# Patient Record
Sex: Male | Born: 1945 | Race: White | Hispanic: No | Smoking: Former smoker
Health system: Southern US, Community
[De-identification: ages and names within clinical notes are randomized; demographics above are authoritative.]

## PROBLEM LIST (undated history)

## (undated) DIAGNOSIS — M109 Gout, unspecified: Secondary | ICD-10-CM

## (undated) DIAGNOSIS — R0600 Dyspnea, unspecified: Secondary | ICD-10-CM

## (undated) DIAGNOSIS — N400 Enlarged prostate without lower urinary tract symptoms: Secondary | ICD-10-CM

## (undated) DIAGNOSIS — I2510 Atherosclerotic heart disease of native coronary artery without angina pectoris: Secondary | ICD-10-CM

## (undated) DIAGNOSIS — I499 Cardiac arrhythmia, unspecified: Secondary | ICD-10-CM

## (undated) DIAGNOSIS — S7290XA Unspecified fracture of unspecified femur, initial encounter for closed fracture: Secondary | ICD-10-CM

## (undated) DIAGNOSIS — Z87442 Personal history of urinary calculi: Secondary | ICD-10-CM

## (undated) DIAGNOSIS — K219 Gastro-esophageal reflux disease without esophagitis: Secondary | ICD-10-CM

## (undated) DIAGNOSIS — M1990 Unspecified osteoarthritis, unspecified site: Secondary | ICD-10-CM

## (undated) DIAGNOSIS — C801 Malignant (primary) neoplasm, unspecified: Secondary | ICD-10-CM

## (undated) DIAGNOSIS — I4891 Unspecified atrial fibrillation: Secondary | ICD-10-CM

## (undated) DIAGNOSIS — L989 Disorder of the skin and subcutaneous tissue, unspecified: Secondary | ICD-10-CM

## (undated) DIAGNOSIS — Z8739 Personal history of other diseases of the musculoskeletal system and connective tissue: Secondary | ICD-10-CM

## (undated) DIAGNOSIS — I4819 Other persistent atrial fibrillation: Secondary | ICD-10-CM

## (undated) DIAGNOSIS — N289 Disorder of kidney and ureter, unspecified: Secondary | ICD-10-CM

## (undated) DIAGNOSIS — I10 Essential (primary) hypertension: Secondary | ICD-10-CM

## (undated) DIAGNOSIS — E039 Hypothyroidism, unspecified: Secondary | ICD-10-CM

## (undated) DIAGNOSIS — T4145XA Adverse effect of unspecified anesthetic, initial encounter: Secondary | ICD-10-CM

## (undated) DIAGNOSIS — T8859XA Other complications of anesthesia, initial encounter: Secondary | ICD-10-CM

## (undated) HISTORY — PX: CERVICAL SPINE SURGERY: SHX589

## (undated) HISTORY — PX: OTHER SURGICAL HISTORY: SHX169

## (undated) HISTORY — PX: FEMUR FRACTURE SURGERY: SHX633

---

## 2006-02-20 ENCOUNTER — Inpatient Hospital Stay (HOSPITAL_COMMUNITY): Admission: AD | Admit: 2006-02-20 | Discharge: 2006-02-21 | Payer: Self-pay | Admitting: Neurosurgery

## 2007-10-25 ENCOUNTER — Ambulatory Visit (HOSPITAL_COMMUNITY): Admission: RE | Admit: 2007-10-25 | Discharge: 2007-10-25 | Payer: Self-pay | Admitting: Neurosurgery

## 2009-12-19 ENCOUNTER — Inpatient Hospital Stay (HOSPITAL_COMMUNITY): Admission: EM | Admit: 2009-12-19 | Discharge: 2009-12-21 | Payer: Self-pay | Admitting: Emergency Medicine

## 2010-07-12 LAB — BASIC METABOLIC PANEL
BUN: 22 mg/dL (ref 6–23)
Calcium: 8.3 mg/dL — ABNORMAL LOW (ref 8.4–10.5)
Calcium: 9.1 mg/dL (ref 8.4–10.5)
Chloride: 106 mEq/L (ref 96–112)
Creatinine, Ser: 0.93 mg/dL (ref 0.4–1.5)
Creatinine, Ser: 0.99 mg/dL (ref 0.4–1.5)
GFR calc Af Amer: >60 mL/min (ref >60)
GFR calc Af Amer: >60 mL/min (ref >60)
GFR calc Af Amer: >60 mL/min (ref >60)
GFR calc non Af Amer: >60 mL/min (ref >60)
GFR calc non Af Amer: >60 mL/min (ref >60)
GFR calc non Af Amer: >60 mL/min (ref >60)
Potassium: 4.4 mEq/L (ref 3.5–5.1)
Potassium: 4.9 mEq/L (ref 3.5–5.1)
Sodium: 137 mEq/L (ref 135–145)
Sodium: 139 mEq/L (ref 135–145)

## 2010-07-12 LAB — PROTIME-INR
INR: 1 (ref 0.00–1.49)
INR: 1.01 (ref 0.00–1.49)
Prothrombin Time: 13.5 seconds (ref 11.6–15.2)

## 2010-07-12 LAB — CBC
HCT: 46.7 % (ref 39.0–52.0)
Hemoglobin: 11.4 g/dL — ABNORMAL LOW (ref 13.0–17.0)
Hemoglobin: 15.9 g/dL (ref 13.0–17.0)
MCV: 102.5 fL — ABNORMAL HIGH (ref 78.0–100.0)
Platelets: 105 10*3/uL — ABNORMAL LOW (ref 150–400)
Platelets: 131 10*3/uL — ABNORMAL LOW (ref 150–400)
RBC: 3.36 MIL/uL — ABNORMAL LOW (ref 4.22–5.81)
RBC: 3.97 MIL/uL — ABNORMAL LOW (ref 4.22–5.81)
RBC: 4.64 MIL/uL (ref 4.22–5.81)
RDW: 14.2 % (ref 11.5–15.5)
WBC: 5.9 10*3/uL (ref 4.0–10.5)
WBC: 7.7 10*3/uL (ref 4.0–10.5)
WBC: 8.2 10*3/uL (ref 4.0–10.5)

## 2010-07-12 LAB — TYPE AND SCREEN: Antibody Screen: NEGATIVE

## 2010-07-12 LAB — DIFFERENTIAL
Basophils Relative: 0 % (ref 0–1)
Lymphocytes Relative: 9 % — ABNORMAL LOW (ref 12–46)
Monocytes Absolute: 0.6 10*3/uL (ref 0.1–1.0)
Monocytes Relative: 7 % (ref 3–12)
Neutro Abs: 6.8 10*3/uL (ref 1.7–7.7)
Neutrophils Relative %: 83 % — ABNORMAL HIGH (ref 43–77)

## 2010-09-13 NOTE — Op Note (Signed)
Wayne Ortiz, Wayne Ortiz                 ACCOUNT NO.:  [id number]   MEDICAL RECORD NO.:  [id number]          PATIENT TYPE:  INP   LOCATION:  3172                         FACILITY:  [facility]   PHYSICIAN:  Coletta Memos, M.D.     DATE OF BIRTH:  [birth year]   DATE OF PROCEDURE:  02/20/2006  DATE OF DISCHARGE:                                 OPERATIVE REPORT   PREOPERATIVE DIAGNOSES:  1. Cervical spondylosis C5-6, C6-7.  2. Cervical subluxation, C7-T1.  3. Foraminal stenosis.   POSTOPERATIVE DIAGNOSES:  1. Cervical spondylosis C5-6, C6-7.  2. Cervical subluxation, C7-T1.  3. Foraminal stenosis.   PROCEDURE:  1. Anterior cervical decompression C5-6, C6-7; anterior cervical      diskectomy, C7-T1.  2. Arthrodesis C5-T1 with three 7-mm PEEK interbody grafts; anterior      instrumentation, C5-T1 using a fracture plate and 14 mm screws, two in      C5, two in C6, two in C7, two in T1.   COMPLICATIONS:  None.   SURGEON:  Coletta Memos, M.D.   ASSISTANT:  Payton Doughty, M.D.   INDICATIONS:  Wayne Ortiz is a gentleman who has severe pain in his back  and in between his shoulder blades and pain in his neck.  He has severe  spondylitic change present at C5-6, C6-7, and C7-T1.  He also has a  subluxation at C3-4.  I recommended and he agreed to undergo operative  decompression and arthrodesis from C5 to T1 where I thought his problem  actually was.  He was also stenotic at the C5-6 and C6-7 levels.  He was not  stenotic at C7-T1.   OPERATIVE NOTE:  Wayne Ortiz was brought to the operating room, intubated, and  placed under general anesthetic without difficulty.  He was positioned  supine on the head rest with his head in slight extension.  His neck was  prepped and he was draped in a sterile fashion.  I infiltrated the skin with  3 cubic centimeters 0.5% lidocaine with 1:200,000 strength epinephrine.  His  neck was prepped and he was draped in a sterile fashion.  I opened the skin  with a #10  blade and took this down to the platysma.  I dissected in a plane  above the platysma.  I opened the platysma horizontally.  I then dissected  in a plane inferior to the platysma rostrally and caudally.  I created an  avascular corridor to the cervical spine and exposed T1, C7, C6, and C5.  I  took an x-ray to show my initial position and I was at C5-6.  I then  reflected the longus colli muscles.  I then placed a self-retaining  retractor and two distraction pins, one at C5, the other at C6.  I then  opened the disk space, distracted it, and, using pituitary rongeurs,  curettes, and a high-speed drill, performed a diskectomy at C5-6.  I did a  great deal of drilling at this level both to enlarge the disk space and to  remove osteophytes posteriorly and in both neuroforamen.  I  did aggressive  decompressions of the C6 nerve roots at this level.  I decompressed the  spinal canal well.   I then prepared the disk space for arthrodesis and drilled the endplates and  even the sides so that they were parallel.  I then placed a 7 mm graft  filled with morselized autograft, PEEK interbody filled with morselized  autograft.  I then turned my attention to C7-T1.  With Dr. Temple Pacini assistance,  we removed the disk.  I did not decompress the spinal canal as I did not  have good visualization of the canal itself secondary to the angle and the  patient's body habitus.  But I was able to remove the disk and prepare the  endplates for arthrodesis, which I did.  I then placed a 7 mm graft into the  disk space.  The biggest problem at C7-T1 was a subluxation.  Frankly, there  was no cord compression at that level.  I then turned my attention to C6-7  and again placed distraction pins, one at C6, one at C7.  I performed a  diskectomy and, just as at C5-6, there was a great deal of arthritic change  present at this level.  I did a good deal of drilling to remove osteophytes  and to enlarge the disk space.  I  removed the disk to decompress the spinal  canal.  I did an aggressive decompression of both C7 nerve roots.  When this  was done, I placed another 7-mm graft filled with morselized autograft.  I  then placed an anterior plate after removing the distraction pins.   Screws were placed at C5, C6, C7, and T1, self- __________  screws at T1,  self-tapping at the other levels.  Each hole for the self-tapping screws was  first drilled then used screws.  As I was unable to see the entire construct  on x-ray, I did not take another one.  I then closed the wound in a layered  fashion after placing a JP drain.  Vicryl sutures were used to reapproximate  the platysma, subcutaneous layer, and subcuticular layers.  Dermabond used  for a sterile dressing.  The JP was secured to the skin with a single tie.           ______________________________  Coletta Memos, M.D.     KC/MEDQ  D:  02/20/2006  T:  02/22/2006  Job:  [id number]

## 2012-06-22 ENCOUNTER — Other Ambulatory Visit (HOSPITAL_COMMUNITY): Payer: Self-pay | Admitting: Family Medicine

## 2012-06-22 DIAGNOSIS — R109 Unspecified abdominal pain: Secondary | ICD-10-CM

## 2012-06-23 ENCOUNTER — Ambulatory Visit (HOSPITAL_COMMUNITY)
Admission: RE | Admit: 2012-06-23 | Discharge: 2012-06-23 | Disposition: A | Discharge status: Home / Self Care | Payer: Medicare Other | Source: Ambulatory Visit | Attending: Family Medicine | Admitting: Family Medicine

## 2012-06-23 DIAGNOSIS — R109 Unspecified abdominal pain: Secondary | ICD-10-CM

## 2012-06-23 DIAGNOSIS — R1011 Right upper quadrant pain: Secondary | ICD-10-CM | POA: Insufficient documentation

## 2012-12-30 ENCOUNTER — Encounter (HOSPITAL_COMMUNITY): Payer: Self-pay | Admitting: *Deleted

## 2012-12-30 ENCOUNTER — Emergency Department (HOSPITAL_COMMUNITY)
Admission: EM | Admit: 2012-12-30 | Discharge: 2012-12-30 | Disposition: A | Discharge status: Home / Self Care | Payer: Medicare Other | Attending: Emergency Medicine | Admitting: Emergency Medicine

## 2012-12-30 ENCOUNTER — Emergency Department (HOSPITAL_COMMUNITY): Payer: Medicare Other

## 2012-12-30 DIAGNOSIS — Z862 Personal history of diseases of the blood and blood-forming organs and certain disorders involving the immune mechanism: Secondary | ICD-10-CM | POA: Insufficient documentation

## 2012-12-30 DIAGNOSIS — R112 Nausea with vomiting, unspecified: Secondary | ICD-10-CM | POA: Insufficient documentation

## 2012-12-30 DIAGNOSIS — I2510 Atherosclerotic heart disease of native coronary artery without angina pectoris: Secondary | ICD-10-CM | POA: Insufficient documentation

## 2012-12-30 DIAGNOSIS — R197 Diarrhea, unspecified: Secondary | ICD-10-CM | POA: Insufficient documentation

## 2012-12-30 DIAGNOSIS — I10 Essential (primary) hypertension: Secondary | ICD-10-CM | POA: Insufficient documentation

## 2012-12-30 DIAGNOSIS — M549 Dorsalgia, unspecified: Secondary | ICD-10-CM | POA: Insufficient documentation

## 2012-12-30 DIAGNOSIS — Z8639 Personal history of other endocrine, nutritional and metabolic disease: Secondary | ICD-10-CM | POA: Insufficient documentation

## 2012-12-30 DIAGNOSIS — R1013 Epigastric pain: Secondary | ICD-10-CM | POA: Insufficient documentation

## 2012-12-30 HISTORY — DX: Essential (primary) hypertension: I10

## 2012-12-30 HISTORY — DX: Gout, unspecified: M10.9

## 2012-12-30 HISTORY — DX: Unspecified fracture of unspecified femur, initial encounter for closed fracture: S72.90XA

## 2012-12-30 LAB — LIPASE, BLOOD: Lipase: 29 U/L (ref 11–59)

## 2012-12-30 LAB — COMPREHENSIVE METABOLIC PANEL
ALT: 31 U/L (ref 0–53)
Alkaline Phosphatase: 73 U/L (ref 39–117)
BUN: 32 mg/dL — ABNORMAL HIGH (ref 6–23)
CO2: 26 mEq/L (ref 19–32)
Calcium: 10 mg/dL (ref 8.4–10.5)
GFR calc Af Amer: 43 mL/min — ABNORMAL LOW (ref >90)
GFR calc non Af Amer: 37 mL/min — ABNORMAL LOW (ref >90)
Glucose, Bld: 124 mg/dL — ABNORMAL HIGH (ref 70–99)
Sodium: 140 mEq/L (ref 135–145)
Total Protein: 7.8 g/dL (ref 6.0–8.3)

## 2012-12-30 LAB — CBC WITH DIFFERENTIAL/PLATELET
Eosinophils Absolute: 0.1 10*3/uL (ref 0.0–0.7)
Eosinophils Relative: 1 % (ref 0–5)
HCT: 48.3 % (ref 39.0–52.0)
Hemoglobin: 16.2 g/dL (ref 13.0–17.0)
Lymphocytes Relative: 22 % (ref 12–46)
Lymphs Abs: 1.4 10*3/uL (ref 0.7–4.0)
MCH: 33.5 pg (ref 26.0–34.0)
MCV: 100 fL (ref 78.0–100.0)
Monocytes Relative: 10 % (ref 3–12)
Platelets: 147 10*3/uL — ABNORMAL LOW (ref 150–400)
RBC: 4.83 MIL/uL (ref 4.22–5.81)
WBC: 6.5 10*3/uL (ref 4.0–10.5)

## 2012-12-30 MED ORDER — ONDANSETRON HCL 4 MG/2ML IJ SOLN
4.0000 mg | Freq: Once | INTRAMUSCULAR | Status: AC
  Administered 2012-12-30: 4 mg via INTRAVENOUS
  Filled 2012-12-30: qty 2

## 2012-12-30 MED ORDER — SODIUM CHLORIDE 0.9 % IV BOLUS (SEPSIS)
1000.0000 mL | Freq: Once | INTRAVENOUS | Status: AC
  Administered 2012-12-30: 1000 mL via INTRAVENOUS

## 2012-12-30 MED ORDER — IOHEXOL 300 MG/ML  SOLN: 50.0000 mL | Freq: Once | INTRAMUSCULAR | Status: DC | PRN

## 2012-12-30 MED ORDER — IOHEXOL 300 MG/ML  SOLN
80.0000 mL | Freq: Once | INTRAMUSCULAR | Status: AC | PRN
  Administered 2012-12-30: 80 mL via INTRAVENOUS

## 2012-12-30 MED ORDER — MORPHINE SULFATE 4 MG/ML IJ SOLN
4.0000 mg | Freq: Once | INTRAMUSCULAR | Status: AC
  Administered 2012-12-30: 4 mg via INTRAVENOUS
  Filled 2012-12-30: qty 1

## 2012-12-30 MED ORDER — IOHEXOL 300 MG/ML  SOLN
50.0000 mL | Freq: Once | INTRAMUSCULAR | Status: AC | PRN
  Administered 2012-12-30: 50 mL via ORAL

## 2012-12-30 MED ORDER — OXYCODONE-ACETAMINOPHEN 5-325 MG PO TABS: 2.0000 | ORAL_TABLET | ORAL | Status: DC | PRN

## 2012-12-30 NOTE — ED Notes (Signed)
Pt with sudden upper abd pain since 1700 with N/V

## 2012-12-30 NOTE — ED Provider Notes (Signed)
CSN: [id number]     Arrival date & time 12/30/12  1946 History  This chart was scribed for Geoffery Lyons, MD by Clydene Laming, ED Scribe and Bennett Scrape, ED Scribe. This patient was seen in room APA18/APA18 and the patient's care was started at 8:10 PM.    Chief Complaint  Patient presents with  . Abdominal Pain    The history is provided by the patient. No language interpreter was used.    HPI Comments: Wayne Ortiz is a 67 y.o. male who presents to the Emergency Department complaining of severe, upper abdominal pain that began at 5:00 PM, radiating to the back causing episodes of associated emesis, nausea, and diarrhea. Pt reports prior episodes of the same, last occuring 2 days ago. He states that they usually resolve with chewing gum; however, this episode did not improve after chewing gum. He reports being evaluated for the same with a negative [place] and blood work up 6 months ago with no diagnosis. He denies urinary symptoms and hematemesis as associated symptoms. He states that he was recently taken off of xarelto 3 weeks ago and currently takes one baby ASA. Pt denies any previous abdominal surgeries. Pt states he had a colonoscopy less than a year ago that was normal. Pt also takes medication for HTN and thyroid disease.   Past Medical History  Diagnosis Date  . Thyroid disease   . Hypertension   . Gout   . Coronary artery disease    Past Surgical History  Procedure Laterality Date  . Fracture surgery    . Cervical spine surgery     History reviewed. No pertinent family history. History  Substance Use Topics  . Smoking status: Never Smoker   . Smokeless tobacco: Not on file  . Alcohol Use: Yes    Review of Systems  Constitutional: Negative for fever.  Gastrointestinal: Positive for nausea, vomiting, abdominal pain and diarrhea. Negative for blood in stool.  Genitourinary: Negative for dysuria, hematuria and difficulty urinating.  Musculoskeletal: Positive for back pain.   All other systems reviewed and are negative.    Allergies  Review of patient's allergies indicates no known allergies.  Home Medications  No current outpatient prescriptions on file.  Triage Vitals: BP 180/90  Pulse 61  Temp(Src) 97.5 F (36.4 C) (Oral)  Resp 22  Ht 5\' 11"  (1.803 m)  Wt 260 lb (117.935 kg)  BMI 36.28 kg/m2  SpO2 100%  Physical Exam  Nursing note and vitals reviewed. Constitutional: He is oriented to person, place, and time. He appears well-developed and well-nourished. No distress.  HENT:  Head: Normocephalic and atraumatic.  Eyes: Conjunctivae and EOM are normal.  Neck: Normal range of motion. Neck supple. No tracheal deviation present.  Cardiovascular: Normal rate, regular rhythm and normal heart sounds.   No murmur heard. Pulmonary/Chest: Effort normal and breath sounds normal. No respiratory distress. He has no wheezes. He has no rales.  Abdominal: Soft. There is tenderness. There is no rebound and no guarding.  Tenderness to palpation in the epigastric region. Bowel sounds present. No rebound or guarding.  Musculoskeletal: Normal range of motion. He exhibits no edema.  Neurological: He is alert and oriented to person, place, and time. No cranial nerve deficit.  Skin: Skin is warm and dry.  Psychiatric: He has a normal mood and affect. His behavior is normal.    ED Course  Procedures (including critical care time)  DIAGNOSTIC STUDIES: Oxygen Saturation is 100% on RA, normal by my interpretation.  COORDINATION OF CARE: 8:10PM- Discussed treatment plan with pt at bedside. Pt verbalized understanding and agreement with plan.   Labs Review Labs Reviewed - No data to display Imaging Review No results found.   Date: 12/30/2012  Rate: 56  Rhythm: sinus bradycardia with pvcs  QRS Axis: normal  Intervals: normal  ST/T Wave abnormalities: normal  Conduction Disutrbances:none  Narrative Interpretation:   Old EKG Reviewed: none  available    MDM  No diagnosis found. Patient presents here with complaints of epigastric discomfort that started at about 5:00 this evening. He has had some nausea and vomiting associated with it. His exam reveals only mild tenderness to palpation in the epigastrium is a otherwise benign. Workup in the emergency department consists of EKG and troponin, both were not suggestive of a cardiac etiology. Laboratory studies revealed no elevation of white count and electrolyte panel which was essentially unremarkable. CT scan of the abdomen and pelvis revealed no acute intra-abdominal pathology. He is feeling better with medications in the ER. He will be discharged to home with pain medication and when necessary followup.  I personally performed the services described in this documentation, which was scribed in my presence. The recorded information has been reviewed and is accurate.     Geoffery Lyons, MD 12/30/12 [phone]

## 2012-12-30 NOTE — ED Notes (Signed)
CT called and aware of pt's oral contrast has been finished

## 2012-12-30 NOTE — ED Notes (Signed)
NVD since 5 pm.  With abd pain, similar episode pm Tuesday. Pain in back also.

## 2012-12-31 ENCOUNTER — Emergency Department (HOSPITAL_COMMUNITY): Payer: Medicare Other

## 2012-12-31 ENCOUNTER — Observation Stay (HOSPITAL_COMMUNITY)
Admission: EM | Admit: 2012-12-31 | Discharge: 2013-01-03 | Disposition: A | Discharge status: Home / Self Care | Payer: Medicare Other | Attending: Family Medicine | Admitting: Family Medicine

## 2012-12-31 ENCOUNTER — Encounter (HOSPITAL_COMMUNITY): Payer: Self-pay | Admitting: Emergency Medicine

## 2012-12-31 DIAGNOSIS — Z7982 Long term (current) use of aspirin: Secondary | ICD-10-CM | POA: Insufficient documentation

## 2012-12-31 DIAGNOSIS — Z79899 Other long term (current) drug therapy: Secondary | ICD-10-CM | POA: Insufficient documentation

## 2012-12-31 DIAGNOSIS — R748 Abnormal levels of other serum enzymes: Secondary | ICD-10-CM

## 2012-12-31 DIAGNOSIS — K7689 Other specified diseases of liver: Secondary | ICD-10-CM | POA: Insufficient documentation

## 2012-12-31 DIAGNOSIS — E039 Hypothyroidism, unspecified: Secondary | ICD-10-CM | POA: Insufficient documentation

## 2012-12-31 DIAGNOSIS — K8066 Calculus of gallbladder and bile duct with acute and chronic cholecystitis without obstruction: Principal | ICD-10-CM | POA: Insufficient documentation

## 2012-12-31 DIAGNOSIS — R7401 Elevation of levels of liver transaminase levels: Secondary | ICD-10-CM

## 2012-12-31 DIAGNOSIS — R109 Unspecified abdominal pain: Secondary | ICD-10-CM

## 2012-12-31 DIAGNOSIS — I4891 Unspecified atrial fibrillation: Secondary | ICD-10-CM | POA: Insufficient documentation

## 2012-12-31 DIAGNOSIS — R17 Unspecified jaundice: Secondary | ICD-10-CM | POA: Insufficient documentation

## 2012-12-31 DIAGNOSIS — I10 Essential (primary) hypertension: Secondary | ICD-10-CM | POA: Insufficient documentation

## 2012-12-31 DIAGNOSIS — K821 Hydrops of gallbladder: Secondary | ICD-10-CM | POA: Insufficient documentation

## 2012-12-31 DIAGNOSIS — M109 Gout, unspecified: Secondary | ICD-10-CM | POA: Insufficient documentation

## 2012-12-31 DIAGNOSIS — K8309 Other cholangitis: Secondary | ICD-10-CM | POA: Insufficient documentation

## 2012-12-31 DIAGNOSIS — D696 Thrombocytopenia, unspecified: Secondary | ICD-10-CM | POA: Insufficient documentation

## 2012-12-31 DIAGNOSIS — R7402 Elevation of levels of lactic acid dehydrogenase (LDH): Secondary | ICD-10-CM | POA: Insufficient documentation

## 2012-12-31 DIAGNOSIS — K2960 Other gastritis without bleeding: Secondary | ICD-10-CM | POA: Insufficient documentation

## 2012-12-31 DIAGNOSIS — R1011 Right upper quadrant pain: Secondary | ICD-10-CM

## 2012-12-31 DIAGNOSIS — Z87442 Personal history of urinary calculi: Secondary | ICD-10-CM | POA: Insufficient documentation

## 2012-12-31 HISTORY — DX: Unspecified atrial fibrillation: I48.91

## 2012-12-31 LAB — URINALYSIS, ROUTINE W REFLEX MICROSCOPIC
Ketones, ur: NEGATIVE mg/dL
Leukocytes, UA: NEGATIVE
Nitrite: NEGATIVE
Specific Gravity, Urine: 1.02 (ref 1.005–1.030)
pH: 6 (ref 5.0–8.0)

## 2012-12-31 LAB — COMPREHENSIVE METABOLIC PANEL
Albumin: 4.4 g/dL (ref 3.5–5.2)
BUN: 29 mg/dL — ABNORMAL HIGH (ref 6–23)
Calcium: 9.4 mg/dL (ref 8.4–10.5)
GFR calc Af Amer: 72 mL/min — ABNORMAL LOW (ref >90)
Glucose, Bld: 163 mg/dL — ABNORMAL HIGH (ref 70–99)
Total Protein: 7.5 g/dL (ref 6.0–8.3)

## 2012-12-31 LAB — TROPONIN I: Troponin I: <0.3 ng/mL (ref <0.30)

## 2012-12-31 LAB — CBC WITH DIFFERENTIAL/PLATELET
Basophils Absolute: 0 10*3/uL (ref 0.0–0.1)
HCT: 45.9 % (ref 39.0–52.0)
Hemoglobin: 15.9 g/dL (ref 13.0–17.0)
Lymphocytes Relative: 9 % — ABNORMAL LOW (ref 12–46)
Lymphs Abs: 0.7 10*3/uL (ref 0.7–4.0)
MCV: 98.7 fL (ref 78.0–100.0)
Monocytes Absolute: 0.6 10*3/uL (ref 0.1–1.0)
Monocytes Relative: 8 % (ref 3–12)
Neutro Abs: 6.5 10*3/uL (ref 1.7–7.7)
RBC: 4.65 MIL/uL (ref 4.22–5.81)
WBC: 7.8 10*3/uL (ref 4.0–10.5)

## 2012-12-31 LAB — URINE MICROSCOPIC-ADD ON

## 2012-12-31 MED ORDER — PANTOPRAZOLE SODIUM 40 MG IV SOLR
40.0000 mg | Freq: Once | INTRAVENOUS | Status: AC
  Administered 2012-12-31: 40 mg via INTRAVENOUS
  Filled 2012-12-31: qty 40

## 2012-12-31 MED ORDER — ONDANSETRON HCL 4 MG PO TABS: 4.0000 mg | ORAL_TABLET | Freq: Four times a day (QID) | ORAL | Status: DC | PRN

## 2012-12-31 MED ORDER — ALLOPURINOL 300 MG PO TABS
300.0000 mg | ORAL_TABLET | Freq: Every day | ORAL | Status: DC
  Administered 2012-12-31 – 2013-01-03 (×4): 300 mg via ORAL
  Filled 2012-12-31 (×4): qty 1

## 2012-12-31 MED ORDER — ACETAMINOPHEN 325 MG PO TABS
650.0000 mg | ORAL_TABLET | Freq: Four times a day (QID) | ORAL | Status: DC | PRN
  Administered 2013-01-01 – 2013-01-02 (×2): 650 mg via ORAL
  Filled 2012-12-31 (×2): qty 2

## 2012-12-31 MED ORDER — HYDROMORPHONE HCL PF 1 MG/ML IJ SOLN
1.0000 mg | Freq: Once | INTRAMUSCULAR | Status: AC
  Administered 2012-12-31: 1 mg via INTRAVENOUS
  Filled 2012-12-31: qty 1

## 2012-12-31 MED ORDER — ONDANSETRON HCL 4 MG/2ML IJ SOLN: 4.0000 mg | Freq: Four times a day (QID) | INTRAMUSCULAR | Status: DC | PRN

## 2012-12-31 MED ORDER — ONDANSETRON HCL 4 MG/2ML IJ SOLN: 4.0000 mg | Freq: Three times a day (TID) | INTRAMUSCULAR | Status: DC | PRN

## 2012-12-31 MED ORDER — ASPIRIN EC 81 MG PO TBEC
81.0000 mg | DELAYED_RELEASE_TABLET | Freq: Every morning | ORAL | Status: DC
  Administered 2012-12-31 – 2013-01-01 (×2): 81 mg via ORAL
  Filled 2012-12-31 (×2): qty 1

## 2012-12-31 MED ORDER — LEVOTHYROXINE SODIUM 137 MCG PO TABS
137.0000 ug | ORAL_TABLET | Freq: Every day | ORAL | Status: DC
  Administered 2013-01-01 – 2013-01-03 (×3): 137 ug via ORAL
  Filled 2012-12-31 (×5): qty 1

## 2012-12-31 MED ORDER — HYDROMORPHONE HCL PF 1 MG/ML IJ SOLN
0.5000 mg | INTRAMUSCULAR | Status: DC | PRN
  Administered 2012-12-31: 0.5 mg via INTRAVENOUS
  Filled 2012-12-31: qty 1

## 2012-12-31 MED ORDER — ONDANSETRON HCL 4 MG/2ML IJ SOLN
4.0000 mg | Freq: Once | INTRAMUSCULAR | Status: AC
  Administered 2012-12-31: 4 mg via INTRAVENOUS
  Filled 2012-12-31: qty 2

## 2012-12-31 MED ORDER — HYDROCODONE-ACETAMINOPHEN 5-325 MG PO TABS
1.0000 | ORAL_TABLET | ORAL | Status: DC | PRN
  Administered 2012-12-31: 2 via ORAL
  Filled 2012-12-31 (×2): qty 2

## 2012-12-31 MED ORDER — SODIUM CHLORIDE 0.9 % IV SOLN
Freq: Once | INTRAVENOUS | Status: AC
  Administered 2012-12-31: 20 mL/h via INTRAVENOUS

## 2012-12-31 MED ORDER — AMIODARONE HCL 200 MG PO TABS
100.0000 mg | ORAL_TABLET | Freq: Every day | ORAL | Status: DC
  Administered 2012-12-31 – 2013-01-03 (×4): 100 mg via ORAL
  Filled 2012-12-31: qty 2
  Filled 2012-12-31 (×3): qty 1

## 2012-12-31 MED ORDER — ENOXAPARIN SODIUM 40 MG/0.4ML ~~LOC~~ SOLN: 40.0000 mg | SUBCUTANEOUS | Status: DC

## 2012-12-31 MED ORDER — HYDROMORPHONE HCL PF 1 MG/ML IJ SOLN: 1.0000 mg | INTRAMUSCULAR | Status: DC | PRN

## 2012-12-31 MED ORDER — LISINOPRIL 10 MG PO TABS
20.0000 mg | ORAL_TABLET | Freq: Every day | ORAL | Status: DC
  Administered 2012-12-31 – 2013-01-03 (×4): 20 mg via ORAL
  Filled 2012-12-31 (×4): qty 2

## 2012-12-31 MED ORDER — ACETAMINOPHEN 650 MG RE SUPP: 650.0000 mg | Freq: Four times a day (QID) | RECTAL | Status: DC | PRN

## 2012-12-31 NOTE — ED Provider Notes (Signed)
0815 AM. Pt reexamined - no pain on my exam, LFT's elevated, GB wall mildly thickened.  D/w Dr. Lovell Sheehan who will come to see pt.    Vida Roller, MD 12/31/12 [phone]

## 2012-12-31 NOTE — ED Notes (Signed)
Intermittant pain in back and radiating in abdomen.  Return visit - here on 12/30/12 pm

## 2012-12-31 NOTE — H&P (Signed)
History and Physical  Wayne Ortiz WUJ:[id number] DOB: 1946/03/16 DOA: 12/31/2012  Referring physician: Eber Hong, MD PCP: Alice Reichert, MD   Chief Complaint: Abdominal pain  HPI:  67 year old man presented to the emergency department with abdominal pain, initial evaluation revealed elevated transaminases and history suggested gallbladder pathology but imaging was inconclusive. Patient was admitted for observation given recurrent pain in surgical consultation.  History obtained from patient and wife at bedside. In the past patient had one "gallbladder attack" which was manifested by pain in his mid back and right upper quadrant. This was a while ago. He was seen in the emergency Department 9/4 and again 9/5 for right upper quadrant pain. This pain began suddenly and was most severe pain he has ever experienced, worse than his femur fracture. No specific aggravating or alleviating factors were described. Pain is located in his right upper quadrant and mid back. No CVA tenderness. No suprapubic pain. He has had kidney stones in the past but these are quite different for him. He did have episode of vomiting at home. Because of severe pain he came back to the emergency department.  In the emergency department and noted to be afebrile with stable vital signs. Chemistry  panel unremarkable except for elevated transaminases. CBC notable for chronic thrombocytopenia. No leukocytosis. right upper quadrant ultrasound was inconclusive, mildly thickened gallbladder wall. No gallstones. Recent CT of the abdomen and pelvis was unremarkable.  Review of Systems:  Negative for fever, visual changes, sore throat, rash, new muscle aches, chest pain, SOB, dysuria, bleeding.  Past Medical History  Diagnosis Date  . Thyroid disease   . Hypertension   . Gout   . Femur fracture   . Atrial fibrillation     Past Surgical History  Procedure Laterality Date  . Cervical spine surgery    . Fracture surgery     left arm  . Femur fracture surgery      Social History:  reports that he has never smoked. He does not have any smokeless tobacco history on file. He reports that  drinks alcohol. He reports that he does not use illicit drugs.  No Known Allergies  Family History  Problem Relation Age of Onset  . Kidney Stones Mother      Prior to Admission medications   Medication Sig Start Date End Date Taking? Authorizing Provider  allopurinol (ZYLOPRIM) 300 MG tablet Take 300 mg by mouth daily.   Yes Historical Provider, MD  alum & mag hydroxide-simeth (MYLANTA) 200-200-20 MG/5ML suspension Take 15 mLs by mouth once as needed for indigestion.   Yes Historical Provider, MD  amiodarone (PACERONE) 100 MG tablet Take 100 mg by mouth daily.   Yes Historical Provider, MD  aspirin EC 81 MG tablet Take 81 mg by mouth every morning.   Yes Historical Provider, MD  diclofenac (VOLTAREN) 75 MG EC tablet Take 75 mg by mouth daily.    Yes Historical Provider, MD  levothyroxine (SYNTHROID, LEVOTHROID) 137 MCG tablet Take 137 mcg by mouth daily before breakfast.   Yes Historical Provider, MD  lisinopril (PRINIVIL,ZESTRIL) 20 MG tablet Take 20 mg by mouth daily.   Yes Historical Provider, MD  naproxen sodium (ALEVE) 220 MG tablet Take 220 mg by mouth 2 (two) times daily.   Yes Historical Provider, MD  oxyCODONE-acetaminophen (PERCOCET) 5-325 MG per tablet Take 2 tablets by mouth every 4 (four) hours as needed for pain. 12/30/12   Geoffery Lyons, MD   Physical Exam: Filed Vitals:   12/31/12  0510 12/31/12 0530 12/31/12 0606 12/31/12 1101  BP: 141/92 114/88 127/78 101/61  Pulse: 66 85  88  Temp:    97.5 F (36.4 C)  TempSrc:    Oral  Resp:    19  Height:    5\' 11"  (1.803 m)  Weight:    119.3 kg (263 lb 0.1 oz)  SpO2: 94% 93% 93% 98%   General:  Appears calm and comfortable Eyes: PERRL, normal lids, irises  ENT: grossly normal hearing, lips & tongue Neck: no LAD, masses or thyromegaly Cardiovascular: RRR, no  m/r/g. No LE edema. Respiratory: CTA bilaterally, no w/r/r. Normal respiratory effort. Abdomen: soft, ntnd. No right upper quadrant pain. No epigastric pain. Skin: no rash or induration seen  Musculoskeletal: grossly normal tone BUE/BLE Psychiatric: grossly normal mood and affect, speech fluent and appropriate Neurologic: grossly non-focal.  Wt Readings from Last 3 Encounters:  12/31/12 119.3 kg (263 lb 0.1 oz)  12/30/12 117.935 kg (260 lb)    Labs on Admission:  Basic Metabolic Panel:  Recent Labs Lab 12/30/12 2026 12/31/12 0438  NA 140 136  K 3.9 4.3  CL 102 101  CO2 26 24  GLUCOSE 124* 163*  BUN 32* 29*  CREATININE 1.81* 1.17  CALCIUM 10.0 9.4    Liver Function Tests:  Recent Labs Lab 12/30/12 2026 12/31/12 0438  AST 25 136*  ALT 31 77*  ALKPHOS 73 84  BILITOT 0.4 1.1  PROT 7.8 7.5  ALBUMIN 4.8 4.4    Recent Labs Lab 12/30/12 2026 12/31/12 0438  LIPASE 29 16   CBC:  Recent Labs Lab 12/30/12 2026 12/31/12 0438  WBC 6.5 7.8  NEUTROABS 4.4 6.5  HGB 16.2 15.9  HCT 48.3 45.9  MCV 100.0 98.7  PLT 147* 129*    Cardiac Enzymes:  Recent Labs Lab 12/30/12 2026 12/31/12 0438  TROPONINI <0.30 <0.30   Radiological Exams on Admission: Ct Abdomen Pelvis W Contrast  12/30/2012   *RADIOLOGY REPORT*  Clinical Data: Severe epigastric pain  CT ABDOMEN AND PELVIS WITH CONTRAST  Technique:  Multidetector CT imaging of the abdomen and pelvis was performed following the standard protocol during bolus administration of intravenous contrast.  Contrast: 80mL OMNIPAQUE IOHEXOL 300 MG/ML  SOLN  Comparison: Abdominal ultrasound 06/23/2012  Findings: Lung bases:  There is streaky atelectasis in the right lower lobe and lingula.  Abdomen/pelvis:  3.5 cm simple cyst right lobe of the liver.  7 mm circumscribed low density lesion in the gallbladder fossa that is too small to characterize but likely a cyst.  8 mm low density lesion in the left hepatic lobe is too small to  characterize but likely a cyst.  The gallbladder, spleen, common bile duct, adrenal glands, and pancreas are within normal limits.  10 mm cyst upper pole left kidney.  Right kidney normal.  Negative for hydronephrosis bilaterally.  The ureters are normal in caliber bilaterally.  Stomach well distended with oral contrast and normal.  Small bowel loops are normal in caliber and wall thickness, with the distal small bowel not opacified with contrast at the time imaging. Terminal ileum unremarkable.  Normal appendix seen on coronal image number 64. Normal caliber colon.  Mild sigmoid colon diverticulosis, uncomplicated. No mesenteric inflammatory changes.  Normal urinary bladder.  Mild prostamegaly.  Negative for lymphadenopathy or ascites.  Atherosclerotic calcification and peripheral mural plaque involving the normal caliber abdominal aorta.  Atherosclerotic calcification involves the common and internal iliac arteries bilaterally. Inguinal regions normal.  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  Intramedullary rod and proximal screw noted in the visible imaged portion of the proximal left femur.  No acute fracture.  IMPRESSION:  1.  No explanation for patient's pain is identified. 2.  3.5 cm right hepatic lobe cyst with several scattered small low- density lesions in the liver (too small to characterize but likely cysts). 3.  Prostamegaly.  Consider correlation with PSA values. 4.  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  disease of the lumbar spine.   Original Report Authenticated By: Britta Mccreedy, M.D.   Dg Chest Portable 1 View  12/31/2012   *RADIOLOGY REPORT*  Clinical Data: Back pain.  PORTABLE CHEST - 1 VIEW  Comparison: 12/19/2009.  Findings: Chronic cardiopericardial enlargement and widening of the upper mediastinum.  Markings are prominent at the right base, atelectasis by  preceding CT.  No edema, focal infiltrate, effusion, pneumothorax.  Lower cervical and upper thoracic ACDF with ventral plate screw fixation.  IMPRESSION: 1. No evidence of acute cardiopulmonary disease.  2. Mild atelectasis at the right base.   Original Report Authenticated By: Tiburcio Pea   US Abdomen Limited Ruq  12/31/2012   *RADIOLOGY REPORT*  Clinical Data:  Right upper quadrant pain  LIMITED ABDOMINAL ULTRASOUND - RIGHT UPPER QUADRANT  Comparison:  None.  Findings:  Gallbladder:  Negative for gallstones.  Gallbladder wall is mildly thickened at 6.1 mm.  Negative sonographic Murphy's sign.  Common bile duct:  4 mm  Liver:  Echogenic liver compatible fatty infiltration.  Hypoechoic ill-defined area left lobe compatible with  fatty sparing.  4 cm cyst in the right lobe of the liver.  IMPRESSION: Negative for gallstones.  Gallbladder wall appears mildly thickened however the patient is not tender over the gallbladder.  Fatty infiltration of the liver.                    Original Report Authenticated By: Janeece Riggers, M.D.      Principal Problem:   Abdominal pain, right upper quadrant Active Problems:   Abnormal transaminases   Thrombocytopenia, unspecified   Assessment/Plan 1. Acute right upper quadrant abdominal pain: With radiation to the back. Mild elevation of transaminases, symptomology suggests gallbladder pathology but imaging inconclusive. Unfortunately he has already eaten at lunch and therefore cannot obtain HIDA today. Currently he is pain free. Will admit for pain control, laboratory monitoring. Gen. surgical consultation appreciated. Patient has a history of kidney stones and this feels quite different to him and his symptomology does not suggest kidney stone. Recent CT did not demonstrate any acute abnormalities. 2. Elevated transaminases: Plan as above. 3. Mild thrombocytopenia: This dates back to at least 11/2009. Followup as an outpatient.  4. Prostamegaly: Consider outpatient  Evaluation. 5. Hypothyroidism 6. Hypertension 7. Gout 8. History of atrial fibrillation: Continue Pacerone.   Code Status: Full code  DVT prophylaxis:Lovenox  Family Communication: Discuss with his wife at bedside  Disposition Plan/Anticipated LOS: Observation, one to 2 days.  Time spent: 50  minutes  Brendia Sacks, MD  [facility] Pager [phone] 12/31/2012, 2:06 PM

## 2012-12-31 NOTE — ED Provider Notes (Signed)
CSN: [id number]     Arrival date & time 12/31/12  0415 History   First MD Initiated Contact with Patient 12/31/12 0421     Chief Complaint  Patient presents with  . Back Pain    Patient is a 67 y.o. male presenting with abdominal pain. The history is provided by the patient and a relative.  Abdominal Pain Pain location:  Epigastric and RUQ Pain quality: burning   Pain severity:  Severe Onset quality:  Gradual Duration:  12 hours Timing:  Intermittent Progression:  Worsening Chronicity:  New Relieved by:  Nothing Worsened by:  Nothing tried Associated symptoms: nausea, shortness of breath and vomiting   Associated symptoms: no chest pain, no dysuria, no fever, no hematemesis and no hematochezia   PT reports that he had onset of abdominal pain and back pain over 12 hrs ago.  He was seen in the ED and had some improvement but now it has returned He reports he had similar episode two days ago that resolved He reports he did have vomiting/diarrhea, but now he reports he feels constipated No CP reported but reports SOB due to pain in his abdomen He reports the pain in his abdomen seems to radiate into his back.   Past Medical History  Diagnosis Date  . Thyroid disease   . Hypertension   . Gout   . Coronary artery disease   . Femur fracture    Past Surgical History  Procedure Laterality Date  . Cervical spine surgery    . Fracture surgery      left arm   No family history on file. History  Substance Use Topics  . Smoking status: Never Smoker   . Smokeless tobacco: Not on file  . Alcohol Use: Yes    Review of Systems  Constitutional: Negative for fever.  Respiratory: Positive for shortness of breath.   Cardiovascular: Negative for chest pain.  Gastrointestinal: Positive for nausea, vomiting and abdominal pain. Negative for blood in stool, hematochezia and hematemesis.  Genitourinary: Negative for dysuria.  Musculoskeletal: Positive for back pain.  Neurological:  Negative for weakness.  All other systems reviewed and are negative.    Allergies  Review of patient's allergies indicates no known allergies.  Home Medications   Current Outpatient Rx  Name  Route  Sig  Dispense  Refill  . allopurinol (ZYLOPRIM) 300 MG tablet   Oral   Take 300 mg by mouth daily.         [name] alum & mag hydroxide-simeth (MYLANTA) 200-200-20 MG/5ML suspension   Oral   Take 15 mLs by mouth once as needed for indigestion.         [name] amiodarone (PACERONE) 100 MG tablet   Oral   Take 100 mg by mouth daily.         [name] aspirin EC 81 MG tablet   Oral   Take 81 mg by mouth every morning.         . diclofenac (VOLTAREN) 75 MG EC tablet   Oral   Take 75 mg by mouth daily.          [name] levothyroxine (SYNTHROID, LEVOTHROID) 137 MCG tablet   Oral   Take 137 mcg by mouth daily before breakfast.         . lisinopril (PRINIVIL,ZESTRIL) 20 MG tablet   Oral   Take 20 mg by mouth daily.         . naproxen sodium (ALEVE) 220 MG tablet   Oral  Take 220 mg by mouth 2 (two) times daily.         [name] oxyCODONE-acetaminophen (PERCOCET) 5-325 MG per tablet   Oral   Take 2 tablets by mouth every 4 (four) hours as needed for pain.   12 tablet   0    BP 165/90  Pulse 64  Temp(Src) 98.2 F (36.8 C) (Oral)  Resp 18  Ht 5\' 11"  (1.803 m)  Wt 260 lb (117.935 kg)  BMI 36.28 kg/m2  SpO2 96% Physical Exam CONSTITUTIONAL: Well developed/well nourished, appears uncomfortable HEAD: Normocephalic/atraumatic EYES: EOMI/PERRL ENMT: Mucous membranes moist NECK: supple no meningeal signs SPINE:entire spine nontender CV: S1/S2 noted, no murmurs/rubs/gallops noted Chest - he is tender to palpation in right posterior ribs, no bruising or crepitance noted LUNGS: Lungs are clear to auscultation bilaterally, no apparent distress ABDOMEN: soft, mild RUQ tenderness, no rebound or guarding GU:no cva tenderness NEURO: Pt is awake/alert, moves all extremitiesx4 EXTREMITIES:  pulses normal, full ROM SKIN: warm, color normal PSYCH: mildly anxious  ED Course  Procedures  Labs Review Labs Reviewed  CBC WITH DIFFERENTIAL - Abnormal; Notable for the following:    MCH 34.2 (*)    Platelets 129 (*)    Neutrophils Relative % 83 (*)    Lymphocytes Relative 9 (*)    All other components within normal limits  COMPREHENSIVE METABOLIC PANEL  LIPASE, BLOOD  LACTIC ACID, PLASMA  TROPONIN I  URINALYSIS, ROUTINE W REFLEX MICROSCOPIC   Imaging Review 4:57 AM Pt presents after being seen in the ED on 9/4.  He reports his pain is worsening.  He is uncomfortable appearing.  He has focal tenderness to RUQ.  He already had CT imaging that was negative.  Given worsening pain, will repeat labs as well as obtain CXR due to his shortness of breath.  Will treat pain.  Will follow closely 5:39 AM Pt reports his pain has improved He appears comfortable However, his LFTs are worsening, and his pain was localized over RUQ Will obtain US imaging of his gallbladder.  After discussion with radiology, cholelithiasis can be hidden on CT imaging, and his presentation could represent cholelithiasis 6:48 AM Plan is to f/u on ultrasound imaging.  If negative but still with RUQ pain, may need further imaging/admission MDM  No diagnosis found. Nursing notes including past medical history and social history reviewed and considered in documentation xrays reviewed and considered Labs/vital reviewed and considered Previous records reviewed and considered - recent ED visit reviewed    Date: 12/31/2012 0445am  Rate: 70  Rhythm: normal sinus rhythm  QRS Axis: normal  Intervals: normal  ST/T Wave abnormalities: nonspecific ST changes  Conduction Disutrbances:none  Narrative Interpretation:   Old EKG Reviewed: unchanged from 12/30/12        Joya Gaskins, MD 12/31/12 [phone]

## 2012-12-31 NOTE — Consult Note (Signed)
Reason for Consult: Abdominal pain Referring Physician: Emergency room  Wayne Ortiz is an 67 y.o. male.  HPI: Patient is a 67 year old white male who has presented emergency room twice over the last 24 hours with upper abdominal pain and nausea. He states that the pain usually starts in his back and comes forward to the abdomen. This has been occurring in the past, usually relieved with Motrin he denies any fever, chills, or jaundice. His CT scan the abdomen last night was for the most part unremarkable. Liver enzyme tests and white blood cell count within normal limits. Today, his creatinine has bumped up and he has abnormal LFTs. His total bilirubin has also gone from 0.4-1.1. He currently has no abdominal pain. Ultrasound the gallbladder was negative for stones, with an upper limit of normal gallbladder wall thickness. Common bile duct is within normal limits.  Past Medical History  Diagnosis Date  . Thyroid disease   . Hypertension   . Gout   . Coronary artery disease   . Femur fracture     Past Surgical History  Procedure Laterality Date  . Cervical spine surgery    . Fracture surgery      left arm    No family history on file.  Social History:  reports that he has never smoked. He does not have any smokeless tobacco history on file. He reports that  drinks alcohol. He reports that he does not use illicit drugs.  Allergies: No Known Allergies  Medications: I have reviewed the patient's current medications.  Results for orders placed during the hospital encounter of 12/31/12 (from the past 48 hour(s))  COMPREHENSIVE METABOLIC PANEL     Status: Abnormal   Collection Time    12/31/12  4:38 AM      Result Value Range   Sodium 136  135 - 145 mEq/L   Potassium 4.3  3.5 - 5.1 mEq/L   Chloride 101  96 - 112 mEq/L   CO2 24  19 - 32 mEq/L   Glucose, Bld 163 (*) 70 - 99 mg/dL   BUN 29 (*) 6 - 23 mg/dL   Creatinine, Ser 9.60  0.50 - 1.35 mg/dL   Comment: DELTA CHECK NOTED    Calcium 9.4  8.4 - 10.5 mg/dL   Total Protein 7.5  6.0 - 8.3 g/dL   Albumin 4.4  3.5 - 5.2 g/dL   AST 454 (*) 0 - 37 U/L   ALT 77 (*) 0 - 53 U/L   Alkaline Phosphatase 84  39 - 117 U/L   Total Bilirubin 1.1  0.3 - 1.2 mg/dL   GFR calc non Af Amer 62 (*) >90 mL/min   GFR calc Af Amer 72 (*) >90 mL/min   Comment: (NOTE)     The eGFR has been calculated using the CKD EPI equation.     This calculation has not been validated in all clinical situations.     eGFR's persistently <90 mL/min signify possible Chronic Kidney     Disease.  CBC WITH DIFFERENTIAL     Status: Abnormal   Collection Time    12/31/12  4:38 AM      Result Value Range   WBC 7.8  4.0 - 10.5 K/uL   RBC 4.65  4.22 - 5.81 MIL/uL   Hemoglobin 15.9  13.0 - 17.0 g/dL   HCT 09.8  11.9 - 14.7 %   MCV 98.7  78.0 - 100.0 fL   MCH 34.2 (*) 26.0 - 34.0  pg   MCHC 34.6  30.0 - 36.0 g/dL   RDW 16.1  09.6 - 04.5 %   Platelets 129 (*) 150 - 400 K/uL   Neutrophils Relative % 83 (*) 43 - 77 %   Neutro Abs 6.5  1.7 - 7.7 K/uL   Lymphocytes Relative 9 (*) 12 - 46 %   Lymphs Abs 0.7  0.7 - 4.0 K/uL   Monocytes Relative 8  3 - 12 %   Monocytes Absolute 0.6  0.1 - 1.0 K/uL   Eosinophils Relative 0  0 - 5 %   Eosinophils Absolute 0.0  0.0 - 0.7 K/uL   Basophils Relative 0  0 - 1 %   Basophils Absolute 0.0  0.0 - 0.1 K/uL  LIPASE, BLOOD     Status: None   Collection Time    12/31/12  4:38 AM      Result Value Range   Lipase 16  11 - 59 U/L  LACTIC ACID, PLASMA     Status: None   Collection Time    12/31/12  4:38 AM      Result Value Range   Lactic Acid, Venous 1.5  0.5 - 2.2 mmol/L  TROPONIN I     Status: None   Collection Time    12/31/12  4:38 AM      Result Value Range   Troponin I <0.30  <0.30 ng/mL   Comment:            Due to the release kinetics of cTnI,     a negative result within the first hours     of the onset of symptoms does not rule out     myocardial infarction with certainty.     If myocardial infarction  is still suspected,     repeat the test at appropriate intervals.  URINALYSIS, ROUTINE W REFLEX MICROSCOPIC     Status: Abnormal   Collection Time    12/31/12  5:32 AM      Result Value Range   Color, Urine YELLOW  YELLOW   APPearance CLEAR  CLEAR   Specific Gravity, Urine 1.020  1.005 - 1.030   pH 6.0  5.0 - 8.0   Glucose, UA NEGATIVE  NEGATIVE mg/dL   Hgb urine dipstick TRACE (*) NEGATIVE   Bilirubin Urine NEGATIVE  NEGATIVE   Ketones, ur NEGATIVE  NEGATIVE mg/dL   Protein, ur TRACE (*) NEGATIVE mg/dL   Urobilinogen, UA 0.2  0.0 - 1.0 mg/dL   Nitrite NEGATIVE  NEGATIVE   Leukocytes, UA NEGATIVE  NEGATIVE  URINE MICROSCOPIC-ADD ON     Status: None   Collection Time    12/31/12  5:32 AM      Result Value Range   Squamous Epithelial / LPF RARE  RARE   RBC / HPF 0-2  <3 RBC/hpf    Ct Abdomen Pelvis W Contrast  12/30/2012   *RADIOLOGY REPORT*  Clinical Data: Severe epigastric pain  CT ABDOMEN AND PELVIS WITH CONTRAST  Technique:  Multidetector CT imaging of the abdomen and pelvis was performed following the standard protocol during bolus administration of intravenous contrast.  Contrast: 80mL OMNIPAQUE IOHEXOL 300 MG/ML  SOLN  Comparison: Abdominal ultrasound 06/23/2012  Findings: Lung bases:  There is streaky atelectasis in the right lower lobe and lingula.  Abdomen/pelvis:  3.5 cm simple cyst right lobe of the liver.  7 mm circumscribed low density lesion in the gallbladder fossa that is too small to characterize but likely a cyst.  8 mm low density lesion in the left hepatic lobe is too small to characterize but likely a cyst.  The gallbladder, spleen, common bile duct, adrenal glands, and pancreas are within normal limits.  10 mm cyst upper pole left kidney.  Right kidney normal.  Negative for hydronephrosis bilaterally.  The ureters are normal in caliber bilaterally.  Stomach well distended with oral contrast and normal.  Small bowel loops are normal in caliber and wall thickness, with  the distal small bowel not opacified with contrast at the time imaging. Terminal ileum unremarkable.  Normal appendix seen on coronal image number 64. Normal caliber colon.  Mild sigmoid colon diverticulosis, uncomplicated. No mesenteric inflammatory changes.  Normal urinary bladder.  Mild prostamegaly.  Negative for lymphadenopathy or ascites.  Atherosclerotic calcification and peripheral mural plaque involving the normal caliber abdominal aorta.  Atherosclerotic calcification involves the common and internal iliac arteries bilaterally. Inguinal regions normal.  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  Intramedullary rod and proximal screw noted in the visible imaged portion of the proximal left femur.  No acute fracture.  IMPRESSION:  1.  No explanation for patient's pain is identified. 2.  3.5 cm right hepatic lobe cyst with several scattered small low- density lesions in the liver (too small to characterize but likely cysts). 3.  Prostamegaly.  Consider correlation with PSA values. 4.  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  disease of the lumbar spine.   Original Report Authenticated By: Britta Mccreedy, M.D.   Dg Chest Portable 1 View  12/31/2012   *RADIOLOGY REPORT*  Clinical Data: Back pain.  PORTABLE CHEST - 1 VIEW  Comparison: 12/19/2009.  Findings: Chronic cardiopericardial enlargement and widening of the upper mediastinum.  Markings are prominent at the right base, atelectasis by preceding CT.  No edema, focal infiltrate, effusion, pneumothorax.  Lower cervical and upper thoracic ACDF with ventral plate screw fixation.  IMPRESSION: 1. No evidence of acute cardiopulmonary disease.  2. Mild atelectasis at the right base.   Original Report Authenticated By: Tiburcio Pea   US Abdomen Limited Ruq  12/31/2012   *RADIOLOGY REPORT*  Clinical Data:  Right upper quadrant pain   LIMITED ABDOMINAL ULTRASOUND - RIGHT UPPER QUADRANT  Comparison:  None.  Findings:  Gallbladder:  Negative for gallstones.  Gallbladder wall is mildly thickened at 6.1 mm.  Negative sonographic Murphy's sign.  Common bile duct:  4 mm  Liver:  Echogenic liver compatible fatty infiltration.  Hypoechoic ill-defined area left lobe compatible with  fatty sparing.  4 cm cyst in the right lobe of the liver.  IMPRESSION: Negative for gallstones.  Gallbladder wall appears mildly thickened however the patient is not tender over the gallbladder.  Fatty infiltration of the liver.                    Original Report Authenticated By: Janeece Riggers, M.D.    ROS: See chart Blood pressure 127/78, pulse 85, temperature 98.2 F (36.8 C), temperature source Oral, resp. rate 18, height 5\' 11"  (1.803 m), weight 117.935 kg (260 lb), SpO2 93.00%. Physical Exam: Well-developed well-nourished white male in no acute distress. Abdomen: Soft, nontender, nondistended. No hepatosplenomegaly, masses, or hernias identified.  Assessment/Plan: Impression: Abdominal pain of unknown etiology, question peptic ulcer disease versus acalculous cholecystitis or recent passage of a gallstone. He does not have pancreatitis. He also has mild renal insufficiency.  No need for acute surgical intervention at this time. Plan: Would admit patient to the hospital for observation and IV hydration. He does not need antibiotics at this time. Would start proton next. He may have a regular diet. Will check liver enzyme tests in a.m.  Hawa Henly A 12/31/2012, 9:58 AM

## 2013-01-01 DIAGNOSIS — R1013 Epigastric pain: Secondary | ICD-10-CM

## 2013-01-01 DIAGNOSIS — R1011 Right upper quadrant pain: Secondary | ICD-10-CM

## 2013-01-01 LAB — HEPATITIS B SURFACE ANTIGEN: Hepatitis B Surface Ag: NEGATIVE

## 2013-01-01 LAB — BASIC METABOLIC PANEL
CO2: 27 mEq/L (ref 19–32)
Calcium: 8.6 mg/dL (ref 8.4–10.5)
Glucose, Bld: 110 mg/dL — ABNORMAL HIGH (ref 70–99)
Potassium: 4.1 mEq/L (ref 3.5–5.1)
Sodium: 138 mEq/L (ref 135–145)

## 2013-01-01 LAB — CBC
Hemoglobin: 14.5 g/dL (ref 13.0–17.0)
MCH: 34 pg (ref 26.0–34.0)
Platelets: 104 10*3/uL — ABNORMAL LOW (ref 150–400)
RBC: 4.27 MIL/uL (ref 4.22–5.81)
WBC: 6 10*3/uL (ref 4.0–10.5)

## 2013-01-01 LAB — HEPATIC FUNCTION PANEL
Bilirubin, Direct: 3.9 mg/dL — ABNORMAL HIGH (ref 0.0–0.3)
Indirect Bilirubin: 1 mg/dL — ABNORMAL HIGH (ref 0.3–0.9)
Total Bilirubin: 4.9 mg/dL — ABNORMAL HIGH (ref 0.3–1.2)

## 2013-01-01 LAB — HEPATITIS C ANTIBODY: HCV Ab: NEGATIVE

## 2013-01-01 LAB — LIPASE, BLOOD: Lipase: 48 U/L (ref 11–59)

## 2013-01-01 MED ORDER — LORATADINE 10 MG PO TABS
10.0000 mg | ORAL_TABLET | Freq: Every day | ORAL | Status: DC
  Administered 2013-01-01 – 2013-01-03 (×3): 10 mg via ORAL
  Filled 2013-01-01 (×3): qty 1

## 2013-01-01 MED ORDER — SALINE SPRAY 0.65 % NA SOLN
NASAL | Status: AC
  Filled 2013-01-01: qty 44

## 2013-01-01 MED ORDER — SALINE SPRAY 0.65 % NA SOLN
1.0000 | NASAL | Status: DC | PRN
  Administered 2013-01-01: 1 via NASAL
  Filled 2013-01-01: qty 44

## 2013-01-01 NOTE — Progress Notes (Signed)
Subjective: No complaints of abdominal pain.  Objective: Vital signs in last 24 hours: Temp:  [97.5 F (36.4 C)-99.7 F (37.6 C)] 98.4 F (36.9 C) (09/06 0634) Pulse Rate:  [71-99] 71 (09/06 0634) Resp:  [19-20] 20 (09/06 0634) BP: (101-153)/(55-89) 138/76 mmHg (09/06 0634) SpO2:  [94 %-98 %] 94 % (09/06 0634) Weight:  [119.3 kg (263 lb 0.1 oz)] 119.3 kg (263 lb 0.1 oz) (09/05 1101) Last BM Date: 12/31/12  Intake/Output from previous day: 09/05 0701 - 09/06 0700 In: 1431 [P.O.:1170; I.V.:261] Out: 9 [Urine:8; Stool:1] Intake/Output this shift: Total I/O In: 360 [P.O.:360] Out: -   General appearance: alert, cooperative and no distress GI: Soft. Minimal tenderness noted right upper quadrant to deep palpation. No rigidity noted.  Lab Results:   Recent Labs  12/31/12 0438 01/01/13 0609  WBC 7.8 6.0  HGB 15.9 14.5  HCT 45.9 43.3  PLT 129* 104*   BMET  Recent Labs  12/31/12 0438 01/01/13 0609  NA 136 138  K 4.3 4.1  CL 101 102  CO2 24 27  GLUCOSE 163* 110*  BUN 29* 19  CREATININE 1.17 1.07  CALCIUM 9.4 8.6   PT/INR No results found for this basename: LABPROT, INR,  in the last 72 hours  Studies/Results: Ct Abdomen Pelvis W Contrast  12/30/2012   *RADIOLOGY REPORT*  Clinical Data: Severe epigastric pain  CT ABDOMEN AND PELVIS WITH CONTRAST  Technique:  Multidetector CT imaging of the abdomen and pelvis was performed following the standard protocol during bolus administration of intravenous contrast.  Contrast: 80mL OMNIPAQUE IOHEXOL 300 MG/ML  SOLN  Comparison: Abdominal ultrasound 06/23/2012  Findings: Lung bases:  There is streaky atelectasis in the right lower lobe and lingula.  Abdomen/pelvis:  3.5 cm simple cyst right lobe of the liver.  7 mm circumscribed low density lesion in the gallbladder fossa that is too small to characterize but likely a cyst.  8 mm low density lesion in the left hepatic lobe is too small to characterize but likely a cyst.  The  gallbladder, spleen, common bile duct, adrenal glands, and pancreas are within normal limits.  10 mm cyst upper pole left kidney.  Right kidney normal.  Negative for hydronephrosis bilaterally.  The ureters are normal in caliber bilaterally.  Stomach well distended with oral contrast and normal.  Small bowel loops are normal in caliber and wall thickness, with the distal small bowel not opacified with contrast at the time imaging. Terminal ileum unremarkable.  Normal appendix seen on coronal image number 64. Normal caliber colon.  Mild sigmoid colon diverticulosis, uncomplicated. No mesenteric inflammatory changes.  Normal urinary bladder.  Mild prostamegaly.  Negative for lymphadenopathy or ascites.  Atherosclerotic calcification and peripheral mural plaque involving the normal caliber abdominal aorta.  Atherosclerotic calcification involves the common and internal iliac arteries bilaterally. Inguinal regions normal.  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  Intramedullary rod and proximal screw noted in the visible imaged portion of the proximal left femur.  No acute fracture.  IMPRESSION:  1.  No explanation for patient's pain is identified. 2.  3.5 cm right hepatic lobe cyst with several scattered small low- density lesions in the liver (too small to characterize but likely cysts). 3.  Prostamegaly.  Consider correlation with PSA values. 4.  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  disease of the lumbar spine.   Original Report  Authenticated By: Britta Mccreedy, M.D.   Dg Chest Portable 1 View  12/31/2012   *RADIOLOGY REPORT*  Clinical Data: Back pain.  PORTABLE CHEST - 1 VIEW  Comparison: 12/19/2009.  Findings: Chronic cardiopericardial enlargement and widening of the upper mediastinum.  Markings are prominent at the right base, atelectasis by preceding CT.  No edema, focal  infiltrate, effusion, pneumothorax.  Lower cervical and upper thoracic ACDF with ventral plate screw fixation.  IMPRESSION: 1. No evidence of acute cardiopulmonary disease.  2. Mild atelectasis at the right base.   Original Report Authenticated By: Tiburcio Pea   US Abdomen Limited Ruq  12/31/2012   *RADIOLOGY REPORT*  Clinical Data:  Right upper quadrant pain  LIMITED ABDOMINAL ULTRASOUND - RIGHT UPPER QUADRANT  Comparison:  None.  Findings:  Gallbladder:  Negative for gallstones.  Gallbladder wall is mildly thickened at 6.1 mm.  Negative sonographic Murphy's sign.  Common bile duct:  4 mm  Liver:  Echogenic liver compatible fatty infiltration.  Hypoechoic ill-defined area left lobe compatible with  fatty sparing.  4 cm cyst in the right lobe of the liver.  IMPRESSION: Negative for gallstones.  Gallbladder wall appears mildly thickened however the patient is not tender over the gallbladder.  Fatty infiltration of the liver.                    Original Report Authenticated By: Janeece Riggers, M.D.    Anti-infectives: Anti-infectives   None      Assessment/Plan: Impression: Hyperbilirubinemia and liver enzyme tests worsening, consistent with possible choledocholithiasis. Lipase has been ordered as an add-on to check for gallstone pancreatitis. I've asked Dr. Karilyn Cota to see the patient as his direct bilirubin is rising. He will probably need his gallbladder out at some point during this admission.  LOS: 1 day    Dunia Pringle A 01/01/2013

## 2013-01-01 NOTE — Consult Note (Signed)
Referring Provider: No ref. provider found Primary Care Physician:  Alice Reichert, MD Primary Gastroenterologist:  Dr. Karilyn Cota  Reason for Consultation:   Biliary colic.  Elevated transaminases and bilirubin.  HPI:  Patient is 67 year old Caucasian male whose present illness began 4 days ago when he experienced postprandial epigastric and right upper quadrant pain which resolved within 3 hours. Pain relapsed 2 days ago following a meal. This time pain was intense and associated with nausea and vomiting. Patient's LFTs were within normal limits. Similarly abdominal pelvic CT with contrast provided no clues as to the source of pain. CBD was of normal caliber. He had at throat sec carotid changes to the abdominal aorta and degenerative disc disease lumbar spine with evidence of prior surgery. There was incidental finding of cyst in the right hepatic lobe.patient was therefore discharged but he returned early yesterday morning with another episode of severe pain associated with nausea and vomiting.this time he was noted to have elevated transaminases.ultrasound of upper abdomen was obtained and he was hospitalized gallbladder wall appeared to be mildly thickened but there was no evidence of choledocholithiasis. Bile duct measured 4 mm.patient was seen in consultation by Dr. Lovell Sheehan. This morning he was noted to have significant bump in his bilirubin from 1.1 yesterday to 4.9 today there was also increase in the degree of AST and ALT elevation. Dr. Lovell Sheehan felt that he needed to have cholecystectomy and but prior to that he needed an ERCP as he is suspected to have choledocholithiasis.  This morning he complains of mild discomfort under right rib cage. Pain was all across his abdomen and back when was intense. He had no difficulty with breakfast. He has had good appetite. He has gained 10 pounds this year. He states he is unable to do much walking or exercise on account of chronic right knee pain. He denies  fever but he did have an episode or two of chills. He denies melena or rectal bleeding. Patient recalls that several years ago he was told by the [employer] that his blood work was abnormal and even for blood urination. He states he had blood test done at a later date but these were inconclusive. There is no history of icteric hepatitis or jaundice.   Past Medical History  Diagnosis Date  . Thyroid disease   . Hypertension   . Gout   . Femur fracture   . Atrial fibrillation     Past Surgical History  Procedure Laterality Date  . Cervical spine surgery    . Fracture surgery      left arm  . Femur fracture surgery      Prior to Admission medications   Medication Sig Start Date End Date Taking? Authorizing Provider  allopurinol (ZYLOPRIM) 300 MG tablet Take 300 mg by mouth daily.   Yes Historical Provider, MD  alum & mag hydroxide-simeth (MYLANTA) 200-200-20 MG/5ML suspension Take 15 mLs by mouth once as needed for indigestion.   Yes Historical Provider, MD  amiodarone (PACERONE) 100 MG tablet Take 100 mg by mouth daily.   Yes Historical Provider, MD  aspirin EC 81 MG tablet Take 81 mg by mouth every morning.   Yes Historical Provider, MD  diclofenac (VOLTAREN) 75 MG EC tablet Take 75 mg by mouth daily.    Yes Historical Provider, MD  levothyroxine (SYNTHROID, LEVOTHROID) 137 MCG tablet Take 137 mcg by mouth daily before breakfast.   Yes Historical Provider, MD  lisinopril (PRINIVIL,ZESTRIL) 20 MG tablet Take 20 mg by mouth  daily.   Yes Historical Provider, MD  naproxen sodium (ALEVE) 220 MG tablet Take 220 mg by mouth 2 (two) times daily.   Yes Historical Provider, MD  oxyCODONE-acetaminophen (PERCOCET) 5-325 MG per tablet Take 2 tablets by mouth every 4 (four) hours as needed for pain. 12/30/12   Geoffery Lyons, MD    Current Facility-Administered Medications  Medication Dose Route Frequency Provider Last Rate Last Dose  . acetaminophen (TYLENOL) tablet 650 mg  650 mg Oral Q6H PRN Standley Brooking, MD       Or  . acetaminophen (TYLENOL) suppository 650 mg  650 mg Rectal Q6H PRN Standley Brooking, MD      . allopurinol (ZYLOPRIM) tablet 300 mg  300 mg Oral Daily Standley Brooking, MD   300 mg at 01/01/13 1006  . amiodarone (PACERONE) tablet 100 mg  100 mg Oral Daily Standley Brooking, MD   100 mg at 01/01/13 1005  . HYDROcodone-acetaminophen (NORCO/VICODIN) 5-325 MG per tablet 1-2 tablet  1-2 tablet Oral Q4H PRN Standley Brooking, MD   2 tablet at 12/31/12 1943  . HYDROmorphone (DILAUDID) injection 1 mg  1 mg Intravenous Q4H PRN Standley Brooking, MD      . levothyroxine (SYNTHROID, LEVOTHROID) tablet 137 mcg  137 mcg Oral QAC breakfast Standley Brooking, MD   137 mcg at 01/01/13 0700  . lisinopril (PRINIVIL,ZESTRIL) tablet 20 mg  20 mg Oral Daily Standley Brooking, MD   20 mg at 01/01/13 1004  . ondansetron (ZOFRAN) tablet 4 mg  4 mg Oral Q6H PRN Standley Brooking, MD       Or  . ondansetron [facility]) injection 4 mg  4 mg Intravenous Q6H PRN Standley Brooking, MD        Allergies as of 12/31/2012  . (No Known Allergies)    Family History  Problem Relation Age of Onset  . Kidney Stones Mother     History   Social History  . Marital Status: Married    Spouse Name: [name]    Number of Children: [name]  . Years of Education: [name]   Occupational History  . Not on file.   Social History Main Topics  . Smoking status: Never Smoker   . Smokeless tobacco: Not on file  . Alcohol Use: Yes  . Drug Use: No  . Sexual Activity: Not on file   Other Topics Concern  . Not on file   Social History Narrative  . No narrative on file    Review of Systems: See HPI, otherwise normal ROS  Physical Exam: Temp:  [97.5 F (36.4 C)-99.7 F (37.6 C)] 98.4 F (36.9 C) (09/06 0634) Pulse Rate:  [71-99] 71 (09/06 0634) Resp:  [19-20] 20 (09/06 0634) BP: (101-153)/(55-89) 138/76 mmHg (09/06 0634) SpO2:  [94 %-98 %] 94 % (09/06 0634) Weight:  [263 lb 0.1 oz (119.3 kg)] 263 lb 0.1  oz (119.3 kg) (09/05 1101) Pleasant well-developed obese Caucasian male in NAD. Conjunctivae was pink. Sclera does not appear to be icteric in artificial light. Oropharyngeal mucosa is normal. No neck masses or thyromegaly noted. Cardiac exam with a regular rhythm normal S1 and S2. No murmur or gallop noted. Lungs are clear to auscultation. Abdomen is full. Bowel sounds are normal. Palpation reveals soft abdomen with mild tenderness below the right costal margin. No masses or organomegaly. No peripheral edema or clubbing noted.   Lab Results:  Recent Labs  12/30/12 2026 12/31/12 0438 01/01/13 0981  WBC 6.5 7.8 6.0  HGB 16.2 15.9 14.5  HCT 48.3 45.9 43.3  PLT 147* 129* 104*   BMET  Recent Labs  12/30/12 2026 12/31/12 0438 01/01/13 0609  NA 140 136 138  K 3.9 4.3 4.1  CL 102 101 102  CO2 26 24 27   GLUCOSE 124* 163* 110*  BUN 32* 29* 19  CREATININE 1.81* 1.17 1.07  CALCIUM 10.0 9.4 8.6   LFT  Recent Labs  01/01/13 0609  PROT 6.3  ALBUMIN 3.6  AST 127*  ALT 209*  ALKPHOS 118*  BILITOT 4.9*  BILIDIR 3.9*  IBILI 1.0*   PT/INR No results found for this basename: LABPROT, INR,  in the last 72 hours Hepatitis Panel No results found for this basename: HEPBSAG, HCVAB, HEPAIGM, HEPBIGM,  in the last 72 hours  Studies/Results: Ct Abdomen Pelvis W Contrast  12/30/2012   *RADIOLOGY REPORT*  Clinical Data: Severe epigastric pain  CT ABDOMEN AND PELVIS WITH CONTRAST  Technique:  Multidetector CT imaging of the abdomen and pelvis was performed following the standard protocol during bolus administration of intravenous contrast.  Contrast: 80mL OMNIPAQUE IOHEXOL 300 MG/ML  SOLN  Comparison: Abdominal ultrasound 06/23/2012  Findings: Lung bases:  There is streaky atelectasis in the right lower lobe and lingula.  Abdomen/pelvis:  3.5 cm simple cyst right lobe of the liver.  7 mm circumscribed low density lesion in the gallbladder fossa that is too small to characterize but likely  a cyst.  8 mm low density lesion in the left hepatic lobe is too small to characterize but likely a cyst.  The gallbladder, spleen, common bile duct, adrenal glands, and pancreas are within normal limits.  10 mm cyst upper pole left kidney.  Right kidney normal.  Negative for hydronephrosis bilaterally.  The ureters are normal in caliber bilaterally.  Stomach well distended with oral contrast and normal.  Small bowel loops are normal in caliber and wall thickness, with the distal small bowel not opacified with contrast at the time imaging. Terminal ileum unremarkable.  Normal appendix seen on coronal image number 64. Normal caliber colon.  Mild sigmoid colon diverticulosis, uncomplicated. No mesenteric inflammatory changes.  Normal urinary bladder.  Mild prostamegaly.  Negative for lymphadenopathy or ascites.  Atherosclerotic calcification and peripheral mural plaque involving the normal caliber abdominal aorta.  Atherosclerotic calcification involves the common and internal iliac arteries bilaterally. Inguinal regions normal.  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  Intramedullary rod and proximal screw noted in the visible imaged portion of the proximal left femur.  No acute fracture.  IMPRESSION:  1.  No explanation for patient's pain is identified. 2.  3.5 cm right hepatic lobe cyst with several scattered small low- density lesions in the liver (too small to characterize but likely cysts). 3.  Prostamegaly.  Consider correlation with PSA values. 4.  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  disease of the lumbar spine.   Original Report Authenticated By: Britta Mccreedy, M.D.   Dg Chest Portable 1 View  12/31/2012   *RADIOLOGY REPORT*  Clinical Data: Back pain.  PORTABLE CHEST - 1 VIEW  Comparison: 12/19/2009.  Findings: Chronic cardiopericardial enlargement and widening of the upper  mediastinum.  Markings are prominent at the right base, atelectasis by preceding CT.  No edema, focal infiltrate, effusion, pneumothorax.  Lower cervical and upper thoracic ACDF with ventral plate screw fixation.  IMPRESSION: 1. No evidence of acute cardiopulmonary disease.  2. Mild atelectasis at the right base.   Original Report Authenticated By: Tiburcio Pea   US Abdomen Limited Ruq  12/31/2012   *RADIOLOGY REPORT*  Clinical Data:  Right upper quadrant pain  LIMITED ABDOMINAL ULTRASOUND - RIGHT UPPER QUADRANT  Comparison:  None.  Findings:  Gallbladder:  Negative for gallstones.  Gallbladder wall is mildly thickened at 6.1 mm.  Negative sonographic Murphy's sign.  Common bile duct:  4 mm  Liver:  Echogenic liver compatible fatty infiltration.  Hypoechoic ill-defined area left lobe compatible with  fatty sparing.  4 cm cyst in the right lobe of the liver.  IMPRESSION: Negative for gallstones.  Gallbladder wall appears mildly thickened however the patient is not tender over the gallbladder.  Fatty infiltration of the liver.                    Original Report Authenticated By: Janeece Riggers, M.D.    Assessment; Patient is 67 year old Caucasian male who presents with 3 episodes of upper abdominal pain associated with nausea and vomiting. He has elevated transaminases and significant bump in bilirubin which is predominate directly.he had abdominal CT 2 days ago which was negative for cholelithiasis or dilated bile duct.ultrasound yesterday revealed gallbladder wall thickening but no evidence of cholelithiasis or choledocholithiasis.patient's symptom complex is typical of biliary colic. Suspect he may have small stones in his bile duct as well as gallbladder not picked up on ultrasound. He would therefore need ERCP prior to cholecystectomy. Remote history of elevated transaminases. Will check for hepatitis B and C. Transaminases are normal then he came to the emergency room 2 days ago. Therefore this process  does not appear to be chronic. Current elevation of transaminases secondary to biliary tract disease.  Recommendations; ERCP, possible sphincterotomy and stone extraction  and a.m. Ceftriaxone 1 g IV every 24 hours. Hold aspirin and Lovenox for now. Hepatitis B surface antigen and HCV antibody.   LOS: 1 day   Jaunice Mirza U  01/01/2013, 10:36 AM

## 2013-01-01 NOTE — Progress Notes (Signed)
[id number] 

## 2013-01-01 NOTE — Progress Notes (Signed)
Wayne Ortiz, WILFORD NO.:  [id number]  MEDICAL RECORD NO.:  [id number]  LOCATION:  A309                          FACILITY:  [facility]  PHYSICIAN:  Melvyn Novas, MDDATE OF BIRTH:  Sep 05, 1945  DATE OF PROCEDURE: DATE OF DISCHARGE:                                PROGRESS NOTE   [name] with hyperbilirubinemia brought in to the hospital with bilateral mid back discomfort, no radiation to the legs. He was told years ago on donating blood that he has evidence of liver inflammation at home.  The patient has significant spinal stenosis, chose not to operate with neurosurgeon at present, not certain if some of his pain may be muscular or spinal related.  Workup thus far reveals no evidence of gallstones, mildly thickened gallbladder, and concern about of choledocholithiasis or ductal stricture as a possibility I believe.  ERCP is planned for a.m. with Dr. Karilyn Cota.  Lipase is within normal limits.  No architectural distortions on CAT scan of pancreas, gallbladder, biliary tree thus far.  Significant abnormal labs include AST of 127 and ALT of 209 and direct bilirubin of 3.9, with indirect of 1.  Consideration given to viral hepatitis of chronic nature, we will institute workup for hepatitis B and C if not already done.  PHYSICAL EXAMINATION:  LUNGS:  Clear.  No rales, wheeze, or rhonchi. HEART:  Regular rhythm.  No murmurs, gallops, heaves, thrills, or rubs. ABDOMEN:  No right upper quadrant or left upper quadrant tenderness.  No guarding, rebound, mass, or megaly.  No paraspinal tenderness.  We will get hepatitis B and C serologies, liver function tests for a.m., ERCP as planned, clear liquids, and we will consider spinal visualization, although the patient had cervical fusion many years ago.     Melvyn Novas, MD     RMD/MEDQ  D:  01/01/2013  T:  01/01/2013  Job:  [id number]

## 2013-01-02 ENCOUNTER — Encounter (HOSPITAL_COMMUNITY)
Admission: EM | Disposition: A | Discharge status: Home / Self Care | Payer: Self-pay | Source: Home / Self Care | Attending: Emergency Medicine

## 2013-01-02 ENCOUNTER — Observation Stay (HOSPITAL_COMMUNITY): Payer: Medicare Other

## 2013-01-02 ENCOUNTER — Encounter (HOSPITAL_COMMUNITY): Payer: Self-pay | Admitting: Anesthesiology

## 2013-01-02 ENCOUNTER — Observation Stay (HOSPITAL_COMMUNITY): Payer: Medicare Other | Admitting: Anesthesiology

## 2013-01-02 DIAGNOSIS — K8020 Calculus of gallbladder without cholecystitis without obstruction: Secondary | ICD-10-CM

## 2013-01-02 DIAGNOSIS — K208 Other esophagitis without bleeding: Secondary | ICD-10-CM

## 2013-01-02 DIAGNOSIS — R7989 Other specified abnormal findings of blood chemistry: Secondary | ICD-10-CM

## 2013-01-02 HISTORY — PX: SPHINCTEROTOMY: SHX5544

## 2013-01-02 HISTORY — PX: ERCP: SHX5425

## 2013-01-02 LAB — CBC
MCH: 33.6 pg (ref 26.0–34.0)
MCHC: 33.4 g/dL (ref 30.0–36.0)
Platelets: 96 10*3/uL — ABNORMAL LOW (ref 150–400)
RBC: 4.35 MIL/uL (ref 4.22–5.81)

## 2013-01-02 LAB — BASIC METABOLIC PANEL
CO2: 24 mEq/L (ref 19–32)
Calcium: 8.8 mg/dL (ref 8.4–10.5)
GFR calc non Af Amer: 73 mL/min — ABNORMAL LOW (ref >90)
Potassium: 3.9 mEq/L (ref 3.5–5.1)
Sodium: 136 mEq/L (ref 135–145)

## 2013-01-02 LAB — HEPATIC FUNCTION PANEL
AST: 67 U/L — ABNORMAL HIGH (ref 0–37)
Albumin: 3.4 g/dL — ABNORMAL LOW (ref 3.5–5.2)
Total Protein: 6.5 g/dL (ref 6.0–8.3)

## 2013-01-02 SURGERY — ERCP, WITH INTERVENTION IF INDICATED
Anesthesia: General

## 2013-01-02 MED ORDER — CLINDAMYCIN PHOSPHATE 900 MG/50ML IV SOLN
900.0000 mg | Freq: Once | INTRAVENOUS | Status: AC
  Administered 2013-01-03: 900 mg via INTRAVENOUS

## 2013-01-02 MED ORDER — MIDAZOLAM HCL 2 MG/2ML IJ SOLN
INTRAMUSCULAR | Status: AC
  Filled 2013-01-02: qty 2

## 2013-01-02 MED ORDER — PROPOFOL 10 MG/ML IV EMUL
INTRAVENOUS | Status: AC
  Filled 2013-01-02: qty 20

## 2013-01-02 MED ORDER — GLYCOPYRROLATE 0.2 MG/ML IJ SOLN
INTRAMUSCULAR | Status: DC | PRN
  Administered 2013-01-02: 0.2 mg via INTRAVENOUS
  Administered 2013-01-02: 0.4 mg via INTRAVENOUS

## 2013-01-02 MED ORDER — FENTANYL CITRATE 0.05 MG/ML IJ SOLN
INTRAMUSCULAR | Status: AC
  Filled 2013-01-02: qty 2

## 2013-01-02 MED ORDER — GLYCOPYRROLATE 0.2 MG/ML IJ SOLN
INTRAMUSCULAR | Status: AC
  Filled 2013-01-02: qty 1

## 2013-01-02 MED ORDER — LACTATED RINGERS IV SOLN
INTRAVENOUS | Status: DC | PRN
  Administered 2013-01-02: 10:00:00 via INTRAVENOUS

## 2013-01-02 MED ORDER — STERILE WATER FOR IRRIGATION IR SOLN
Status: DC | PRN
  Administered 2013-01-02: 09:00:00

## 2013-01-02 MED ORDER — GLUCAGON HCL (RDNA) 1 MG IJ SOLR
INTRAMUSCULAR | Status: DC | PRN
  Administered 2013-01-02 (×5): 0.25 mg via INTRAVENOUS

## 2013-01-02 MED ORDER — NEOSTIGMINE METHYLSULFATE 1 MG/ML IJ SOLN
INTRAMUSCULAR | Status: DC | PRN
  Administered 2013-01-02: 2 mg via INTRAVENOUS

## 2013-01-02 MED ORDER — PROPOFOL 10 MG/ML IV BOLUS
INTRAVENOUS | Status: DC | PRN
  Administered 2013-01-02: 20 mg via INTRAVENOUS
  Administered 2013-01-02: 150 mg via INTRAVENOUS

## 2013-01-02 MED ORDER — ROCURONIUM BROMIDE 100 MG/10ML IV SOLN
INTRAVENOUS | Status: DC | PRN
  Administered 2013-01-02: 25 mg via INTRAVENOUS
  Administered 2013-01-02: 5 mg via INTRAVENOUS

## 2013-01-02 MED ORDER — LIDOCAINE HCL (CARDIAC) 10 MG/ML IV SOLN
INTRAVENOUS | Status: DC | PRN
  Administered 2013-01-02: 20 mg via INTRAVENOUS

## 2013-01-02 MED ORDER — CHLORHEXIDINE GLUCONATE 4 % EX LIQD: 1.0000 "application " | Freq: Once | CUTANEOUS | Status: DC

## 2013-01-02 MED ORDER — LIDOCAINE HCL (PF) 1 % IJ SOLN
INTRAMUSCULAR | Status: AC
  Filled 2013-01-02: qty 5

## 2013-01-02 MED ORDER — SUCCINYLCHOLINE CHLORIDE 20 MG/ML IJ SOLN
INTRAMUSCULAR | Status: AC
  Filled 2013-01-02: qty 1

## 2013-01-02 MED ORDER — ONDANSETRON HCL 4 MG/2ML IJ SOLN
INTRAMUSCULAR | Status: DC | PRN
  Administered 2013-01-02: 4 mg via INTRAVENOUS

## 2013-01-02 MED ORDER — SUCCINYLCHOLINE CHLORIDE 20 MG/ML IJ SOLN
INTRAMUSCULAR | Status: DC | PRN
  Administered 2013-01-02: 140 mg via INTRAVENOUS

## 2013-01-02 MED ORDER — GLYCOPYRROLATE 0.2 MG/ML IJ SOLN
INTRAMUSCULAR | Status: AC
  Filled 2013-01-02: qty 2

## 2013-01-02 MED ORDER — ROCURONIUM BROMIDE 50 MG/5ML IV SOLN
INTRAVENOUS | Status: AC
  Filled 2013-01-02: qty 1

## 2013-01-02 MED ORDER — FENTANYL CITRATE 0.05 MG/ML IJ SOLN
INTRAMUSCULAR | Status: DC | PRN
  Administered 2013-01-02: 25 ug via INTRAVENOUS
  Administered 2013-01-02 (×2): 50 ug via INTRAVENOUS

## 2013-01-02 MED ORDER — CLINDAMYCIN PHOSPHATE 900 MG/50ML IV SOLN: 900.0000 mg | Freq: Once | INTRAVENOUS | Status: DC

## 2013-01-02 MED ORDER — SODIUM CHLORIDE 0.9 % IV SOLN: INTRAVENOUS | Status: DC

## 2013-01-02 MED ORDER — SODIUM CHLORIDE 0.9 % IV SOLN
INTRAVENOUS | Status: DC | PRN
  Administered 2013-01-02: 09:00:00

## 2013-01-02 MED ORDER — CHLORHEXIDINE GLUCONATE 4 % EX LIQD
Freq: Once | CUTANEOUS | Status: DC
  Filled 2013-01-02: qty 15

## 2013-01-02 MED ORDER — ONDANSETRON HCL 4 MG/2ML IJ SOLN
INTRAMUSCULAR | Status: AC
  Filled 2013-01-02: qty 2

## 2013-01-02 MED ORDER — MIDAZOLAM HCL 5 MG/5ML IJ SOLN
INTRAMUSCULAR | Status: DC | PRN
  Administered 2013-01-02 (×2): 1 mg via INTRAVENOUS

## 2013-01-02 SURGICAL SUPPLY — 27 items
BAG HAMPER (MISCELLANEOUS) ×2 IMPLANT
BALLN RETRIEVAL 12X15 (BALLOONS) ×1 IMPLANT
BALN RTRVL 200 6-7FR 12-15 (BALLOONS) ×1
BASKET TRAPEZOID 3X6 (MISCELLANEOUS) IMPLANT
BSKT STON RTRVL TRAPEZOID 3X6 (MISCELLANEOUS)
DEVICE INFLATION ENCORE 26 (MISCELLANEOUS) IMPLANT
DEVICE LOCKING W-BIOPSY CAP (MISCELLANEOUS) ×2 IMPLANT
GUIDEWIRE HYDRA JAGWIRE .35 (WIRE) IMPLANT
GUIDEWIRE JAG HINI 025X260CM (WIRE) IMPLANT
KIT ROOM TURNOVER APOR (KITS) ×2 IMPLANT
LUBRICANT JELLY 4.5OZ STERILE (MISCELLANEOUS) ×1 IMPLANT
NDL HYPO 18GX1.5 BLUNT FILL (NEEDLE) IMPLANT
NEEDLE HYPO 18GX1.5 BLUNT FILL (NEEDLE) ×2 IMPLANT
PAD ARMBOARD 7.5X6 YLW CONV (MISCELLANEOUS) ×2 IMPLANT
PATHFINDER 450CM 0.18 (STENTS) IMPLANT
POSITIONER HEAD 8X9X4 ADT (SOFTGOODS) IMPLANT
SNARE ROTATE MED OVAL 20MM (MISCELLANEOUS) IMPLANT
SPHINCTEROTOME AUTOTOME .25 (MISCELLANEOUS) ×3 IMPLANT
SPHINCTEROTOME HYDRATOME 44 (MISCELLANEOUS) ×3 IMPLANT
SPONGE GAUZE 4X4 12PLY (GAUZE/BANDAGES/DRESSINGS) ×2 IMPLANT
SYR 3ML LL SCALE MARK (SYRINGE) IMPLANT
SYR 50ML LL SCALE MARK (SYRINGE) ×2 IMPLANT
SYSTEM CONTINUOUS INJECTION (MISCELLANEOUS) ×2 IMPLANT
TUBING ENDO SMARTCAP PENTAX (MISCELLANEOUS) ×2 IMPLANT
WALLSTENT METAL COVERED 10X60 (STENTS) IMPLANT
WALLSTENT METAL COVERED 10X80 (STENTS) IMPLANT
WATER STERILE IRR 1000ML POUR (IV SOLUTION) ×3 IMPLANT

## 2013-01-02 NOTE — Progress Notes (Signed)
Appreciate Dr. Patty Sermons assistance. Patient did have choledocholithiasis. This was successfully removed. Will plan on laparoscopic cholecystectomy tomorrow. Risks and benefits of the procedure including bleeding, infection, hepatobiliary injury, the possibility of an open procedure were fully explained to the patient, who gave informed consent.

## 2013-01-02 NOTE — Anesthesia Preprocedure Evaluation (Signed)
Anesthesia Evaluation  Patient identified by MRN, date of birth, ID band Patient awake    Reviewed: Allergy & Precautions, H&P , NPO status , Patient's Chart, lab work & pertinent test results, reviewed documented beta blocker date and time   Airway Mallampati: III TM Distance: >3 FB Neck ROM: Full    Dental  (+) Partial Lower, Partial Upper and Dental Advisory Given   Pulmonary  breath sounds clear to auscultation        Cardiovascular hypertension, Pt. on medications + dysrhythmias Rhythm:Regular  History of Afib Cardioverted 2012 On meds  No current problems   Neuro/Psych Status post cervical spine surgery good range of motion    GI/Hepatic   Endo/Other  Hypothyroidism   Renal/GU      Musculoskeletal  (+) Arthritis -, Osteoarthritis,    Abdominal   Peds  Hematology  (+) Blood dyscrasia, , Chronic thrombocytopenia   Anesthesia Other Findings Facial hair  Reproductive/Obstetrics                           Anesthesia Physical Anesthesia Plan  ASA: II and emergent  Anesthesia Plan: General   Post-op Pain Management:    Induction: Intravenous, Rapid sequence and Cricoid pressure planned  Airway Management Planned: Oral ETT  Additional Equipment:   Intra-op Plan:   Post-operative Plan: Extubation in OR  Informed Consent: I have reviewed the patients History and Physical, chart, labs and discussed the procedure including the risks, benefits and alternatives for the proposed anesthesia with the patient or authorized representative who has indicated his/her understanding and acceptance.   Dental advisory given  Plan Discussed with:   Anesthesia Plan Comments:         Anesthesia Quick Evaluation

## 2013-01-02 NOTE — Anesthesia Postprocedure Evaluation (Signed)
  Anesthesia Post-op Note  Patient: Wayne Ortiz  Procedure(s) Performed: Procedure(s) with comments: ENDOSCOPIC RETROGRADE CHOLANGIOPANCREATOGRAPHY (ERCP) ([name]) SPHINCTEROTOMY ([name]) - Stone Extraction  Patient Location: PACU  Anesthesia Type:General  Level of Consciousness: sedated and patient cooperative  Airway and Oxygen Therapy: Patient Spontanous Breathing and Patient connected to nasal cannula oxygen  Post-op Pain: mild  Post-op Assessment: Post-op Vital signs reviewed, Patient's Cardiovascular Status Stable, Respiratory Function Stable, Patent Airway and Pain level controlled  Post-op Vital Signs: Reviewed and stable  Complications: No apparent anesthesia complications

## 2013-01-02 NOTE — Progress Notes (Signed)
[id number] 

## 2013-01-02 NOTE — Addendum Note (Signed)
Addendum created 01/02/13 1136 by Franco Nones, CRNA   Modules edited: Anesthesia Flowsheet

## 2013-01-02 NOTE — Transfer of Care (Signed)
Immediate Anesthesia Transfer of Care Note  Patient: Wayne Ortiz  Procedure(s) Performed: Procedure(s) (LRB): ENDOSCOPIC RETROGRADE CHOLANGIOPANCREATOGRAPHY (ERCP) ([name]) SPHINCTEROTOMY ([name])  Patient Location: PACU  Anesthesia Type: General  Level of Consciousness: awake  Airway & Oxygen Therapy: Patient Spontanous Breathing and non-rebreather face mask  Post-op Assessment: Report given to PACU RN, Post -op Vital signs reviewed and stable and Patient moving all extremities  Post vital signs: Reviewed and stable  Complications: No apparent anesthesia complications

## 2013-01-02 NOTE — Anesthesia Procedure Notes (Signed)
Procedure Name: Intubation Date/Time: 01/02/2013 9:10 AM Performed by: Franco Nones Pre-anesthesia Checklist: Patient identified, Patient being monitored, Timeout performed, Emergency Drugs available and Suction available Patient Re-evaluated:Patient Re-evaluated prior to inductionOxygen Delivery Method: Circle System Utilized Preoxygenation: Pre-oxygenation with 100% oxygen Intubation Type: IV induction, Rapid sequence and Cricoid Pressure applied Laryngoscope Size: Miller and 2 Grade View: Grade I Tube type: Oral Tube size: 7.0 mm Number of attempts: 1 Airway Equipment and Method: stylet Placement Confirmation: ETT inserted through vocal cords under direct vision,  positive ETCO2 and breath sounds checked- equal and bilateral Secured at: 21 cm Tube secured with: Tape Dental Injury: Teeth and Oropharynx as per pre-operative assessment

## 2013-01-02 NOTE — Progress Notes (Signed)
Patient at night with nausea and discomfort across  upper abdomen. Bilirubin is down to 3.2. Transaminases remain elevated. WBCs 3.6 and platelet count 96K. Serum potassium 3.9. Hepatitis B surface antigen is negative. HCV antibody is negative. Given mild leukopenia and thrombocytopenia I wonder if he has underlying cirrhosis as well. INR on 12/21/2012 was 1.01 Acute symptoms appear to be secondary to biliary colic/possible choledocholithiasis. Will proceed with ERCP.

## 2013-01-02 NOTE — Op Note (Signed)
ERCP PROCEDURE REPORT  PATIENT:  Wayne Ortiz  MR#:  [id number] Birthdate:  1945-10-13, 67 y.o., male Endoscopist:  Dr. Malissa Hippo, MD Referred By:  Dr. Dalia Heading, MD Procedure Date: 01/02/2013  Procedure:   ERCP with biliary sphincterotomy and stone extraction.    Indications:  Patient is 67 year old Caucasian male who presented in recall which elevated bilirubin and transaminases. He is suspected to have choledocholithiasis.            Informed Consent:  The risks, benefits, limitations, alternatives, and mponderable have been reviewed with the patient. I specifically discussed a 1 in 10 chance of pancreatitis, reaction to medications, bleeding, perforation and the possibility of a failed ERCP. Potential for sphincterotomy and stent placement also reviewed. Questions have been answered. All parties agreeable.  Please see history & physical in medical record for more information.  Medications:  Endotracheal general anesthesia. Please see anesthesia record for complete details  Description of procedure:  Procedure performed in the OR. The patient was placed under anesthesia, intubated, and turned into semipermanent position. Therapeutic Pentax video duodenoscope passed through the oropharynx without any difficulty into the esophagus, stomach, and across the pylorus and pull, and descending duodenum.    Findings:  There was food debris in the stomach but pyloric channel was patent. Few antral erosions noted. Normal ampulla water with small periampullary diverticulum. Pancreatic duct was cannulated the guidewire but not filled with contrast. CBD and CHD were dilated measuring about 10 mm. Single filling defect noted in the distal CBD consistent with a stone. Biliary sphincterotomy performed and stone removed with stone balloon extractor.  Therapeutic/Diagnostic Maneuvers Performed:  See above  Complications:  None  Impression:  food debris and in the stomach but patent  pylorus. Erosive antral gastritis. Small periampullary diverticulum. Dilated CBD and CHD with single stone removed following biliary sphincterotomy.    Recommendations:  Clear liquids today. H. pylori serology. CBC, comprehensive chemistry panel and serum amylase in a.m. Laparoscopic cholecystectomy per Dr. Lovell Sheehan. Willo ask Dr. Lovell Sheehan did a liver biopsy at the time of surgery.   Xela Oregel U  01/02/2013  10:06 AM  CC: Dr. Alice Reichert, MD & Dr. Bonnetta Barry ref. provider found

## 2013-01-02 NOTE — Progress Notes (Signed)
NAMETORION, HULGAN NO.:  [id number]  MEDICAL RECORD NO.:  [id number]  LOCATION:  A309                          FACILITY:  [facility]  PHYSICIAN:  Melvyn Novas, MDDATE OF BIRTH:  06/12/1945  DATE OF PROCEDURE: DATE OF DISCHARGE:                                PROGRESS NOTE   The patient has just returned from ERCP with extraction of stone from common bile duct due to choledocholithiasis from 1 single stone.  His hepatitis surface antigen is negative.  Hepatitis C antibody is negative.  He has hyperbilirubinemia and thickening of gallbladder wall without stones noted.  He likewise has spinal stenosis and has bilateral back pain, which may or may not be attributable to this condition. Likewise, he has thrombocytopenia and a diagnosis of cirrhosis is entertained.  His lungs are clear.  No rales, wheeze or rhonchi.  Heart regular rhythm.  No murmurs, gallops, or rubs.  Abdomen soft, nontender. Bowel sounds normoactive.  The plan right now is to maintain him on clear liquids.  Obtain liver function tests in a.m. and see if his hyperbilirubinemia and LFTs resolve consideration given to cholecystectomy by Dr. Lovell Sheehan who will be asked to perform liver biopsy at that time.     Melvyn Novas, MD     RMD/MEDQ  D:  01/02/2013  T:  01/02/2013  Job:  [id number]

## 2013-01-03 ENCOUNTER — Encounter (HOSPITAL_COMMUNITY): Payer: Self-pay | Admitting: *Deleted

## 2013-01-03 ENCOUNTER — Encounter (HOSPITAL_COMMUNITY)
Admission: EM | Disposition: A | Discharge status: Home / Self Care | Payer: Self-pay | Source: Home / Self Care | Attending: Emergency Medicine

## 2013-01-03 ENCOUNTER — Observation Stay (HOSPITAL_COMMUNITY): Payer: Medicare Other | Admitting: Anesthesiology

## 2013-01-03 ENCOUNTER — Encounter (HOSPITAL_COMMUNITY): Payer: Self-pay | Admitting: Anesthesiology

## 2013-01-03 HISTORY — PX: LIVER BIOPSY: SHX301

## 2013-01-03 HISTORY — PX: CHOLECYSTECTOMY: SHX55

## 2013-01-03 LAB — SURGICAL PCR SCREEN
MRSA, PCR: NEGATIVE
Staphylococcus aureus: NEGATIVE

## 2013-01-03 LAB — COMPREHENSIVE METABOLIC PANEL
Albumin: 3.1 g/dL — ABNORMAL LOW (ref 3.5–5.2)
BUN: 13 mg/dL (ref 6–23)
Calcium: 8.8 mg/dL (ref 8.4–10.5)
Creatinine, Ser: 1.13 mg/dL (ref 0.50–1.35)
GFR calc Af Amer: 75 mL/min — ABNORMAL LOW (ref >90)
Glucose, Bld: 95 mg/dL (ref 70–99)
Potassium: 4 mEq/L (ref 3.5–5.1)
Total Protein: 6.3 g/dL (ref 6.0–8.3)

## 2013-01-03 LAB — CBC
HCT: 43.1 % (ref 39.0–52.0)
Hemoglobin: 14.2 g/dL (ref 13.0–17.0)
MCV: 101.2 fL — ABNORMAL HIGH (ref 78.0–100.0)
RBC: 4.26 MIL/uL (ref 4.22–5.81)
WBC: 4.1 10*3/uL (ref 4.0–10.5)

## 2013-01-03 SURGERY — LAPAROSCOPIC CHOLECYSTECTOMY
Anesthesia: General | Site: Abdomen | Wound class: Contaminated

## 2013-01-03 MED ORDER — SUCCINYLCHOLINE CHLORIDE 20 MG/ML IJ SOLN
INTRAMUSCULAR | Status: DC | PRN
  Administered 2013-01-03: 170 mg via INTRAVENOUS

## 2013-01-03 MED ORDER — FENTANYL CITRATE 0.05 MG/ML IJ SOLN
INTRAMUSCULAR | Status: AC
  Filled 2013-01-03: qty 2

## 2013-01-03 MED ORDER — BUPIVACAINE HCL (PF) 0.5 % IJ SOLN
INTRAMUSCULAR | Status: AC
  Filled 2013-01-03: qty 30

## 2013-01-03 MED ORDER — GLYCOPYRROLATE 0.2 MG/ML IJ SOLN
INTRAMUSCULAR | Status: AC
  Filled 2013-01-03: qty 2

## 2013-01-03 MED ORDER — ONDANSETRON HCL 4 MG/2ML IJ SOLN
INTRAMUSCULAR | Status: AC
  Filled 2013-01-03: qty 2

## 2013-01-03 MED ORDER — FENTANYL CITRATE 0.05 MG/ML IJ SOLN
25.0000 ug | INTRAMUSCULAR | Status: AC
  Administered 2013-01-03 (×2): 25 ug via INTRAVENOUS

## 2013-01-03 MED ORDER — BUPIVACAINE HCL (PF) 0.5 % IJ SOLN
INTRAMUSCULAR | Status: DC | PRN
  Administered 2013-01-03: 12 mL

## 2013-01-03 MED ORDER — FENTANYL CITRATE 0.05 MG/ML IJ SOLN
25.0000 ug | INTRAMUSCULAR | Status: DC | PRN
  Administered 2013-01-03: 50 ug via INTRAVENOUS

## 2013-01-03 MED ORDER — CLINDAMYCIN PHOSPHATE 900 MG/50ML IV SOLN
INTRAVENOUS | Status: AC
  Filled 2013-01-03: qty 50

## 2013-01-03 MED ORDER — SUCCINYLCHOLINE CHLORIDE 20 MG/ML IJ SOLN
INTRAMUSCULAR | Status: AC
  Filled 2013-01-03: qty 1

## 2013-01-03 MED ORDER — OXYCODONE-ACETAMINOPHEN 7.5-325 MG PO TABS: 1.0000 | ORAL_TABLET | ORAL | Status: DC | PRN

## 2013-01-03 MED ORDER — NEOSTIGMINE METHYLSULFATE 1 MG/ML IJ SOLN
INTRAMUSCULAR | Status: DC | PRN
  Administered 2013-01-03 (×2): 1 mg via INTRAVENOUS

## 2013-01-03 MED ORDER — ONDANSETRON HCL 4 MG/2ML IJ SOLN
4.0000 mg | Freq: Once | INTRAMUSCULAR | Status: AC
  Administered 2013-01-03: 4 mg via INTRAVENOUS

## 2013-01-03 MED ORDER — MIDAZOLAM HCL 2 MG/2ML IJ SOLN
INTRAMUSCULAR | Status: AC
  Filled 2013-01-03: qty 2

## 2013-01-03 MED ORDER — FENTANYL CITRATE 0.05 MG/ML IJ SOLN
INTRAMUSCULAR | Status: AC
  Filled 2013-01-03: qty 5

## 2013-01-03 MED ORDER — ROCURONIUM BROMIDE 50 MG/5ML IV SOLN
INTRAVENOUS | Status: AC
  Filled 2013-01-03: qty 1

## 2013-01-03 MED ORDER — LIDOCAINE HCL 1 % IJ SOLN
INTRAMUSCULAR | Status: DC | PRN
  Administered 2013-01-03: 50 mg via INTRADERMAL

## 2013-01-03 MED ORDER — KETOROLAC TROMETHAMINE 30 MG/ML IJ SOLN
INTRAMUSCULAR | Status: AC
  Filled 2013-01-03: qty 1

## 2013-01-03 MED ORDER — LACTATED RINGERS IV SOLN
INTRAVENOUS | Status: DC
  Administered 2013-01-03 (×2): via INTRAVENOUS

## 2013-01-03 MED ORDER — PROPOFOL 10 MG/ML IV BOLUS
INTRAVENOUS | Status: DC | PRN
  Administered 2013-01-03: 170 mg via INTRAVENOUS

## 2013-01-03 MED ORDER — MIDAZOLAM HCL 2 MG/2ML IJ SOLN
1.0000 mg | INTRAMUSCULAR | Status: DC | PRN
  Administered 2013-01-03: 2 mg via INTRAVENOUS

## 2013-01-03 MED ORDER — KETOROLAC TROMETHAMINE 30 MG/ML IJ SOLN
30.0000 mg | Freq: Once | INTRAMUSCULAR | Status: AC
  Administered 2013-01-03: 30 mg via INTRAVENOUS

## 2013-01-03 MED ORDER — MIDAZOLAM HCL 5 MG/5ML IJ SOLN
INTRAMUSCULAR | Status: DC | PRN
  Administered 2013-01-03: 2 mg via INTRAVENOUS

## 2013-01-03 MED ORDER — DEXAMETHASONE SODIUM PHOSPHATE 4 MG/ML IJ SOLN
INTRAMUSCULAR | Status: AC
  Filled 2013-01-03: qty 1

## 2013-01-03 MED ORDER — PROPOFOL 10 MG/ML IV EMUL
INTRAVENOUS | Status: AC
  Filled 2013-01-03: qty 20

## 2013-01-03 MED ORDER — GLYCOPYRROLATE 0.2 MG/ML IJ SOLN
INTRAMUSCULAR | Status: DC | PRN
  Administered 2013-01-03: 0.4 mg via INTRAVENOUS

## 2013-01-03 MED ORDER — DEXAMETHASONE SODIUM PHOSPHATE 4 MG/ML IJ SOLN
4.0000 mg | Freq: Once | INTRAMUSCULAR | Status: AC
  Administered 2013-01-03: 4 mg via INTRAVENOUS

## 2013-01-03 MED ORDER — ONDANSETRON HCL 4 MG/2ML IJ SOLN: 4.0000 mg | Freq: Once | INTRAMUSCULAR | Status: DC | PRN

## 2013-01-03 MED ORDER — FENTANYL CITRATE 0.05 MG/ML IJ SOLN
INTRAMUSCULAR | Status: DC | PRN
  Administered 2013-01-03: 100 ug via INTRAVENOUS
  Administered 2013-01-03: 50 ug via INTRAVENOUS
  Administered 2013-01-03: 100 ug via INTRAVENOUS

## 2013-01-03 MED ORDER — HEMOSTATIC AGENTS (NO CHARGE) OPTIME
TOPICAL | Status: DC | PRN
  Administered 2013-01-03: 1 via TOPICAL

## 2013-01-03 MED ORDER — ROCURONIUM BROMIDE 100 MG/10ML IV SOLN
INTRAVENOUS | Status: DC | PRN
  Administered 2013-01-03: 25 mg via INTRAVENOUS

## 2013-01-03 MED ORDER — LIDOCAINE HCL (PF) 1 % IJ SOLN
INTRAMUSCULAR | Status: AC
  Filled 2013-01-03: qty 5

## 2013-01-03 MED ORDER — SODIUM CHLORIDE 0.9 % IR SOLN
Status: DC | PRN
  Administered 2013-01-03: 1000 mL

## 2013-01-03 SURGICAL SUPPLY — 46 items
APPLIER CLIP LAPSCP 10X32 DD (CLIP) ×2 IMPLANT
BAG HAMPER (MISCELLANEOUS) ×2 IMPLANT
BAG SPEC RTRVL LRG 6X4 10 (ENDOMECHANICALS) ×1
CLOTH BEACON ORANGE TIMEOUT ST (SAFETY) ×2 IMPLANT
COVER LIGHT HANDLE STERIS (MISCELLANEOUS) ×4 IMPLANT
CUTTER LINEAR ENDO 35 ETS TH (STAPLE) ×1 IMPLANT
DECANTER SPIKE VIAL GLASS SM (MISCELLANEOUS) ×2 IMPLANT
DURAPREP 26ML APPLICATOR (WOUND CARE) ×2 IMPLANT
ELECT REM PT RETURN 9FT ADLT (ELECTROSURGICAL) ×2
ELECTRODE REM PT RTRN 9FT ADLT (ELECTROSURGICAL) ×1 IMPLANT
FILTER SMOKE EVAC LAPAROSHD (FILTER) ×2 IMPLANT
FORMALIN 10 PREFIL 120ML (MISCELLANEOUS) ×2 IMPLANT
GLOVE BIO SURGEON STRL SZ7.5 (GLOVE) ×2 IMPLANT
GLOVE BIOGEL PI IND STRL 7.0 (GLOVE) IMPLANT
GLOVE BIOGEL PI INDICATOR 7.0 (GLOVE) ×2
GLOVE ECLIPSE 6.5 STRL STRAW (GLOVE) ×1 IMPLANT
GLOVE EXAM NITRILE MD LF STRL (GLOVE) ×1 IMPLANT
GLOVE SS BIOGEL STRL SZ 6.5 (GLOVE) IMPLANT
GLOVE SUPERSENSE BIOGEL SZ 6.5 (GLOVE) ×1
GOWN STRL REIN XL XLG (GOWN DISPOSABLE) ×6 IMPLANT
HEMOSTAT SNOW SURGICEL 2X4 (HEMOSTASIS) ×2 IMPLANT
INST SET LAPROSCOPIC AP (KITS) ×2 IMPLANT
IV NS IRRIG 3000ML ARTHROMATIC (IV SOLUTION) IMPLANT
KIT ROOM TURNOVER APOR (KITS) ×2 IMPLANT
MANIFOLD NEPTUNE II (INSTRUMENTS) ×2 IMPLANT
NDL BIOPSY 14X6 SOFT TISS (NEEDLE) IMPLANT
NDL INSUFFLATION 14GA 120MM (NEEDLE) ×1 IMPLANT
NEEDLE BIOPSY 14X6 SOFT TISS (NEEDLE) ×2 IMPLANT
NEEDLE INSUFFLATION 14GA 120MM (NEEDLE) ×2 IMPLANT
NS IRRIG 1000ML POUR BTL (IV SOLUTION) ×2 IMPLANT
PACK LAP CHOLE LZT030E (CUSTOM PROCEDURE TRAY) ×2 IMPLANT
PAD ARMBOARD 7.5X6 YLW CONV (MISCELLANEOUS) ×2 IMPLANT
POUCH SPECIMEN RETRIEVAL 10MM (ENDOMECHANICALS) ×2 IMPLANT
SET BASIN LINEN APH (SET/KITS/TRAYS/PACK) ×2 IMPLANT
SET TUBE IRRIG SUCTION NO TIP (IRRIGATION / IRRIGATOR) IMPLANT
SLEEVE Z-THREAD 5X100MM (TROCAR) ×2 IMPLANT
SPONGE GAUZE 2X2 8PLY STRL LF (GAUZE/BANDAGES/DRESSINGS) ×8 IMPLANT
STAPLER VISISTAT (STAPLE) ×2 IMPLANT
SUT VICRYL 0 UR6 27IN ABS (SUTURE) ×2 IMPLANT
TAPE CLOTH SURG 4X10 WHT LF (GAUZE/BANDAGES/DRESSINGS) ×1 IMPLANT
TROCAR ENDO BLADELESS 11MM (ENDOMECHANICALS) ×2 IMPLANT
TROCAR XCEL UNIV SLVE 11M 100M (ENDOMECHANICALS) ×2 IMPLANT
TROCAR Z-THREAD FIOS 5X100MM (TROCAR) ×2 IMPLANT
TUBING INSUFFLATION (TUBING) ×2 IMPLANT
WARMER LAPAROSCOPE (MISCELLANEOUS) ×2 IMPLANT
YANKAUER SUCT 12FT TUBE ARGYLE (SUCTIONS) ×2 IMPLANT

## 2013-01-03 NOTE — Anesthesia Postprocedure Evaluation (Signed)
  Anesthesia Post-op Note  Patient: Wayne Ortiz  Procedure(s) Performed: Procedure(s): LAPAROSCOPIC CHOLECYSTECTOMY ([name]) LIVER BIOPSY ([name])  Patient Location: PACU  Anesthesia Type:General  Level of Consciousness: awake, alert , oriented and patient cooperative  Airway and Oxygen Therapy: Patient Spontanous Breathing  Post-op Pain: 2 /10, mild  Post-op Assessment: Post-op Vital signs reviewed, Patient's Cardiovascular Status Stable, Respiratory Function Stable, Patent Airway, No signs of Nausea or vomiting and Pain level controlled  Post-op Vital Signs: Reviewed and stable  Complications: No apparent anesthesia complications

## 2013-01-03 NOTE — Accreditation Note (Signed)
Patient states understanding of discharge instructions, prescriptions given. Patient has tolerated food, urinated and ambulated without difficulty.

## 2013-01-03 NOTE — Progress Notes (Signed)
Subjective; Patient feels fine. He denies abdominal pain nausea or vomiting. He is hungry. Objective; BP 138/89  Pulse 62  Temp(Src) 98.3 F (36.8 C) (Oral)  Resp 20  Ht 5\' 11"  (1.803 m)  Wt 263 lb 0.1 oz (119.3 kg)  BMI 36.7 kg/m2  SpO2 95% Abdomen is full but soft and nontender. Lab data; WBC 4.1, H&H 14.2 and 43.1 and platelet count 107K Serum amylase 19 Bilirubin 2.3, AB 114, AST 62, ALT 114 and albumin 3.1. Metabolic 7 is normal and calcium is 8.8 Assessment; Patient is doing well status post ERCP with sphincterotomy and stone extraction yesterday. Bilirubin and transaminases are coming down. Patient for laparoscopic cholecystectomy today. Patient has thrombocytopenia raising possibility of underlying cirrhosis among other things. Recommendations; H. pylori serology. Liver biopsy at the time of cholecystectomy if liver appears to be abnormal.

## 2013-01-03 NOTE — Progress Notes (Signed)
01/03/13 1321  OBSTRUCTIVE SLEEP APNEA  Have you ever been diagnosed with sleep apnea through a sleep study? No  Do you snore loudly (loud enough to be heard through closed doors)?  0  Do you often feel tired, fatigued, or sleepy during the daytime? 0  Has anyone observed you stop breathing during your sleep? 0  Do you have, or are you being treated for high blood pressure? 1  BMI more than 35 kg/m2? 1  Age over 68 years old? 1  Neck circumference greater than 40 cm/18 inches? 0  Gender: 1  Obstructive Sleep Apnea Score 4  Score 4 or greater  Results sent to PCP

## 2013-01-03 NOTE — Anesthesia Preprocedure Evaluation (Signed)
Anesthesia Evaluation  Patient identified by MRN, date of birth, ID band Patient awake    Reviewed: Allergy & Precautions, H&P , NPO status , Patient's Chart, lab work & pertinent test results, reviewed documented beta blocker date and time   History of Anesthesia Complications (+) Emergence Delirium  Airway Mallampati: III TM Distance: >3 FB Neck ROM: Full    Dental  (+) Partial Lower, Partial Upper and Dental Advisory Given   Pulmonary  breath sounds clear to auscultation        Cardiovascular hypertension, Pt. on medications + dysrhythmias Rhythm:Regular  History of Afib Cardioverted 2012 On meds  No current problems   Neuro/Psych Status post cervical spine surgery good range of motion    GI/Hepatic   Endo/Other  Hypothyroidism   Renal/GU      Musculoskeletal  (+) Arthritis -, Osteoarthritis,    Abdominal   Peds  Hematology  (+) Blood dyscrasia, , Chronic thrombocytopenia   Anesthesia Other Findings Facial hair  Reproductive/Obstetrics                           Anesthesia Physical Anesthesia Plan  ASA: III  Anesthesia Plan: General   Post-op Pain Management:    Induction: Intravenous  Airway Management Planned: Oral ETT  Additional Equipment:   Intra-op Plan:   Post-operative Plan: Extubation in OR  Informed Consent: I have reviewed the patients History and Physical, chart, labs and discussed the procedure including the risks, benefits and alternatives for the proposed anesthesia with the patient or authorized representative who has indicated his/her understanding and acceptance.   Dental advisory given  Plan Discussed with:   Anesthesia Plan Comments:         Anesthesia Quick Evaluation

## 2013-01-03 NOTE — Anesthesia Procedure Notes (Signed)
Procedure Name: Intubation Date/Time: 01/03/2013 2:36 PM Performed by: Despina Hidden Pre-anesthesia Checklist: Emergency Drugs available, Suction available, Patient being monitored and Patient identified Patient Re-evaluated:Patient Re-evaluated prior to inductionOxygen Delivery Method: Circle system utilized Preoxygenation: Pre-oxygenation with 100% oxygen Intubation Type: IV induction, Cricoid Pressure applied and Rapid sequence Ventilation: Mask ventilation with difficulty, Two handed mask ventilation required and Oral airway inserted - appropriate to patient size Laryngoscope Size: Mac and 4 Grade View: Grade I Tube type: Oral Tube size: 8.0 mm Number of attempts: 1 Airway Equipment and Method: Stylet Placement Confirmation: ETT inserted through vocal cords under direct vision,  positive ETCO2 and breath sounds checked- equal and bilateral Secured at: 20 cm Tube secured with: Tape Dental Injury: Teeth and Oropharynx as per pre-operative assessment  Difficulty Due To: Difficulty was anticipated Comments: Difficult due to full beard

## 2013-01-03 NOTE — Transfer of Care (Signed)
Immediate Anesthesia Transfer of Care Note  Patient: Wayne Ortiz  Procedure(s) Performed: Procedure(s): LAPAROSCOPIC CHOLECYSTECTOMY ([name]) LIVER BIOPSY ([name])  Patient Location: PACU  Anesthesia Type:General  Level of Consciousness: awake and patient cooperative  Airway & Oxygen Therapy: Patient Spontanous Breathing and Patient connected to face mask oxygen  Post-op Assessment: Report given to PACU RN, Post -op Vital signs reviewed and stable and Patient moving all extremities  Post vital signs: Reviewed and stable  Complications: No apparent anesthesia complications

## 2013-01-03 NOTE — Progress Notes (Signed)
Wayne Ortiz, Wayne Ortiz                 ACCOUNT NO.:  [id number]  MEDICAL RECORD NO.:  [id number]  LOCATION:  A309                          FACILITY:  [facility]  PHYSICIAN:  Rexanne Inocencio G. Renard Matter, MD   DATE OF BIRTH:  [birth year]  DATE OF PROCEDURE: DATE OF DISCHARGE:                                PROGRESS NOTE   SUBJECTIVE:  This patient is fairly comfortable today.  He did have ERCP with extraction of stone from common bile duct.  Hepatitis surface antigen negative.  He still has elevation of liver enzymes.  AST 67, ALT 140, bilirubin 3.2.  OBJECTIVE:  VITAL SIGNS:  Blood pressure 138/89, respirations 20, pulse 62, temperature 98.3. HEENT:  Negative. NECK:  Supple.  No JVD or thyroid abnormalities. HEART:  Regular rhythm.  No murmurs. LUNGS:  Clear to P and A. ABDOMEN:  No palpable organs or masses.  ASSESSMENT:  Right upper quadrant pain secondary to choledocholithiasis, single stone, which were  removed.  Thickening of gallbladder wall. Hyperbilirubinemia, previous diagnosis of cirrhosis.  PLAN:  To proceed with surgery today, cholecystectomy and possible liver biopsy.  The patient's condition stable.     Whitman Meinhardt G. Renard Matter, MD     AGM/MEDQ  D:  01/03/2013  T:  01/03/2013  Job:  [id number]

## 2013-01-03 NOTE — Op Note (Signed)
Patient:  Wayne Ortiz  DOB:  Jan 14, 1946  MRN:  [id number]   Preop Diagnosis:  Cholecystitis, cholelithiasis  Postop Diagnosis:  Same  Procedure:  Laparoscopic cholecystectomy, liver biopsy  Surgeon:  Franky Macho, M.D.  Anes:  General endotracheal  Indications:  Patient is a 67 year old white male who presented to the emergency room with worsening right upper quadrant abdominal pain. He was developing elevated liver enzyme tests, thus he underwent an ERCP with stone extraction by Dr. Karilyn Cota for choledocholithiasis. He now presents for laparoscopic cholecystectomy. Dr. Karilyn Cota has also requested a liver biopsy. The risks and benefits of the procedure including bleeding, infection, hepatobiliary injury, and the possibility of an open procedure were fully explained to the patient, who gave informed consent.  Procedure note:  The patient is placed the supine position. After induction of general endotracheal anesthesia, the abdomen was prepped and draped using usual sterile technique with DuraPrep. Surgical site confirmation was performed.  Supraumbilical incision was made down to the fascia. A Veress needle was introduced into the abdominal cavity and confirmation of placement was done using the saline drop test. The abdomen was then insufflated to 16 mm mercury pressure. An 11 mm trocar was introduced into the abdominal cavity under direct visualization without difficulty. Patient was placed in reverse Trendelenburg position and additional 11 mm trocar was placed the epigastric region. 5 mm trochars were placed the right upper quadrant right flank regions. The was inspected and there was no evidence of macronodular changes. A Tru-Cut needle biopsy was taken of the right lobe of liver and sent to pathology further examination. The biopsy site was cauterized using Bovie electrocautery. The gallbladder was noted to be thickened and distended. Incision was made into the fundus of the gallbladder and  hydrops of the gallbladder was found. The gallbladder was decompressed using suction. The gallbladder was then retracted in a dynamic fashion in order to expose the triangle of Calot. The cystic duct was first identified. Its juncture to the infundibulum was fully identified. A vascular Endo GIA was placed across the cystic duct and fired. The cystic artery was identified, clipped, and ligated. The gallbladder was then freed away from the gallbladder fossa using Bovie electrocautery. The gallbladder was delivered through the epigastric trocar site using an Endo Catch bag. The gallbladder fossa was inspected and no abnormal bleeding or bile leakage was noted. Surgicel is placed the gallbladder fossa. All fluid and air were then evacuated from the abdominal cavity prior to removal of the trochars.  All wounds were irrigated with normal saline. All wounds were injected with 0.5% Sensorcaine. The supraumbilical fascia as well as epigastric fascia were reapproximated using 0 Vicryl interrupted sutures. All skin incisions were closed using staples. Betadine ointment and dry sterile dressings were applied.  All tape and needle counts were correct at the end of the procedure. Patient was extubated in the operating room and transferred to PACU in stable condition.  Complications:  None  EBL:  Minimal  Specimen:  Gallbladder, liver biopsy

## 2013-01-04 ENCOUNTER — Encounter (HOSPITAL_COMMUNITY): Payer: Self-pay | Admitting: Internal Medicine

## 2013-01-04 LAB — H. PYLORI ANTIBODY, IGG: H Pylori IgG: 1.19 {ISR} — ABNORMAL HIGH

## 2013-01-04 NOTE — Discharge Summary (Signed)
Physician Discharge Summary  Patient ID: Wayne Ortiz MRN: [id number] DOB/AGE: [birth year] 67 y.o.  Admit date: 12/31/2012 Discharge date: 01/03/2013  Admission Diagnoses: Abdominal pain  Discharge Diagnoses: Cholecystitis, cholelithiasis, choledocholithiasis Principal Problem:   Abdominal pain, right upper quadrant Active Problems:   Abnormal transaminases   Thrombocytopenia, unspecified   Discharged Condition: good  Hospital Course: Patient is a 67 year old white male who presents to the emergency room on 12/31/2012 with worsening right upper quadrant abdominal pain. Ultrasound of the gallbladder did reveal gallbladder wall thickening. He was also noted to have an increasing total bilirubin level. He was admitted to the hospital for further evaluation treatment. The following day, he was noted to have hyperbilirubinemia. His LFTs and also increased. Dr. Karilyn Cota of GI was consulted and the patient subsequently underwent an ERCP with stone extraction on 01/02/2013. He tolerated procedure well. Subsequent he underwent laparoscopic cholecystectomy on 01/03/2013. Tolerated surgery well. His diet was advanced without difficulty. He was discharged home in good improving condition on 01/03/2013  Consults: GI and general surgery  Treatments: surgery: ERCP with stone extraction on 01/02/2013 Laparoscopic cholecystectomy on 01/03/2013  Discharge Exam: Blood pressure 146/80, pulse 62, temperature 97.7 F (36.5 C), temperature source Oral, resp. rate 18, height 5\' 11"  (1.803 m), weight 119.3 kg (263 lb 0.1 oz), SpO2 93.00%. General appearance: alert, cooperative and no distress Resp: clear to auscultation bilaterally Cardio: regular rate and rhythm, S1, S2 normal, no murmur, click, rub or gallop GI: Soft. Dressings dry and intact.  Disposition: 01-Home or Self Care     Medication List    STOP taking these medications       diclofenac 75 MG EC tablet  Commonly known as:  VOLTAREN       TAKE these medications       ALEVE 220 MG tablet  Generic drug:  naproxen sodium  Take 220 mg by mouth 2 (two) times daily.     allopurinol 300 MG tablet  Commonly known as:  ZYLOPRIM  Take 300 mg by mouth daily.     amiodarone 100 MG tablet  Commonly known as:  PACERONE  Take 100 mg by mouth daily.     aspirin EC 81 MG tablet  Take 81 mg by mouth every morning.     levothyroxine 137 MCG tablet  Commonly known as:  SYNTHROID, LEVOTHROID  Take 137 mcg by mouth daily before breakfast.     lisinopril 20 MG tablet  Commonly known as:  PRINIVIL,ZESTRIL  Take 20 mg by mouth daily.     MYLANTA 200-200-20 MG/5ML suspension  Generic drug:  alum & mag hydroxide-simeth  Take 15 mLs by mouth once as needed for indigestion.     oxyCODONE-acetaminophen 5-325 MG per tablet  Commonly known as:  PERCOCET  Take 2 tablets by mouth every 4 (four) hours as needed for pain.     oxyCODONE-acetaminophen 7.5-325 MG per tablet  Commonly known as:  PERCOCET  Take 1-2 tablets by mouth every 4 (four) hours as needed.           Follow-up Information   Follow up with Dalia Heading, MD. Schedule an appointment as soon as possible for a visit on 01/11/2013.   Specialty:  General Surgery   Contact information:   [address] [place] [address] [phone]       Signed: Franky Macho A 01/04/2013, 5:23 PM

## 2013-01-05 ENCOUNTER — Encounter (HOSPITAL_COMMUNITY): Payer: Self-pay | Admitting: General Surgery

## 2013-01-07 ENCOUNTER — Telehealth: Payer: Self-pay | Admitting: Internal Medicine

## 2013-01-07 ENCOUNTER — Other Ambulatory Visit (INDEPENDENT_AMBULATORY_CARE_PROVIDER_SITE_OTHER): Payer: Self-pay | Admitting: Internal Medicine

## 2013-01-07 MED ORDER — BIS SUBCIT-METRONID-TETRACYC 140-125-125 MG PO CAPS: 3.0000 | ORAL_CAPSULE | Freq: Three times a day (TID) | ORAL | Status: DC

## 2013-01-07 NOTE — Telephone Encounter (Signed)
Pt called to tell Dr. Julien Nordmann too expensive($300 out of pocket).  Needs cheaper meds.  I told him I'd send this communication to Dr. Karilyn Cota and his office should be in contact with him the first of the week with an alternative treatment regimen

## 2013-01-10 NOTE — Telephone Encounter (Signed)
Will use tetracycline 500 mg 3 times a day for 2 weeks, metronidazole 500 mg 3 times a day for 2 weeks and Pepto-Bismol 2 tablets 4 times a day

## 2013-01-12 NOTE — Telephone Encounter (Signed)
On 01/10/13 the prescription as noted below was called per Dr.Rehman. We recd's another call from the pharmacy that the Tetracycline was no longer available. Per Dr.Rehman may call in the following; Amoxiicillin 1 gram po twice a day for 2 weeks, Clarithromycin 500 mg po twice a day for 2 weeks , and Prilosec 20 mg by mouth twice a day .  This was called to the [facility] on 01/11/13 and left on the Pharmacist voicemail.

## 2013-01-17 NOTE — Progress Notes (Signed)
LM to return the call to schedule a f/u apt.  

## 2013-01-18 ENCOUNTER — Telehealth (INDEPENDENT_AMBULATORY_CARE_PROVIDER_SITE_OTHER): Payer: Self-pay | Admitting: *Deleted

## 2013-01-18 NOTE — Progress Notes (Signed)
Apt has been scheduled for 07/04/13 with Dr. Karilyn Cota.

## 2013-01-18 NOTE — Telephone Encounter (Signed)
Wayne Ortiz is having yellow mucus with a bitter taste coming up after he eat and at bed time. His return phone number is [phone].

## 2013-01-19 NOTE — Telephone Encounter (Signed)
Patient can stop antibiotics.

## 2013-01-19 NOTE — Telephone Encounter (Signed)
I have talked with Wayne Ortiz. He says that he feels bad. He feels that this is related to the antibiotics. He has 5 more days of the medication to take be fore the therapy is completed. Patient c/o nausea,burping,gas. Stools were very dark but are now normal. Patient was advised that Dr.Rehman would be made aware and we would call him back with his recommendation.

## 2013-01-19 NOTE — Telephone Encounter (Signed)
Patient called and given Dr.Rehman's recommendation. He was very happy. Patient will call about Friday and let us know how he is doing.

## 2013-01-24 ENCOUNTER — Telehealth (INDEPENDENT_AMBULATORY_CARE_PROVIDER_SITE_OTHER): Payer: Self-pay | Admitting: *Deleted

## 2013-01-24 NOTE — Telephone Encounter (Signed)
Noted and Dr.Rehman will be made aware. 

## 2013-01-24 NOTE — Telephone Encounter (Signed)
FYI = He is doing a lot better after stopping the antibiotics. His return phone number is [phone].

## 2013-05-20 ENCOUNTER — Encounter (HOSPITAL_COMMUNITY): Payer: Self-pay | Admitting: Pharmacy Technician

## 2013-05-22 NOTE — H&P (Signed)
TOTAL KNEE ADMISSION H&P  Patient is being admitted for right total knee arthroplasty.  Subjective:  Chief Complaint:     Right knee OA / pain.  HPI: Wayne Ortiz, 68 y.o. male, has a history of pain and functional disability in the right knee due to arthritis and has failed non-surgical conservative treatments for greater than 12 weeks to includeNSAID's and/or analgesics, corticosteriod injections and activity modification.  Onset of symptoms was gradual, starting 3+ years ago with gradually worsening course since that time. The patient noted prior procedures on the knee to include  menisectomy on the right knee(s).  Patient currently rates pain in the right knee(s) at 7 out of 10 with activity. Patient has worsening of pain with activity and weight bearing, pain that interferes with activities of daily living, pain with passive range of motion, crepitus and joint swelling.  Patient has evidence of periarticular osteophytes and joint space narrowing by imaging studies.  There is no active infection.  Risks, benefits and expectations were discussed with the patient.  Risks including but not limited to the risk of anesthesia, blood clots, nerve damage, blood vessel damage, failure of the prosthesis, infection and up to and including death.  Patient understand the risks, benefits and expectations and wishes to proceed with surgery.   D/C Plans:   Home with HHPT  Post-op Meds:    No Rx given  Tranexamic Acid:   To be given  Decadron:    To be given  FYI:    ASA post-op  Norco post-op   Patient Active Problem List   Diagnosis Date Noted  . Abdominal pain, right upper quadrant 12/31/2012  . Abnormal transaminases 12/31/2012  . Thrombocytopenia, unspecified 12/31/2012   Past Medical History  Diagnosis Date  . Thyroid disease   . Hypertension   . Gout   . Femur fracture   . Atrial fibrillation     Past Surgical History  Procedure Laterality Date  . Cervical spine surgery    . Fracture  surgery      left arm  . Femur fracture surgery    . Ercp [name] 01/02/2013    Procedure: ENDOSCOPIC RETROGRADE CHOLANGIOPANCREATOGRAPHY (ERCP);  Surgeon: [name], MD;  Location: [name];  Service: Endoscopy;  Laterality: [name];  . Sphincterotomy [name] 01/02/2013    Procedure: SPHINCTEROTOMY;  Surgeon: [name], MD;  Location: [name];  Service: Endoscopy;  Laterality: [name];  Stone Extraction  . Cholecystectomy [name] 01/03/2013    Procedure: LAPAROSCOPIC CHOLECYSTECTOMY;  Surgeon: [name], MD;  Location: [name];  Service: General;  Laterality: [name];  . Liver biopsy [name] 01/03/2013    Procedure: LIVER BIOPSY;  Surgeon: [name], MD;  Location: [name];  Service: General;  Laterality: [name];    No prescriptions prior to admission   No Known Allergies   History  Substance Use Topics  . Smoking status: Never Smoker   . Smokeless tobacco: Not on file  . Alcohol Use: Yes    Family History  Problem Relation Age of Onset  . Kidney Stones Mother      Review of Systems  Constitutional: Negative.   HENT: Negative.   Eyes: Negative.   Respiratory: Negative.   Cardiovascular: Negative.   Gastrointestinal: Negative.   Genitourinary: Negative.   Musculoskeletal: Positive for joint pain.  Skin: Negative.   Neurological: Negative.   Endo/Heme/Allergies: Negative.   Psychiatric/Behavioral: Negative.     Objective:  Physical Exam  Constitutional: He is oriented to  person, place, and time. He appears well-developed and well-nourished.  HENT:  Head: Normocephalic and atraumatic.  Mouth/Throat: Oropharynx is clear and moist.  Eyes: Pupils are equal, round, and reactive to light.  Neck: Neck supple. No JVD present. No tracheal deviation present. No thyromegaly present.  Cardiovascular: Normal rate, regular rhythm, normal heart sounds and intact distal pulses.   Respiratory: Effort normal and breath sounds normal. No stridor. No respiratory distress. He has no wheezes.  GI: Soft.  There is no tenderness. There is no guarding.  Musculoskeletal:       Right knee: He exhibits decreased range of motion, swelling and bony tenderness. He exhibits no effusion, no deformity, no laceration and no erythema. Tenderness found.  Lymphadenopathy:    He has no cervical adenopathy.  Neurological: He is alert and oriented to person, place, and time.  Skin: Skin is warm and dry.  Psychiatric: He has a normal mood and affect.     Labs:  Estimated body mass index is 36.70 kg/(m^2) as calculated from the following:   Height as of 12/31/12: 5\' 11"  (1.803 m).   Weight as of 12/31/12: 119.3 kg (263 lb 0.1 oz).   Imaging Review Plain radiographs demonstrate severe degenerative joint disease of the right knee(s). The overall alignment is neutral. The bone quality appears to be good for age and reported activity level.  Assessment/Plan:  End stage arthritis, right knee   The patient history, physical examination, clinical judgment of the provider and imaging studies are consistent with end stage degenerative joint disease of the right knee(s) and total knee arthroplasty is deemed medically necessary. The treatment options including medical management, injection therapy arthroscopy and arthroplasty were discussed at length. The risks and benefits of total knee arthroplasty were presented and reviewed. The risks due to aseptic loosening, infection, stiffness, patella tracking problems, thromboembolic complications and other imponderables were discussed. The patient acknowledged the explanation, agreed to proceed with the plan and consent was signed. Patient is being admitted for inpatient treatment for surgery, pain control, PT, OT, prophylactic antibiotics, VTE prophylaxis, progressive ambulation and ADL's and discharge planning. The patient is planning to be discharged home with home health services.    [place] Bais   PAC  05/22/2013, 9:17 PM

## 2013-05-23 ENCOUNTER — Encounter (INDEPENDENT_AMBULATORY_CARE_PROVIDER_SITE_OTHER): Payer: Self-pay

## 2013-05-23 ENCOUNTER — Encounter (HOSPITAL_COMMUNITY): Payer: Self-pay

## 2013-05-23 ENCOUNTER — Encounter (HOSPITAL_COMMUNITY)
Admission: RE | Admit: 2013-05-23 | Discharge: 2013-05-23 | Disposition: A | Discharge status: Home / Self Care | Payer: Medicare HMO | Source: Ambulatory Visit | Attending: Orthopedic Surgery | Admitting: Orthopedic Surgery

## 2013-05-23 DIAGNOSIS — Z01812 Encounter for preprocedural laboratory examination: Secondary | ICD-10-CM | POA: Insufficient documentation

## 2013-05-23 DIAGNOSIS — Z01818 Encounter for other preprocedural examination: Secondary | ICD-10-CM | POA: Insufficient documentation

## 2013-05-23 HISTORY — DX: Adverse effect of unspecified anesthetic, initial encounter: T41.45XA

## 2013-05-23 HISTORY — DX: Disorder of the skin and subcutaneous tissue, unspecified: L98.9

## 2013-05-23 HISTORY — DX: Unspecified osteoarthritis, unspecified site: M19.90

## 2013-05-23 HISTORY — DX: Personal history of urinary calculi: Z87.442

## 2013-05-23 HISTORY — DX: Other complications of anesthesia, initial encounter: T88.59XA

## 2013-05-23 HISTORY — DX: Gastro-esophageal reflux disease without esophagitis: K21.9

## 2013-05-23 HISTORY — DX: Benign prostatic hyperplasia without lower urinary tract symptoms: N40.0

## 2013-05-23 HISTORY — DX: Personal history of other diseases of the musculoskeletal system and connective tissue: Z87.39

## 2013-05-23 LAB — URINALYSIS, ROUTINE W REFLEX MICROSCOPIC
Bilirubin Urine: NEGATIVE
GLUCOSE, UA: NEGATIVE mg/dL
HGB URINE DIPSTICK: NEGATIVE
Ketones, ur: NEGATIVE mg/dL
LEUKOCYTES UA: NEGATIVE
Nitrite: NEGATIVE
Protein, ur: NEGATIVE mg/dL
SPECIFIC GRAVITY, URINE: 1.013 (ref 1.005–1.030)
UROBILINOGEN UA: 0.2 mg/dL (ref 0.0–1.0)
pH: 6.5 (ref 5.0–8.0)

## 2013-05-23 LAB — PROTIME-INR
INR: 0.93 (ref 0.00–1.49)
Prothrombin Time: 12.3 seconds (ref 11.6–15.2)

## 2013-05-23 LAB — SURGICAL PCR SCREEN
MRSA, PCR: NEGATIVE
STAPHYLOCOCCUS AUREUS: NEGATIVE

## 2013-05-23 LAB — APTT: aPTT: 31 seconds (ref 24–37)

## 2013-05-23 NOTE — Patient Instructions (Addendum)
Wayne Ortiz  05/23/2013                           YOUR PROCEDURE IS SCHEDULED ON: 05/30/13               PLEASE REPORT TO SHORT STAY CENTER AT : 12:00 PM               CALL THIS NUMBER IF ANY PROBLEMS THE DAY OF SURGERY :               [phone]                      REMEMBER:   Do not eat food or drink liquids AFTER MIDNIGHT  May have clear liquids UNTIL 6 HOURS BEFORE SURGERY (9:00 AM)  Clear liquids include soda, tea, black coffee, apple or grape juice, broth.  Take these medicines the morning of surgery with A SIP OF WATER:  ALLOPURINOL / PACERONE / LEVOTHYROXINE / TAMSULOSIN   Do not wear jewelry, make-up   Do not wear lotions, powders, or perfumes.   Do not shave legs or underarms 12 hrs. before surgery (men may shave face)  Do not bring valuables to the hospital.  Contacts, dentures or bridgework may not be worn into surgery.  Leave suitcase in the car. After surgery it may be brought to your room.  For patients admitted to the hospital more than one night, checkout time is 11:00                          The day of discharge.   Patients discharged the day of surgery will not be allowed to drive home                             If going home same day of surgery, must have someone stay with you first                           24 hrs at home and arrange for some one to drive you home from hospital.    Special Instructions:   Please read over the following fact sheets that you were given:               1. MRSA  INFORMATION                      2. [place]               3. [place]                                                X_____________________________________________________________________        Failure to follow these instructions may result in cancellation of your surgery

## 2013-05-23 NOTE — Progress Notes (Signed)
05/23/13 1027  OBSTRUCTIVE SLEEP APNEA  Have you ever been diagnosed with sleep apnea through a sleep study? No  Do you snore loudly (loud enough to be heard through closed doors)?  1  Do you often feel tired, fatigued, or sleepy during the daytime? 1  Has anyone observed you stop breathing during your sleep? 0  Do you have, or are you being treated for high blood pressure? 1  BMI more than 35 kg/m2? 1  Age over 69 years old? 1  Neck circumference greater than 40 cm/18 inches? 1  Gender: 1  Obstructive Sleep Apnea Score 7  Score 4 or greater  Results sent to PCP

## 2013-05-29 MED ORDER — DEXTROSE 5 % IV SOLN
3.0000 g | INTRAVENOUS | Status: AC
  Administered 2013-05-30: 3 g via INTRAVENOUS
  Filled 2013-05-29: qty 3000

## 2013-05-30 ENCOUNTER — Encounter (HOSPITAL_COMMUNITY)
Admission: RE | Disposition: A | Discharge status: Home Health | Payer: Self-pay | Source: Ambulatory Visit | Attending: Orthopedic Surgery

## 2013-05-30 ENCOUNTER — Encounter (HOSPITAL_COMMUNITY): Payer: Self-pay | Admitting: *Deleted

## 2013-05-30 ENCOUNTER — Inpatient Hospital Stay (HOSPITAL_COMMUNITY)
Admission: RE | Admit: 2013-05-30 | Discharge: 2013-05-31 | DRG: 470 | Disposition: A | Discharge status: Home Health | Payer: Medicare HMO | Source: Ambulatory Visit | Attending: Orthopedic Surgery | Admitting: Orthopedic Surgery

## 2013-05-30 ENCOUNTER — Inpatient Hospital Stay (HOSPITAL_COMMUNITY): Payer: Medicare HMO | Admitting: Anesthesiology

## 2013-05-30 ENCOUNTER — Encounter (HOSPITAL_COMMUNITY): Payer: Medicare HMO | Admitting: Anesthesiology

## 2013-05-30 DIAGNOSIS — M1710 Unilateral primary osteoarthritis, unspecified knee: Principal | ICD-10-CM | POA: Diagnosis present

## 2013-05-30 DIAGNOSIS — Z96659 Presence of unspecified artificial knee joint: Secondary | ICD-10-CM

## 2013-05-30 DIAGNOSIS — Z87891 Personal history of nicotine dependence: Secondary | ICD-10-CM

## 2013-05-30 DIAGNOSIS — K219 Gastro-esophageal reflux disease without esophagitis: Secondary | ICD-10-CM | POA: Diagnosis present

## 2013-05-30 DIAGNOSIS — E669 Obesity, unspecified: Secondary | ICD-10-CM | POA: Diagnosis present

## 2013-05-30 DIAGNOSIS — I4891 Unspecified atrial fibrillation: Secondary | ICD-10-CM | POA: Diagnosis present

## 2013-05-30 DIAGNOSIS — I10 Essential (primary) hypertension: Secondary | ICD-10-CM | POA: Diagnosis present

## 2013-05-30 DIAGNOSIS — Z01812 Encounter for preprocedural laboratory examination: Secondary | ICD-10-CM

## 2013-05-30 DIAGNOSIS — M6580 Other synovitis and tenosynovitis, unspecified site: Secondary | ICD-10-CM | POA: Diagnosis present

## 2013-05-30 DIAGNOSIS — Z6836 Body mass index (BMI) 36.0-36.9, adult: Secondary | ICD-10-CM

## 2013-05-30 HISTORY — PX: TOTAL KNEE ARTHROPLASTY: SHX125

## 2013-05-30 LAB — TYPE AND SCREEN
ABO/RH(D): A POS
Antibody Screen: NEGATIVE

## 2013-05-30 LAB — ABO/RH: ABO/RH(D): A POS

## 2013-05-30 SURGERY — ARTHROPLASTY, KNEE, TOTAL
Anesthesia: Spinal | Site: Knee | Laterality: Right

## 2013-05-30 MED ORDER — ALLOPURINOL 300 MG PO TABS
300.0000 mg | ORAL_TABLET | Freq: Every morning | ORAL | Status: DC
Start: 2013-05-31 — End: 2013-05-31
  Administered 2013-05-31: 300 mg via ORAL
  Filled 2013-05-30: qty 1

## 2013-05-30 MED ORDER — ASPIRIN EC 325 MG PO TBEC
325.0000 mg | DELAYED_RELEASE_TABLET | Freq: Two times a day (BID) | ORAL | Status: DC
  Administered 2013-05-31: 325 mg via ORAL
  Filled 2013-05-30 (×3): qty 1

## 2013-05-30 MED ORDER — TRANEXAMIC ACID 100 MG/ML IV SOLN
1000.0000 mg | Freq: Once | INTRAVENOUS | Status: AC
  Administered 2013-05-30: 1000 mg via INTRAVENOUS
  Filled 2013-05-30: qty 10

## 2013-05-30 MED ORDER — METHOCARBAMOL 100 MG/ML IJ SOLN
500.0000 mg | Freq: Four times a day (QID) | INTRAVENOUS | Status: DC | PRN
  Administered 2013-05-30: 500 mg via INTRAVENOUS
  Filled 2013-05-30 (×2): qty 5

## 2013-05-30 MED ORDER — HYDROMORPHONE HCL PF 1 MG/ML IJ SOLN: 0.5000 mg | INTRAMUSCULAR | Status: DC | PRN

## 2013-05-30 MED ORDER — ONDANSETRON HCL 4 MG/2ML IJ SOLN
INTRAMUSCULAR | Status: AC
  Filled 2013-05-30: qty 2

## 2013-05-30 MED ORDER — SODIUM CHLORIDE 0.9 % IJ SOLN
INTRAMUSCULAR | Status: AC
  Filled 2013-05-30: qty 50

## 2013-05-30 MED ORDER — MENTHOL 3 MG MT LOZG: 1.0000 | LOZENGE | OROMUCOSAL | Status: DC | PRN

## 2013-05-30 MED ORDER — EPHEDRINE SULFATE 50 MG/ML IJ SOLN
INTRAMUSCULAR | Status: DC | PRN
  Administered 2013-05-30 (×4): 10 mg via INTRAVENOUS

## 2013-05-30 MED ORDER — PROPOFOL INFUSION 10 MG/ML OPTIME
INTRAVENOUS | Status: DC | PRN
  Administered 2013-05-30: 100 ug/kg/min via INTRAVENOUS

## 2013-05-30 MED ORDER — CEFAZOLIN SODIUM-DEXTROSE 2-3 GM-% IV SOLR
2.0000 g | Freq: Four times a day (QID) | INTRAVENOUS | Status: AC
  Administered 2013-05-30 – 2013-05-31 (×2): 2 g via INTRAVENOUS
  Filled 2013-05-30 (×2): qty 50

## 2013-05-30 MED ORDER — PROPOFOL 10 MG/ML IV BOLUS
INTRAVENOUS | Status: AC
  Filled 2013-05-30: qty 20

## 2013-05-30 MED ORDER — ZOLPIDEM TARTRATE 5 MG PO TABS: 5.0000 mg | ORAL_TABLET | Freq: Every evening | ORAL | Status: DC | PRN

## 2013-05-30 MED ORDER — BUPIVACAINE LIPOSOME 1.3 % IJ SUSP
20.0000 mL | Freq: Once | INTRAMUSCULAR | Status: AC
  Administered 2013-05-30: 20 mL
  Filled 2013-05-30: qty 20

## 2013-05-30 MED ORDER — ONDANSETRON HCL 4 MG/2ML IJ SOLN
INTRAMUSCULAR | Status: DC | PRN
  Administered 2013-05-30: 4 mg via INTRAVENOUS

## 2013-05-30 MED ORDER — DOCUSATE SODIUM 100 MG PO CAPS
100.0000 mg | ORAL_CAPSULE | Freq: Two times a day (BID) | ORAL | Status: DC
  Administered 2013-05-30 – 2013-05-31 (×2): 100 mg via ORAL

## 2013-05-30 MED ORDER — POLYETHYLENE GLYCOL 3350 17 G PO PACK
17.0000 g | PACK | Freq: Two times a day (BID) | ORAL | Status: DC
  Administered 2013-05-30 – 2013-05-31 (×2): 17 g via ORAL

## 2013-05-30 MED ORDER — SODIUM CHLORIDE 0.9 % IR SOLN
Status: DC | PRN
  Administered 2013-05-30: 1000 mL

## 2013-05-30 MED ORDER — BUPIVACAINE IN DEXTROSE 0.75-8.25 % IT SOLN
INTRATHECAL | Status: DC | PRN
  Administered 2013-05-30: 2 mL via INTRATHECAL

## 2013-05-30 MED ORDER — CHLORHEXIDINE GLUCONATE 4 % EX LIQD: 60.0000 mL | Freq: Once | CUTANEOUS | Status: DC

## 2013-05-30 MED ORDER — BUPIVACAINE-EPINEPHRINE PF 0.25-1:200000 % IJ SOLN
INTRAMUSCULAR | Status: AC
  Filled 2013-05-30: qty 30

## 2013-05-30 MED ORDER — LACTATED RINGERS IV SOLN: INTRAVENOUS | Status: DC

## 2013-05-30 MED ORDER — METOCLOPRAMIDE HCL 10 MG PO TABS: 5.0000 mg | ORAL_TABLET | Freq: Three times a day (TID) | ORAL | Status: DC | PRN

## 2013-05-30 MED ORDER — FERROUS SULFATE 325 (65 FE) MG PO TABS
325.0000 mg | ORAL_TABLET | Freq: Three times a day (TID) | ORAL | Status: DC
  Administered 2013-05-31 (×2): 325 mg via ORAL
  Filled 2013-05-30 (×4): qty 1

## 2013-05-30 MED ORDER — METHOCARBAMOL 500 MG PO TABS
500.0000 mg | ORAL_TABLET | Freq: Four times a day (QID) | ORAL | Status: DC | PRN
Start: 2013-05-30 — End: 2013-05-31
  Administered 2013-05-31: 500 mg via ORAL
  Filled 2013-05-30: qty 1

## 2013-05-30 MED ORDER — ONDANSETRON HCL 4 MG/2ML IJ SOLN: 4.0000 mg | Freq: Four times a day (QID) | INTRAMUSCULAR | Status: DC | PRN

## 2013-05-30 MED ORDER — 0.9 % SODIUM CHLORIDE (POUR BTL) OPTIME
TOPICAL | Status: DC | PRN
  Administered 2013-05-30: 1000 mL

## 2013-05-30 MED ORDER — FENTANYL CITRATE 0.05 MG/ML IJ SOLN
INTRAMUSCULAR | Status: DC | PRN
  Administered 2013-05-30 (×2): 50 ug via INTRAVENOUS

## 2013-05-30 MED ORDER — SODIUM CHLORIDE 0.9 % IJ SOLN
INTRAMUSCULAR | Status: DC | PRN
  Administered 2013-05-30: 14 mL via INTRAVENOUS

## 2013-05-30 MED ORDER — PROPOFOL 10 MG/ML IV BOLUS
INTRAVENOUS | Status: DC | PRN
  Administered 2013-05-30: 20 mg via INTRAVENOUS

## 2013-05-30 MED ORDER — CELECOXIB 200 MG PO CAPS
200.0000 mg | ORAL_CAPSULE | Freq: Two times a day (BID) | ORAL | Status: DC
  Administered 2013-05-30 – 2013-05-31 (×2): 200 mg via ORAL
  Filled 2013-05-30 (×3): qty 1

## 2013-05-30 MED ORDER — LIDOCAINE HCL 1 % IJ SOLN
INTRAMUSCULAR | Status: AC
  Filled 2013-05-30: qty 20

## 2013-05-30 MED ORDER — KETOROLAC TROMETHAMINE 30 MG/ML IJ SOLN
INTRAMUSCULAR | Status: AC
  Filled 2013-05-30: qty 1

## 2013-05-30 MED ORDER — DEXAMETHASONE SODIUM PHOSPHATE 10 MG/ML IJ SOLN
10.0000 mg | Freq: Once | INTRAMUSCULAR | Status: DC
Start: 2013-05-31 — End: 2013-05-31
  Filled 2013-05-30: qty 1

## 2013-05-30 MED ORDER — AMIODARONE HCL 100 MG PO TABS
100.0000 mg | ORAL_TABLET | Freq: Every morning | ORAL | Status: DC
  Administered 2013-05-31: 100 mg via ORAL
  Filled 2013-05-30: qty 1

## 2013-05-30 MED ORDER — STERILE WATER FOR IRRIGATION IR SOLN
Status: DC | PRN
  Administered 2013-05-30: 3000 mL

## 2013-05-30 MED ORDER — HYDROMORPHONE HCL PF 1 MG/ML IJ SOLN: 0.2500 mg | INTRAMUSCULAR | Status: DC | PRN

## 2013-05-30 MED ORDER — TAMSULOSIN HCL 0.4 MG PO CAPS
0.4000 mg | ORAL_CAPSULE | Freq: Every morning | ORAL | Status: DC
Start: 2013-05-31 — End: 2013-05-31
  Administered 2013-05-31: 0.4 mg via ORAL
  Filled 2013-05-30: qty 1

## 2013-05-30 MED ORDER — BISACODYL 10 MG RE SUPP: 10.0000 mg | Freq: Every day | RECTAL | Status: DC | PRN

## 2013-05-30 MED ORDER — PHENYLEPHRINE 40 MCG/ML (10ML) SYRINGE FOR IV PUSH (FOR BLOOD PRESSURE SUPPORT)
PREFILLED_SYRINGE | INTRAVENOUS | Status: AC
  Filled 2013-05-30: qty 10

## 2013-05-30 MED ORDER — HYDROCODONE-ACETAMINOPHEN 7.5-325 MG PO TABS
1.0000 | ORAL_TABLET | ORAL | Status: DC
  Administered 2013-05-30 – 2013-05-31 (×5): 2 via ORAL
  Filled 2013-05-30 (×5): qty 2

## 2013-05-30 MED ORDER — MIDAZOLAM HCL 2 MG/2ML IJ SOLN
INTRAMUSCULAR | Status: AC
  Filled 2013-05-30: qty 2

## 2013-05-30 MED ORDER — MIDAZOLAM HCL 5 MG/5ML IJ SOLN
INTRAMUSCULAR | Status: DC | PRN
  Administered 2013-05-30: 2 mg via INTRAVENOUS

## 2013-05-30 MED ORDER — SODIUM CHLORIDE 0.9 % IV SOLN
INTRAVENOUS | Status: DC
  Administered 2013-05-30: 22:00:00 via INTRAVENOUS
  Filled 2013-05-30 (×3): qty 1000

## 2013-05-30 MED ORDER — FLEET ENEMA 7-19 GM/118ML RE ENEM: 1.0000 | ENEMA | Freq: Once | RECTAL | Status: AC | PRN

## 2013-05-30 MED ORDER — PHENYLEPHRINE HCL 10 MG/ML IJ SOLN
INTRAMUSCULAR | Status: DC | PRN
  Administered 2013-05-30 (×2): 80 ug via INTRAVENOUS

## 2013-05-30 MED ORDER — DEXAMETHASONE SODIUM PHOSPHATE 10 MG/ML IJ SOLN
10.0000 mg | Freq: Once | INTRAMUSCULAR | Status: AC
  Administered 2013-05-30: 10 mg via INTRAVENOUS

## 2013-05-30 MED ORDER — DIPHENHYDRAMINE HCL 25 MG PO CAPS: 25.0000 mg | ORAL_CAPSULE | Freq: Four times a day (QID) | ORAL | Status: DC | PRN

## 2013-05-30 MED ORDER — LACTATED RINGERS IV SOLN
INTRAVENOUS | Status: DC
  Administered 2013-05-30: 19:00:00 via INTRAVENOUS
  Administered 2013-05-30: 1000 mL via INTRAVENOUS

## 2013-05-30 MED ORDER — PHENOL 1.4 % MT LIQD: 1.0000 | OROMUCOSAL | Status: DC | PRN

## 2013-05-30 MED ORDER — KETOROLAC TROMETHAMINE 30 MG/ML IJ SOLN
INTRAMUSCULAR | Status: DC | PRN
  Administered 2013-05-30: 30 mg via INTRAVENOUS

## 2013-05-30 MED ORDER — FENTANYL CITRATE 0.05 MG/ML IJ SOLN
INTRAMUSCULAR | Status: AC
  Filled 2013-05-30: qty 2

## 2013-05-30 MED ORDER — ALUM & MAG HYDROXIDE-SIMETH 200-200-20 MG/5ML PO SUSP: 30.0000 mL | ORAL | Status: DC | PRN

## 2013-05-30 MED ORDER — LEVOTHYROXINE SODIUM 150 MCG PO TABS
150.0000 ug | ORAL_TABLET | Freq: Every day | ORAL | Status: DC
  Administered 2013-05-31: 150 ug via ORAL
  Filled 2013-05-30 (×2): qty 1

## 2013-05-30 MED ORDER — METOCLOPRAMIDE HCL 5 MG/ML IJ SOLN: 5.0000 mg | Freq: Three times a day (TID) | INTRAMUSCULAR | Status: DC | PRN

## 2013-05-30 MED ORDER — BUPIVACAINE-EPINEPHRINE (PF) 0.25% -1:200000 IJ SOLN
INTRAMUSCULAR | Status: DC | PRN
  Administered 2013-05-30: 25 mL

## 2013-05-30 MED ORDER — ONDANSETRON HCL 4 MG PO TABS: 4.0000 mg | ORAL_TABLET | Freq: Four times a day (QID) | ORAL | Status: DC | PRN

## 2013-05-30 MED ORDER — DEXAMETHASONE SODIUM PHOSPHATE 10 MG/ML IJ SOLN
INTRAMUSCULAR | Status: AC
  Filled 2013-05-30: qty 1

## 2013-05-30 SURGICAL SUPPLY — 59 items
ADH SKN CLS APL DERMABOND .7 (GAUZE/BANDAGES/DRESSINGS) ×1
BAG SPEC THK2 15X12 ZIP CLS (MISCELLANEOUS) ×1
BAG ZIPLOCK 12X15 (MISCELLANEOUS) ×2 IMPLANT
BANDAGE ELASTIC 6 VELCRO ST LF (GAUZE/BANDAGES/DRESSINGS) ×2 IMPLANT
BANDAGE ESMARK 6X9 LF (GAUZE/BANDAGES/DRESSINGS) ×1 IMPLANT
BLADE SAW SGTL 13.0X1.19X90.0M (BLADE) ×2 IMPLANT
BNDG CMPR 9X6 STRL LF SNTH (GAUZE/BANDAGES/DRESSINGS) ×1
BNDG ESMARK 6X9 LF (GAUZE/BANDAGES/DRESSINGS) ×2
BOWL SMART MIX CTS (DISPOSABLE) ×2 IMPLANT
CAPT RP KNEE ×1 IMPLANT
CEMENT HV SMART SET (Cement) ×2 IMPLANT
CUFF TOURN SGL QUICK 34 (TOURNIQUET CUFF) ×2
CUFF TRNQT CYL 34X4X40X1 (TOURNIQUET CUFF) ×1 IMPLANT
DECANTER SPIKE VIAL GLASS SM (MISCELLANEOUS) ×2 IMPLANT
DERMABOND ADVANCED (GAUZE/BANDAGES/DRESSINGS) ×1
DERMABOND ADVANCED .7 DNX12 (GAUZE/BANDAGES/DRESSINGS) ×1 IMPLANT
DRAPE EXTREMITY T 121X128X90 (DRAPE) ×2 IMPLANT
DRAPE POUCH INSTRU U-SHP 10X18 (DRAPES) ×2 IMPLANT
DRAPE U-SHAPE 47X51 STRL (DRAPES) ×2 IMPLANT
DRSG AQUACEL AG ADV 3.5X 6 (GAUZE/BANDAGES/DRESSINGS) ×1 IMPLANT
DRSG AQUACEL AG ADV 3.5X10 (GAUZE/BANDAGES/DRESSINGS) ×2 IMPLANT
DRSG TEGADERM 4X4.75 (GAUZE/BANDAGES/DRESSINGS) IMPLANT
DURAPREP 26ML APPLICATOR (WOUND CARE) ×4 IMPLANT
ELECT REM PT RETURN 9FT ADLT (ELECTROSURGICAL) ×2
ELECTRODE REM PT RTRN 9FT ADLT (ELECTROSURGICAL) ×1 IMPLANT
EVACUATOR 1/8 PVC DRAIN (DRAIN) IMPLANT
FACESHIELD LNG OPTICON STERILE (SAFETY) ×11 IMPLANT
GAUZE SPONGE 2X2 8PLY STRL LF (GAUZE/BANDAGES/DRESSINGS) IMPLANT
GLOVE BIOGEL PI IND STRL 7.5 (GLOVE) ×1 IMPLANT
GLOVE BIOGEL PI IND STRL 8 (GLOVE) ×1 IMPLANT
GLOVE BIOGEL PI INDICATOR 7.5 (GLOVE) ×1
GLOVE BIOGEL PI INDICATOR 8 (GLOVE) ×1
GLOVE ECLIPSE 8.0 STRL XLNG CF (GLOVE) ×2 IMPLANT
GLOVE ORTHO TXT STRL SZ7.5 (GLOVE) ×4 IMPLANT
GOWN SPEC L3 XXLG W/TWL (GOWN DISPOSABLE) ×2 IMPLANT
GOWN STRL REUS W/TWL LRG LVL3 (GOWN DISPOSABLE) ×2 IMPLANT
HANDPIECE INTERPULSE COAX TIP (DISPOSABLE) ×2
KIT BASIN OR (CUSTOM PROCEDURE TRAY) ×2 IMPLANT
MANIFOLD NEPTUNE II (INSTRUMENTS) ×2 IMPLANT
NDL SAFETY ECLIPSE 18X1.5 (NEEDLE) ×1 IMPLANT
NEEDLE HYPO 18GX1.5 SHARP (NEEDLE) ×2
NS IRRIG 1000ML POUR BTL (IV SOLUTION) ×2 IMPLANT
PACK TOTAL JOINT (CUSTOM PROCEDURE TRAY) ×2 IMPLANT
POSITIONER SURGICAL ARM (MISCELLANEOUS) ×2 IMPLANT
SET HNDPC FAN SPRY TIP SCT (DISPOSABLE) ×1 IMPLANT
SET PAD KNEE POSITIONER (MISCELLANEOUS) ×2 IMPLANT
SPONGE GAUZE 2X2 STER 10/PKG (GAUZE/BANDAGES/DRESSINGS)
SUCTION FRAZIER 12FR DISP (SUCTIONS) ×2 IMPLANT
SUT MNCRL AB 4-0 PS2 18 (SUTURE) ×2 IMPLANT
SUT VIC AB 1 CT1 36 (SUTURE) ×2 IMPLANT
SUT VIC AB 2-0 CT1 27 (SUTURE) ×6
SUT VIC AB 2-0 CT1 TAPERPNT 27 (SUTURE) ×3 IMPLANT
SUT VLOC 180 0 24IN GS25 (SUTURE) ×2 IMPLANT
SYR 50ML LL SCALE MARK (SYRINGE) ×2 IMPLANT
TOWEL OR 17X26 10 PK STRL BLUE (TOWEL DISPOSABLE) ×2 IMPLANT
TOWEL OR NON WOVEN STRL DISP B (DISPOSABLE) IMPLANT
TRAY FOLEY CATH 14FRSI W/METER (CATHETERS) ×2 IMPLANT
WATER STERILE IRR 1500ML POUR (IV SOLUTION) ×2 IMPLANT
WRAP KNEE MAXI GEL POST OP (GAUZE/BANDAGES/DRESSINGS) ×2 IMPLANT

## 2013-05-30 NOTE — Interval H&P Note (Signed)
History and Physical Interval Note:  05/30/2013 4:27 PM  Wayne Ortiz  has presented today for surgery, with the diagnosis of right knee osteoarthritis  The various methods of treatment have been discussed with the patient and family. After consideration of risks, benefits and other options for treatment, the patient has consented to  Procedure(s): RIGHT TOTAL KNEE ARTHROPLASTY (Right) as a surgical intervention .  The patient's history has been reviewed, patient examined, no change in status, stable for surgery.  I have reviewed the patient's chart and labs.  Questions were answered to the patient's satisfaction.     [name]

## 2013-05-30 NOTE — Plan of Care (Signed)
Problem: Consults Goal: Diagnosis- Total Joint Replacement Outcome: Completed/Met Date Met:  05/30/13 Primary Total Knee RIGHT

## 2013-05-30 NOTE — Transfer of Care (Signed)
Immediate Anesthesia Transfer of Care Note  Patient: Wayne Ortiz  Procedure(s) Performed: Procedure(s) (LRB): RIGHT TOTAL KNEE ARTHROPLASTY (Right)  Patient Location: PACU  Anesthesia Type: Spinal  Level of Consciousness: sedated, patient cooperative and responds to stimulation  Airway & Oxygen Therapy: Patient Spontanous Breathing and Patient connected to face mask oxgen  Post-op Assessment: Report given to PACU RN and Post -op Vital signs reviewed and stable  Post vital signs: Reviewed and stable  Complications: No apparent anesthesia complications

## 2013-05-30 NOTE — Preoperative (Signed)
Beta Blockers   Reason not to administer Beta Blockers:Not Applicable 

## 2013-05-30 NOTE — Anesthesia Postprocedure Evaluation (Signed)
  Anesthesia Post-op Note  Patient: Wayne Ortiz  Procedure(s) Performed: Procedure(s) (LRB): RIGHT TOTAL KNEE ARTHROPLASTY (Right)  Patient Location: PACU  Anesthesia Type: Spinal  Level of Consciousness: awake and alert   Airway and Oxygen Therapy: Patient Spontanous Breathing  Post-op Pain: mild  Post-op Assessment: Post-op Vital signs reviewed, Patient's Cardiovascular Status Stable, Respiratory Function Stable, Patent Airway and No signs of Nausea or vomiting  Last Vitals:  Filed Vitals:   05/30/13 2033  BP: 111/72  Pulse: 56  Temp: 36.4 C  Resp: 16    Post-op Vital Signs: stable   Complications: No apparent anesthesia complications

## 2013-05-30 NOTE — Anesthesia Procedure Notes (Signed)
Spinal  Patient location during procedure: OR Start time: 05/30/2013 5:21 PM End time: 05/30/2013 5:27 PM Staffing CRNA/Resident: Lajuana Carry E Performed by: resident/CRNA  Preanesthetic Checklist Completed: patient identified, site marked, surgical consent, pre-op evaluation, timeout performed, IV checked, risks and benefits discussed and monitors and equipment checked Spinal Block Patient position: sitting Prep: Betadine Patient monitoring: heart rate, continuous pulse ox and blood pressure Approach: midline Location: L3-4 Injection technique: single-shot Needle Needle type: Spinocan  Needle gauge: 22 G Needle length: 9 cm Assessment Sensory level: T8 Additional Notes Kit expiration 06/2014. Tol well, clear CSF x 2 attempts, neg heme, neg paresthesia. Return to supine with slight rt. Tilt.

## 2013-05-30 NOTE — Anesthesia Preprocedure Evaluation (Addendum)
Anesthesia Evaluation  Patient identified by MRN, date of birth, ID band Patient awake    Reviewed: Allergy & Precautions, H&P , NPO status , Patient's Chart, lab work & pertinent test results  Airway Mallampati: II TM Distance: >3 FB Neck ROM: full    Dental  (+) Edentulous Upper, Edentulous Lower and Dental Advisory Given Just a few teeth left:   Pulmonary neg pulmonary ROS, shortness of breath and with exertion, former smoker,  breath sounds clear to auscultation  Pulmonary exam normal       Cardiovascular hypertension, Pt. on medications + dysrhythmias Atrial Fibrillation Rhythm:regular Rate:Normal  ECG NSR   Neuro/Psych Cervical spine surgery negative neurological ROS  negative psych ROS   GI/Hepatic negative GI ROS, Neg liver ROS, GERD-  Controlled,  Endo/Other  negative endocrine ROS  Renal/GU negative Renal ROS  negative genitourinary   Musculoskeletal   Abdominal (+) + obese,   Peds  Hematology negative hematology ROS (+)   Anesthesia Other Findings   Reproductive/Obstetrics negative OB ROS                          Anesthesia Physical Anesthesia Plan  ASA: III  Anesthesia Plan: Spinal   Post-op Pain Management:    Induction:   Airway Management Planned:   Additional Equipment:   Intra-op Plan:   Post-operative Plan:   Informed Consent: I have reviewed the patients History and Physical, chart, labs and discussed the procedure including the risks, benefits and alternatives for the proposed anesthesia with the patient or authorized representative who has indicated his/her understanding and acceptance.   Dental Advisory Given  Plan Discussed with: CRNA and Surgeon  Anesthesia Plan Comments:         Anesthesia Quick Evaluation

## 2013-05-31 ENCOUNTER — Encounter (HOSPITAL_COMMUNITY): Payer: Self-pay | Admitting: Orthopedic Surgery

## 2013-05-31 LAB — CBC
HCT: 43.3 % (ref 39.0–52.0)
Hemoglobin: 14.6 g/dL (ref 13.0–17.0)
MCH: 32.6 pg (ref 26.0–34.0)
MCHC: 33.7 g/dL (ref 30.0–36.0)
MCV: 96.7 fL (ref 78.0–100.0)
PLATELETS: 131 10*3/uL — AB (ref 150–400)
RBC: 4.48 MIL/uL (ref 4.22–5.81)
RDW: 13.2 % (ref 11.5–15.5)
WBC: 8.6 10*3/uL (ref 4.0–10.5)

## 2013-05-31 LAB — BASIC METABOLIC PANEL
BUN: 19 mg/dL (ref 6–23)
CALCIUM: 8.5 mg/dL (ref 8.4–10.5)
CO2: 23 mEq/L (ref 19–32)
Chloride: 100 mEq/L (ref 96–112)
Creatinine, Ser: 1.23 mg/dL (ref 0.50–1.35)
GFR calc Af Amer: 68 mL/min — ABNORMAL LOW (ref >90)
GFR calc non Af Amer: 59 mL/min — ABNORMAL LOW (ref >90)
Glucose, Bld: 160 mg/dL — ABNORMAL HIGH (ref 70–99)
Potassium: 4.4 mEq/L (ref 3.7–5.3)
Sodium: 138 mEq/L (ref 137–147)

## 2013-05-31 MED ORDER — POLYETHYLENE GLYCOL 3350 17 G PO PACK: 17.0000 g | PACK | Freq: Two times a day (BID) | ORAL | Status: DC

## 2013-05-31 MED ORDER — FERROUS SULFATE 325 (65 FE) MG PO TABS
325.0000 mg | ORAL_TABLET | Freq: Three times a day (TID) | ORAL | Status: DC
Start: 2013-05-31 — End: 2014-08-04

## 2013-05-31 MED ORDER — ASPIRIN 325 MG PO TBEC: 325.0000 mg | DELAYED_RELEASE_TABLET | Freq: Two times a day (BID) | ORAL | Status: AC

## 2013-05-31 MED ORDER — DSS 100 MG PO CAPS: 100.0000 mg | ORAL_CAPSULE | Freq: Two times a day (BID) | ORAL | Status: DC

## 2013-05-31 MED ORDER — TIZANIDINE HCL 4 MG PO TABS: 4.0000 mg | ORAL_TABLET | Freq: Four times a day (QID) | ORAL | Status: DC | PRN

## 2013-05-31 MED ORDER — HYDROCODONE-ACETAMINOPHEN 7.5-325 MG PO TABS: 1.0000 | ORAL_TABLET | ORAL | Status: DC | PRN

## 2013-05-31 NOTE — Progress Notes (Signed)
Physical Therapy Treatment Note   05/31/13 1400  PT Visit Information  Last PT Received On 05/31/13  Assistance Needed +1  History of Present Illness Pt is s/p R TKA  PT Time Calculation  PT Start Time 1400  PT Stop Time 1411  PT Time Calculation (min) 11 min  Subjective Data  Subjective Pt ambulated again in hallway and practiced steps.  Pt also given HEP.  All questions and concerns answered and pt ready to d/c home.  Precautions  Precautions Knee  Restrictions  Other Position/Activity Restrictions WBAT  Cognition  Arousal/Alertness Awake/alert  Behavior During Therapy WFL for tasks assessed/performed  Overall Cognitive Status Within Functional Limits for tasks assessed  Transfers  Overall transfer level Needs assistance  Equipment used Rolling walker (2 wheeled)  Transfers Sit to/from Stand  Sit to Stand Supervision  General transfer comment pt performed safely  Ambulation/Gait  Ambulation/Gait assistance Supervision  Ambulation Distance (Feet) 120 Feet  Assistive device Rolling walker (2 wheeled)  Gait Pattern/deviations Step-through pattern;Antalgic  Stairs Yes  Stairs assistance Supervision  Stair Management Two rails;Forwards;Step to pattern  Number of Stairs 2  General stair comments verbal cues for sequence and safety, pt reports at home he plans to use one rail and crutch so discussed proper technique with crutch, pt reported understanding of technique with crutch  PT - End of Session  Activity Tolerance Patient tolerated treatment well  Patient left in chair;with call bell/phone within reach;with family/visitor present  PT - Assessment/Plan  PT Plan Current plan remains appropriate  PT Frequency 7X/week  Follow Up Recommendations Home health PT  PT equipment None recommended by PT  PT Goal Progression  Progress towards PT goals Progressing toward goals  PT General Charges  $$ ACUTE PT VISIT 1 Procedure  PT Treatments  $Gait Training 8-22 mins   Carmelia Bake, PT, DPT 05/31/2013 Pager: [phone]

## 2013-05-31 NOTE — Progress Notes (Signed)
[facility]  [facility] is providing the following services: Patient declined rw and commode.  Has equipment at home.   If patient discharges after hours, please call [phone].   Linward Headland 05/31/2013, 11:42 AM

## 2013-05-31 NOTE — Op Note (Signed)
NAME:  Wayne Ortiz                      MEDICAL RECORD NO.:  [id number]               JOSSIAH SMOAK    FACILITY:  [facility]      PHYSICIAN:  Madlyn Frankel. Charlann Boxer, M.D.  DATE OF BIRTH:  Apr 13, 1946      DATE OF PROCEDURE:  05/31/2013                                     OPERATIVE REPORT         PREOPERATIVE DIAGNOSIS:  Right knee osteoarthritis.      POSTOPERATIVE DIAGNOSIS:  Right knee osteoarthritis.      FINDINGS:  The patient was noted to have complete loss of cartilage and   bone-on-bone arthritis with associated osteophytes in the medial and patellofemoral compartments of   the knee with a significant synovitis and associated effusion.      PROCEDURE:  Right total knee replacement.      COMPONENTS USED:  DePuy rotating platform posterior stabilized knee   system, a size 5 femur, 5 tibia, 10 mm PS insert, and 41 patellar   button.      SURGEON:  Madlyn Frankel. Charlann Boxer, M.D.      ASSISTANT:  Lanney Gins, PA-C.      ANESTHESIA:  Spinal.      SPECIMENS:  None.      COMPLICATION:  None.      DRAINS:  One Hemovac.  EBL: <100cc      TOURNIQUET TIME:   Total Tourniquet Time Documented: Thigh (Right) - 37 minutes Total: Thigh (Right) - 37 minutes  .      The patient was stable to the recovery room.      INDICATION FOR PROCEDURE:  [name] XU is a 68 y.o. male patient of   mine.  The patient had been seen, evaluated, and treated conservatively in the   office with medication, activity modification, and injections.  The patient had   radiographic changes of bone-on-bone arthritis with endplate sclerosis and osteophytes noted.      The patient failed conservative measures including medication, injections, and activity modification, and at this point was ready for more definitive measures.   Based on the radiographic changes and failed conservative measures, the patient   decided to proceed with total knee replacement.  Risks of infection,   DVT, component failure, need for revision  surgery, postop course, and   expectations were all   discussed and reviewed.  Consent was obtained for benefit of pain   relief.      PROCEDURE IN DETAIL:  The patient was brought to the operative theater.   Once adequate anesthesia, preoperative antibiotics, 3 gm of Ancef administered, the patient was positioned supine with the right thigh tourniquet placed.  The  right lower extremity was prepped and draped in sterile fashion.  A time-   out was performed identifying the patient, planned procedure, and   extremity.      The right lower extremity was placed in the [facility] leg holder.  The leg was   exsanguinated, tourniquet elevated to 250 mmHg.  A midline incision was   made followed by median parapatellar arthrotomy.  Following initial   exposure, attention was  first directed to the patella.  Precut   measurement was noted to be 26 mm.  I resected down to 15 mm and used a   41 patellar button to restore patellar height as well as cover the cut   surface.      The lug holes were drilled and a metal shim was placed to protect the   patella from retractors and saw blades.      At this point, attention was now directed to the femur.  The femoral   canal was opened with a drill, irrigated to try to prevent fat emboli.  An   intramedullary rod was passed at 5 degrees valgus, 10 mm of bone was   resected off the distal femur.  Following this resection, the tibia was   subluxated anteriorly.  Using the extramedullary guide, 10 mm of bone was resected off   the proximal lateral tibia.  We confirmed the gap would be   stable medially and laterally with a 10 mm insert as well as confirmed   the cut was perpendicular in the coronal plane, checking with an alignment rod.      Once this was done, I sized the femur to be a size 5 in the anterior-   posterior dimension, chose a standard component based on medial and   lateral dimension.  The size 5 rotation block was then pinned in   position  anterior referenced using the C-clamp to set rotation.  The   anterior, posterior, and  chamfer cuts were made without difficulty nor   notching making certain that I was along the anterior cortex to help   with flexion gap stability.      The final box cut was made off the lateral aspect of distal femur.      At this point, the tibia was sized to be a size 5, the size 5 tray was   then pinned in position through the medial third of the tubercle,   drilled, and keel punched.  Trial reduction was now carried with a 5 femur,  5 tibia, a 10 mm insert, and the 41 patella botton.  The knee was brought to   extension, full extension with good flexion stability with the patella   tracking through the trochlea without application of pressure.  Given   all these findings, the trial components removed.  Final components were   opened and cement was mixed.  The knee was irrigated with normal saline   solution and pulse lavage.  The synovial lining was   then injected with 20cc of Exparel, 30cc of 0.25% Marcaine with epinephrine and 1 cc of Toradol,   total of 61 cc.      The knee was irrigated.  Final implants were then cemented onto clean and   dried cut surfaces of bone with the knee brought to extension with a 58mm trial insert.      Once the cement had fully cured, the excess cement was removed   throughout the knee.  I confirmed I was satisfied with the range of   motion and stability, and the final 10 mm PS insert was chosen.  It was   placed into the knee.      The tourniquet had been let down at 37 minutes.  No significant   hemostasis required.  The   extensor mechanism was then reapproximated using #1 Vicryl with the knee   in flexion.  The   remaining wound was  closed with 2-0 Vicryl and running 4-0 Monocryl.   The knee was cleaned, dried, dressed sterilely using Dermabond and   Aquacel dressing.  The patient was then   brought to recovery room in stable condition, tolerating the  procedure   well.   Please note that Physician Assistant, Danae Orleans, PA-C, was present for the entirety of the case, and was utilized for pre-operative positioning, peri-operative retractor management, general facilitation of the procedure.  He was also utilized for primary wound closure at the end of the case.              Pietro Cassis Alvan Dame, M.D.    05/31/2013 9:25 AM

## 2013-05-31 NOTE — Care Management Note (Signed)
    Page 1 of 1   05/31/2013     11:44:05 AM   CARE MANAGEMENT NOTE 05/31/2013  Patient:  Wayne Ortiz, Wayne Ortiz   Account Number:  [id number]  Date Initiated:  05/31/2013  Documentation initiated by:  Sherrin Daisy  Subjective/Objective Assessment:   dx rt knee osteoarthritis; total knee replacemnt     Action/Plan:   CM spoke with patient & spouse. Plans are for patient to return to his home in [place] [facility]) where spouse will be caregiver. He alrwady has RW and commode seat. Wants to use [facility] for [facility] services.   Anticipated DC Date:  05/31/2013   Anticipated DC Plan:  [place]  CM consult      [facility] Choice  HOME HEALTH   Choice offered to / List presented to:  C-1 Patient        [place] arranged  [place] PT      [place].   Status of service:  Completed, signed off Medicare Important Message given?  NA - LOS <3 / Initial given by admissions (If response is "NO", the following Medicare IM given date fields will be blank) Date Medicare IM given:   Date Additional Medicare IM given:    Discharge Disposition:    Per UR Regulation:  Reviewed for med. necessity/level of care/duration of stay  If discussed at [place] of Stay Meetings, dates discussed:    Comments:  05/31/2013 Sherrin Daisy BSN RN CCM [phone] CM spoke with [facility] rep who advised that they woud be able to services patient with start of day after discharge. Anticipate discharge today.

## 2013-05-31 NOTE — Evaluation (Signed)
Physical Therapy Evaluation Patient Details Name: Wayne Ortiz MRN: [id number] DOB: 11-13-1945 Today's Date: 05/31/2013 Time: 1308-6578 PT Time Calculation (min): 24 min  PT Assessment / Plan / Recommendation History of Present Illness  Pt is s/p R TKA  Clinical Impression  Pt is s/p R TKA resulting in the deficits listed below (see PT Problem List).  Pt will benefit from skilled PT to increase their independence and safety with mobility to allow discharge to the venue listed below. Pt mobilizing well today and able to tolerate exercises in recliner as well.    PT Assessment  Patient needs continued PT services    Follow Up Recommendations  Home health PT    Does the patient have the potential to tolerate intense rehabilitation      Barriers to Discharge        Equipment Recommendations  None recommended by PT    Recommendations for Other Services     Frequency 7X/week    Precautions / Restrictions Precautions Precautions: Knee Restrictions Weight Bearing Restrictions: No Other Position/Activity Restrictions: WBAT   Pertinent Vitals/Pain Pt c/o anterior knee pain over incision site during ambulation and mobility, premedicated, repositioned      Mobility  Bed Mobility Overal bed mobility: Needs Assistance Bed Mobility: Supine to Sit Supine to sit: Supervision General bed mobility comments: verbal cues for self assist Transfers Overall transfer level: Needs assistance Equipment used: Rolling walker (2 wheeled) Transfers: Sit to/from Stand Sit to Stand: Min guard General transfer comment: verbal cues for safe technique Ambulation/Gait Ambulation/Gait assistance: Min guard Ambulation Distance (Feet): 140 Feet Assistive device: Rolling walker (2 wheeled) Gait Pattern/deviations: Step-to pattern;Antalgic General Gait Details: verbal cues for sequence, technique, step length, RW distance    Exercises Total Joint Exercises Ankle Circles/Pumps: AROM;Both;15  reps Quad Sets: AROM;Both;15 reps Short Arc Quad: Right;15 reps;AROM Heel Slides: AAROM;Right;15 reps;Seated Hip ABduction/ADduction: AROM;Right;15 reps Straight Leg Raises: AROM;Right;10 reps   PT Diagnosis: Difficulty walking;Acute pain  PT Problem List: Decreased strength;Decreased range of motion;Decreased mobility;Pain;Decreased knowledge of use of DME PT Treatment Interventions: Functional mobility training;Gait training;DME instruction;Patient/family education;Stair training;Therapeutic activities;Therapeutic exercise     PT Goals(Current goals can be found in the care plan section) Acute Rehab PT Goals Patient Stated Goal: home PT Goal Formulation: With patient Time For Goal Achievement: 06/07/13 Potential to Achieve Goals: Good  Visit Information  Last PT Received On: 05/31/13 Assistance Needed: +1 History of Present Illness: Pt is s/p R TKA       Prior Functioning  Home Living Family/patient expects to be discharged to:: Private residence Living Arrangements: Spouse/significant other Type of Home: House Home Access: Stairs to enter Technical brewer of Steps: 3-5 Entrance Stairs-Rails: Right Home Layout: One level Home Equipment: Bedside commode;Walker - 2 wheels;Crutches;Cane - single point Additional Comments: pt states he can borrow a tub seat again Prior Function Level of Independence: Independent Communication Communication: No difficulties    Cognition  Cognition Arousal/Alertness: Awake/alert Behavior During Therapy: WFL for tasks assessed/performed Overall Cognitive Status: Within Functional Limits for tasks assessed    Extremity/Trunk Assessment Upper Extremity Assessment Upper Extremity Assessment: Overall WFL for tasks assessed Lower Extremity Assessment Lower Extremity Assessment: RLE deficits/detail RLE Deficits / Details: R knee -5-100* AROM, good quad contraction   Balance    End of Session PT - End of Session Activity Tolerance:  Patient tolerated treatment well Patient left: in chair;with call bell/phone within reach  GP     Wayne Ortiz,KATHrine E 05/31/2013, 1:34 PM Carmelia Bake, PT, DPT 05/31/2013  Pager: [phone]

## 2013-05-31 NOTE — Evaluation (Signed)
Occupational Therapy Evaluation Patient Details Name: Wayne Ortiz MRN: [id number] DOB: [birth year] Today's Date: 05/31/2013 Time: 1020-1049 OT Time Calculation (min): 29 min  OT Assessment / Plan / Recommendation History of present illness Pt is s/p R TKA   Clinical Impression   Pt needs occasional cues for safety as he tends to turn quickly at times. Also cues for hand placement and walker safety. He will benefit from acute OT to improve safety and independence with self care tasks.     OT Assessment  Patient needs continued OT Services    Follow Up Recommendations  No OT follow up;Supervision/Assistance - 24 hour    Barriers to Discharge      Equipment Recommendations  None recommended by OT    Recommendations for Other Services    Frequency  Min 2X/week    Precautions / Restrictions Precautions Precautions: Knee Restrictions Weight Bearing Restrictions: No Other Position/Activity Restrictions: WBAT   Pertinent Vitals/Pain 9/10 briefly with standing 6/10 during most of session No pain once at rest again. Ice applied.    ADL  Eating/Feeding: Simulated;Independent Where Assessed - Eating/Feeding: Chair Grooming: Simulated;Wash/dry hands;Set up Where Assessed - Grooming: Supported sitting Upper Body Bathing: Simulated;Chest;Right arm;Left arm;Abdomen;Set up Where Assessed - Upper Body Bathing: Unsupported sitting Lower Body Bathing: Simulated;Minimal assistance Where Assessed - Lower Body Bathing: Supported sit to stand Upper Body Dressing: Simulated;Set up Where Assessed - Upper Body Dressing: Unsupported sitting Lower Body Dressing: Simulated;Minimal assistance Where Assessed - Lower Body Dressing: Supported sit to stand Toilet Transfer: Performed;Minimal Manufacturing systems engineer: Raised toilet seat with arms (or 3-in-1 over toilet) Toileting - Clothing Manipulation and Hygiene: Simulated;Minimal assistance;Min guard Where Assessed - Toileting  Clothing Manipulation and Hygiene: Sit to stand from 3-in-1 or toilet Tub/Shower Transfer: Simulated;Minimal assistance Equipment Used: Rolling walker ADL Comments: Practiced stepping over trash can as simulated tub and pt needing min assist for safety. Discussed an extra day or two to shower (depending on when allowed to shower by MD) to increase strength and WB tolerance R LE. Also recommended pt obtain tubseat from his friend to sit and shower and also discussed gripper mat for bottom of tub. Pt verbalizes understanding of all. Also recommended pt have assist with shower when he does shower. Wife can assist at home. Needs some reminders for hand placement and moves quickly at times.    OT Diagnosis: Generalized weakness  OT Problem List: Decreased strength;Decreased knowledge of use of DME or AE OT Treatment Interventions: Self-care/ADL training;DME and/or AE instruction;Therapeutic activities;Patient/family education   OT Goals(Current goals can be found in the care plan section) Acute Rehab OT Goals Patient Stated Goal: home OT Goal Formulation: With patient Time For Goal Achievement: 06/07/13 Potential to Achieve Goals: Good  Visit Information  Last OT Received On: 05/31/13 Assistance Needed: +1 History of Present Illness: Pt is s/p R TKA       Prior Functioning     Home Living Family/patient expects to be discharged to:: Private residence Living Arrangements: Spouse/significant other Home Equipment: Bedside commode;Walker - 2 wheels;Crutches;Cane - single point Additional Comments: pt states he can borrow a tub seat again Prior Function Level of Independence: Independent Communication Communication: No difficulties         Vision/Perception     Cognition  Cognition Arousal/Alertness: Awake/alert Behavior During Therapy: WFL for tasks assessed/performed Overall Cognitive Status: Within Functional Limits for tasks assessed    Extremity/Trunk Assessment Upper  Extremity Assessment Upper Extremity Assessment: Overall WFL for tasks assessed  Mobility Transfers Overall transfer level: Needs assistance Equipment used: Rolling walker (2 wheeled) Transfers: Sit to/from Stand Sit to Stand: Min guard General transfer comment: min assist to descend to 3in1 safely and verbal cues hand placement and back up to surface completely.     Exercise     Balance     End of Session OT - End of Session Equipment Utilized During Treatment: Rolling walker Activity Tolerance: Patient tolerated treatment well Patient left: in chair;with call bell/phone within reach  GO     Jules Schick [phone] 05/31/2013, 10:51 AM

## 2013-05-31 NOTE — Progress Notes (Signed)
Pt to d/c home with [facility]. No DME needs. AVS reviewed and "My Chart" discussed with pt. Pt capable of verbalizing medications, dressing changes, signs and symptoms of infection, and follow-up appointments. Remains hemodynamically stable. No signs and symptoms of distress. Educated pt to return to ER in the case of SOB, dizziness, or chest pain.  

## 2013-05-31 NOTE — Progress Notes (Signed)
Patient ID: Wayne Ortiz, male   DOB: 04-15-1946, 67 y.o.   MRN: [id number] Subjective: 1 Day Post-Op Procedure(s) (LRB): RIGHT TOTAL KNEE ARTHROPLASTY (Right)    Patient reports pain as mild.  Relatively comfortable.  Ready for breakfast  Objective:   VITALS:   Filed Vitals:   05/31/13 0630  BP: 130/77  Pulse: 61  Temp: 98.1 F (36.7 C)  Resp: 16    Neurovascular intact Incision: dressing C/D/I  LABS  Recent Labs  05/31/13 0431  HGB 14.6  HCT 43.3  WBC 8.6  PLT 131*     Recent Labs  05/31/13 0431  NA 138  K 4.4  BUN 19  CREATININE 1.23  GLUCOSE 160*    No results found for this basename: LABPT, INR,  in the last 72 hours   Assessment/Plan: 1 Day Post-Op Procedure(s) (LRB): RIGHT TOTAL KNEE ARTHROPLASTY (Right)   Up with therapy Discharge home with home health today after therapy twice today  Reviewed recovery with him, my expectations and hopes for him

## 2013-06-01 NOTE — Discharge Summary (Signed)
Physician Discharge Summary  Patient ID: JANSEL VONSTEIN MRN: [id number] DOB/AGE: [birth year] 68 y.o.  Admit date: 05/30/2013 Discharge date: 05/31/2013   Procedures:  Procedure(s) (LRB): RIGHT TOTAL KNEE ARTHROPLASTY (Right)  Attending Physician:  Dr. Paralee Cancel   Admission Diagnoses:   Right knee OA / pain  Discharge Diagnoses:  Principal Problem:   S/P right TKA  Past Medical History  Diagnosis Date  . Thyroid disease   . Hypertension   . Gout   . Femur fracture   . Atrial fibrillation   . Complication of anesthesia     BECAME COMBATIVE  . Shortness of breath     with exertion  . Arthritis   . History of gout   . Precancerous skin lesion   . GERD (gastroesophageal reflux disease)     occasional TUMS  . History of kidney stones   . BPH (benign prostatic hypertrophy)   . Nocturia     HPI: Dorris Singh, 68 y.o. male, has a history of pain and functional disability in the right knee due to arthritis and has failed non-surgical conservative treatments for greater than 12 weeks to includeNSAID's and/or analgesics, corticosteriod injections and activity modification. Onset of symptoms was gradual, starting 3+ years ago with gradually worsening course since that time. The patient noted prior procedures on the knee to include menisectomy on the right knee(s). Patient currently rates pain in the right knee(s) at 7 out of 10 with activity. Patient has worsening of pain with activity and weight bearing, pain that interferes with activities of daily living, pain with passive range of motion, crepitus and joint swelling. Patient has evidence of periarticular osteophytes and joint space narrowing by imaging studies. There is no active infection. Risks, benefits and expectations were discussed with the patient. Risks including but not limited to the risk of anesthesia, blood clots, nerve damage, blood vessel damage, failure of the prosthesis, infection and up to and including death. Patient  understand the risks, benefits and expectations and wishes to proceed with surgery.   PCP: [name], MD   Discharged Condition: good  Hospital Course:  Patient underwent the above stated procedure on 05/30/2013. Patient tolerated the procedure well and brought to the recovery room in good condition and subsequently to the floor.  POD #1 BP: 130/77 ; Pulse: 61 ; Temp: 98.1 F (36.7 C) ; Resp: 16  Patient reports pain as mild. Relatively comfortable. Ready for breakfast.  Ready to be discharged home.  Neurovascular intact, dorsiflexion/plantar flexion intact, incision: dressing C/D/I, no cellulitis present and compartment soft.   LABS  Basename    HGB  14.6  HCT  43.3    Discharge Exam: General appearance: alert, cooperative and no distress Extremities: Homans sign is negative, no sign of DVT, no edema, redness or tenderness in the calves or thighs and no ulcers, gangrene or trophic changes  Disposition:    Home-Health Care Svc with follow up in 2 weeks   Follow-up Information   Follow up with [name], MD. Schedule an appointment as soon as possible for a visit in 2 weeks.   Specialty:  Orthopedic Surgery   Contact information:   [address] [place] [address] [phone]       Discharge Orders   Future Appointments Provider Department Dept Phone   01/03/2014 10:30 AM [name], MD [place] [phone]   Future Orders Complete By Expires   Call MD / Call 911  As  directed    Comments:     If you experience chest pain or shortness of breath, CALL 911 and be transported to the hospital emergency room.  If you develope a fever above 101 F, pus (white drainage) or increased drainage or redness at the wound, or calf pain, call your surgeon's office.   Change dressing  As directed    Comments:     Maintain surgical dressing for 10-14 days, or until follow up in the clinic.   Constipation Prevention  As directed     Comments:     Drink plenty of fluids.  Prune juice may be helpful.  You may use a stool softener, such as Colace (over the counter) 100 mg twice a day.  Use MiraLax (over the counter) for constipation as needed.   Diet - low sodium heart healthy  As directed    Discharge instructions  As directed    Comments:     Maintain surgical dressing for 10-14 days, or until follow up in the clinic. Follow up in 2 weeks at [facility]. Call with any questions or concerns.   Driving restrictions  As directed    Comments:     No driving for 4 weeks   Increase activity slowly as tolerated  As directed    TED hose  As directed    Comments:     Use stockings (TED hose) for 2 weeks on both leg(s).  You may remove them at night for sleeping.   Weight bearing as tolerated  As directed    Questions:     Laterality:     Extremity:          Medication List    STOP taking these medications       diclofenac 75 MG EC tablet  Commonly known as:  VOLTAREN     traMADol 50 MG tablet  Commonly known as:  ULTRAM      TAKE these medications       allopurinol 300 MG tablet  Commonly known as:  ZYLOPRIM  Take 300 mg by mouth every morning.     amiodarone 100 MG tablet  Commonly known as:  PACERONE  Take 100 mg by mouth every morning.     aspirin 325 MG EC tablet  Take 1 tablet (325 mg total) by mouth 2 (two) times daily.     DSS 100 MG Caps  Take 100 mg by mouth 2 (two) times daily.     ferrous sulfate 325 (65 FE) MG tablet  Take 1 tablet (325 mg total) by mouth 3 (three) times daily after meals.     HYDROcodone-acetaminophen 7.5-325 MG per tablet  Commonly known as:  NORCO  Take 1-2 tablets by mouth every 4 (four) hours as needed for moderate pain.     levothyroxine 137 MCG tablet  Commonly known as:  SYNTHROID, LEVOTHROID  Take 150 mcg by mouth daily before breakfast.     lisinopril 20 MG tablet  Commonly known as:  PRINIVIL,ZESTRIL  Take 20 mg by mouth daily after supper.      polyethylene glycol packet  Commonly known as:  MIRALAX / GLYCOLAX  Take 17 g by mouth 2 (two) times daily.     tamsulosin 0.4 MG Caps capsule  Commonly known as:  FLOMAX  Take 0.4 mg by mouth every morning.     tiZANidine 4 MG tablet  Commonly known as:  ZANAFLEX  Take 1 tablet (4 mg total) by mouth every 6 (six)  hours as needed for muscle spasms.         Signed: [place] Pugh. Argenis Kumari   PAC  06/01/2013, 2:05 PM

## 2013-06-01 NOTE — Progress Notes (Signed)
Discharge summary sent to payer through [facility]  

## 2013-06-21 ENCOUNTER — Ambulatory Visit (HOSPITAL_COMMUNITY)
Admission: RE | Admit: 2013-06-21 | Discharge: 2013-06-21 | Disposition: A | Discharge status: Home / Self Care | Payer: Medicare HMO | Source: Ambulatory Visit | Attending: Orthopedic Surgery | Admitting: Orthopedic Surgery

## 2013-06-21 DIAGNOSIS — M25561 Pain in right knee: Secondary | ICD-10-CM | POA: Insufficient documentation

## 2013-06-21 DIAGNOSIS — M25669 Stiffness of unspecified knee, not elsewhere classified: Secondary | ICD-10-CM | POA: Insufficient documentation

## 2013-06-21 DIAGNOSIS — T8489XA Other specified complication of internal orthopedic prosthetic devices, implants and grafts, initial encounter: Secondary | ICD-10-CM

## 2013-06-21 DIAGNOSIS — R262 Difficulty in walking, not elsewhere classified: Secondary | ICD-10-CM | POA: Insufficient documentation

## 2013-06-21 DIAGNOSIS — Z96659 Presence of unspecified artificial knee joint: Secondary | ICD-10-CM

## 2013-06-21 DIAGNOSIS — IMO0001 Reserved for inherently not codable concepts without codable children: Secondary | ICD-10-CM | POA: Insufficient documentation

## 2013-06-21 DIAGNOSIS — M25569 Pain in unspecified knee: Secondary | ICD-10-CM | POA: Insufficient documentation

## 2013-06-21 NOTE — Evaluation (Signed)
Physical Therapy Evaluation  Patient Details  Name: Wayne Ortiz MRN: [id number] Date of Birth: 04/07/1946  Today's Date: 06/21/2013 Time: 0852-0930 PT Time Calculation (min): 38 min Charge:  Evaluation 855-930             Visit#: 1 of 12  Re-eval: 07/21/13 Assessment Diagnosis: Rt TKR Surgical Date: 05/30/13 Next MD Visit: 07/03/2013 Prior Therapy: HH  Authorization: Bernadene Person    Past Medical History:  Past Medical History  Diagnosis Date  . Thyroid disease   . Hypertension   . Gout   . Femur fracture   . Atrial fibrillation   . Complication of anesthesia     BECAME COMBATIVE  . Shortness of breath     with exertion  . Arthritis   . History of gout   . Precancerous skin lesion   . GERD (gastroesophageal reflux disease)     occasional TUMS  . History of kidney stones   . BPH (benign prostatic hypertrophy)   . Nocturia    Past Surgical History:  Past Surgical History  Procedure Laterality Date  . Cervical spine surgery    . Fracture surgery      left arm  . Femur fracture surgery    . Ercp [name] 01/02/2013    Procedure: ENDOSCOPIC RETROGRADE CHOLANGIOPANCREATOGRAPHY (ERCP);  Surgeon: [name], MD;  Location: [name];  Service: Endoscopy;  Laterality: [name];  . Sphincterotomy [name] 01/02/2013    Procedure: SPHINCTEROTOMY;  Surgeon: [name], MD;  Location: [name];  Service: Endoscopy;  Laterality: [name];  Stone Extraction  . Cholecystectomy [name] 01/03/2013    Procedure: LAPAROSCOPIC CHOLECYSTECTOMY;  Surgeon: [name], MD;  Location: [name];  Service: General;  Laterality: [name];  . Liver biopsy [name] 01/03/2013    Procedure: LIVER BIOPSY;  Surgeon: [name], MD;  Location: [name];  Service: General;  Laterality: [name];  . Cataracts      EXCISION  . Total knee arthroplasty Right 05/30/2013    Procedure: RIGHT TOTAL KNEE ARTHROPLASTY;  Surgeon: [name], MD;  Location: [name];  Service: Orthopedics;  Laterality: Right;     Subjective Symptoms/Limitations Symptoms: Pt states he had a TKR on his Rt knee on 05/30/2013.  Pt had fluid taken off his knee last week.  Pt states that overall he is doing much better. Pertinent History: spinal stenosis; Lt knee OA How long can you sit comfortably?: 10-15 minutes How long can you stand comfortably?: able to stand for 10-15 minutes How long can you walk comfortably?: Pt able to walk with cane for 15 minutes.  Patient Stated Goals: Pt would like to go up and down steps reciprocal; off the cane; able to have a good night sleep; able to walk without pain work in yard. Pain Assessment Currently in Pain?: Yes Pain Score: 4  Pain Location: Knee Pain Orientation: Right Pain Type: Surgical pain    Sensation/Coordination/Flexibility/Functional Tests Functional Tests Functional Tests: FS 68  Assessment RLE AROM (degrees) Right Knee Extension: 23 Right Knee Flexion: 122 RLE Strength Right Hip Flexion: 5/5 Right Hip Extension: 3+/5 Right Hip ABduction: 5/5 Right Hip ADduction: 5/5 Right Knee Flexion: 5/5 Right Knee Extension: 4/5 Right Ankle Dorsiflexion: 5/5 Palpation Palpation: significant edema pre-patella   Exercise/Treatments    Stretches Active Hamstring Stretch: 3 reps;30 seconds Passive hamstring stretch while sitting on edge of table.    Seated Long Arc Quad: 10 reps Supine Quad Sets: 10 reps Other Supine Knee Exercises: ankle on towel  keeping leg straiight x 5' with manual  Manual Therapy Manual Therapy: Other (comment) Other Manual Therapy: decongestive manual techniques used to decrease edema  Physical Therapy Assessment and Plan PT Assessment and Plan Clinical Impression Statement: Mr. Letourneau had a Rt TKR on 05/30/2013 and is currently being referred to OP therapy.  Mr. Cheek main problems at this time is lack of extension as well as edema.  Pt will benefit from skilled PT to address these issues and return Mr. Leabo to his maximal functioning  ability.   Pt will benefit from skilled therapeutic intervention in order to improve on the following deficits: Decreased activity tolerance;Pain;Increased edema;Decreased range of motion;Decreased balance Rehab Potential: Good PT Frequency: Min 3X/week PT Duration: 4 weeks PT Treatment/Interventions: Stair training;Therapeutic activities;Therapeutic exercise;Manual techniques;Modalities PT Plan: Begin PROM, rockerboard, slant board stretch next treatment.      Goals Home Exercise Program Pt/caregiver will Perform Home Exercise Program: For increased ROM;For increased strengthening PT Goal: Perform Home Exercise Program - Progress: Goal set today PT Short Term Goals Time to Complete Short Term Goals: 2 weeks PT Short Term Goal 1: Extension to be 15 to alllow more normalized gt PT Short Term Goal 2: Pt to be able to sit for 30 minutes with comfort to be able to enjoy a meal PT Short Term Goal 3: Pt to be able to stand for 15 minutes to be able to wash dishes without pain PT Short Term Goal 4: Pt to be able to walk without cane for 20 minutes  PT Short Term Goal 5: Pain level to be no greater thana 2/10 80% of the time for better quality of life. PT Long Term Goals Time to Complete Long Term Goals: 4 weeks PT Long Term Goal 1: ROM to be 5 to have normal gait pattern PT Long Term Goal 2: Pt to be able to sit with comfort for an hour for traveling Long Term Goal 3: Pt to be able to stand for 30 minutes without pain to socialize Long Term Goal 4: Pt to be able to walk wihout an assistive device for 45 minutes to be able to complete shopping  PT Long Term Goal 5: Pt to be able to go up and down steps reciprocally  Problem List Patient Active Problem List   Diagnosis Date Noted  . Postoperative stiffness of total knee replacement 06/21/2013  . Pain in right knee 06/21/2013  . Difficulty in walking(719.7) 06/21/2013  . S/P right TKA 05/30/2013  . Abdominal pain, right upper quadrant  12/31/2012  . Abnormal transaminases 12/31/2012  . Thrombocytopenia, unspecified 12/31/2012    PT Plan of Care PT Home Exercise Plan: given  GP Functional Assessment Tool Used: foto Functional Limitation: Mobility: Walking and moving around Mobility: Walking and Moving Around Current Status (Y8657): At least 20 percent but less than 40 percent impaired, limited or restricted Mobility: Walking and Moving Around Goal Status [id number]): At least 1 percent but less than 20 percent impaired, limited or restricted  Everitt Wenner,CINDY 06/21/2013, 2:25 PM  Physician Documentation Your signature is required to indicate approval of the treatment plan as stated above.  Please sign and either send electronically or make a copy of this report for your files and return this physician signed original.   Please mark one 1.__approve of plan  2. ___approve of plan with the following conditions.   ______________________________  _____________________ Physician Signature                                                                                                             Date

## 2013-06-24 ENCOUNTER — Ambulatory Visit (HOSPITAL_COMMUNITY)
Admission: RE | Admit: 2013-06-24 | Discharge: 2013-06-24 | Disposition: A | Discharge status: Home / Self Care | Payer: Medicare HMO | Source: Ambulatory Visit | Attending: Orthopedic Surgery | Admitting: Orthopedic Surgery

## 2013-06-24 DIAGNOSIS — M25561 Pain in right knee: Secondary | ICD-10-CM

## 2013-06-24 DIAGNOSIS — M25669 Stiffness of unspecified knee, not elsewhere classified: Secondary | ICD-10-CM

## 2013-06-24 DIAGNOSIS — T8489XA Other specified complication of internal orthopedic prosthetic devices, implants and grafts, initial encounter: Principal | ICD-10-CM

## 2013-06-24 DIAGNOSIS — Z96659 Presence of unspecified artificial knee joint: Principal | ICD-10-CM

## 2013-06-24 DIAGNOSIS — R262 Difficulty in walking, not elsewhere classified: Secondary | ICD-10-CM

## 2013-06-24 NOTE — Progress Notes (Signed)
Physical Therapy Treatment Patient Details  Name: Wayne Ortiz MRN: [id number] Date of Birth: Aug 29, 1945  Today's Date: 06/24/2013 Time: 7672-0947 PT Time Calculation (min): 43 min Charge: Gait 0962-8366, TE 2947-6546  Visit#: 2 of 12  Re-eval: 07/21/13 Assessment Diagnosis: Rt TKR Surgical Date: 05/30/13 Next MD Visit: Alvan Dame 07/03/2013 Prior Therapy: HH  Authorization: [employer]are  Authorization Time Period:    Authorization Visit#:   of     Subjective: Symptoms/Limitations Symptoms: Pt stated he slipped earlier today, caught himself with Lt elbow.   Pain Assessment Currently in Pain?: Yes Pain Score: 4  Pain Location: Knee Pain Orientation: Right  Objective:   Exercise/Treatments Stretches Active Hamstring Stretch: 3 reps;30 seconds Gastroc Stretch: Limitations;3 reps;30 seconds Gastroc Stretch Limitations: slant board Standing Knee Flexion: Right;15 reps Terminal Knee Extension: Right;10 reps;Theraband Theraband Level (Terminal Knee Extension): Level 4 (Blue) Rocker Board: 2 minutes;Limitations Rocker Board Limitations: R/L intermittent HHA Gait Training: Gait training for heel to toe pattern to improve gait mechanics Seated Long Arc Quad: 10 reps Supine Quad Sets: 10 reps;Limitations Quad Sets Limitations: 10" holds Short Arc [employer] Sets: Right;10 reps;Limitations Technical brewer Limitations: 10" holds Knee Extension: PROM;Right;3 sets Other Supine Knee Exercises: ankle on towel keeping leg straiight x 5' with manual Prone  Prone Knee Hang: 5 minutes;Limitations Prone Knee Hang Limitations: with manual MFR hamstring region      Physical Therapy Assessment and Plan PT Assessment and Plan Clinical Impression Statement: Began session with gait training with cueing for heel to toe pattern, posture and equal stride length with improved gait mechanics noted following cueing.  Session focus on improving quad strengthening, stretches for flexibilty and  manual techniques to improve knee extension. PT Plan: Continue with POC to imporve knee extension and improve flexibility.      Goals Home Exercise Program Pt/caregiver will Perform Home Exercise Program: For increased ROM;For increased strengthening PT Short Term Goals Time to Complete Short Term Goals: 2 weeks PT Short Term Goal 1: Extension to be 15 to alllow more normalized gt PT Short Term Goal 1 - Progress: Progressing toward goal PT Short Term Goal 2: Pt to be able to sit for 30 minutes with comfort to be able to enjoy a meal PT Short Term Goal 2 - Progress: Progressing toward goal PT Short Term Goal 3: Pt to be able to stand for 15 minutes to be able to wash dishes without pain PT Short Term Goal 3 - Progress: Progressing toward goal PT Short Term Goal 4: Pt to be able to walk without cane for 20 minutes  PT Short Term Goal 4 - Progress: Progressing toward goal PT Short Term Goal 5: Pain level to be no greater thana 2/10 80% of the time for better quality of life. PT Short Term Goal 5 - Progress: Progressing toward goal PT Long Term Goals Time to Complete Long Term Goals: 4 weeks PT Long Term Goal 1: ROM to be 5 to have normal gait pattern PT Long Term Goal 2: Pt to be able to sit with comfort for an hour for traveling Long Term Goal 3: Pt to be able to stand for 30 minutes without pain to socialize Long Term Goal 4: Pt to be able to walk wihout an assistive device for 45 minutes to be able to complete shopping  PT Long Term Goal 5: Pt to be able to go up and down steps reciprocally  Problem List Patient Active Problem List   Diagnosis Date Noted  .  Postoperative stiffness of total knee replacement 06/21/2013  . Pain in right knee 06/21/2013  . Difficulty in walking(719.7) 06/21/2013  . S/P right TKA 05/30/2013  . Abdominal pain, right upper quadrant 12/31/2012  . Abnormal transaminases 12/31/2012  . Thrombocytopenia, unspecified 12/31/2012    PT - End of  Session Activity Tolerance: Patient tolerated treatment well General Behavior During Therapy: [facility] for tasks assessed/performed  GP    Aldona Lento 06/24/2013, 6:37 PM

## 2013-06-27 ENCOUNTER — Ambulatory Visit (HOSPITAL_COMMUNITY)
Admission: RE | Admit: 2013-06-27 | Discharge: 2013-06-27 | Disposition: A | Discharge status: Home / Self Care | Payer: Medicare HMO | Source: Ambulatory Visit | Attending: Family Medicine | Admitting: Family Medicine

## 2013-06-27 DIAGNOSIS — M25569 Pain in unspecified knee: Secondary | ICD-10-CM | POA: Insufficient documentation

## 2013-06-27 DIAGNOSIS — IMO0001 Reserved for inherently not codable concepts without codable children: Secondary | ICD-10-CM | POA: Insufficient documentation

## 2013-06-27 DIAGNOSIS — R262 Difficulty in walking, not elsewhere classified: Secondary | ICD-10-CM | POA: Insufficient documentation

## 2013-06-27 DIAGNOSIS — M25669 Stiffness of unspecified knee, not elsewhere classified: Secondary | ICD-10-CM | POA: Insufficient documentation

## 2013-06-27 NOTE — Progress Notes (Signed)
Physical Therapy Treatment Patient Details  Name: Wayne Ortiz MRN: [id number] Date of Birth: May 13, 1945  Today's Date: 06/27/2013 Time: 1015-1100 PT Time Calculation (min): 45 min Visit#: 3 of 12  Re-eval: 07/21/13 Authorization: [employer]are  Charges:  Manual 1020-144 (24'), ice 1045-1100 (15')  Subjective: Symptoms/Limitations Symptoms: Pt states his sciatic nerve has been bothering him more since he fell and he has popping into Rt distal quad with knee flexion.  State it has been swelling more as well.  Currently with 2/10 pain. Pain Assessment Currently in Pain?: Yes Pain Score: 2  Pain Location: Knee Pain Orientation: Right   Objective: Modalities Modalities: Cryotherapy Manual Therapy Manual Therapy: Other (comment) Other Manual Therapy: Retro massage to Rt knee, Myofascial and soft tissue work to perimeter of knee and distal quad to  Cryotherapy Number Minutes Cryotherapy: 15 Minutes Cryotherapy Location: Knee Type of Cryotherapy: Ice pack  Physical Therapy Assessment and Plan PT Assessment:  Encouraged pt to contact MD regarding recent fall and popping into knee with movement; Pt verbalized understanding.   Focused session on manual techniques to decrease poppling and associated pain perimeter of knee.  Instructed to continue gentle ROM and strengthening exercises.  Overall improvement with pain reduction at end of session, however continues to pop with flexion.  Warm, red pocket inferior knee noted but without symptoms of active infection. PT Plan: follow up with manual techniques next visit.  Resume therex depending on symptoms/improvement of Rt knee.      Problem List Patient Active Problem List   Diagnosis Date Noted  . Postoperative stiffness of total knee replacement 06/21/2013  . Pain in right knee 06/21/2013  . Difficulty in walking(719.7) 06/21/2013  . S/P right TKA 05/30/2013  . Abdominal pain, right upper quadrant 12/31/2012  . Abnormal transaminases  12/31/2012  . Thrombocytopenia, unspecified 12/31/2012    PT - End of Session Activity Tolerance: Patient tolerated treatment well General Behavior During Therapy: WFL for tasks assessed/performed   Teena Irani, PTA/CLT 06/27/2013, 12:42 PM

## 2013-06-29 ENCOUNTER — Ambulatory Visit (HOSPITAL_COMMUNITY): Payer: Medicare HMO | Admitting: *Deleted

## 2013-07-01 ENCOUNTER — Ambulatory Visit (HOSPITAL_COMMUNITY): Payer: Medicare HMO | Admitting: Physical Therapy

## 2013-07-04 ENCOUNTER — Ambulatory Visit (INDEPENDENT_AMBULATORY_CARE_PROVIDER_SITE_OTHER): Payer: Medicare Other | Admitting: Internal Medicine

## 2013-07-04 ENCOUNTER — Ambulatory Visit (HOSPITAL_COMMUNITY)
Admission: RE | Admit: 2013-07-04 | Discharge: 2013-07-04 | Disposition: A | Discharge status: Home / Self Care | Payer: Medicare HMO | Source: Ambulatory Visit | Attending: Family Medicine | Admitting: Family Medicine

## 2013-07-04 NOTE — Progress Notes (Signed)
Physical Therapy Treatment Patient Details  Name: Wayne Ortiz MRN: [id number] Date of Birth: 02-04-1946  Today's Date: 07/04/2013 Time: 1104-1200 PT Time Calculation (min): 56 min  Visit#: 3 of 12  Re-eval: 07/21/13 Diagnosis: Rt TKR Surgical Date: 05/30/13 Next MD Visit: Alvan Dame 07/01/2013 Prior Therapy: HH Authorization: [employer]  Charges:  therex 1104-1130 (26'), manual 1132-1146 (14'), icepack 1150-1200  Subjective: Symptoms/Limitations Symptoms: Pt states he went back to MD on Friday and he said everything looked good in the knee.  States his knees feels alot better.  STate she got a shot in his back.  Currentlty without pain in his Rt knee. Pain Assessment Currently in Pain?: No/denies   Exercise/Treatments Stretches Gastroc Stretch: Limitations;3 reps;30 seconds Gastroc Stretch Limitations: slant board AStanding Heel Raises: 10 reps;Limitations Heel Raises Limitations: toeraises 10reps Knee Flexion: Right;15 reps Supine Quad Sets: 15 reps;Limitations Quad Sets Limitations: 10" holds Short Arc [employer]: 15 reps Short Arc Quad Sets Limitations: 10" holds Knee Extension: PROM;Right;3 sets Prone  Prone Knee Hang: 5 minutes;Limitations Prone Knee Hang Limitations: with manual MFR hamstring region   Modalities Modalities: Cryotherapy Manual Therapy Other Manual Therapy: Retro massage to Rt knee in supine, Myofascial release to posterior/lateral knee in prone with hang Cryotherapy Number Minutes Cryotherapy: 15 Minutes Cryotherapy Location: Knee Type of Cryotherapy: Ice pack  Physical Therapy Assessment and Plan PT Assessment and Plan Clinical Impression Statement: Resumed standing activities and focused on Rt knee extension.  Able to achieve 14 degrees AROM (was 22) and 7 degrees PROM in supine.  Pt with moderate adhesions posterior lateral knee.  Good tolerance to PROM and mobiiizations to Rt knee. PT Plan: Continue with POC to improve knee extension and  improve flexibility.       Problem List Patient Active Problem List   Diagnosis Date Noted  . Postoperative stiffness of total knee replacement 06/21/2013  . Pain in right knee 06/21/2013  . Difficulty in walking(719.7) 06/21/2013  . S/P right TKA 05/30/2013  . Abdominal pain, right upper quadrant 12/31/2012  . Abnormal transaminases 12/31/2012  . Thrombocytopenia, unspecified 12/31/2012    PT - End of Session Activity Tolerance: Patient tolerated treatment well General Behavior During Therapy: WFL for tasks assessed/performed   Teena Irani, PTA/CLT 07/04/2013, 12:10 PM

## 2013-07-06 ENCOUNTER — Ambulatory Visit (HOSPITAL_COMMUNITY)
Admission: RE | Admit: 2013-07-06 | Discharge: 2013-07-06 | Disposition: A | Discharge status: Home / Self Care | Payer: Medicare HMO | Source: Ambulatory Visit | Attending: Family Medicine | Admitting: Family Medicine

## 2013-07-06 NOTE — Progress Notes (Signed)
Physical Therapy Treatment Patient Details  Name: Wayne Ortiz MRN: [id number] Date of Birth: Oct 29, 1945  Today's Date: 07/06/2013 Time: 1345-1430 PT Time Calculation (min): 45 min Charges: Therex x 40'(9811-9147) Manual x 82'(9562-1308)  Visit#: 3 of 12  Re-eval: 07/21/13  Authorization: [place] medicare    Subjective: Symptoms/Limitations Symptoms: Pt reports continued HEP compliance. Pain Assessment Currently in Pain?: No/denies  Exercise/Treatments Standing Heel Raises: 10 reps;Limitations Heel Raises Limitations: toeraises 10reps Knee Flexion: Right;15 reps Lateral Step Up: 10 reps;Step Height: 4";Right Forward Step Up: 10 reps;Step Height: 6";Right Rocker Board: 2 minutes;Limitations Rocker Board Limitations: R/L intermittent HHA Supine Short Arc Quad Sets: 15 reps Short Arc Quad Sets Limitations: 10" holds Terminal Knee Extension: 10 reps;Limitations Terminal Knee Extension Limitations: 5" holds Knee Extension: PROM   Manual Therapy Manual Therapy: Myofascial release Myofascial Release: MFR competed to right hamsting to decrease tighness and improve motion with prone knee hange  Physical Therapy Assessment and Plan PT Assessment and Plan Clinical Impression Statement: Pt completes therex well after initial cueing and demo. Pt requires multimodal cueing to avoid dropping left hip when completing lateral step ups. MFR completed to right hamstring to decrease tightness and improve motion. PT Plan: Continue with POC to improve knee extension and improve flexibility.       Problem List Patient Active Problem List   Diagnosis Date Noted  . Postoperative stiffness of total knee replacement 06/21/2013  . Pain in right knee 06/21/2013  . Difficulty in walking(719.7) 06/21/2013  . S/P right TKA 05/30/2013  . Abdominal pain, right upper quadrant 12/31/2012  . Abnormal transaminases 12/31/2012  . Thrombocytopenia, unspecified 12/31/2012    PT - End of  Session Activity Tolerance: Patient tolerated treatment well General Behavior During Therapy: [facility] for tasks assessed/performed  Rachelle Hora, PTA  07/06/2013, 4:50 PM

## 2013-07-07 ENCOUNTER — Ambulatory Visit (HOSPITAL_COMMUNITY): Payer: Medicare HMO | Admitting: *Deleted

## 2013-07-08 ENCOUNTER — Ambulatory Visit (HOSPITAL_COMMUNITY): Payer: Medicare HMO | Admitting: Physical Therapy

## 2013-07-08 ENCOUNTER — Telehealth (HOSPITAL_COMMUNITY): Payer: Self-pay

## 2013-09-21 NOTE — Progress Notes (Signed)
  Patient Details  Name: TESLA BOCHICCHIO MRN: [id number] Date of Birth: February 17, 1946  Today's Date: 09/21/2013  Pt has not returned to treatment since 07/06/2013 will discharge.  Pt seen 3/12 visits. Re-eval: 07/21/13   Leeroy Cha 09/21/2013, 11:29 AM

## 2013-09-21 NOTE — Addendum Note (Signed)
Encounter addended by: Leeroy Cha, PT on: 09/21/2013 11:30 AM<BR>     Documentation filed: Notes Section, Episodes

## 2014-01-03 ENCOUNTER — Ambulatory Visit (INDEPENDENT_AMBULATORY_CARE_PROVIDER_SITE_OTHER): Payer: Medicare Other | Admitting: Internal Medicine

## 2014-06-13 DIAGNOSIS — R9430 Abnormal result of cardiovascular function study, unspecified: Secondary | ICD-10-CM | POA: Insufficient documentation

## 2014-06-16 ENCOUNTER — Other Ambulatory Visit (HOSPITAL_COMMUNITY): Payer: Self-pay | Admitting: Neurosurgery

## 2014-06-16 DIAGNOSIS — M4726 Other spondylosis with radiculopathy, lumbar region: Secondary | ICD-10-CM

## 2014-06-21 ENCOUNTER — Ambulatory Visit (HOSPITAL_COMMUNITY)
Admission: RE | Admit: 2014-06-21 | Discharge: 2014-06-21 | Disposition: A | Discharge status: Home / Self Care | Payer: Medicare HMO | Source: Ambulatory Visit | Attending: Neurosurgery | Admitting: Neurosurgery

## 2014-06-21 DIAGNOSIS — M4806 Spinal stenosis, lumbar region: Secondary | ICD-10-CM | POA: Diagnosis not present

## 2014-06-21 DIAGNOSIS — M2578 Osteophyte, vertebrae: Secondary | ICD-10-CM | POA: Diagnosis not present

## 2014-06-21 DIAGNOSIS — M5126 Other intervertebral disc displacement, lumbar region: Secondary | ICD-10-CM | POA: Insufficient documentation

## 2014-06-21 DIAGNOSIS — M545 Low back pain: Secondary | ICD-10-CM | POA: Diagnosis present

## 2014-06-21 DIAGNOSIS — M4726 Other spondylosis with radiculopathy, lumbar region: Secondary | ICD-10-CM

## 2014-06-22 DIAGNOSIS — Z955 Presence of coronary angioplasty implant and graft: Secondary | ICD-10-CM | POA: Insufficient documentation

## 2014-06-22 DIAGNOSIS — Q245 Malformation of coronary vessels: Secondary | ICD-10-CM | POA: Insufficient documentation

## 2014-08-04 ENCOUNTER — Ambulatory Visit (INDEPENDENT_AMBULATORY_CARE_PROVIDER_SITE_OTHER): Payer: Medicare HMO | Admitting: Cardiology

## 2014-08-04 ENCOUNTER — Encounter: Payer: Self-pay | Admitting: Cardiology

## 2014-08-04 VITALS — BP 130/76 | HR 65 | Ht 71.0 in | Wt 270.0 lb

## 2014-08-04 DIAGNOSIS — I4891 Unspecified atrial fibrillation: Secondary | ICD-10-CM | POA: Diagnosis not present

## 2014-08-04 MED ORDER — LISINOPRIL 10 MG PO TABS: 10.0000 mg | ORAL_TABLET | Freq: Every day | ORAL | Status: DC

## 2014-08-04 NOTE — Patient Instructions (Signed)
Your physician recommends that you schedule a follow-up appointment in: 3 months with Dr.Branch     START Lisinopril 10 mg daily    Please get FASTING lab work CBC,CMET,A1C, TSH, Lipid,Magnesium

## 2014-08-04 NOTE — Progress Notes (Signed)
Clinical Summary [name] is a 69 y.o.male seen today as a new patient for the following medical problems. He was prevoiusly followed by Dr Alroy Dust in [place], [place].   1. CAD - cath 06/22/14 at [facility] after abnormal stress test. Received DES to LCX and DES x2  to RCA - original workup started primarily for SOB. - SOB significnatly improved after stents.  - denies any recent chest pain. Mild DOE that has improved - from notes beta blockers caused significant fatigue and he has not been on. He was on lisionpril 20mg  daily however stopped on his own due to symptomatic low bp's.  .    2. HTN - had been on ACE-I, stpped due to low bp's  3. Hyperlipidemia - intolerant to lipitor,zocor,pravastatin due to muslce aches - no recent panel in our system  4. Afib - reports DCCV x2 in the past, most recently 6 months ago. Has been on amio for a time but stopped as he was maintaining NSR. Intolerant to beta blockers - had been on xarelto but stopped due to hematuria. - seen by urology with workup, records are not available at this time. Still having some intermittent hematuria, reports recent uti and possible kidney stone.    Past Medical History  Diagnosis Date  . Thyroid disease   . Hypertension   . Gout   . Femur fracture   . Atrial fibrillation   . Complication of anesthesia     BECAME COMBATIVE  . Shortness of breath     with exertion  . Arthritis   . History of gout   . Precancerous skin lesion   . GERD (gastroesophageal reflux disease)     occasional TUMS  . History of kidney stones   . BPH (benign prostatic hypertrophy)   . Nocturia      No Known Allergies   Current Outpatient Prescriptions  Medication Sig Dispense Refill  . allopurinol (ZYLOPRIM) 300 MG tablet Take 300 mg by mouth every morning.     [name] amiodarone (PACERONE) 100 MG tablet Take 100 mg by mouth every morning.     . docusate sodium 100 MG CAPS Take 100 mg by mouth 2 (two) times daily. 10 capsule 0  .  ferrous sulfate 325 (65 FE) MG tablet Take 1 tablet (325 mg total) by mouth 3 (three) times daily after meals.  3  . HYDROcodone-acetaminophen (NORCO) 7.5-325 MG per tablet Take 1-2 tablets by mouth every 4 (four) hours as needed for moderate pain. 100 tablet 0  . levothyroxine (SYNTHROID, LEVOTHROID) 137 MCG tablet Take 150 mcg by mouth daily before breakfast.     . lisinopril (PRINIVIL,ZESTRIL) 20 MG tablet Take 20 mg by mouth daily after supper.     . polyethylene glycol (MIRALAX / GLYCOLAX) packet Take 17 g by mouth 2 (two) times daily. 14 each 0  . tamsulosin (FLOMAX) 0.4 MG CAPS capsule Take 0.4 mg by mouth every morning.    [name] tiZANidine (ZANAFLEX) 4 MG tablet Take 1 tablet (4 mg total) by mouth every 6 (six) hours as needed for muscle spasms. 50 tablet 0   No current facility-administered medications for this visit.     Past Surgical History  Procedure Laterality Date  . Cervical spine surgery    . Fracture surgery      left arm  . Femur fracture surgery    . Ercp [name] 01/02/2013    Procedure: ENDOSCOPIC RETROGRADE CHOLANGIOPANCREATOGRAPHY (ERCP);  Surgeon: [name], MD;  Location: [name];  Service: Endoscopy;  Laterality: [name];  . Sphincterotomy [name] 01/02/2013    Procedure: SPHINCTEROTOMY;  Surgeon: [name], MD;  Location: [name];  Service: Endoscopy;  Laterality: [name];  Stone Extraction  . Cholecystectomy [name] 01/03/2013    Procedure: LAPAROSCOPIC CHOLECYSTECTOMY;  Surgeon: [name], MD;  Location: [name];  Service: General;  Laterality: [name];  . Liver biopsy [name] 01/03/2013    Procedure: LIVER BIOPSY;  Surgeon: [name], MD;  Location: [name];  Service: General;  Laterality: [name];  . Cataracts      EXCISION  . Total knee arthroplasty Right 05/30/2013    Procedure: RIGHT TOTAL KNEE ARTHROPLASTY;  Surgeon: [name], MD;  Location: [name];  Service: Orthopedics;  Laterality: Right;     No Known Allergies    Family History  Problem Relation Age of Onset  .  Kidney Stones Mother      Social History Mr. Dakin reports that he quit smoking about 41 years ago. He does not have any smokeless tobacco history on file. Mr. Alderman reports that he drinks alcohol.   Review of Systems CONSTITUTIONAL: No weight loss, fever, chills, weakness or fatigue.  HEENT: Eyes: No visual loss, blurred vision, double vision or yellow sclerae.No hearing loss, sneezing, congestion, runny nose or sore throat.  SKIN: No rash or itching.  CARDIOVASCULAR: per HPI RESPIRATORY: No cough or sputum.  GASTROINTESTINAL: No anorexia, nausea, vomiting or diarrhea. No abdominal pain or blood.  GENITOURINARY: occas hematuria NEUROLOGICAL: No headache, dizziness, syncope, paralysis, ataxia, numbness or tingling in the extremities. No change in bowel or bladder control.  MUSCULOSKELETAL: No muscle, back pain, joint pain or stiffness.  LYMPHATICS: No enlarged nodes. No history of splenectomy.  PSYCHIATRIC: No history of depression or anxiety.  ENDOCRINOLOGIC: No reports of sweating, cold or heat intolerance. No polyuria or polydipsia.  [name]   Physical Examination p 85 bp 130/76 Wt 270 lbs BMI 38 Gen: resting comfortably, no acute distress HEENT: no scleral icterus, pupils equal round and reactive, no palptable cervical adenopathy,  CV: RRR, no m/r/g, no JVD, no carotid bruits Resp: Clear to auscultation bilaterally GI: abdomen is soft, non-tender, non-distended, normal bowel sounds, no hepatosplenomegaly MSK: extremities are warm, no edema.  Skin: warm, no rash Neuro:  no focal deficits Psych: appropriate affect   Diagnostic Studies 06/22/14 Cath [facility] DIAGNOSTIC IMPRESSIONS:  90% lesion in mid RCA  90% in distal RCA   80% mid LCX.  PCI:  Percutaneous coronary intervention performed of the 80% mid Circumflex  Stent - 2.5 mm X 15 mm Xience Alpine DES  Result: 0% residual   Percutaneous coronary intervention performed of the 80% mid RCA  Stent - 3.0 mm X 12 mm  Xience Alpine DES  Result: 0% residual   Percutaneous coronary intervention performed of the 90% distal RCA  Stent - 2.5 mm X 18 mm & 2.5 mm X 12 mm Xience Alpine DES  Balloon - 3.0 mm X 12 mm Trek; 2.0 mm X 12 mm Mini Trek  Result: 0% residual  INTERVENTIONAL IMPRESSIONS and RECOMMENDATIONS:  Successful Percutaneous Coronary Intervention performed of the of mid and distal RCA with Xience Alpine DES X 2, and mid Circumflex also with a Xience Alpine DES, all to normal appearance.  Clopidogrel 75 mg daily for 12 months  ASA 81 mg daily  Aggressive secondary prevention  Follow-up with Dr Renee Ramus in [place], [place].     Assessment and Plan  1.  CAD - no current symptoms. Continue DAPT at least until 05/2015  2. HTN - will try starting lower dose of ACE-I for bp effects and cardiovascular benefits in setting of CAD  3. Hyperlipidemia - intolerant to staitns. Will repeat lipid panel, consider zetia pending results  4. Afib - remains in NSR, DCCV aprox 6 months ago - hematuria on xarelto previously, we will request records from his urology workup. He is on DAPT curently for recent stent    F/u 3 months  Arnoldo Lenis, M.D.

## 2014-08-09 LAB — COMPREHENSIVE METABOLIC PANEL
ALK PHOS: 62 U/L (ref 39–117)
ALT: 30 U/L (ref 0–53)
AST: 23 U/L (ref 0–37)
Albumin: 3.8 g/dL (ref 3.5–5.2)
BILIRUBIN TOTAL: 0.4 mg/dL (ref 0.2–1.2)
BUN: 24 mg/dL — AB (ref 6–23)
CO2: 25 mEq/L (ref 19–32)
Calcium: 8.6 mg/dL (ref 8.4–10.5)
Chloride: 106 mEq/L (ref 96–112)
Creat: 1.02 mg/dL (ref 0.50–1.35)
Glucose, Bld: 86 mg/dL (ref 70–99)
POTASSIUM: 5.1 meq/L (ref 3.5–5.3)
SODIUM: 142 meq/L (ref 135–145)
Total Protein: 7 g/dL (ref 6.0–8.3)

## 2014-08-09 LAB — CBC
HCT: 44.2 % (ref 39.0–52.0)
Hemoglobin: 14.4 g/dL (ref 13.0–17.0)
MCH: 32.4 pg (ref 26.0–34.0)
MCHC: 32.6 g/dL (ref 30.0–36.0)
MCV: 99.3 fL (ref 78.0–100.0)
MPV: 9.3 fL (ref 8.6–12.4)
Platelets: 158 10*3/uL (ref 150–400)
RBC: 4.45 MIL/uL (ref 4.22–5.81)
RDW: 14.9 % (ref 11.5–15.5)
WBC: 3.9 10*3/uL — ABNORMAL LOW (ref 4.0–10.5)

## 2014-08-09 LAB — HEMOGLOBIN A1C
HEMOGLOBIN A1C: 5.8 % — AB (ref <5.7)
Mean Plasma Glucose: 120 mg/dL — ABNORMAL HIGH (ref <117)

## 2014-08-09 LAB — LIPID PANEL
Cholesterol: 164 mg/dL (ref 0–200)
HDL: 41 mg/dL (ref >=40)
LDL Cholesterol: 102 mg/dL — ABNORMAL HIGH (ref 0–99)
Total CHOL/HDL Ratio: 4 Ratio
Triglycerides: 104 mg/dL (ref <150)
VLDL: 21 mg/dL (ref 0–40)

## 2014-08-09 LAB — MAGNESIUM: Magnesium: 1.9 mg/dL (ref 1.5–2.5)

## 2014-08-09 LAB — TSH: TSH: 0.49 u[IU]/mL (ref 0.350–4.500)

## 2014-09-11 ENCOUNTER — Encounter (HOSPITAL_COMMUNITY): Payer: Self-pay | Admitting: Emergency Medicine

## 2014-09-11 ENCOUNTER — Emergency Department (HOSPITAL_COMMUNITY): Payer: Medicare HMO

## 2014-09-11 ENCOUNTER — Emergency Department (HOSPITAL_COMMUNITY)
Admission: EM | Admit: 2014-09-11 | Discharge: 2014-09-11 | Disposition: A | Discharge status: Home / Self Care | Payer: Medicare HMO | Attending: Emergency Medicine | Admitting: Emergency Medicine

## 2014-09-11 DIAGNOSIS — M1990 Unspecified osteoarthritis, unspecified site: Secondary | ICD-10-CM | POA: Diagnosis not present

## 2014-09-11 DIAGNOSIS — Z872 Personal history of diseases of the skin and subcutaneous tissue: Secondary | ICD-10-CM | POA: Diagnosis not present

## 2014-09-11 DIAGNOSIS — Z87448 Personal history of other diseases of urinary system: Secondary | ICD-10-CM | POA: Insufficient documentation

## 2014-09-11 DIAGNOSIS — J159 Unspecified bacterial pneumonia: Secondary | ICD-10-CM | POA: Diagnosis not present

## 2014-09-11 DIAGNOSIS — E079 Disorder of thyroid, unspecified: Secondary | ICD-10-CM | POA: Insufficient documentation

## 2014-09-11 DIAGNOSIS — Z8781 Personal history of (healed) traumatic fracture: Secondary | ICD-10-CM | POA: Diagnosis not present

## 2014-09-11 DIAGNOSIS — Z87891 Personal history of nicotine dependence: Secondary | ICD-10-CM | POA: Insufficient documentation

## 2014-09-11 DIAGNOSIS — I491 Atrial premature depolarization: Secondary | ICD-10-CM

## 2014-09-11 DIAGNOSIS — Z7902 Long term (current) use of antithrombotics/antiplatelets: Secondary | ICD-10-CM | POA: Diagnosis not present

## 2014-09-11 DIAGNOSIS — Z87442 Personal history of urinary calculi: Secondary | ICD-10-CM | POA: Diagnosis not present

## 2014-09-11 DIAGNOSIS — I10 Essential (primary) hypertension: Secondary | ICD-10-CM | POA: Diagnosis not present

## 2014-09-11 DIAGNOSIS — Z7982 Long term (current) use of aspirin: Secondary | ICD-10-CM | POA: Insufficient documentation

## 2014-09-11 DIAGNOSIS — R079 Chest pain, unspecified: Secondary | ICD-10-CM | POA: Diagnosis present

## 2014-09-11 LAB — CBC
HEMATOCRIT: 45.8 % (ref 39.0–52.0)
Hemoglobin: 15.1 g/dL (ref 13.0–17.0)
MCH: 32.8 pg (ref 26.0–34.0)
MCHC: 33 g/dL (ref 30.0–36.0)
MCV: 99.3 fL (ref 78.0–100.0)
Platelets: 136 10*3/uL — ABNORMAL LOW (ref 150–400)
RBC: 4.61 MIL/uL (ref 4.22–5.81)
RDW: 13.3 % (ref 11.5–15.5)
WBC: 4.4 10*3/uL (ref 4.0–10.5)

## 2014-09-11 LAB — BASIC METABOLIC PANEL
Anion gap: 7 (ref 5–15)
BUN: 18 mg/dL (ref 6–20)
CALCIUM: 9.5 mg/dL (ref 8.9–10.3)
CHLORIDE: 108 mmol/L (ref 101–111)
CO2: 26 mmol/L (ref 22–32)
Creatinine, Ser: 0.95 mg/dL (ref 0.61–1.24)
GFR calc Af Amer: >60 mL/min (ref >60)
Glucose, Bld: 93 mg/dL (ref 65–99)
Potassium: 4.1 mmol/L (ref 3.5–5.1)
SODIUM: 141 mmol/L (ref 135–145)

## 2014-09-11 LAB — TROPONIN I: Troponin I: <0.03 ng/mL (ref <0.031)

## 2014-09-11 LAB — BRAIN NATRIURETIC PEPTIDE: B Natriuretic Peptide: 178 pg/mL — ABNORMAL HIGH (ref 0.0–100.0)

## 2014-09-11 MED ORDER — METOPROLOL TARTRATE 25 MG PO TABS: 12.5000 mg | ORAL_TABLET | Freq: Two times a day (BID) | ORAL | Status: DC

## 2014-09-11 NOTE — ED Provider Notes (Signed)
CSN: [id number]     Arrival date & time 09/11/14  1210 History   First MD Initiated Contact with Patient 09/11/14 1549     Chief Complaint  Patient presents with  . Chest Pain      HPI  Expand All Collapse All   Pt states that he walked up a hill this morning and started having chest pain with shortness of breath. States that pain has resolved at this time but still feels as if he needs to take a deep breath.  Feeling some palpitations in his chest.  Denies diaphoresis, nausea, vomiting.          Past Medical History  Diagnosis Date  . Thyroid disease   . Hypertension   . Gout   . Femur fracture   . Atrial fibrillation   . Complication of anesthesia     BECAME COMBATIVE  . Shortness of breath     with exertion  . Arthritis   . History of gout   . Precancerous skin lesion   . GERD (gastroesophageal reflux disease)     occasional TUMS  . History of kidney stones   . BPH (benign prostatic hypertrophy)   . Nocturia    Past Surgical History  Procedure Laterality Date  . Cervical spine surgery    . Fracture surgery      left arm  . Femur fracture surgery    . Ercp [name] 01/02/2013    Procedure: ENDOSCOPIC RETROGRADE CHOLANGIOPANCREATOGRAPHY (ERCP);  Surgeon: [name], MD;  Location: [name];  Service: Endoscopy;  Laterality: [name];  . Sphincterotomy [name] 01/02/2013    Procedure: SPHINCTEROTOMY;  Surgeon: [name], MD;  Location: [name];  Service: Endoscopy;  Laterality: [name];  Stone Extraction  . Cholecystectomy [name] 01/03/2013    Procedure: LAPAROSCOPIC CHOLECYSTECTOMY;  Surgeon: [name], MD;  Location: [name];  Service: General;  Laterality: [name];  . Liver biopsy [name] 01/03/2013    Procedure: LIVER BIOPSY;  Surgeon: [name], MD;  Location: [name];  Service: General;  Laterality: [name];  . Cataracts      EXCISION  . Total knee arthroplasty Right 05/30/2013    Procedure: RIGHT TOTAL KNEE ARTHROPLASTY;  Surgeon: [name], MD;  Location: [name];  Service:  Orthopedics;  Laterality: Right;   Family History  Problem Relation Age of Onset  . Kidney Stones Mother    History  Substance Use Topics  . Smoking status: Former Smoker    Quit date: 05/23/1973  . Smokeless tobacco: Not on file  . Alcohol Use: Yes     Comment: RARE    Review of Systems  All other systems reviewed and are negative.     Allergies  Review of patient's allergies indicates no known allergies.  Home Medications   Prior to Admission medications   Medication Sig Start Date End Date Taking? Authorizing Provider  allopurinol (ZYLOPRIM) 300 MG tablet Take 300 mg by mouth every morning.     Historical Provider, MD  aspirin EC 81 MG tablet Take 81 mg by mouth daily.    Historical Provider, MD  clopidogrel (PLAVIX) 75 MG tablet Take 75 mg by mouth daily.  06/23/14 06/23/15  Historical Provider, MD  diclofenac (VOLTAREN) 75 MG EC tablet Take 75 mg by mouth daily.     Historical Provider, MD  docusate sodium 100 MG CAPS Take 100 mg by mouth 2 (two) times daily. 05/31/13   Danae Orleans, PA-C  HYDROcodone-acetaminophen ([place]) 7.5-325 MG  per tablet Take 1-2 tablets by mouth every 4 (four) hours as needed for moderate pain. 05/31/13   Danae Orleans, PA-C  levothyroxine (SYNTHROID, LEVOTHROID) 137 MCG tablet Take 150 mcg by mouth daily before breakfast.     Historical Provider, MD  lisinopril (PRINIVIL,ZESTRIL) 10 MG tablet Take 1 tablet (10 mg total) by mouth daily. 08/04/14   Arnoldo Lenis, MD  metoprolol (LOPRESSOR) 25 MG tablet Take 0.5 tablets (12.5 mg total) by mouth 2 (two) times daily. 09/11/14   Leonard Schwartz, MD  nitroGLYCERIN (NITROSTAT) 0.4 MG SL tablet Place under the tongue. 06/23/14 06/23/15  Historical Provider, MD  tamsulosin (FLOMAX) 0.4 MG CAPS capsule Take 0.4 mg by mouth every morning.    Historical Provider, MD   BP 117/76 mmHg  Pulse 58  Temp(Src) 98.4 F (36.9 C) (Oral)  Resp 18  Ht 5' 11.5" (1.816 m)  Wt 270 lb (122.471 kg)  BMI 37.14 kg/m2  SpO2  97% Physical Exam  Constitutional: He is oriented to person, place, and time. He appears well-developed and well-nourished. No distress.  HENT:  Head: Normocephalic and atraumatic.  Eyes: Pupils are equal, round, and reactive to light.  Neck: Normal range of motion.  Cardiovascular: Normal rate and intact distal pulses.   Pulmonary/Chest: No respiratory distress. He has no wheezes. He has no rales.    Area indicated has reproducible chest wall pain  Abdominal: Normal appearance. He exhibits no distension.  Musculoskeletal: Normal range of motion.  Neurological: He is alert and oriented to person, place, and time. No cranial nerve deficit.  Skin: Skin is warm and dry. No rash noted.  Psychiatric: He has a normal mood and affect. His behavior is normal.  Nursing note and vitals reviewed.   ED Course  Procedures (including critical care time) Labs Review Labs Reviewed  CBC - Abnormal; Notable for the following:    Platelets 136 (*)    All other components within normal limits  BRAIN NATRIURETIC PEPTIDE - Abnormal; Notable for the following:    B Natriuretic Peptide 178.0 (*)    All other components within normal limits  BASIC METABOLIC PANEL  TROPONIN I    Imaging Review Dg Chest 2 View  09/11/2014   CLINICAL DATA:  Acute chest pain.  EXAM: CHEST  2 VIEW  COMPARISON:  December 31, 2012.  FINDINGS: The heart size and mediastinal contours are within normal limits. Both lungs are clear. No pneumothorax or pleural effusion is noted. Anterior osteophyte formation is noted in lower thoracic spine.  IMPRESSION: No active cardiopulmonary disease.   Electronically Signed   By: Marijo Conception, M.D.   On: 09/11/2014 13:09     Date: 09/11/2014  Rate: 66  Rhythm: normal sinus rhythm with occasional PACs  QRS Axis: normal  Intervals: normal  ST/T Wave abnormalities: normal  Conduction Disutrbances: Incomplete right bundle branch block  Narrative Interpretation:  unremarkable    Discussed with Dr. branch who reviewed the EKG.  Patient was started on 12.5 mg metoprolol twice a day and will follow-up with Dr. branch in the office tomorrow the next day.  Follow-up will be arranged by [name]  MDM   Final diagnoses:  PAC (premature atrial contraction)        Leonard Schwartz, MD 09/11/14 [phone]

## 2014-09-11 NOTE — Discharge Instructions (Signed)
Premature Beats A premature beat is an extra heartbeat that happens earlier than normal. Premature beats are called premature atrial contractions (PACs) or premature ventricular contractions (PVCs) depending on the area of the heart where they start. CAUSES  Premature beats may be brought on by a variety of factors including:  Emotional stress.  Lack of sleep.  Caffeine.  Asthma medicines.  Stimulants.  Herbal teas.  Dietary supplements.  Alcohol. In most cases, premature beats are not dangerous and are not a sign of serious heart disease. Most patients evaluated for premature beats have completely normal heart function. Rarely, premature beats may be a sign of more significant heart problems or medical illness. SYMPTOMS  Premature beats may cause palpitations. This means you feel like your heart is skipping a beat or beating harder than usual. Sometimes, slight chest pain occurs with premature beats, lasting only a few seconds. This pain has been described as a "flopping" feeling inside the chest. In many cases, premature beats do not cause any symptoms and they are only detected when an electrocardiography test (EKG) or heart monitoring is performed. DIAGNOSIS  Your caregiver may run some tests to evaluate your heart such as an EKG or echocardiography. You may need to wear a portable heart monitor for several days to record the electrical activity of your heart. Blood testing may also be performed to check your electrolytes and thyroid function. TREATMENT  Premature beats usually go away with rest. If the problem continues, your caregiver will determine a treatment plan for you.  HOME CARE INSTRUCTIONS  Get plenty of rest over the next few days until your symptoms improve.  Avoid coffee, tea, alcohol, and soda (pop, cola).  Do not smoke. SEEK MEDICAL CARE IF:  Your symptoms continue after 1 to 2 days of rest.  You have new symptoms, such as chest pain or trouble  breathing. SEEK IMMEDIATE MEDICAL CARE IF:  You have severe chest pain or abdominal pain.  You have pain that radiates into the neck, arm, or jaw.  You faint or have extreme weakness.  You have shortness of breath.  Your heartbeat races for more than 5 seconds. MAKE SURE YOU:  Understand these instructions.  Will watch your condition.  Will get help right away if you are not doing well or get worse. Document Released: 05/22/2004 Document Revised: 07/07/2011 Document Reviewed: 12/16/2010 [facility] Patient Information 2015 [place], [place]. This information is not intended to replace advice given to you by your health care provider. Make sure you discuss any questions you have with your health care provider.

## 2014-09-11 NOTE — ED Notes (Signed)
Pt states that he walked up a hill this morning and started having chest pain with shortness of breath.  States that pain has resolved at this time but still feels as if cannot breathe well.

## 2014-09-13 ENCOUNTER — Ambulatory Visit: Payer: Medicare HMO | Admitting: Physician Assistant

## 2014-09-13 ENCOUNTER — Ambulatory Visit (INDEPENDENT_AMBULATORY_CARE_PROVIDER_SITE_OTHER): Payer: Medicare HMO | Admitting: Physician Assistant

## 2014-09-13 ENCOUNTER — Encounter: Payer: Self-pay | Admitting: *Deleted

## 2014-09-13 ENCOUNTER — Encounter: Payer: Self-pay | Admitting: Physician Assistant

## 2014-09-13 VITALS — BP 124/80 | HR 56 | Ht 71.0 in | Wt 271.0 lb

## 2014-09-13 DIAGNOSIS — R079 Chest pain, unspecified: Secondary | ICD-10-CM

## 2014-09-13 DIAGNOSIS — I25118 Atherosclerotic heart disease of native coronary artery with other forms of angina pectoris: Secondary | ICD-10-CM

## 2014-09-13 DIAGNOSIS — I2510 Atherosclerotic heart disease of native coronary artery without angina pectoris: Secondary | ICD-10-CM | POA: Insufficient documentation

## 2014-09-13 DIAGNOSIS — R002 Palpitations: Secondary | ICD-10-CM

## 2014-09-13 DIAGNOSIS — I480 Paroxysmal atrial fibrillation: Secondary | ICD-10-CM | POA: Diagnosis not present

## 2014-09-13 DIAGNOSIS — Z9861 Coronary angioplasty status: Secondary | ICD-10-CM

## 2014-09-13 NOTE — Progress Notes (Signed)
Cardiology Office Note   Date:  09/13/2014   ID:  RUDOLPH DAOUST, DOB 1946-03-15, MRN [id number]  PCP:  [name], MD  Cardiologist: Carlyle Dolly, MD  Chief Complaint: Palpitations    History of Present Illness: Wayne Ortiz is a 69 y.o. male who presents for follow-up after recent emergency room visit. He has a history of CAD status post DES to the circumflex and DES 2 to the RCA in 06/22/14 at [facility]. Shortness of breath significantly improved after stents. He was not on beta blocker because of significant fatigue in the past on them. Lisinopril was stopped because of symptomatic low blood pressures. He does not tolerate statins. He's had atrial fibrillation in the past and underwent DCCV 2 most recently 6 months ago. He was on amiodarone for a time but this was stopped because he was maintaining normal sinus rhythm. He had been on Xarelto but this was stopped due to hematuria.  The patient went to the emergency room 09/11/14 with chest pain and palpitations. EKG was reviewed by Dr. branch that showed normal sinus rhythm with PACs and low-dose metoprolol 12.5 mg twice a day was added. He is here for follow-up.  Patient complains of worsening dyspnea on exertion. He was walking up a hill on Monday and had some chest tightness and shortness of breath that resolved when he sat down in his car. He also feels some fluttering on occasion his chest and was worried he was back in atrial fibrillation. He says he may have had a panic attack and has been under a lot of stress. His wife is currently in the emergency room with heart failure and possible clot. His son got picked up for DUI and concealed weapon charge today.    Past Medical History  Diagnosis Date  . Thyroid disease   . Hypertension   . Gout   . Femur fracture   . Atrial fibrillation   . Complication of anesthesia     BECAME COMBATIVE  . Shortness of breath     with exertion  . Arthritis   . History of gout   . Precancerous  skin lesion   . GERD (gastroesophageal reflux disease)     occasional TUMS  . History of kidney stones   . BPH (benign prostatic hypertrophy)   . Nocturia     Past Surgical History  Procedure Laterality Date  . Cervical spine surgery    . Fracture surgery      left arm  . Femur fracture surgery    . Ercp [name] 01/02/2013    Procedure: ENDOSCOPIC RETROGRADE CHOLANGIOPANCREATOGRAPHY (ERCP);  Surgeon: [name], MD;  Location: [name];  Service: Endoscopy;  Laterality: [name];  . Sphincterotomy [name] 01/02/2013    Procedure: SPHINCTEROTOMY;  Surgeon: [name], MD;  Location: [name];  Service: Endoscopy;  Laterality: [name];  Stone Extraction  . Cholecystectomy [name] 01/03/2013    Procedure: LAPAROSCOPIC CHOLECYSTECTOMY;  Surgeon: [name], MD;  Location: [name];  Service: General;  Laterality: [name];  . Liver biopsy [name] 01/03/2013    Procedure: LIVER BIOPSY;  Surgeon: [name], MD;  Location: [name];  Service: General;  Laterality: [name];  . Cataracts      EXCISION  . Total knee arthroplasty Right 05/30/2013    Procedure: RIGHT TOTAL KNEE ARTHROPLASTY;  Surgeon: [name], MD;  Location: [name];  Service: Orthopedics;  Laterality: Right;     Current Outpatient Prescriptions  Medication Sig Dispense Refill  .  allopurinol (ZYLOPRIM) 300 MG tablet Take 300 mg by mouth every morning.     [name] aspirin EC 81 MG tablet Take 81 mg by mouth daily.    . clopidogrel (PLAVIX) 75 MG tablet Take 75 mg by mouth daily.     . diclofenac (VOLTAREN) 75 MG EC tablet Take 75 mg by mouth daily.     [name] docusate sodium 100 MG CAPS Take 100 mg by mouth 2 (two) times daily. 10 capsule 0  . HYDROcodone-acetaminophen (NORCO) 7.5-325 MG per tablet Take 1-2 tablets by mouth every 4 (four) hours as needed for moderate pain. 100 tablet 0  . levothyroxine (SYNTHROID, LEVOTHROID) 137 MCG tablet Take 175 mcg by mouth daily before breakfast.     . metoprolol (LOPRESSOR) 25 MG tablet Take 0.5 tablets (12.5 mg total) by  mouth 2 (two) times daily. 30 tablet 0  . nitroGLYCERIN (NITROSTAT) 0.4 MG SL tablet Place under the tongue.    . tamsulosin (FLOMAX) 0.4 MG CAPS capsule Take 0.4 mg by mouth every morning.     No current facility-administered medications for this visit.    Allergies:   Review of patient's allergies indicates no known allergies.    Social History:  The patient  reports that he quit smoking about 41 years ago. He does not have any smokeless tobacco history on file. He reports that he drinks alcohol. He reports that he does not use illicit drugs.   Family History:  The patient's    family history includes Kidney Stones in his mother.    ROS:  Please see the history of present illness.   Otherwise, review of systems are positive for none.   All other systems are reviewed and negative.    PHYSICAL EXAM: VS:  BP 124/80 mmHg  Pulse 56  Ht 5\' 11"  (1.803 m)  Wt 271 lb (122.925 kg)  BMI 37.81 kg/m2  SpO2 96% , BMI Body mass index is 37.81 kg/(m^2). GEN: Well nourished, well developed, in no acute distress Neck: no JVD, HJR, carotid bruits, or masses Cardiac: RRR with some skipping; no murmurs,gallop, rubs, thrill or heave,  Respiratory:  clear to auscultation bilaterally, normal work of breathing GI: soft, nontender, nondistended, + BS MS: no deformity or atrophy Extremities: without cyanosis, clubbing, edema, good distal pulses bilaterally.  Skin: warm and dry, no rash Neuro:  Strength and sensation are intact    EKG:  EKG is ordered today. The ekg ordered today demonstrates sinus bradycardia 57 bpm with PACs   Recent Labs: 08/08/2014: ALT 30; Magnesium 1.9; TSH 0.490 09/11/2014: B Natriuretic Peptide 178.0*; BUN 18; Creatinine 0.95; Hemoglobin 15.1; Platelets 136*; Potassium 4.1; Sodium 141    Lipid Panel    Component Value Date/Time   CHOL 164 08/08/2014 0711   TRIG 104 08/08/2014 0711   HDL 41 08/08/2014 0711   CHOLHDL 4.0 08/08/2014 0711   VLDL 21 08/08/2014 0711    LDLCALC 102* 08/08/2014 0711      Wt Readings from Last 3 Encounters:  09/13/14 271 lb (122.925 kg)  09/11/14 270 lb (122.471 kg)  08/04/14 270 lb (122.471 kg)      Other studies Reviewed: Additional studies/ records that were reviewed today include and review of the records demonstrates:  Diagnostic Studies 06/22/14 Cath [facility] DIAGNOSTIC IMPRESSIONS:  90% lesion in mid RCA  90% in distal RCA   80% mid LCX.  PCI:  Percutaneous coronary intervention performed of the 80% mid Circumflex  Stent - 2.5 mm X 15 mm Xience  Alpine DES  Result: 0% residual   Percutaneous coronary intervention performed of the 80% mid RCA  Stent - 3.0 mm X 12 mm Xience Alpine DES  Result: 0% residual   Percutaneous coronary intervention performed of the 90% distal RCA  Stent - 2.5 mm X 18 mm & 2.5 mm X 12 mm Xience Alpine DES  Balloon - 3.0 mm X 12 mm Trek; 2.0 mm X 12 mm Mini Trek  Result: 0% residual  INTERVENTIONAL IMPRESSIONS and RECOMMENDATIONS:  Successful Percutaneous Coronary Intervention performed of the of mid and distal RCA with Xience Alpine DES X 2, and mid Circumflex also with a Xience Alpine DES, all to normal appearance.  Clopidogrel 75 mg daily for 12 months  ASA 81 mg daily  Aggressive secondary prevention  Follow-up with Dr Renee Ramus in [place], [place].     ASSESSMENT AND PLAN:  Chest pain Patient had episode of chest pain while walking up an incline associated with dyspnea. He recently had DES to the circumflex and DES 2 to the RCA in 06/22/14 at [facility]. Initially improved greatly after the stent. Recommend stress Myoview. The patient would like to try to walk on the treadmill but will have Lexi scan available if needed. Hold metoprolol morning of stress test.   Palpitations Patient continues to have occasional fluttering in his chest. He is having a lot of PACs on EKG in the ER and currently. He was started on low-dose metoprolol by [name]. We'll  continue this for now. He is under a lot of stress that may be contributing to some of his symptoms. Consider Holter monitor in the future if he continues to have problems. He does have history of atrial fibrillation requiring DCCV.   Paroxysmal atrial fibrillation Status post DCCV 2, most recently 6 months ago. Was on amiodarone for time but was stopped because he was maintaining normal sinus rhythm. Currently in sinus bradycardia with PACs.   CAD (coronary artery disease) CAD status post DES to the circumflex and DES 2 to the RCA in 06/22/14 at [facility]. Follow-up stress Myoview for recent chest pain and dyspnea on exertion     Signed, Ermalinda Barrios, PA-C  09/13/2014 12:51 PM    [facility] [address], [place], [place]  [address] Phone: [phone]; Fax: [phone]

## 2014-09-13 NOTE — Assessment & Plan Note (Signed)
CAD status post DES to the circumflex and DES 2 to the RCA in 06/22/14 at [facility]. Follow-up stress Myoview for recent chest pain and dyspnea on exertion

## 2014-09-13 NOTE — Assessment & Plan Note (Signed)
Patient had episode of chest pain while walking up an incline associated with dyspnea. He recently had DES to the circumflex and DES 2 to the RCA in 06/22/14 at [facility]. Initially improved greatly after the stent. Recommend stress Myoview. The patient would like to try to walk on the treadmill but will have Lexi scan available if needed. Hold metoprolol morning of stress test.

## 2014-09-13 NOTE — Assessment & Plan Note (Signed)
Patient continues to have occasional fluttering in his chest. He is having a lot of PACs on EKG in the ER and currently. He was started on low-dose metoprolol by [name]. We'll continue this for now. He is under a lot of stress that may be contributing to some of his symptoms. Consider Holter monitor in the future if he continues to have problems. He does have history of atrial fibrillation requiring DCCV.

## 2014-09-13 NOTE — Patient Instructions (Signed)
Your physician recommends that you schedule a follow-up appointment in: 3 months with [name]   Your physician has requested that you have en exercise stress myoview. For further information please visit [email or web address]. Please follow instruction sheet, as given.  Your physician recommends that you continue on your current medications as directed. Please refer to the Current Medication list given to you today.  Thank you for choosing [facility]!

## 2014-09-13 NOTE — Assessment & Plan Note (Signed)
Status post DCCV 2, most recently 6 months ago. Was on amiodarone for time but was stopped because he was maintaining normal sinus rhythm. Currently in sinus bradycardia with PACs.

## 2014-09-22 ENCOUNTER — Encounter (HOSPITAL_COMMUNITY)
Admission: RE | Admit: 2014-09-22 | Discharge: 2014-09-22 | Disposition: A | Discharge status: Home / Self Care | Payer: Medicare HMO | Source: Ambulatory Visit | Attending: Physician Assistant | Admitting: Physician Assistant

## 2014-09-22 ENCOUNTER — Inpatient Hospital Stay (HOSPITAL_COMMUNITY): Admission: RE | Admit: 2014-09-22 | Payer: Medicare HMO | Source: Ambulatory Visit

## 2014-09-22 DIAGNOSIS — R002 Palpitations: Secondary | ICD-10-CM | POA: Insufficient documentation

## 2014-09-22 DIAGNOSIS — I4891 Unspecified atrial fibrillation: Secondary | ICD-10-CM | POA: Diagnosis not present

## 2014-09-22 LAB — NM MYOCAR MULTI W/SPECT W/WALL MOTION / EF
CHL CUP NUCLEAR SDS: 1
CHL CUP NUCLEAR SSS: 2
Estimated workload: 7 METS
Exercise duration (min): 5 min
Exercise duration (sec): 32 s
LV dias vol: 92 mL
LVSYSVOL: 42 mL
MPHR: 151 {beats}/min
NUC STRESS TID: 1.45
Nuc Stress EF: 54 %
Peak HR: 131 {beats}/min
Percent HR: 86 %
Rest HR: 66 {beats}/min
SRS: 1

## 2014-09-22 MED ORDER — REGADENOSON 0.4 MG/5ML IV SOLN
INTRAVENOUS | Status: AC
  Filled 2014-09-22: qty 5

## 2014-09-22 MED ORDER — TECHNETIUM TC 99M SESTAMIBI - CARDIOLITE
30.0000 | Freq: Once | INTRAVENOUS | Status: AC | PRN
  Administered 2014-09-22: 30 via INTRAVENOUS

## 2014-09-22 MED ORDER — SODIUM CHLORIDE 0.9 % IJ SOLN
INTRAMUSCULAR | Status: AC
  Filled 2014-09-22: qty 3

## 2014-09-22 MED ORDER — TECHNETIUM TC 99M SESTAMIBI GENERIC - CARDIOLITE
10.0000 | Freq: Once | INTRAVENOUS | Status: AC | PRN
  Administered 2014-09-22: 10 via INTRAVENOUS

## 2014-09-26 ENCOUNTER — Encounter: Payer: Self-pay | Admitting: Cardiology

## 2014-09-26 ENCOUNTER — Ambulatory Visit (INDEPENDENT_AMBULATORY_CARE_PROVIDER_SITE_OTHER): Payer: Medicare HMO | Admitting: Cardiology

## 2014-09-26 VITALS — BP 132/76 | HR 65 | Ht 72.0 in | Wt 265.6 lb

## 2014-09-26 DIAGNOSIS — I480 Paroxysmal atrial fibrillation: Secondary | ICD-10-CM | POA: Diagnosis not present

## 2014-09-26 DIAGNOSIS — I25118 Atherosclerotic heart disease of native coronary artery with other forms of angina pectoris: Secondary | ICD-10-CM | POA: Diagnosis not present

## 2014-09-26 DIAGNOSIS — R079 Chest pain, unspecified: Secondary | ICD-10-CM

## 2014-09-26 DIAGNOSIS — R0602 Shortness of breath: Secondary | ICD-10-CM | POA: Diagnosis not present

## 2014-09-26 MED ORDER — LISINOPRIL 5 MG PO TABS: 5.0000 mg | ORAL_TABLET | Freq: Every day | ORAL | Status: DC

## 2014-09-26 NOTE — Progress Notes (Signed)
Clinical Summary Wayne Ortiz is a 69 y.o.male seen today for follow up of the following medical problems.   1. CAD - cath 06/22/14 at [facility] after abnormal stress test. Received DES to LCX and DES x2 to RCA - original workup started primarily for SOB. - SOB significnatly improved after stents.   - seen in ER 09/11/14 with chest pain. No evidence of ACS. Referred for outpatient exercise MPI that showed moderate inferior defect with mild to moderate peri-infarct ischemia, intemediate risk [facility] score of neg 1, overall intermediate risk.  - went to ER with SOB/ DOE that has been worsening gradually. Notes some chest congestion that clears with cough, but no chest pain. No palpitations. Notes over the last 2-3 days SOB has improved.  - he is not taking lopressor. Stopped lisionpril as well on his own  2. HTN - he stopped taking both his lopressor and lisionpril  3. Hyperlipidemia - intolerant to lipitor,zocor,pravastatin due to muslce aches - 07/2014 TC 164 TG 104 LDL 102  4. Afib - reports DCCV x2 in the past Has been on amio for a time but stopped as he was maintaining NSR. Intolerant to beta blockers - had been on xarelto but stopped due to hematuria. - seen by urology with workup, records are not available at this time. He states he thinks it was all related to a kidney stone. No recent hematuria  5. Tobacco abuse - x 20 years, 1 ppd. Long history of second hand smoke. Worked in [employer].  - + wheezing  Past Medical History  Diagnosis Date  . Thyroid disease   . Hypertension   . Gout   . Femur fracture   . Atrial fibrillation   . Complication of anesthesia     BECAME COMBATIVE  . Shortness of breath     with exertion  . Arthritis   . History of gout   . Precancerous skin lesion   . GERD (gastroesophageal reflux disease)     occasional TUMS  . History of kidney stones   . BPH (benign prostatic hypertrophy)   . Nocturia      No Known Allergies   Current  Outpatient Prescriptions  Medication Sig Dispense Refill  . allopurinol (ZYLOPRIM) 300 MG tablet Take 300 mg by mouth every morning.     [name] aspirin EC 81 MG tablet Take 81 mg by mouth daily.    . clopidogrel (PLAVIX) 75 MG tablet Take 75 mg by mouth daily.     . diclofenac (VOLTAREN) 75 MG EC tablet Take 75 mg by mouth daily.     [name] docusate sodium 100 MG CAPS Take 100 mg by mouth 2 (two) times daily. 10 capsule 0  . HYDROcodone-acetaminophen (NORCO) 7.5-325 MG per tablet Take 1-2 tablets by mouth every 4 (four) hours as needed for moderate pain. 100 tablet 0  . levothyroxine (SYNTHROID, LEVOTHROID) 137 MCG tablet Take 175 mcg by mouth daily before breakfast.     . metoprolol (LOPRESSOR) 25 MG tablet Take 0.5 tablets (12.5 mg total) by mouth 2 (two) times daily. 30 tablet 0  . nitroGLYCERIN (NITROSTAT) 0.4 MG SL tablet Place under the tongue.    . tamsulosin (FLOMAX) 0.4 MG CAPS capsule Take 0.4 mg by mouth every morning.     No current facility-administered medications for this visit.     Past Surgical History  Procedure Laterality Date  . Cervical spine surgery    . Fracture surgery  left arm  . Femur fracture surgery    . Ercp [name] 01/02/2013    Procedure: ENDOSCOPIC RETROGRADE CHOLANGIOPANCREATOGRAPHY (ERCP);  Surgeon: [name], MD;  Location: [name];  Service: Endoscopy;  Laterality: [name];  . Sphincterotomy [name] 01/02/2013    Procedure: SPHINCTEROTOMY;  Surgeon: [name], MD;  Location: [name];  Service: Endoscopy;  Laterality: [name];  Stone Extraction  . Cholecystectomy [name] 01/03/2013    Procedure: LAPAROSCOPIC CHOLECYSTECTOMY;  Surgeon: [name], MD;  Location: [name];  Service: General;  Laterality: [name];  . Liver biopsy [name] 01/03/2013    Procedure: LIVER BIOPSY;  Surgeon: [name], MD;  Location: [name];  Service: General;  Laterality: [name];  . Cataracts      EXCISION  . Total knee arthroplasty Right 05/30/2013    Procedure: RIGHT TOTAL KNEE ARTHROPLASTY;   Surgeon: [name], MD;  Location: [name];  Service: Orthopedics;  Laterality: Right;     No Known Allergies    Family History  Problem Relation Age of Onset  . Kidney Stones Mother      Social History Wayne Ortiz reports that he quit smoking about 41 years ago. He does not have any smokeless tobacco history on file. Wayne Ortiz reports that he drinks alcohol.   Review of Systems CONSTITUTIONAL: No weight loss, fever, chills, weakness or fatigue.  HEENT: Eyes: No visual loss, blurred vision, double vision or yellow sclerae.No hearing loss, sneezing, congestion, runny nose or sore throat.  SKIN: No rash or itching.  CARDIOVASCULAR: per HPI RESPIRATORY: per HPI GASTROINTESTINAL: No anorexia, nausea, vomiting or diarrhea. No abdominal pain or blood.  GENITOURINARY: No burning on urination, no polyuria NEUROLOGICAL: No headache, dizziness, syncope, paralysis, ataxia, numbness or tingling in the extremities. No change in bowel or bladder control.  MUSCULOSKELETAL: No muscle, back pain, joint pain or stiffness.  LYMPHATICS: No enlarged nodes. No history of splenectomy.  PSYCHIATRIC: No history of depression or anxiety.  ENDOCRINOLOGIC: No reports of sweating, cold or heat intolerance. No polyuria or polydipsia.  [name]   Physical Examination Filed Vitals:   09/26/14 1352  BP: 132/76  Pulse: 65   Filed Vitals:   09/26/14 1352  Height: 6' (1.829 m)  Weight: 265 lb 9.6 oz (120.475 kg)    Gen: resting comfortably, no acute distress HEENT: no scleral icterus, pupils equal round and reactive, no palptable cervical adenopathy,  CV: RRR, no m/r/g, no JVD Resp: Clear to auscultation bilaterally GI: abdomen is soft, non-tender, non-distended, normal bowel sounds, no hepatosplenomegaly MSK: extremities are warm, no edema.  Skin: warm, no rash Neuro:  no focal deficits Psych: appropriate affect   Diagnostic Studies 06/22/14 Cath [facility] DIAGNOSTIC IMPRESSIONS:  90% lesion in mid  RCA  90% in distal RCA   80% mid LCX.  PCI:  Percutaneous coronary intervention performed of the 80% mid Circumflex  Stent - 2.5 mm X 15 mm Xience Alpine DES  Result: 0% residual   Percutaneous coronary intervention performed of the 80% mid RCA  Stent - 3.0 mm X 12 mm Xience Alpine DES  Result: 0% residual   Percutaneous coronary intervention performed of the 90% distal RCA  Stent - 2.5 mm X 18 mm & 2.5 mm X 12 mm Xience Alpine DES  Balloon - 3.0 mm X 12 mm Trek; 2.0 mm X 12 mm Mini Trek  Result: 0% residual  INTERVENTIONAL IMPRESSIONS and RECOMMENDATIONS:  Successful Percutaneous Coronary Intervention performed of the of mid and distal RCA with  Xience Alpine DES X 2, and mid Circumflex also with a Xience Alpine DES, all to normal appearance.  Clopidogrel 75 mg daily for 12 months  ASA 81 mg daily  Aggressive secondary prevention  Follow-up with Dr Renee Ramus in [place], [place]    Assessment and Plan  1. CAD - DES to LCX and RCA at [facility] 06/22/14. Recent MPI 08/2014 with inferior scar with mild to moderate peri-infarct iscehmia - denies any recent chest pain, has had some SOB that is improving.  - continue medical therapy including DAPT, if symptoms progress can consider repeat cath.   2. HTN - will try again to start him on low dose lisinopril. He does not want to retry lopressor due ot side effects  3. Hyperlipidemia - intolerant to staitns. He is difficult to keep on meds as he frequently stops them on his own. Continue to counsel on compliance, consider zetia at next visit  4. Afib - hematuria on xarelto previously, we will request records from his urology workup. He is on DAPT curently for recent stent - he is not interested in restarting anticoagulation, he understands the increased stroke risk in not being on anticoaguatlion, and that DAPT is not an equivalent substitute  5. SOB - not clear this is all cardiac. He does have a long tobacco history  and reports intermittent wheezing. Will obtain PFTs.   F/u 3 months   Arnoldo Lenis, M.D.

## 2014-09-26 NOTE — Patient Instructions (Signed)
Your physician recommends that you schedule a follow-up appointment in: 3 months with Dr Harl Bowie   START Lisinopril 5 mg daily    Your physician has recommended that you have a pulmonary function test. Pulmonary Function Tests are a group of tests that measure how well air moves in and out of your lungs.     Thank you for choosing [facility] !

## 2014-09-29 ENCOUNTER — Ambulatory Visit (HOSPITAL_COMMUNITY)
Admission: RE | Admit: 2014-09-29 | Discharge: 2014-09-29 | Disposition: A | Discharge status: Home / Self Care | Payer: Medicare HMO | Source: Ambulatory Visit | Attending: Cardiology | Admitting: Cardiology

## 2014-09-29 DIAGNOSIS — R062 Wheezing: Secondary | ICD-10-CM | POA: Diagnosis not present

## 2014-09-29 DIAGNOSIS — R05 Cough: Secondary | ICD-10-CM | POA: Diagnosis not present

## 2014-09-29 DIAGNOSIS — F17210 Nicotine dependence, cigarettes, uncomplicated: Secondary | ICD-10-CM | POA: Diagnosis not present

## 2014-09-29 DIAGNOSIS — R0602 Shortness of breath: Secondary | ICD-10-CM | POA: Diagnosis not present

## 2014-09-29 DIAGNOSIS — R0609 Other forms of dyspnea: Secondary | ICD-10-CM | POA: Diagnosis not present

## 2014-09-29 LAB — PULMONARY FUNCTION TEST
DL/VA % pred: 83 %
DL/VA: 3.92 ml/min/mmHg/L
DLCO unc % pred: 86 %
DLCO unc: 30.37 ml/min/mmHg
FEF 25-75 POST: 2.41 L/s
FEF 25-75 PRE: 2.45 L/s
FEF2575-%Change-Post: -1 %
FEF2575-%PRED-POST: 90 %
FEF2575-%PRED-PRE: 92 %
FEV1-%CHANGE-POST: 0 %
FEV1-%Pred-Post: 83 %
FEV1-%Pred-Pre: 82 %
FEV1-PRE: 2.9 L
FEV1-Post: 2.92 L
FEV1FVC-%Change-Post: 2 %
FEV1FVC-%PRED-PRE: 101 %
FEV6-%Change-Post: -1 %
FEV6-%PRED-PRE: 85 %
FEV6-%Pred-Post: 84 %
FEV6-POST: 3.79 L
FEV6-PRE: 3.86 L
FEV6FVC-%CHANGE-POST: 0 %
FEV6FVC-%PRED-POST: 105 %
FEV6FVC-%PRED-PRE: 105 %
FVC-%Change-Post: -1 %
FVC-%Pred-Post: 79 %
FVC-%Pred-Pre: 81 %
FVC-Post: 3.79 L
FVC-Pre: 3.86 L
POST FEV1/FVC RATIO: 77 %
POST FEV6/FVC RATIO: 100 %
PRE FEV1/FVC RATIO: 75 %
PRE FEV6/FVC RATIO: 100 %
RV % pred: 55 %
RV: 1.43 L
TLC % pred: 84 %
TLC: 6.26 L

## 2014-09-29 MED ORDER — ALBUTEROL SULFATE (2.5 MG/3ML) 0.083% IN NEBU
2.5000 mg | INHALATION_SOLUTION | Freq: Once | RESPIRATORY_TRACT | Status: AC
  Administered 2014-09-29: 2.5 mg via RESPIRATORY_TRACT

## 2014-10-17 ENCOUNTER — Telehealth: Payer: Self-pay | Admitting: *Deleted

## 2014-10-17 NOTE — Telephone Encounter (Signed)
Patient called and reports that his HR this morning was 116. After walking to the mailbox he sat on the porch and decided to walk around at a brisk walk. Patient reports he is SOB and HR is 143. Patient states his HR is fluctuating around the 80's when he is sitting. Will forward to [name].

## 2014-10-17 NOTE — Telephone Encounter (Signed)
Was heart rate of 116 at rest or after he walked? At rest a normal heart rate is 60-100, with activity depending on how intense heart rate may increase to 130s or 140s. Please clarify what resting heart rates vs exertional heart rates were. How is he checking his heart rate?   Zandra Abts MD

## 2014-10-18 ENCOUNTER — Emergency Department (HOSPITAL_COMMUNITY)
Admission: EM | Admit: 2014-10-18 | Discharge: 2014-10-18 | Disposition: A | Discharge status: Home / Self Care | Payer: Medicare HMO | Attending: Emergency Medicine | Admitting: Emergency Medicine

## 2014-10-18 ENCOUNTER — Emergency Department (HOSPITAL_COMMUNITY): Payer: Medicare HMO

## 2014-10-18 ENCOUNTER — Encounter (HOSPITAL_COMMUNITY): Payer: Self-pay | Admitting: Emergency Medicine

## 2014-10-18 DIAGNOSIS — R079 Chest pain, unspecified: Secondary | ICD-10-CM | POA: Insufficient documentation

## 2014-10-18 DIAGNOSIS — Z7901 Long term (current) use of anticoagulants: Secondary | ICD-10-CM | POA: Diagnosis not present

## 2014-10-18 DIAGNOSIS — Z872 Personal history of diseases of the skin and subcutaneous tissue: Secondary | ICD-10-CM | POA: Diagnosis not present

## 2014-10-18 DIAGNOSIS — R5383 Other fatigue: Secondary | ICD-10-CM | POA: Diagnosis not present

## 2014-10-18 DIAGNOSIS — I480 Paroxysmal atrial fibrillation: Secondary | ICD-10-CM | POA: Insufficient documentation

## 2014-10-18 DIAGNOSIS — M1990 Unspecified osteoarthritis, unspecified site: Secondary | ICD-10-CM | POA: Insufficient documentation

## 2014-10-18 DIAGNOSIS — N289 Disorder of kidney and ureter, unspecified: Secondary | ICD-10-CM | POA: Diagnosis not present

## 2014-10-18 DIAGNOSIS — Z87891 Personal history of nicotine dependence: Secondary | ICD-10-CM | POA: Diagnosis not present

## 2014-10-18 DIAGNOSIS — R0602 Shortness of breath: Secondary | ICD-10-CM | POA: Diagnosis present

## 2014-10-18 DIAGNOSIS — N400 Enlarged prostate without lower urinary tract symptoms: Secondary | ICD-10-CM | POA: Diagnosis not present

## 2014-10-18 DIAGNOSIS — Z87442 Personal history of urinary calculi: Secondary | ICD-10-CM | POA: Insufficient documentation

## 2014-10-18 DIAGNOSIS — Z8781 Personal history of (healed) traumatic fracture: Secondary | ICD-10-CM | POA: Insufficient documentation

## 2014-10-18 DIAGNOSIS — K219 Gastro-esophageal reflux disease without esophagitis: Secondary | ICD-10-CM | POA: Insufficient documentation

## 2014-10-18 DIAGNOSIS — Z79899 Other long term (current) drug therapy: Secondary | ICD-10-CM | POA: Diagnosis not present

## 2014-10-18 DIAGNOSIS — M109 Gout, unspecified: Secondary | ICD-10-CM | POA: Insufficient documentation

## 2014-10-18 DIAGNOSIS — Z791 Long term (current) use of non-steroidal anti-inflammatories (NSAID): Secondary | ICD-10-CM | POA: Insufficient documentation

## 2014-10-18 DIAGNOSIS — E079 Disorder of thyroid, unspecified: Secondary | ICD-10-CM | POA: Diagnosis not present

## 2014-10-18 LAB — TROPONIN I

## 2014-10-18 LAB — BASIC METABOLIC PANEL
ANION GAP: 10 (ref 5–15)
BUN: 25 mg/dL — ABNORMAL HIGH (ref 6–20)
CO2: 21 mmol/L — AB (ref 22–32)
Calcium: 8.9 mg/dL (ref 8.9–10.3)
Chloride: 108 mmol/L (ref 101–111)
Creatinine, Ser: 1.41 mg/dL — ABNORMAL HIGH (ref 0.61–1.24)
GFR calc Af Amer: 57 mL/min — ABNORMAL LOW (ref >60)
GFR, EST NON AFRICAN AMERICAN: 49 mL/min — AB (ref >60)
Glucose, Bld: 129 mg/dL — ABNORMAL HIGH (ref 65–99)
Potassium: 4 mmol/L (ref 3.5–5.1)
SODIUM: 139 mmol/L (ref 135–145)

## 2014-10-18 LAB — CBC WITH DIFFERENTIAL/PLATELET
Basophils Absolute: 0 10*3/uL (ref 0.0–0.1)
Basophils Relative: 0 % (ref 0–1)
EOS PCT: 1 % (ref 0–5)
Eosinophils Absolute: 0.1 10*3/uL (ref 0.0–0.7)
HEMATOCRIT: 48.3 % (ref 39.0–52.0)
Hemoglobin: 16.2 g/dL (ref 13.0–17.0)
LYMPHS ABS: 1.6 10*3/uL (ref 0.7–4.0)
Lymphocytes Relative: 28 % (ref 12–46)
MCH: 33.5 pg (ref 26.0–34.0)
MCHC: 33.5 g/dL (ref 30.0–36.0)
MCV: 100 fL (ref 78.0–100.0)
MONO ABS: 0.5 10*3/uL (ref 0.1–1.0)
Monocytes Relative: 9 % (ref 3–12)
Neutro Abs: 3.5 10*3/uL (ref 1.7–7.7)
Neutrophils Relative %: 62 % (ref 43–77)
PLATELETS: 134 10*3/uL — AB (ref 150–400)
RBC: 4.83 MIL/uL (ref 4.22–5.81)
RDW: 13.2 % (ref 11.5–15.5)
WBC: 5.6 10*3/uL (ref 4.0–10.5)

## 2014-10-18 MED ORDER — SODIUM CHLORIDE 0.9 % IV BOLUS (SEPSIS)
500.0000 mL | Freq: Once | INTRAVENOUS | Status: AC
  Administered 2014-10-18: 500 mL via INTRAVENOUS

## 2014-10-18 MED ORDER — DILTIAZEM HCL 30 MG PO TABS
120.0000 mg | ORAL_TABLET | Freq: Once | ORAL | Status: AC
  Administered 2014-10-18: 120 mg via ORAL
  Filled 2014-10-18: qty 4

## 2014-10-18 MED ORDER — DILTIAZEM HCL ER COATED BEADS 120 MG PO CP24: 120.0000 mg | ORAL_CAPSULE | Freq: Every day | ORAL | Status: DC

## 2014-10-18 MED ORDER — DILTIAZEM HCL 100 MG IV SOLR
5.0000 mg/h | Freq: Once | INTRAVENOUS | Status: AC
  Administered 2014-10-18: 5 mg/h via INTRAVENOUS
  Filled 2014-10-18: qty 100

## 2014-10-18 NOTE — Telephone Encounter (Signed)
Patient reports that he has SOB while lying flat and when exerting himself. HR range from 65 to 115 while resting using a pulse ox. Patient states he has not weighed recently. Patient states that I can feel my heart". When asked to explain, patient states that it does not feel right. Patient asked if he feels any chest pressure or chest pain. Patient said it may be pressure. Patient advised to be evaluated in the ED today. Patient states he does not want to go to ED d/t cost that he would rather have an office visit. Patient would like to know if he should start taking his Pacerone again. Please advise.

## 2014-10-18 NOTE — ED Notes (Signed)
Ambulated patient around nurses station. Patient ambulated with no assistance or difficulty. Patient denies chest pain at this time. Patient complains of "some shortness of breath." Patient able to speak in full sentences during ambulation and immediately after.

## 2014-10-18 NOTE — ED Provider Notes (Signed)
CSN: [id number]     Arrival date & time 10/18/14  1758 History   First MD Initiated Contact with Patient 10/18/14 1818     Chief Complaint  Patient presents with  . Shortness of Breath     Patient is a 69 y.o. male presenting with shortness of breath. The history is provided by the patient.  Shortness of Breath Severity:  Moderate Onset quality:  Gradual Duration:  4 days Timing:  Intermittent Progression:  Worsening Chronicity:  New Relieved by:  Nothing Worsened by:  Nothing tried Associated symptoms: chest pain   Associated symptoms: no abdominal pain, no cough, no fever, no syncope and no vomiting    Pt reports for past 4 days he has felt as though he might be in atrial fibrillation He reports he feels his heart racing.  He feels mild chest pain.  He also feels SOB He has continued his activity including "working in the yard" today He is on ASA/plavix but no other anticoagulants Past Medical History  Diagnosis Date  . Thyroid disease   . Hypertension   . Gout   . Femur fracture   . Atrial fibrillation   . Complication of anesthesia     BECAME COMBATIVE  . Shortness of breath     with exertion  . Arthritis   . History of gout   . Precancerous skin lesion   . GERD (gastroesophageal reflux disease)     occasional TUMS  . History of kidney stones   . BPH (benign prostatic hypertrophy)   . Nocturia    Past Surgical History  Procedure Laterality Date  . Cervical spine surgery    . Fracture surgery      left arm  . Femur fracture surgery    . Ercp [name] 01/02/2013    Procedure: ENDOSCOPIC RETROGRADE CHOLANGIOPANCREATOGRAPHY (ERCP);  Surgeon: [name], MD;  Location: [name];  Service: Endoscopy;  Laterality: [name];  . Sphincterotomy [name] 01/02/2013    Procedure: SPHINCTEROTOMY;  Surgeon: [name], MD;  Location: [name];  Service: Endoscopy;  Laterality: [name];  Stone Extraction  . Cholecystectomy [name] 01/03/2013    Procedure: LAPAROSCOPIC CHOLECYSTECTOMY;  Surgeon:  [name], MD;  Location: [name];  Service: General;  Laterality: [name];  . Liver biopsy [name] 01/03/2013    Procedure: LIVER BIOPSY;  Surgeon: [name], MD;  Location: [name];  Service: General;  Laterality: [name];  . Cataracts      EXCISION  . Total knee arthroplasty Right 05/30/2013    Procedure: RIGHT TOTAL KNEE ARTHROPLASTY;  Surgeon: [name], MD;  Location: [name];  Service: Orthopedics;  Laterality: Right;   Family History  Problem Relation Age of Onset  . Kidney Stones Mother    History  Substance Use Topics  . Smoking status: Former Smoker    Quit date: 05/23/1973  . Smokeless tobacco: Not on file  . Alcohol Use: 0.0 oz/week    0 Standard drinks or equivalent per week     Comment: RARE    Review of Systems  Constitutional: Positive for fatigue. Negative for fever.  Respiratory: Positive for shortness of breath. Negative for cough.   Cardiovascular: Positive for chest pain. Negative for syncope.  Gastrointestinal: Negative for vomiting and abdominal pain.  Neurological: Negative for syncope.  All other systems reviewed and are negative.     Allergies  Review of patient's allergies indicates no known allergies.  Home Medications   Prior to Admission medications  Medication Sig Start Date End Date Taking? Authorizing Provider  allopurinol (ZYLOPRIM) 300 MG tablet Take 300 mg by mouth every morning.     Historical Provider, MD  aspirin EC 81 MG tablet Take 81 mg by mouth daily.    Historical Provider, MD  clopidogrel (PLAVIX) 75 MG tablet Take 75 mg by mouth daily.  06/23/14 06/23/15  Historical Provider, MD  diclofenac (VOLTAREN) 75 MG EC tablet Take 75 mg by mouth daily.     Historical Provider, MD  docusate sodium 100 MG CAPS Take 100 mg by mouth 2 (two) times daily. 05/31/13   Danae Orleans, PA-C  HYDROcodone-acetaminophen (NORCO) 7.5-325 MG per tablet Take 1-2 tablets by mouth every 4 (four) hours as needed for moderate pain. 05/31/13   Danae Orleans, PA-C   levothyroxine (SYNTHROID, LEVOTHROID) 137 MCG tablet Take 175 mcg by mouth daily before breakfast.     Historical Provider, MD  lisinopril (PRINIVIL,ZESTRIL) 5 MG tablet Take 1 tablet (5 mg total) by mouth daily. 09/26/14   Arnoldo Lenis, MD  metoprolol (LOPRESSOR) 25 MG tablet Take 0.5 tablets (12.5 mg total) by mouth 2 (two) times daily. Patient not taking: Reported on 09/26/2014 09/11/14   Leonard Schwartz, MD  nitroGLYCERIN (NITROSTAT) 0.4 MG SL tablet Place under the tongue. 06/23/14 06/23/15  Historical Provider, MD  tamsulosin (FLOMAX) 0.4 MG CAPS capsule Take 0.4 mg by mouth every morning.    Historical Provider, MD   BP 104/88 mmHg  Pulse 27  Temp(Src) 98.2 F (36.8 C) (Oral)  Resp 20  Ht 6' (1.829 m)  Wt 260 lb (117.935 kg)  BMI 35.25 kg/m2  SpO2 97% Physical Exam CONSTITUTIONAL: Well developed/well nourished HEAD: Normocephalic/atraumatic EYES: EOMI/PERRL ENMT: Mucous membranes moist NECK: supple no meningeal signs SPINE/BACK:no cspine tenderness CV: tachycardic, irregular LUNGS: Lungs are clear to auscultation bilaterally, no apparent distress ABDOMEN: soft, nontender, no rebound or guarding, bowel sounds noted throughout abdomen NEURO: Pt is awake/alert/appropriate, moves all extremitiesx4.  No facial droop.   EXTREMITIES: pulses normal/equal, full ROM SKIN: warm, color normal PSYCH: no abnormalities of mood noted, alert and oriented to situation   ED Course  Procedures   Labs Review Labs Reviewed  BASIC METABOLIC PANEL - Abnormal; Notable for the following:    CO2 21 (*)    Glucose, Bld 129 (*)    BUN 25 (*)    Creatinine, Ser 1.41 (*)    GFR calc non Af Amer 49 (*)    GFR calc Af Amer 57 (*)    All other components within normal limits  CBC WITH DIFFERENTIAL/PLATELET - Abnormal; Notable for the following:    Platelets 134 (*)    All other components within normal limits  TROPONIN I  CBC WITH DIFFERENTIAL/PLATELET    Imaging Review Dg Chest Portable 1  View  10/18/2014   CLINICAL DATA:  Progressive shortness of breath for 4 days.  EXAM: PORTABLE CHEST - 1 VIEW  COMPARISON:  09/11/2014  FINDINGS: Heart size and pulmonary vascularity are normal and the lungs are clear. No pneumothorax. No acute osseous abnormality.  IMPRESSION: No active disease.   Electronically Signed   By: Lorriane Shire M.D.   On: 10/18/2014 18:56     EKG Interpretation   Date/Time:  Wednesday October 18 2014 18:17:28 EDT Ventricular Rate:  146 PR Interval:    QRS Duration: 89 QT Interval:  290 QTC Calculation: 452 R Axis:   14 Text Interpretation:  Atrial fibrillation Probable anteroseptal infarct,  old ST depression, probably  rate related atrial fibrillation appears new  Confirmed by Christy Gentles  MD, Mario Coronado ([id number]) on 10/18/2014 6:25:04 PM     6:49 PM Pt with h/o atrial fibrillation currently in afib with RVR He is not on ATC due to previous hematuria Workup pending at this time 7:19 PM Just re-evaluated patient SBP stable HR>140 (he is not bradycardic as indicated by vital signs listed) Will start cardizem drip 7:49 PM Pt with mild hypotension but he is well appearing and nontoxic Will give IV fluids prior to IV cardizem 8:58 PM HR improved but still in afib BP is improved He reports he has tolerated amiodarone in past, he has this at home He refuses anticoagulation tonight (cardiology has noted this previously) Will call cardiology for further guidance on amiodarone usage 9:10 PM D/w dr Ulyses Amor with cardiology Recommends NOT starting amiodarone  Start cardizem 120mg  daily He will help arrange f/u with cardiology 10:00 PM Pt improved Ambulated without difficulty HR controlled BP appropriate I doubt ACS/PE at this time I feel he can be discharged He will start oral cardizem Will f/u with cardiology Discussed strict return precautions BP 128/92 mmHg  Pulse 80  Temp(Src) 98.2 F (36.8 C) (Oral)  Resp 22  Ht 6' (1.829 m)  Wt 260 lb (117.935 kg)   BMI 35.25 kg/m2  SpO2 99%   MDM   Final diagnoses:  Paroxysmal atrial fibrillation  Renal insufficiency    Nursing notes including past medical history and social history reviewed and considered in documentation xrays/imaging reviewed by myself and considered during evaluation Labs/vital reviewed myself and considered during evaluation     Ripley Fraise, MD 10/18/14 2200

## 2014-10-18 NOTE — Discharge Instructions (Signed)
Atrial Fibrillation  Atrial fibrillation is a condition that causes your heart to beat irregularly. It may also cause your heart to beat faster than normal. Atrial fibrillation can prevent your heart from pumping blood normally. It increases your risk of stroke and heart problems.  HOME CARE  · Take medications as told by your doctor.  · Only take medications that your doctor says are safe. Some medications can make the condition worse or happen again.  · If blood thinners were prescribed by your doctor, take them exactly as told. Too much can cause bleeding. Too little and you will not have the needed protection against stroke and other problems.  · Perform blood tests at home if told by your doctor.  · Perform blood tests exactly as told by your doctor.  · Do not drink alcohol.  · Do not drink beverages with caffeine such as coffee, soda, and some teas.  · Maintain a healthy weight.  · Do not use diet pills unless your doctor says they are safe. They may make heart problems worse.  · Follow diet instructions as told by your doctor.  · Exercise regularly as told by your doctor.  · Keep all follow-up appointments.  GET HELP IF:  · You notice a change in the speed, rhythm, or strength of your heartbeat.  · You suddenly begin peeing (urinating) more often.  · You get tired more easily when moving or exercising.  GET HELP RIGHT AWAY IF:   · You have chest or belly (abdominal) pain.  · You feel sick to your stomach (nauseous).  · You are short of breath.  · You suddenly have swollen feet and ankles.  · You feel dizzy.  · You face, arms, or legs feel numb or weak.  · There is a change in your vision or speech.  MAKE SURE YOU:   · Understand these instructions.  · Will watch your condition.  · Will get help right away if you are not doing well or get worse.  Document Released: 01/22/2008 Document Revised: 08/29/2013 Document Reviewed: 05/25/2012  [employer]® Patient Information ©2015 [employer]. This information is not  intended to replace advice given to you by your health care provider. Make sure you discuss any questions you have with your health care provider.

## 2014-10-18 NOTE — Significant Event (Signed)
Spoke on the phone with Dr. Ripley Fraise, MD. Wayne Ortiz presented to [facility] emergency department with complaints of palpitations and not feeling well. On presentation was noted to be in A. fib with RVR with heart rate in 140s. According to Dr. Christy Gentles patient's symptoms probably have started on 10/14/2014. After IV fluid resuscitation patient's heart rate improved to 100 bpm. Per report patient was feeling well and felt that he can go home from the emergency department. Patient has history of intolerance to beta blockers in the past, known history of paroxysmal A. fib, was on amiodarone but that was discontinued as he was in normal sinus rhythm. Patient is currently not on anticoagulation as it was stopped secondary to hematuria in the past. I recommended to start patient on diltiazem long-acting 120 mg daily for rate control. Will arrange outpatient follow-up for him.

## 2014-10-18 NOTE — ED Notes (Signed)
Pt states that he has been having shortness of breath since Sunday progressively getting worse especially at night.

## 2014-10-18 NOTE — ED Notes (Signed)
Gave patient ginger ale as requested and approved by Dr Christy Gentles.

## 2014-10-19 NOTE — Telephone Encounter (Signed)
Can he come see me Monday afternoon 2pm or later  Zandra Abts MD

## 2014-10-19 NOTE — Telephone Encounter (Signed)
Patient seen in ED on 10/18/14

## 2014-10-19 NOTE — Telephone Encounter (Signed)
Patient notified of appt. On Mon. With [name]

## 2014-10-23 ENCOUNTER — Ambulatory Visit (INDEPENDENT_AMBULATORY_CARE_PROVIDER_SITE_OTHER): Payer: Medicare HMO | Admitting: Cardiology

## 2014-10-23 ENCOUNTER — Encounter: Payer: Self-pay | Admitting: Cardiology

## 2014-10-23 VITALS — BP 118/80 | HR 111 | Ht 71.0 in | Wt 266.0 lb

## 2014-10-23 DIAGNOSIS — I4891 Unspecified atrial fibrillation: Secondary | ICD-10-CM | POA: Diagnosis not present

## 2014-10-23 MED ORDER — DILTIAZEM HCL ER COATED BEADS 120 MG PO CP24: 120.0000 mg | ORAL_CAPSULE | Freq: Two times a day (BID) | ORAL | Status: DC

## 2014-10-23 MED ORDER — LISINOPRIL 2.5 MG PO TABS: 2.5000 mg | ORAL_TABLET | Freq: Every day | ORAL | Status: DC

## 2014-10-23 NOTE — Progress Notes (Signed)
Clinical Summary Wayne Ortiz is a 69 y.o.male seen today for follow up of the following medical problems. This is a focused visit on a recent ER visit with afib with RVR, for more detailed medical history please refer to prior clinic notes.   1. Afib - reports DCCV x2 in the past Had been on amio for a time but stopped as he was maintaining NSR. Intolerant to beta blockers, patient previously on lopressor but stopped taking on his own - had been on xarelto but he stopped taking due to hematuria. He has not been interested in restarting anticoag. - seen by urology with workup, records are not available at this time. He states he thinks it was all related to a kidney stone. No recent hematuria  - seen in ER 10/18/14 with afib with RVR. Rates controlled with IV diltiazem, started on oral dilt at discharge.  - since discharge still with some palpitations and fatigue, but notes some mild improvement since starting diltiazem.    Past Medical History  Diagnosis Date  . Thyroid disease   . Hypertension   . Gout   . Femur fracture   . Atrial fibrillation   . Complication of anesthesia     BECAME COMBATIVE  . Shortness of breath     with exertion  . Arthritis   . History of gout   . Precancerous skin lesion   . GERD (gastroesophageal reflux disease)     occasional TUMS  . History of kidney stones   . BPH (benign prostatic hypertrophy)   . Nocturia      No Known Allergies   Current Outpatient Prescriptions  Medication Sig Dispense Refill  . acetaminophen (TYLENOL) 500 MG tablet Take 500 mg by mouth every 6 (six) hours as needed for mild pain or moderate pain.    [name] allopurinol (ZYLOPRIM) 300 MG tablet Take 300 mg by mouth every morning.     [name] aspirin EC 81 MG tablet Take 81 mg by mouth every morning.     . clopidogrel (PLAVIX) 75 MG tablet Take 75 mg by mouth every evening.     . diclofenac (VOLTAREN) 75 MG EC tablet Take 75 mg by mouth daily.     [name] diltiazem (CARDIZEM CD) 120 MG  24 hr capsule Take 1 capsule (120 mg total) by mouth daily. 14 capsule 0  . docusate sodium 100 MG CAPS Take 100 mg by mouth 2 (two) times daily. 10 capsule 0  . HYDROcodone-acetaminophen (NORCO) 7.5-325 MG per tablet Take 1-2 tablets by mouth every 4 (four) hours as needed for moderate pain. (Patient taking differently: Take 1-2 tablets by mouth every 4 (four) hours as needed for moderate pain. For pain) 100 tablet 0  . levothyroxine (SYNTHROID, LEVOTHROID) 175 MCG tablet Take 175 mcg by mouth daily before breakfast.    . lisinopril (PRINIVIL,ZESTRIL) 5 MG tablet Take 1 tablet (5 mg total) by mouth daily. (Patient taking differently: Take 5 mg by mouth every evening. ) 90 tablet 3  . nitroGLYCERIN (NITROSTAT) 0.4 MG SL tablet Place 0.4 mg under the tongue every 5 (five) minutes as needed for chest pain.     . tamsulosin (FLOMAX) 0.4 MG CAPS capsule Take 0.4 mg by mouth every morning.     No current facility-administered medications for this visit.     Past Surgical History  Procedure Laterality Date  . Cervical spine surgery    . Fracture surgery      left arm  .  Femur fracture surgery    . Ercp [name] 01/02/2013    Procedure: ENDOSCOPIC RETROGRADE CHOLANGIOPANCREATOGRAPHY (ERCP);  Surgeon: [name], MD;  Location: [name];  Service: Endoscopy;  Laterality: [name];  . Sphincterotomy [name] 01/02/2013    Procedure: SPHINCTEROTOMY;  Surgeon: [name], MD;  Location: [name];  Service: Endoscopy;  Laterality: [name];  Stone Extraction  . Cholecystectomy [name] 01/03/2013    Procedure: LAPAROSCOPIC CHOLECYSTECTOMY;  Surgeon: [name], MD;  Location: [name];  Service: General;  Laterality: [name];  . Liver biopsy [name] 01/03/2013    Procedure: LIVER BIOPSY;  Surgeon: [name], MD;  Location: [name];  Service: General;  Laterality: [name];  . Cataracts      EXCISION  . Total knee arthroplasty Right 05/30/2013    Procedure: RIGHT TOTAL KNEE ARTHROPLASTY;  Surgeon: [name], MD;  Location: [name];   Service: Orthopedics;  Laterality: Right;     No Known Allergies    Family History  Problem Relation Age of Onset  . Kidney Stones Mother      Social History Wayne Ortiz reports that he quit smoking about 41 years ago. He does not have any smokeless tobacco history on file. Wayne Ortiz reports that he drinks alcohol.   Review of Systems CONSTITUTIONAL: No weight loss, fever, chills, weakness or fatigue.  HEENT: Eyes: No visual loss, blurred vision, double vision or yellow sclerae.No hearing loss, sneezing, congestion, runny nose or sore throat.  SKIN: No rash or itching.  CARDIOVASCULAR: per HPI RESPIRATORY: No shortness of breath, cough or sputum.  GASTROINTESTINAL: No anorexia, nausea, vomiting or diarrhea. No abdominal pain or blood.  GENITOURINARY: No burning on urination, no polyuria NEUROLOGICAL: No headache, dizziness, syncope, paralysis, ataxia, numbness or tingling in the extremities. No change in bowel or bladder control.  MUSCULOSKELETAL: No muscle, back pain, joint pain or stiffness.  LYMPHATICS: No enlarged nodes. No history of splenectomy.  PSYCHIATRIC: No history of depression or anxiety.  ENDOCRINOLOGIC: No reports of sweating, cold or heat intolerance. No polyuria or polydipsia.  [name]   Physical Examination Filed Vitals:   10/23/14 1402  BP: 118/80  Pulse: 111   Filed Vitals:   10/23/14 1402  Height: 5\' 11"  (1.803 m)  Weight: 266 lb (120.657 kg)    Gen: resting comfortably, no acute distress HEENT: no scleral icterus, pupils equal round and reactive, no palptable cervical adenopathy,  CV: irreg, no m/r/g, no JVD Resp: Clear to auscultation bilaterally GI: abdomen is soft, non-tender, non-distended, normal bowel sounds, no hepatosplenomegaly MSK: extremities are warm, trace bilateral edema Skin: warm, no rash Neuro:  no focal deficits Psych: appropriate affect   Diagnostic Studies 06/22/14 Cath [facility] DIAGNOSTIC IMPRESSIONS:  90% lesion in mid  RCA  90% in distal RCA   80% mid LCX.  PCI:  Percutaneous coronary intervention performed of the 80% mid Circumflex  Stent - 2.5 mm X 15 mm Xience Alpine DES  Result: 0% residual   Percutaneous coronary intervention performed of the 80% mid RCA  Stent - 3.0 mm X 12 mm Xience Alpine DES  Result: 0% residual   Percutaneous coronary intervention performed of the 90% distal RCA  Stent - 2.5 mm X 18 mm & 2.5 mm X 12 mm Xience Alpine DES  Balloon - 3.0 mm X 12 mm Trek; 2.0 mm X 12 mm Mini Trek  Result: 0% residual  INTERVENTIONAL IMPRESSIONS and RECOMMENDATIONS:  Successful Percutaneous Coronary Intervention performed of the of mid and distal RCA  with Xience Alpine DES X 2, and mid Circumflex also with a Xience Alpine DES, all to normal appearance.  Clopidogrel 75 mg daily for 12 months  ASA 81 mg daily  Aggressive secondary prevention  Follow-up with Dr Renee Ramus in [place], [place]       Assessment and Plan   1. Afib - seen in ER recently with afib with RVR, started on diltiazem - symptoms mildly improved but still ongoing. EKG shows afib rate 110 at rest - will increase diltiazem long acting to 120mg  bid - he continues to not want anticoagulation after previously having hematuria. Counseled on increased risk of CVA, and the inferior protection of ASA and plavix he is on vs anticoag. He remains not interested - f/u 1 weeks, adjust dilt further as needed for rate control. I also decreased his lisionpril to 2.5mg  daily to allow more room with bp. - if symptoms of SOB don't improve with improved control of afib, may need further evaluation for his recent intermediate risk stress test we have been treating medically thus far.     Arnoldo Lenis, M.D.

## 2014-10-23 NOTE — Patient Instructions (Signed)
Your physician recommends that you schedule a follow-up appointment in: 1 week   INCREASE Diltiazem to 120 mg twice a day   DECREASE Lisinopril to 2.5 mg daily    Thank you for choosing [facility] !

## 2014-10-24 ENCOUNTER — Encounter: Payer: Medicare HMO | Admitting: Adult Health

## 2014-11-02 ENCOUNTER — Ambulatory Visit (INDEPENDENT_AMBULATORY_CARE_PROVIDER_SITE_OTHER): Payer: Medicare HMO | Admitting: Adult Health

## 2014-11-02 ENCOUNTER — Other Ambulatory Visit (HOSPITAL_COMMUNITY)
Admission: RE | Admit: 2014-11-02 | Discharge: 2014-11-02 | Disposition: A | Discharge status: Home / Self Care | Payer: Medicare HMO | Source: Ambulatory Visit | Attending: Adult Health | Admitting: Adult Health

## 2014-11-02 ENCOUNTER — Encounter: Payer: Self-pay | Admitting: Adult Health

## 2014-11-02 VITALS — BP 122/74 | HR 86 | Ht 71.0 in | Wt 268.0 lb

## 2014-11-02 DIAGNOSIS — R002 Palpitations: Secondary | ICD-10-CM | POA: Diagnosis not present

## 2014-11-02 DIAGNOSIS — E079 Disorder of thyroid, unspecified: Secondary | ICD-10-CM | POA: Diagnosis present

## 2014-11-02 DIAGNOSIS — I480 Paroxysmal atrial fibrillation: Secondary | ICD-10-CM

## 2014-11-02 LAB — TSH: TSH: 0.152 u[IU]/mL — AB (ref 0.350–4.500)

## 2014-11-02 MED ORDER — APIXABAN 5 MG PO TABS: 5.0000 mg | ORAL_TABLET | Freq: Two times a day (BID) | ORAL | Status: DC

## 2014-11-02 NOTE — Progress Notes (Deleted)
Name: Wayne Ortiz    DOB: 1946/03/19  Age: 69 y.o.  MR#: [id number]       PCP:  [name], MD      Insurance: Payor: [employer] Falling MEDICARE / Plan: [employer] MEDICARE HMO/PPO / Product Type: *No Product type* /   CC:    Chief Complaint  Patient presents with  . Atrial Fibrillation  . Hypertension    VS Filed Vitals:   11/02/14 1505  BP: 122/74  Pulse: 86  Height: 5\' 11"  (1.803 m)  Weight: 268 lb (121.564 kg)  SpO2: 92%    Weights Current Weight  11/02/14 268 lb (121.564 kg)  10/23/14 266 lb (120.657 kg)  10/18/14 260 lb (117.935 kg)    Blood Pressure  BP Readings from Last 3 Encounters:  11/02/14 122/74  10/23/14 118/80  10/18/14 103/81     Admit date:  (Not on file) Last encounter with RMR:  Visit date not found   Allergy Review of patient's allergies indicates no known allergies.  Current Outpatient Prescriptions  Medication Sig Dispense Refill  . acetaminophen (TYLENOL) 500 MG tablet Take 500 mg by mouth every 6 (six) hours as needed for mild pain or moderate pain.    [name] allopurinol (ZYLOPRIM) 300 MG tablet Take 300 mg by mouth every morning.     [name] aspirin EC 81 MG tablet Take 81 mg by mouth every morning.     . clopidogrel (PLAVIX) 75 MG tablet Take 75 mg by mouth every evening.     . diclofenac (VOLTAREN) 75 MG EC tablet Take 75 mg by mouth daily.     [name] diltiazem (CARDIZEM CD) 120 MG 24 hr capsule Take 1 capsule (120 mg total) by mouth 2 (two) times daily. 180 capsule 3  . HYDROcodone-acetaminophen (NORCO) 7.5-325 MG per tablet Take 1-2 tablets by mouth every 4 (four) hours as needed for moderate pain. (Patient taking differently: Take 1-2 tablets by mouth every 4 (four) hours as needed for moderate pain. For pain) 100 tablet 0  . levothyroxine (SYNTHROID, LEVOTHROID) 175 MCG tablet Take 175 mcg by mouth daily before breakfast.    . lisinopril (PRINIVIL,ZESTRIL) 2.5 MG tablet Take 1 tablet (2.5 mg total) by mouth daily. 90 tablet 3  . nitroGLYCERIN (NITROSTAT) 0.4 MG SL  tablet Place 0.4 mg under the tongue every 5 (five) minutes as needed for chest pain.     . tamsulosin (FLOMAX) 0.4 MG CAPS capsule Take 0.4 mg by mouth every morning.     No current facility-administered medications for this visit.    Discontinued Meds:   There are no discontinued medications.  Patient Active Problem List   Diagnosis Date Noted  . Chest pain 09/13/2014  . Palpitations 09/13/2014  . Paroxysmal atrial fibrillation 09/13/2014  . CAD (coronary artery disease) 09/13/2014  . Postoperative stiffness of total knee replacement 06/21/2013  . Pain in right knee 06/21/2013  . Difficulty in walking(719.7) 06/21/2013  . S/P right TKA 05/30/2013  . Abdominal pain, right upper quadrant 12/31/2012  . Abnormal transaminases 12/31/2012  . Thrombocytopenia, unspecified 12/31/2012    LABS    Component Value Date/Time   NA 139 10/18/2014 1936   NA 141 09/11/2014 1249   NA 142 08/08/2014 0711   K 4.0 10/18/2014 1936   K 4.1 09/11/2014 1249   K 5.1 08/08/2014 0711   CL 108 10/18/2014 1936   CL 108 09/11/2014 1249   CL 106 08/08/2014 0711   CO2 21* 10/18/2014 1936   CO2 26 09/11/2014  1249   CO2 25 08/08/2014 0711   GLUCOSE 129* 10/18/2014 1936   GLUCOSE 93 09/11/2014 1249   GLUCOSE 86 08/08/2014 0711   BUN 25* 10/18/2014 1936   BUN 18 09/11/2014 1249   BUN 24* 08/08/2014 0711   CREATININE 1.41* 10/18/2014 1936   CREATININE 0.95 09/11/2014 1249   CREATININE 1.02 08/08/2014 0711   CREATININE 1.23 05/31/2013 0431   CALCIUM 8.9 10/18/2014 1936   CALCIUM 9.5 09/11/2014 1249   CALCIUM 8.6 08/08/2014 0711   GFRNONAA 49* 10/18/2014 1936   GFRNONAA >60 09/11/2014 1249   GFRNONAA 59* 05/31/2013 0431   GFRAA 57* 10/18/2014 1936   GFRAA >60 09/11/2014 1249   GFRAA 68* 05/31/2013 0431   CMP     Component Value Date/Time   NA 139 10/18/2014 1936   K 4.0 10/18/2014 1936   CL 108 10/18/2014 1936   CO2 21* 10/18/2014 1936   GLUCOSE 129* 10/18/2014 1936   BUN 25* 10/18/2014  1936   CREATININE 1.41* 10/18/2014 1936   CREATININE 1.02 08/08/2014 0711   CALCIUM 8.9 10/18/2014 1936   PROT 7.0 08/08/2014 0711   ALBUMIN 3.8 08/08/2014 0711   AST 23 08/08/2014 0711   ALT 30 08/08/2014 0711   ALKPHOS 62 08/08/2014 0711   BILITOT 0.4 08/08/2014 0711   GFRNONAA 49* 10/18/2014 1936   GFRAA 57* 10/18/2014 1936       Component Value Date/Time   WBC 5.6 10/18/2014 1936   WBC 4.4 09/11/2014 1249   WBC 3.9* 08/08/2014 0711   HGB 16.2 10/18/2014 1936   HGB 15.1 09/11/2014 1249   HGB 14.4 08/08/2014 0711   HCT 48.3 10/18/2014 1936   HCT 45.8 09/11/2014 1249   HCT 44.2 08/08/2014 0711   MCV 100.0 10/18/2014 1936   MCV 99.3 09/11/2014 1249   MCV 99.3 08/08/2014 0711    Lipid Panel     Component Value Date/Time   CHOL 164 08/08/2014 0711   TRIG 104 08/08/2014 0711   HDL 41 08/08/2014 0711   CHOLHDL 4.0 08/08/2014 0711   VLDL 21 08/08/2014 0711   LDLCALC 102* 08/08/2014 0711    ABG No results found for: PHART, PCO2ART, PO2ART, HCO3, TCO2, ACIDBASEDEF, O2SAT   Lab Results  Component Value Date   TSH 0.490 08/08/2014   BNP (last 3 results)  Recent Labs  09/11/14 1249  BNP 178.0*    ProBNP (last 3 results) No results for input(s): PROBNP in the last 8760 hours.  Cardiac Panel (last 3 results) No results for input(s): CKTOTAL, CKMB, TROPONINI, RELINDX in the last 72 hours.  Iron/TIBC/Ferritin/ %Sat No results found for: IRON, TIBC, FERRITIN, IRONPCTSAT   EKG Orders placed or performed in visit on 11/02/14  . EKG 12-Lead     Prior Assessment and Plan Problem List as of 11/02/2014    Abdominal pain, right upper quadrant   Abnormal transaminases   Thrombocytopenia, unspecified   S/P right TKA   Postoperative stiffness of total knee replacement   Pain in right knee   Difficulty in walking(719.7)   Chest pain   Last Assessment & Plan 09/13/2014 Office Visit Written 09/13/2014 12:53 PM by Imogene Burn, PA-C    Patient had episode of chest  pain while walking up an incline associated with dyspnea. He recently had DES to the circumflex and DES 2 to the RCA in 06/22/14 at [facility]. Initially improved greatly after the stent. Recommend stress Myoview. The patient would like to try to walk on the treadmill but will  have Lexi scan available if needed. Hold metoprolol morning of stress test.      Palpitations   Last Assessment & Plan 09/13/2014 Office Visit Written 09/13/2014 12:54 PM by Imogene Burn, PA-C    Patient continues to have occasional fluttering in his chest. He is having a lot of PACs on EKG in the ER and currently. He was started on low-dose metoprolol by [name]. We'll continue this for now. He is under a lot of stress that may be contributing to some of his symptoms. Consider Holter monitor in the future if he continues to have problems. He does have history of atrial fibrillation requiring DCCV.      Paroxysmal atrial fibrillation   Last Assessment & Plan 09/13/2014 Office Visit Written 09/13/2014 12:55 PM by Imogene Burn, PA-C    Status post DCCV 2, most recently 6 months ago. Was on amiodarone for time but was stopped because he was maintaining normal sinus rhythm. Currently in sinus bradycardia with PACs.      CAD (coronary artery disease)   Last Assessment & Plan 09/13/2014 Office Visit Written 09/13/2014 12:56 PM by Imogene Burn, PA-C    CAD status post DES to the circumflex and DES 2 to the RCA in 06/22/14 at [facility]. Follow-up stress Myoview for recent chest pain and dyspnea on exertion          Imaging: Dg Chest Portable 1 View  10/18/2014   CLINICAL DATA:  Progressive shortness of breath for 4 days.  EXAM: PORTABLE CHEST - 1 VIEW  COMPARISON:  09/11/2014  FINDINGS: Heart size and pulmonary vascularity are normal and the lungs are clear. No pneumothorax. No acute osseous abnormality.  IMPRESSION: No active disease.   Electronically Signed   By: Lorriane Shire M.D.   On: 10/18/2014 18:56

## 2014-11-02 NOTE — Progress Notes (Signed)
Cardiology Office Note   Date:  11/02/2014   ID:  Wayne Ortiz, DOB 07-Aug-1945, MRN [id number]  PCP:  Wayne Hampshire, MD  Cardiologist:  Wayne Spring, NP   Chief Complaint  Patient presents with  . Atrial Fibrillation  . Hypertension      History of Present Illness: Wayne Ortiz is a 69 y.o. male who presents for ongoing assessment and management of CAD with PCI to CX and mid and distal RCA, atrial fib, S/P DCCV x 2, with CHADS VASC Score of 2. He refused anticoagulations due to hematuria. He was last seen by Dr. Harl Ortiz on6/27/2016 after ER visit for Afib RVR. He was increased on diltiazem to 120 mg BID. He is here for 1 week follow up to ascertain need to increase dose based on his response.   He comes today with persistent atrial fibrillation. He states that he is taking the increased dose of diltiazem but continues to have issues with rapid HR with minimal exertion. He staes just walking ot the mailbox causes his HR to increase and he becomes more dyspneic. He denies chest pain,but states he "wants out of this atrial fib." He has been awaking at night with rapid HR and states he takes an extra dose of his thyroid medication which makes him feel better. He has not had TSH checked since April of this year.   Past Medical History  Diagnosis Date  . Thyroid disease   . Hypertension   . Gout   . Femur fracture   . Atrial fibrillation   . Complication of anesthesia     BECAME COMBATIVE  . Shortness of breath     with exertion  . Arthritis   . History of gout   . Precancerous skin lesion   . GERD (gastroesophageal reflux disease)     occasional TUMS  . History of kidney stones   . BPH (benign prostatic hypertrophy)   . Nocturia     Past Surgical History  Procedure Laterality Date  . Cervical spine surgery    . Fracture surgery      left arm  . Femur fracture surgery    . Ercp [name] 01/02/2013    Procedure: ENDOSCOPIC RETROGRADE CHOLANGIOPANCREATOGRAPHY (ERCP);   Surgeon: Wayne Houston, MD;  Location: [name];  Service: Endoscopy;  Laterality: [name];  . Sphincterotomy [name] 01/02/2013    Procedure: SPHINCTEROTOMY;  Surgeon: Wayne Houston, MD;  Location: [name];  Service: Endoscopy;  Laterality: [name];  Stone Extraction  . Cholecystectomy [name] 01/03/2013    Procedure: LAPAROSCOPIC CHOLECYSTECTOMY;  Surgeon: Wayne So, MD;  Location: [name];  Service: General;  Laterality: [name];  . Liver biopsy [name] 01/03/2013    Procedure: LIVER BIOPSY;  Surgeon: Wayne So, MD;  Location: [name];  Service: General;  Laterality: [name];  . Cataracts      EXCISION  . Total knee arthroplasty Right 05/30/2013    Procedure: RIGHT TOTAL KNEE ARTHROPLASTY;  Surgeon: Wayne Pole, MD;  Location: [name];  Service: Orthopedics;  Laterality: Right;     Current Outpatient Prescriptions  Medication Sig Dispense Refill  . acetaminophen (TYLENOL) 500 MG tablet Take 500 mg by mouth every 6 (six) hours as needed for mild pain or moderate pain.    [name] allopurinol (ZYLOPRIM) 300 MG tablet Take 300 mg by mouth every morning.     [name] aspirin EC 81 MG tablet Take 81 mg by mouth every morning.     . clopidogrel (  PLAVIX) 75 MG tablet Take 75 mg by mouth every evening.     . diclofenac (VOLTAREN) 75 MG EC tablet Take 75 mg by mouth daily.     [name] diltiazem (CARDIZEM CD) 120 MG 24 hr capsule Take 1 capsule (120 mg total) by mouth 2 (two) times daily. 180 capsule 3  . HYDROcodone-acetaminophen (NORCO) 7.5-325 MG per tablet Take 1-2 tablets by mouth every 4 (four) hours as needed for moderate pain. (Patient taking differently: Take 1-2 tablets by mouth every 4 (four) hours as needed for moderate pain. For pain) 100 tablet 0  . levothyroxine (SYNTHROID, LEVOTHROID) 175 MCG tablet Take 175 mcg by mouth daily before breakfast.    . lisinopril (PRINIVIL,ZESTRIL) 2.5 MG tablet Take 1 tablet (2.5 mg total) by mouth daily. 90 tablet 3  . nitroGLYCERIN (NITROSTAT) 0.4 MG SL tablet Place 0.4 mg under the tongue  every 5 (five) minutes as needed for chest pain.     . tamsulosin (FLOMAX) 0.4 MG CAPS capsule Take 0.4 mg by mouth every morning.     No current facility-administered medications for this visit.    Allergies:   Review of patient's allergies indicates no known allergies.    Social History:  The patient  reports that he quit smoking about 41 years ago. He does not have any smokeless tobacco history on file. He reports that he drinks alcohol. He reports that he does not use illicit drugs.   Family History:  The patient's family history includes Kidney Stones in his mother.    ROS: .   All other systems are reviewed and negative.Unless otherwise mentioned in  H&P above.   PHYSICAL EXAM: VS:  BP 122/74 mmHg  Pulse 86  Ht 5\' 11"  (1.803 m)  Wt 268 lb (121.564 kg)  BMI 37.39 kg/m2  SpO2 92% , BMI Body mass index is 37.39 kg/(m^2). GEN: Well nourished, well developed, in no acute distress HEENT: normal Neck: no JVD, carotid bruits, or masses Cardiac: IRRR; no murmurs, rubs, or gallops,no edema  Respiratory:  Clear to auscultation bilaterally, normal work of breathing GI: soft, nontender, nondistended, + BS Obese.  MS: no deformity or atrophy Skin: warm and dry, no rash Neuro:  Strength and sensation are intact Psych: euthymic mood, full affect   EKG: The ekg ordered today demonstrates Atrial fib rate of 115 bpm.    Recent Labs: 08/08/2014: ALT 30; Magnesium 1.9; TSH 0.490 09/11/2014: B Natriuretic Peptide 178.0* 10/18/2014: BUN 25*; Creatinine, Ser 1.41*; Hemoglobin 16.2; Platelets 134*; Potassium 4.0; Sodium 139    Lipid Panel    Component Value Date/Time   CHOL 164 08/08/2014 0711   TRIG 104 08/08/2014 0711   HDL 41 08/08/2014 0711   CHOLHDL 4.0 08/08/2014 0711   VLDL 21 08/08/2014 0711   LDLCALC 102* 08/08/2014 0711      Wt Readings from Last 3 Encounters:  11/02/14 268 lb (121.564 kg)  10/23/14 266 lb (120.657 kg)  10/18/14 260 lb (117.935 kg)      Other  studies Reviewed: Additional studies/ records that were reviewed today include: Recent labs from ER visit.  Review of the above records demonstrates: WNL   ASSESSMENT AND PLAN:  1. Persistent atrial fib: He has had two DCCV but has had recurrence of afib with RVR with recent ER visit. He has taken increase does of diltiazem but continues to have episodes of rapid HR when he exerts himself. He states it makes him feel weak and dyspneic.   On further questioning,  he has symptoms of OSA, per his wife he stops breathing several times a night and gasps for air. He often falls asleep during the day and is tired a lot. I will schedule him for sleep study to ascertain if this is contributing to recurrence of atrial fib. I will begin Eliquis 5 mg BID. He is reluctant to try this as he had some hematuria on Xarelto, but admitted that he had a kidney stone at the time. I have given him two weeks of samples, explaining that if a DCCV is planned he will need to be on anticoagulation.   I would like to repeat his echo but he wishes to wait on this, as he had a stress test that determined that he had normal EF. There were some inferior abnormalities. If sleep study is negative for OSA, may consider repeating DCCV once he is anticoagulated. Will have him follow up with Dr. Harl Ortiz.   He may need better rate control and consider digoxin for this on follow up appt. He is low normal on BP. Will await results of sleep study before making more changes.   2. CAD: Stents to mid and distal RCA and Cx in Feb or 2016. He will remain on Plavix and ASA 81 mg daily. May stop ASA if he will be on long term anticoagulation.  3. Thyroid disease: Check TSH with results to Dr. Everette Rank   Medicines are reviewed at length with the patient today.    Labs/ tests ordered today include: TSH, Sleep study  Orders Placed This Encounter  Procedures  . EKG 12-Lead   Spent 25 minutes with patient and his wife answering questions and  explaining need for sleep study and anticoagulation.  Disposition:   FU with one month. He is given Rx for 30 free of Eliquis if he tolerates the 2 weeks of samples.   Signed, Jory Sims, NP  11/02/2014 3:18 PM    [facility] [address], [place], [place] [address] Phone: [phone]; Fax: [phone]

## 2014-11-02 NOTE — Patient Instructions (Addendum)
Your physician recommends that you schedule a follow-up appointment with Dr. Bronson Ing or K. [place] Nails, NP  Your physician has recommended you make the following change in your medication:   Start Eliquis 5 mg two times daily.   Your physician recommends that you return for lab work in: Today  Thank you for choosing [facility]!

## 2014-11-21 ENCOUNTER — Ambulatory Visit (INDEPENDENT_AMBULATORY_CARE_PROVIDER_SITE_OTHER): Payer: Medicare HMO | Admitting: Cardiology

## 2014-11-21 ENCOUNTER — Encounter: Payer: Self-pay | Admitting: Cardiology

## 2014-11-21 ENCOUNTER — Telehealth: Payer: Self-pay | Admitting: Adult Health

## 2014-11-21 VITALS — BP 118/78 | HR 95 | Ht 71.0 in | Wt 267.0 lb

## 2014-11-21 DIAGNOSIS — R000 Tachycardia, unspecified: Secondary | ICD-10-CM | POA: Diagnosis not present

## 2014-11-21 DIAGNOSIS — I4891 Unspecified atrial fibrillation: Secondary | ICD-10-CM

## 2014-11-21 NOTE — Progress Notes (Signed)
Patient ID: Wayne Ortiz, male   DOB: August 07, 1945, 69 y.o.   MRN: [id number]     Clinical Summary Wayne Ortiz is a 69 y.o.male seen today for follow up of the following medical problems. This is a focused visit on his history of afib, for more detailed history please refer to prior clinic notes.    1. Afib - previously followed by Dr Alroy Dust in [place] - reports DCCV x2 in the past. Had been on amio for a time but stopped by Dr Alroy Dust 06/2014 as he was maintaining NSR  - Intolerant to beta blockers, patient previously on lopressor but stopped taking on his own - recent TSH showed hyperthyroidism, results were forwarded to pcp. - Dr Emilee Hero cut back synthroid  - continued symptoms of palpitations. Reports he is compliant with his diltiazem.  - sleep test coming up for OSA   .   Past Medical History  Diagnosis Date  . Thyroid disease   . Hypertension   . Gout   . Femur fracture   . Atrial fibrillation   . Complication of anesthesia     BECAME COMBATIVE  . Shortness of breath     with exertion  . Arthritis   . History of gout   . Precancerous skin lesion   . GERD (gastroesophageal reflux disease)     occasional TUMS  . History of kidney stones   . BPH (benign prostatic hypertrophy)   . Nocturia      No Known Allergies   Current Outpatient Prescriptions  Medication Sig Dispense Refill  . acetaminophen (TYLENOL) 500 MG tablet Take 500 mg by mouth every 6 (six) hours as needed for mild pain or moderate pain.    [name] allopurinol (ZYLOPRIM) 300 MG tablet Take 300 mg by mouth every morning.     [name] apixaban (ELIQUIS) 5 MG TABS tablet Take 1 tablet (5 mg total) by mouth 2 (two) times daily. 60 tablet 6  . aspirin EC 81 MG tablet Take 81 mg by mouth every morning.     . clopidogrel (PLAVIX) 75 MG tablet Take 75 mg by mouth every evening.     . diclofenac (VOLTAREN) 75 MG EC tablet Take 75 mg by mouth daily.     [name] diltiazem (CARDIZEM CD) 120 MG 24 hr capsule Take 1 capsule (120 mg  total) by mouth 2 (two) times daily. 180 capsule 3  . HYDROcodone-acetaminophen (NORCO) 7.5-325 MG per tablet Take 1-2 tablets by mouth every 4 (four) hours as needed for moderate pain. (Patient taking differently: Take 1-2 tablets by mouth every 4 (four) hours as needed for moderate pain. For pain) 100 tablet 0  . levothyroxine (SYNTHROID, LEVOTHROID) 175 MCG tablet Take 175 mcg by mouth daily before breakfast.    . lisinopril (PRINIVIL,ZESTRIL) 2.5 MG tablet Take 1 tablet (2.5 mg total) by mouth daily. 90 tablet 3  . nitroGLYCERIN (NITROSTAT) 0.4 MG SL tablet Place 0.4 mg under the tongue every 5 (five) minutes as needed for chest pain.     . tamsulosin (FLOMAX) 0.4 MG CAPS capsule Take 0.4 mg by mouth every morning.     No current facility-administered medications for this visit.     Past Surgical History  Procedure Laterality Date  . Cervical spine surgery    . Fracture surgery      left arm  . Femur fracture surgery    . Ercp [name] 01/02/2013    Procedure: ENDOSCOPIC RETROGRADE CHOLANGIOPANCREATOGRAPHY (ERCP);  Surgeon: [name], MD;  Location: [name];  Service: Endoscopy;  Laterality: [name];  . Sphincterotomy [name] 01/02/2013    Procedure: SPHINCTEROTOMY;  Surgeon: [name], MD;  Location: [name];  Service: Endoscopy;  Laterality: [name];  Stone Extraction  . Cholecystectomy [name] 01/03/2013    Procedure: LAPAROSCOPIC CHOLECYSTECTOMY;  Surgeon: [name], MD;  Location: [name];  Service: General;  Laterality: [name];  . Liver biopsy [name] 01/03/2013    Procedure: LIVER BIOPSY;  Surgeon: [name], MD;  Location: [name];  Service: General;  Laterality: [name];  . Cataracts      EXCISION  . Total knee arthroplasty Right 05/30/2013    Procedure: RIGHT TOTAL KNEE ARTHROPLASTY;  Surgeon: [name], MD;  Location: [name];  Service: Orthopedics;  Laterality: Right;     No Known Allergies    Family History  Problem Relation Age of Onset  . Kidney Stones Mother      Social  History Wayne Ortiz reports that he quit smoking about 41 years ago. He does not have any smokeless tobacco history on file. Wayne Ortiz reports that he drinks alcohol.   Review of Systems CONSTITUTIONAL: No weight loss, fever, chills, weakness or fatigue.  HEENT: Eyes: No visual loss, blurred vision, double vision or yellow sclerae.No hearing loss, sneezing, congestion, runny nose or sore throat.  SKIN: No rash or itching.  CARDIOVASCULAR: per HPI RESPIRATORY: No shortness of breath, cough or sputum.  GASTROINTESTINAL: No anorexia, nausea, vomiting or diarrhea. No abdominal pain or blood.  GENITOURINARY: No burning on urination, no polyuria NEUROLOGICAL: No headache, dizziness, syncope, paralysis, ataxia, numbness or tingling in the extremities. No change in bowel or bladder control.  MUSCULOSKELETAL: No muscle, back pain, joint pain or stiffness.  LYMPHATICS: No enlarged nodes. No history of splenectomy.  PSYCHIATRIC: No history of depression or anxiety.  ENDOCRINOLOGIC: No reports of sweating, cold or heat intolerance. No polyuria or polydipsia.  [name]   Physical Examination Filed Vitals:   11/21/14 1341  BP: 118/78  Pulse: 95   Filed Vitals:   11/21/14 1341  Height: 5\' 11"  (1.803 m)  Weight: 267 lb (121.11 kg)    Gen: resting comfortably, no acute distress HEENT: no scleral icterus, pupils equal round and reactive, no palptable cervical adenopathy,  CV: irreg, no m/r/g, no JVD Resp: Clear to auscultation bilaterally GI: abdomen is soft, non-tender, non-distended, normal bowel sounds, no hepatosplenomegaly MSK: extremities are warm, no edema.  Skin: warm, no rash Neuro:  no focal deficits Psych: appropriate affect   Diagnostic Studies     Assessment and Plan  1. Afib - continues to have symptoms - will increase diltiazem to 360mg  daily. He recently filled his 39s which he was taking bid, he will take 240mg  in AM and 120mg  in PM - continue anticoagulation - f/u next  week, if symptoms persist will titrate dilt further and consider referral to EP. He remains sensitive to his afib, did well clinically on amio previously but would be concern about long term side effects. With his CAD options would not use class IC. If symptoms persist will refer to Dr Lovena Le next visit. - f/u OSA eval. His pcp also recently decreased his synthroid due to low TSH.     Arnoldo Lenis, M.D.

## 2014-11-21 NOTE — Patient Instructions (Signed)
Your physician recommends that you schedule a follow-up appointment in: 1 week with [name]  Please TAKE DILTIAZEM 2( 240 MG TOTAL)TABLETS IN THE AM AND 1 TABLET (120 MG)  IN THE EVENING   Thank you for choosing [facility]!

## 2014-11-21 NOTE — Telephone Encounter (Signed)
Apt today at 1:40 pm with Dr Harl Bowie

## 2014-11-21 NOTE — Telephone Encounter (Signed)
Pt is having SOB and rapid heart rate any time he tries to do anything

## 2014-11-22 ENCOUNTER — Ambulatory Visit: Payer: Medicare HMO | Attending: Adult Health | Admitting: Sleep Medicine

## 2014-11-22 ENCOUNTER — Other Ambulatory Visit (HOSPITAL_COMMUNITY): Payer: Self-pay | Admitting: Respiratory Therapy

## 2014-11-22 DIAGNOSIS — R0683 Snoring: Secondary | ICD-10-CM | POA: Insufficient documentation

## 2014-11-22 DIAGNOSIS — G4733 Obstructive sleep apnea (adult) (pediatric): Secondary | ICD-10-CM | POA: Diagnosis present

## 2014-11-22 DIAGNOSIS — I480 Paroxysmal atrial fibrillation: Secondary | ICD-10-CM

## 2014-11-27 ENCOUNTER — Ambulatory Visit (INDEPENDENT_AMBULATORY_CARE_PROVIDER_SITE_OTHER): Payer: Medicare HMO | Admitting: Cardiology

## 2014-11-27 ENCOUNTER — Encounter: Payer: Self-pay | Admitting: Cardiology

## 2014-11-27 VITALS — BP 136/80 | HR 88 | Ht 71.0 in | Wt 269.0 lb

## 2014-11-27 DIAGNOSIS — R002 Palpitations: Secondary | ICD-10-CM | POA: Diagnosis not present

## 2014-11-27 DIAGNOSIS — I25118 Atherosclerotic heart disease of native coronary artery with other forms of angina pectoris: Secondary | ICD-10-CM | POA: Diagnosis not present

## 2014-11-27 DIAGNOSIS — I4891 Unspecified atrial fibrillation: Secondary | ICD-10-CM

## 2014-11-27 MED ORDER — DILTIAZEM HCL ER COATED BEADS 120 MG PO CP24: 240.0000 mg | ORAL_CAPSULE | Freq: Two times a day (BID) | ORAL | Status: DC

## 2014-11-27 NOTE — Patient Instructions (Signed)
Your physician recommends that you schedule a follow-up appointment in: 3 months with Dr Harl Bowie   We will schedule you schedule to see Dr Lovena Le either here or [facility] office   INCREASE Diltiazem to 240 mg twice a day  (Take 2-120 mg tablets in the am, and 2 - 120 mg tablets in the pm)      Thank you for choosing [facility] !

## 2014-11-27 NOTE — Sleep Study (Signed)
  [place]aca A. Merlene Laughter, MD     [email or web address]             NOCTURNAL POLYSOMNOGRAPHY   LOCATION: [facility]   Gender: Not Specified D.O.B: [birth year] Age (years): 63 Referring Provider: Jory Sims NP Height (inches): 72 Interpreting Physician: Phillips Odor MD, ABSM Weight (lbs): 267 RPSGT: Mort Sawyers BMI: 37 MRN: [id number] Neck Size: 18.50 CLINICAL INFORMATION Sleep Study Type: NPSG  Indication for sleep study: Obesity  Epworth Sleepiness Score:  SLEEP STUDY TECHNIQUE As per the AASM Manual for the Scoring of Sleep and Associated Events v2.3 (April 2016) with a hypopnea requiring 4% desaturations.  The channels recorded and monitored were frontal, central and occipital EEG, electrooculogram (EOG), submentalis EMG (chin), nasal and oral airflow, thoracic and abdominal wall motion, anterior tibialis EMG, snore microphone, electrocardiogram, and pulse oximetry.  MEDICATIONS Patient's medications include: [name]. Medications self-administered by patient during sleep study : No sleep medicine administered.  SLEEP ARCHITECTURE The study was initiated at 9:57:47 PM and ended at 4:01:23 AM.  Sleep onset time was 31.6 minutes and the sleep efficiency was 63.8%. The total sleep time was 232.0 minutes.  Stage REM latency was 93.5 minutes.  The patient spent 24.14% of the night in stage N1 sleep, 54.96% in stage N2 sleep, 0.00% in stage N3 and 20.91% in REM.  Alpha intrusion was absent.  Supine sleep was 15.49%.  RESPIRATORY PARAMETERS The overall apnea/hypopnea index (AHI) was 8.3 per hour. There were 30 total apneas, including 5 obstructive, 15 central and 10 mixed apneas. There were 2 hypopneas and 4 RERAs.  The AHI during Stage REM sleep was 7.4 per hour.  AHI while supine was 31.7 per hour.  The mean oxygen saturation was 92.53%. The minimum SpO2 during sleep was 83.00%.  Moderate snoring was noted during this study.  CARDIAC  DATA The 2 lead EKG demonstrated sinus rhythm. The mean heart rate was 70.57 beats per minute. Other EKG findings include: PVCs. LEG MOVEMENT DATA The total PLMS were 97 with a resulting PLMS index of 25.09. Associated arousal with leg movement index was 0.0 .  IMPRESSIONS Mild obstructive sleep apnea not requiring positive pressure treatment. No significant central sleep apnea occurred during this study. Mild oxygen desaturation was noted during this study. The patient snored with Moderate snoring volume. EKG findings include PVCs. Moderate periodic limb movements of sleep occurred during the study. No significant associated arousals.     Delano Metz, MD Diplomate, American Board of Sleep Medicine.

## 2014-11-27 NOTE — Progress Notes (Signed)
Patient ID: Wayne Ortiz, male   DOB: 20-Sep-1945, 69 y.o.   MRN: [id number]     Clinical Summary Wayne Ortiz is a 69 y.o.male seen today for follow up of the following medical problems.This is a focused visit on his history of afib, for more detailed history please refer to prior clinic notes.   1. Afib - previously followed by Dr Alroy Dust in [place] - reports DCCV x2 in the past. Had been on amio for a time but stopped by Dr Alroy Dust 06/2014 as he was maintaining NSR and patient reported side effects - Intolerant to beta blockers, patient previously on lopressor but stopped taking on his own. Tolerating dilt, we have been titrating up each visit due to continued symptomatic afib - recent TSH showed hyperthyroidism, results were forwarded to pcp.Medication has been adjusted - sleep study completed, awaiting results  - continued symptoms of palpitations. Reports he is compliant with his diltiazem.    2. CAD - recent DES to LCX and DES x2 to RCA at [facility] 05/2014 - compliant with meds including ASA and plavix   Past Medical History  Diagnosis Date  . Thyroid disease   . Hypertension   . Gout   . Femur fracture   . Atrial fibrillation   . Complication of anesthesia     BECAME COMBATIVE  . Shortness of breath     with exertion  . Arthritis   . History of gout   . Precancerous skin lesion   . GERD (gastroesophageal reflux disease)     occasional TUMS  . History of kidney stones   . BPH (benign prostatic hypertrophy)   . Nocturia      No Known Allergies   Current Outpatient Prescriptions  Medication Sig Dispense Refill  . acetaminophen (TYLENOL) 500 MG tablet Take 500 mg by mouth every 6 (six) hours as needed for mild pain or moderate pain.    [name] allopurinol (ZYLOPRIM) 300 MG tablet Take 300 mg by mouth every morning.     [name] apixaban (ELIQUIS) 5 MG TABS tablet Take 1 tablet (5 mg total) by mouth 2 (two) times daily. 60 tablet 6  . aspirin EC 81 MG tablet Take 81 mg by mouth every  morning.     . clopidogrel (PLAVIX) 75 MG tablet Take 75 mg by mouth every evening.     . diclofenac (VOLTAREN) 75 MG EC tablet Take 75 mg by mouth daily.     [name] diltiazem (CARDIZEM CD) 120 MG 24 hr capsule Take 1 capsule (120 mg total) by mouth 2 (two) times daily. 180 capsule 3  . HYDROcodone-acetaminophen (NORCO) 7.5-325 MG per tablet Take 1-2 tablets by mouth every 4 (four) hours as needed for moderate pain. (Patient taking differently: Take 1-2 tablets by mouth every 4 (four) hours as needed for moderate pain. For pain) 100 tablet 0  . lisinopril (PRINIVIL,ZESTRIL) 2.5 MG tablet Take 1 tablet (2.5 mg total) by mouth daily. 90 tablet 3  . lisinopril (PRINIVIL,ZESTRIL) 5 MG tablet   3  . nitroGLYCERIN (NITROSTAT) 0.4 MG SL tablet Place 0.4 mg under the tongue every 5 (five) minutes as needed for chest pain.     [name] SYNTHROID 150 MCG tablet TK 1 T PO QD  0  . tamsulosin (FLOMAX) 0.4 MG CAPS capsule Take 0.4 mg by mouth every morning.     No current facility-administered medications for this visit.     Past Surgical History  Procedure Laterality Date  . Cervical spine surgery    .  Fracture surgery      left arm  . Femur fracture surgery    . Ercp [name] 01/02/2013    Procedure: ENDOSCOPIC RETROGRADE CHOLANGIOPANCREATOGRAPHY (ERCP);  Surgeon: [name], MD;  Location: [name];  Service: Endoscopy;  Laterality: [name];  . Sphincterotomy [name] 01/02/2013    Procedure: SPHINCTEROTOMY;  Surgeon: [name], MD;  Location: [name];  Service: Endoscopy;  Laterality: [name];  Stone Extraction  . Cholecystectomy [name] 01/03/2013    Procedure: LAPAROSCOPIC CHOLECYSTECTOMY;  Surgeon: [name], MD;  Location: [name];  Service: General;  Laterality: [name];  . Liver biopsy [name] 01/03/2013    Procedure: LIVER BIOPSY;  Surgeon: [name], MD;  Location: [name];  Service: General;  Laterality: [name];  . Cataracts      EXCISION  . Total knee arthroplasty Right 05/30/2013    Procedure: RIGHT TOTAL KNEE  ARTHROPLASTY;  Surgeon: [name], MD;  Location: [name];  Service: Orthopedics;  Laterality: Right;     No Known Allergies    Family History  Problem Relation Age of Onset  . Kidney Stones Mother      Social History Wayne Ortiz reports that he quit smoking about 41 years ago. He does not have any smokeless tobacco history on file. Wayne Ortiz reports that he drinks alcohol.   Review of Systems CONSTITUTIONAL: No weight loss, fever, chills, weakness or fatigue.  HEENT: Eyes: No visual loss, blurred vision, double vision or yellow sclerae.No hearing loss, sneezing, congestion, runny nose or sore throat.  SKIN: No rash or itching.  CARDIOVASCULAR: per HPI RESPIRATORY: No shortness of breath, cough or sputum.  GASTROINTESTINAL: No anorexia, nausea, vomiting or diarrhea. No abdominal pain or blood.  GENITOURINARY: No burning on urination, no polyuria NEUROLOGICAL: No headache, dizziness, syncope, paralysis, ataxia, numbness or tingling in the extremities. No change in bowel or bladder control.  MUSCULOSKELETAL: No muscle, back pain, joint pain or stiffness.  LYMPHATICS: No enlarged nodes. No history of splenectomy.  PSYCHIATRIC: No history of depression or anxiety.  ENDOCRINOLOGIC: No reports of sweating, cold or heat intolerance. No polyuria or polydipsia.  [name]   Physical Examination Filed Vitals:   11/27/14 1357  BP: 136/80  Pulse: 88   Filed Vitals:   11/27/14 1357  Height: 5\' 11"  (1.803 m)  Weight: 269 lb (122.018 kg)    Gen: resting comfortably, no acute distress HEENT: no scleral icterus, pupils equal round and reactive, no palptable cervical adenopathy,  CV: irreg, normal rate, no m/r/g, no JVD Resp: Clear to auscultation bilaterally GI: abdomen is soft, non-tender, non-distended, normal bowel sounds, no hepatosplenomegaly MSK: extremities are warm, no edema.  Skin: warm, no rash Neuro:  no focal deficits Psych: appropriate affect   Diagnostic  Studies     Assessment and Plan  1. Afib - continues to have symptoms despite increasing dilt, will go up to 240mg  bid as his heart rate and bp appear to be able to tolerate.  - intolerante to beta blockers. CAD with recent stents, cannot use class IC. Previous side effects on amiodarone. May be candidate for dofetilide, will have him referred to Dr Lovena Le. DCCVx2 in the past with afib recurrence, without antiarrhythmic combined do not see long term benefit of repeat DCCV    2. OSA screen - f/u sleep study  3. CAD - recent stents 05/2014 at [facility]. Remains on DAPT. No bleeding on triple therapy, due to stent burden I have continued both ASA and plavix in setting of  eliquis, may consider at 6 months stopping ASA.     Arnoldo Lenis, M.D.

## 2014-12-01 ENCOUNTER — Encounter (HOSPITAL_COMMUNITY): Payer: Self-pay | Admitting: Nurse Practitioner

## 2014-12-01 ENCOUNTER — Other Ambulatory Visit (HOSPITAL_COMMUNITY): Payer: Self-pay | Admitting: Respiratory Therapy

## 2014-12-01 ENCOUNTER — Ambulatory Visit (HOSPITAL_COMMUNITY)
Admission: RE | Admit: 2014-12-01 | Discharge: 2014-12-01 | Disposition: A | Discharge status: Home / Self Care | Payer: Medicare HMO | Source: Ambulatory Visit | Attending: Nurse Practitioner | Admitting: Nurse Practitioner

## 2014-12-01 ENCOUNTER — Other Ambulatory Visit: Payer: Self-pay

## 2014-12-01 ENCOUNTER — Ambulatory Visit (HOSPITAL_BASED_OUTPATIENT_CLINIC_OR_DEPARTMENT_OTHER)
Admission: RE | Admit: 2014-12-01 | Discharge: 2014-12-01 | Disposition: A | Discharge status: Home / Self Care | Payer: Medicare HMO | Source: Ambulatory Visit | Attending: Nurse Practitioner | Admitting: Nurse Practitioner

## 2014-12-01 VITALS — BP 126/84 | HR 73 | Ht 71.0 in | Wt 270.4 lb

## 2014-12-01 DIAGNOSIS — I4819 Other persistent atrial fibrillation: Secondary | ICD-10-CM

## 2014-12-01 DIAGNOSIS — Z87891 Personal history of nicotine dependence: Secondary | ICD-10-CM | POA: Insufficient documentation

## 2014-12-01 DIAGNOSIS — Z79899 Other long term (current) drug therapy: Secondary | ICD-10-CM | POA: Diagnosis not present

## 2014-12-01 DIAGNOSIS — Z955 Presence of coronary angioplasty implant and graft: Secondary | ICD-10-CM | POA: Insufficient documentation

## 2014-12-01 DIAGNOSIS — I340 Nonrheumatic mitral (valve) insufficiency: Secondary | ICD-10-CM | POA: Diagnosis not present

## 2014-12-01 DIAGNOSIS — I480 Paroxysmal atrial fibrillation: Secondary | ICD-10-CM | POA: Diagnosis not present

## 2014-12-01 DIAGNOSIS — G4733 Obstructive sleep apnea (adult) (pediatric): Secondary | ICD-10-CM | POA: Diagnosis not present

## 2014-12-01 DIAGNOSIS — Z7902 Long term (current) use of antithrombotics/antiplatelets: Secondary | ICD-10-CM | POA: Diagnosis not present

## 2014-12-01 DIAGNOSIS — Z7982 Long term (current) use of aspirin: Secondary | ICD-10-CM | POA: Diagnosis not present

## 2014-12-01 DIAGNOSIS — I481 Persistent atrial fibrillation: Secondary | ICD-10-CM | POA: Insufficient documentation

## 2014-12-01 DIAGNOSIS — Z8639 Personal history of other endocrine, nutritional and metabolic disease: Secondary | ICD-10-CM | POA: Insufficient documentation

## 2014-12-01 DIAGNOSIS — Z6837 Body mass index (BMI) 37.0-37.9, adult: Secondary | ICD-10-CM | POA: Insufficient documentation

## 2014-12-01 DIAGNOSIS — I10 Essential (primary) hypertension: Secondary | ICD-10-CM | POA: Insufficient documentation

## 2014-12-01 DIAGNOSIS — E0590 Thyrotoxicosis, unspecified without thyrotoxic crisis or storm: Secondary | ICD-10-CM | POA: Insufficient documentation

## 2014-12-01 DIAGNOSIS — K219 Gastro-esophageal reflux disease without esophagitis: Secondary | ICD-10-CM | POA: Diagnosis not present

## 2014-12-01 DIAGNOSIS — E669 Obesity, unspecified: Secondary | ICD-10-CM | POA: Diagnosis not present

## 2014-12-01 DIAGNOSIS — I2510 Atherosclerotic heart disease of native coronary artery without angina pectoris: Secondary | ICD-10-CM | POA: Insufficient documentation

## 2014-12-01 LAB — TSH: TSH: 0.314 u[IU]/mL — ABNORMAL LOW (ref 0.350–4.500)

## 2014-12-01 LAB — BASIC METABOLIC PANEL
Anion gap: 6 (ref 5–15)
BUN: 19 mg/dL (ref 6–20)
CHLORIDE: 105 mmol/L (ref 101–111)
CO2: 26 mmol/L (ref 22–32)
CREATININE: 1.03 mg/dL (ref 0.61–1.24)
Calcium: 9.5 mg/dL (ref 8.9–10.3)
GFR calc non Af Amer: >60 mL/min (ref >60)
GLUCOSE: 87 mg/dL (ref 65–99)
POTASSIUM: 4.3 mmol/L (ref 3.5–5.1)
Sodium: 137 mmol/L (ref 135–145)

## 2014-12-01 LAB — CBC
HCT: 48.4 % (ref 39.0–52.0)
HEMOGLOBIN: 16.1 g/dL (ref 13.0–17.0)
MCH: 32.7 pg (ref 26.0–34.0)
MCHC: 33.3 g/dL (ref 30.0–36.0)
MCV: 98.4 fL (ref 78.0–100.0)
PLATELETS: 126 10*3/uL — AB (ref 150–400)
RBC: 4.92 MIL/uL (ref 4.22–5.81)
RDW: 13.3 % (ref 11.5–15.5)
WBC: 4.5 10*3/uL (ref 4.0–10.5)

## 2014-12-01 NOTE — Progress Notes (Signed)
  Echocardiogram 2D Echocardiogram has been performed.  Jennette Dubin 12/01/2014, 1:00 PM

## 2014-12-01 NOTE — Progress Notes (Signed)
Patient ID: OLAF MESA, male   DOB: 07-24-1945, 69 y.o.   MRN: [id number]     Primary Care Physician: [name], MD Referring Physician:Dr. Karmine Kauer is a 69 y.o. male with a h/o CAD- recent DES to LCX and DES x2 to RCA at [facility] 05/2014, persistent afib, that had been on amiodarone until several months ago at which time it was stopped due to pt, "staying in rhythm". Afib has been treated by Dr. Alroy Dust in [place] in the past. [name] has been rate controlling most recent bout of afib and v rate is now in the 70's and the pt is feeling better but adds he is terriblly  symptomatic when in afib with exertional dyspneic. He recently had a sleep study and showed mild sleep apnea, cpap not recommended, per report in the computer. Pt has not heard results yet. His thyroid medicine has also been reduced within the last month for thyroid being over active. He has had DCCV x2 in the remote past. Apixaban has been added for chadsvasc score of at least 3 but now is on triple therapy along with plavix and asa. He does not drink or smoke. Has obesity, cannot exercise due to back problems but when not in afib is very active around the house. Lat echo in December 2015 in [place], overall normal ef and left atrium.Intolerant to beta blockers.  Today, he denies symptoms of   chest pain,  orthopnea, PND, lower extremity edema, dizziness, presyncope, syncope, or neurologic sequela. Positive for shortness of breath with exertion.The patient is tolerating medications without difficulties and is otherwise without complaint today.   Past Medical History  Diagnosis Date  . Thyroid disease   . Hypertension   . Gout   . Femur fracture   . Atrial fibrillation   . Complication of anesthesia     BECAME COMBATIVE  . Shortness of breath     with exertion  . Arthritis   . History of gout   . Precancerous skin lesion   . GERD (gastroesophageal reflux disease)     occasional TUMS  . History of kidney  stones   . BPH (benign prostatic hypertrophy)   . Nocturia    Past Surgical History  Procedure Laterality Date  . Cervical spine surgery    . Fracture surgery      left arm  . Femur fracture surgery    . Ercp [name] 01/02/2013    Procedure: ENDOSCOPIC RETROGRADE CHOLANGIOPANCREATOGRAPHY (ERCP);  Surgeon: [name], MD;  Location: [name];  Service: Endoscopy;  Laterality: [name];  . Sphincterotomy [name] 01/02/2013    Procedure: SPHINCTEROTOMY;  Surgeon: [name], MD;  Location: [name];  Service: Endoscopy;  Laterality: [name];  Stone Extraction  . Cholecystectomy [name] 01/03/2013    Procedure: LAPAROSCOPIC CHOLECYSTECTOMY;  Surgeon: [name], MD;  Location: [name];  Service: General;  Laterality: [name];  . Liver biopsy [name] 01/03/2013    Procedure: LIVER BIOPSY;  Surgeon: [name], MD;  Location: [name];  Service: General;  Laterality: [name];  . Cataracts      EXCISION  . Total knee arthroplasty Right 05/30/2013    Procedure: RIGHT TOTAL KNEE ARTHROPLASTY;  Surgeon: [name], MD;  Location: [name];  Service: Orthopedics;  Laterality: Right;    Current Outpatient Prescriptions  Medication Sig Dispense Refill  . acetaminophen (TYLENOL) 500 MG tablet Take 500 mg by mouth every 6 (six) hours as needed for mild  pain or moderate pain.    [name] allopurinol (ZYLOPRIM) 300 MG tablet Take 300 mg by mouth every morning.     [name] apixaban (ELIQUIS) 5 MG TABS tablet Take 1 tablet (5 mg total) by mouth 2 (two) times daily. 60 tablet 6  . aspirin EC 81 MG tablet Take 81 mg by mouth every morning.     . clopidogrel (PLAVIX) 75 MG tablet Take 75 mg by mouth every evening.     . diclofenac (VOLTAREN) 75 MG EC tablet Take 75 mg by mouth daily.     [name] diltiazem (CARDIZEM CD) 120 MG 24 hr capsule Take 2 capsules (240 mg total) by mouth 2 (two) times daily. 120 capsule 6  . HYDROcodone-acetaminophen (NORCO) 7.5-325 MG per tablet Take 1-2 tablets by mouth every 4 (four) hours as needed for moderate pain.  (Patient taking differently: Take 1-2 tablets by mouth every 4 (four) hours as needed for moderate pain. For pain) 100 tablet 0  . lisinopril (PRINIVIL,ZESTRIL) 2.5 MG tablet Take 1 tablet (2.5 mg total) by mouth daily. 90 tablet 3  . SYNTHROID 150 MCG tablet TK 1 T PO QD  0  . tamsulosin (FLOMAX) 0.4 MG CAPS capsule Take 0.4 mg by mouth every morning.    . nitroGLYCERIN (NITROSTAT) 0.4 MG SL tablet Place 0.4 mg under the tongue every 5 (five) minutes as needed for chest pain.      No current facility-administered medications for this encounter.    No Known Allergies  History   Social History  . Marital Status: Married    Spouse Name: [name]  . Number of Children: [name]  . Years of Education: [name]   Occupational History  . Not on file.   Social History Main Topics  . Smoking status: Former Smoker    Quit date: 05/23/1973  . Smokeless tobacco: Not on file  . Alcohol Use: 0.0 oz/week    0 Standard drinks or equivalent per week     Comment: RARE  . Drug Use: No  . Sexual Activity: Not on file   Other Topics Concern  . Not on file   Social History Narrative    Family History  Problem Relation Age of Onset  . Kidney Stones Mother     ROS- All systems are reviewed and negative except as per the HPI above  Physical Exam: Filed Vitals:   12/01/14 1006  BP: 126/84  Pulse: 73  Height: 5\' 11"  (1.803 m)  Weight: 270 lb 6.4 oz (122.653 kg)    GEN- The patient is well appearing, alert and oriented x 3 today.   Head- normocephalic, atraumatic Eyes-  Sclera clear, conjunctiva pink Ears- hearing intact Oropharynx- clear Neck- supple, no JVP Lymph- no cervical lymphadenopathy Lungs- Clear to ausculation bilaterally, normal work of breathing Heart- Iregular rate and rhythm, no murmurs, rubs or gallops, PMI not laterally displaced GI- soft, NT, ND, + BS Extremities- no clubbing, cyanosis, or edema MS- no significant deformity or atrophy Skin- no rash or lesion Psych- euthymic  mood, full affect Neuro- strength and sensation are intact  EKG-Afib t 73 bpm, QRS int 92 ms, QTc 434 ms  Assessment and Plan:  1.Persistent symptomatic afib  Off amiodarone x several months with h/o hypothyroidism, but now hyperthyroid. TSH last checked one month ago and will recheck today. I am hestitate to restart amiodarone with thyroid issues. Not a candidate for 1c agents due to CAD That would leave sotalol or tikosyn Will repeat echo to check for  LV function, left atrial size, pt may be an ablation candidate.   2. Chadsvasc score of at least 3 Currently on triple therapy, higher risk of bleeding,   Diona Fanti currently continued due to high stent burden. Currently tolerating without bleeding issues  Check cbc, bmet today  3. CAD Stents x 4 placed last February Stable  4.  Snoring  Recent study showed only mild OSA  5. Obesity Wt loss encouraged  F/u will be determined re above tests and echo results  Butch Penny C. Alaa Mullally, [facility] [address] [place], [place] [address] [phone]  Will discuss with Dr. [name] if he thinks may be an ablation candidate.

## 2014-12-04 ENCOUNTER — Telehealth (HOSPITAL_COMMUNITY): Payer: Self-pay | Admitting: Nurse Practitioner

## 2014-12-04 ENCOUNTER — Other Ambulatory Visit (HOSPITAL_COMMUNITY): Payer: Self-pay | Admitting: Respiratory Therapy

## 2014-12-04 NOTE — Telephone Encounter (Signed)
Wayne Ortiz was called re echo and labs. Echo ok for lv function and left atrial size. I gave him option of ablation vrs tikosyn. I think we need to avoid amiodarone due to thyroid issues. TSH is improving but still overactive. He states he went to get the mail today and shortness of breath is worse. He wants something done soon. Ablation would be 1-2 months out. He was rate controlled in the office on Friday. I discussed tikosyn again and he would like to talk to Gay Filler to arrange. Staff message will be sent. QTc when in SR 395 ms.

## 2014-12-05 ENCOUNTER — Telehealth: Payer: Self-pay

## 2014-12-05 NOTE — Telephone Encounter (Signed)
Pt assistance [employer] will mail 90 day supply to pt of Eliquis,the next shipment will be mailed to our office,pt aware

## 2014-12-06 ENCOUNTER — Ambulatory Visit: Payer: Medicare HMO | Admitting: Adult Health

## 2014-12-07 ENCOUNTER — Encounter (HOSPITAL_COMMUNITY): Payer: Self-pay | Admitting: Nurse Practitioner

## 2014-12-07 ENCOUNTER — Inpatient Hospital Stay (HOSPITAL_COMMUNITY)
Admission: AD | Admit: 2014-12-07 | Discharge: 2014-12-09 | DRG: 310 | Disposition: A | Discharge status: Home / Self Care | Payer: Medicare HMO | Source: Ambulatory Visit | Attending: Cardiology | Admitting: Cardiology

## 2014-12-07 ENCOUNTER — Ambulatory Visit (INDEPENDENT_AMBULATORY_CARE_PROVIDER_SITE_OTHER): Payer: Medicare HMO | Admitting: Pharmacist

## 2014-12-07 DIAGNOSIS — I481 Persistent atrial fibrillation: Secondary | ICD-10-CM | POA: Diagnosis present

## 2014-12-07 DIAGNOSIS — Z6837 Body mass index (BMI) 37.0-37.9, adult: Secondary | ICD-10-CM | POA: Diagnosis not present

## 2014-12-07 DIAGNOSIS — Z87891 Personal history of nicotine dependence: Secondary | ICD-10-CM | POA: Diagnosis not present

## 2014-12-07 DIAGNOSIS — Z79891 Long term (current) use of opiate analgesic: Secondary | ICD-10-CM

## 2014-12-07 DIAGNOSIS — K219 Gastro-esophageal reflux disease without esophagitis: Secondary | ICD-10-CM | POA: Diagnosis present

## 2014-12-07 DIAGNOSIS — M109 Gout, unspecified: Secondary | ICD-10-CM | POA: Diagnosis present

## 2014-12-07 DIAGNOSIS — Z7902 Long term (current) use of antithrombotics/antiplatelets: Secondary | ICD-10-CM | POA: Diagnosis not present

## 2014-12-07 DIAGNOSIS — Z9861 Coronary angioplasty status: Secondary | ICD-10-CM

## 2014-12-07 DIAGNOSIS — E039 Hypothyroidism, unspecified: Secondary | ICD-10-CM | POA: Diagnosis present

## 2014-12-07 DIAGNOSIS — I480 Paroxysmal atrial fibrillation: Secondary | ICD-10-CM | POA: Diagnosis not present

## 2014-12-07 DIAGNOSIS — Z7901 Long term (current) use of anticoagulants: Secondary | ICD-10-CM | POA: Diagnosis not present

## 2014-12-07 DIAGNOSIS — R0600 Dyspnea, unspecified: Secondary | ICD-10-CM | POA: Diagnosis present

## 2014-12-07 DIAGNOSIS — I10 Essential (primary) hypertension: Secondary | ICD-10-CM

## 2014-12-07 DIAGNOSIS — N400 Enlarged prostate without lower urinary tract symptoms: Secondary | ICD-10-CM | POA: Diagnosis present

## 2014-12-07 DIAGNOSIS — I4819 Other persistent atrial fibrillation: Secondary | ICD-10-CM | POA: Diagnosis present

## 2014-12-07 DIAGNOSIS — I2510 Atherosclerotic heart disease of native coronary artery without angina pectoris: Secondary | ICD-10-CM | POA: Diagnosis present

## 2014-12-07 DIAGNOSIS — R0609 Other forms of dyspnea: Secondary | ICD-10-CM | POA: Diagnosis present

## 2014-12-07 HISTORY — DX: Other persistent atrial fibrillation: I48.19

## 2014-12-07 HISTORY — DX: Hypothyroidism, unspecified: E03.9

## 2014-12-07 HISTORY — DX: Cardiac arrhythmia, unspecified: I49.9

## 2014-12-07 HISTORY — DX: Atherosclerotic heart disease of native coronary artery without angina pectoris: I25.10

## 2014-12-07 LAB — BASIC METABOLIC PANEL
BUN: 22 mg/dL (ref 6–23)
CALCIUM: 9.4 mg/dL (ref 8.4–10.5)
CO2: 27 mEq/L (ref 19–32)
CREATININE: 1.12 mg/dL (ref 0.40–1.50)
Chloride: 105 mEq/L (ref 96–112)
GFR: 68.85 mL/min (ref >60.00)
Glucose, Bld: 99 mg/dL (ref 70–99)
Potassium: 4.3 mEq/L (ref 3.5–5.1)
Sodium: 139 mEq/L (ref 135–145)

## 2014-12-07 LAB — MAGNESIUM: Magnesium: 2 mg/dL (ref 1.5–2.5)

## 2014-12-07 MED ORDER — SOTALOL HCL 120 MG PO TABS
120.0000 mg | ORAL_TABLET | Freq: Two times a day (BID) | ORAL | Status: DC
  Administered 2014-12-07 – 2014-12-09 (×4): 120 mg via ORAL
  Filled 2014-12-07 (×5): qty 1

## 2014-12-07 MED ORDER — ONDANSETRON HCL 4 MG/2ML IJ SOLN: 4.0000 mg | Freq: Four times a day (QID) | INTRAMUSCULAR | Status: DC | PRN

## 2014-12-07 MED ORDER — NITROGLYCERIN 0.4 MG SL SUBL: 0.4000 mg | SUBLINGUAL_TABLET | SUBLINGUAL | Status: DC | PRN

## 2014-12-07 MED ORDER — ALLOPURINOL 300 MG PO TABS
300.0000 mg | ORAL_TABLET | Freq: Every morning | ORAL | Status: DC
  Administered 2014-12-08 – 2014-12-09 (×2): 300 mg via ORAL
  Filled 2014-12-07 (×2): qty 1

## 2014-12-07 MED ORDER — LISINOPRIL 2.5 MG PO TABS
2.5000 mg | ORAL_TABLET | Freq: Every day | ORAL | Status: DC
  Administered 2014-12-07 – 2014-12-09 (×3): 2.5 mg via ORAL
  Filled 2014-12-07 (×3): qty 1

## 2014-12-07 MED ORDER — LEVOTHYROXINE SODIUM 150 MCG PO TABS
150.0000 ug | ORAL_TABLET | Freq: Every day | ORAL | Status: DC
  Administered 2014-12-08 – 2014-12-09 (×2): 150 ug via ORAL
  Filled 2014-12-07 (×3): qty 1

## 2014-12-07 MED ORDER — HYDROCODONE-ACETAMINOPHEN 7.5-325 MG PO TABS: 1.0000 | ORAL_TABLET | ORAL | Status: DC | PRN

## 2014-12-07 MED ORDER — TAMSULOSIN HCL 0.4 MG PO CAPS
0.4000 mg | ORAL_CAPSULE | Freq: Every morning | ORAL | Status: DC
  Administered 2014-12-08 – 2014-12-09 (×2): 0.4 mg via ORAL
  Filled 2014-12-07 (×2): qty 1

## 2014-12-07 MED ORDER — DILTIAZEM HCL ER COATED BEADS 240 MG PO CP24
240.0000 mg | ORAL_CAPSULE | Freq: Two times a day (BID) | ORAL | Status: DC
  Administered 2014-12-07 – 2014-12-08 (×3): 240 mg via ORAL
  Filled 2014-12-07 (×6): qty 1

## 2014-12-07 MED ORDER — DICLOFENAC SODIUM 75 MG PO TBEC
75.0000 mg | DELAYED_RELEASE_TABLET | Freq: Every day | ORAL | Status: DC
  Administered 2014-12-08 – 2014-12-09 (×2): 75 mg via ORAL
  Filled 2014-12-07 (×2): qty 1

## 2014-12-07 MED ORDER — CLOPIDOGREL BISULFATE 75 MG PO TABS
75.0000 mg | ORAL_TABLET | Freq: Every evening | ORAL | Status: DC
  Administered 2014-12-07 – 2014-12-08 (×2): 75 mg via ORAL
  Filled 2014-12-07 (×3): qty 1

## 2014-12-07 MED ORDER — APIXABAN 5 MG PO TABS
5.0000 mg | ORAL_TABLET | Freq: Two times a day (BID) | ORAL | Status: DC
  Administered 2014-12-07 – 2014-12-09 (×4): 5 mg via ORAL
  Filled 2014-12-07 (×5): qty 1

## 2014-12-07 MED ORDER — ACETAMINOPHEN 325 MG PO TABS: 650.0000 mg | ORAL_TABLET | ORAL | Status: DC | PRN

## 2014-12-07 MED ORDER — PNEUMOCOCCAL VAC POLYVALENT 25 MCG/0.5ML IJ INJ
0.5000 mL | INJECTION | INTRAMUSCULAR | Status: AC
  Administered 2014-12-08: 0.5 mL via INTRAMUSCULAR
  Filled 2014-12-07: qty 0.5

## 2014-12-07 NOTE — H&P (Signed)
ELECTROPHYSIOLOGY HISTORY AND PHYSICAL    Patient ID: Wayne Ortiz MRN: [id number], DOB/AGE: 1945/11/15 69 y.o.  Admit date: 12/07/2014  Primary Physician: [name], MD Primary Cardiologist: Harl Bowie EPCurt Bears, new this admission  CC: here for Sotalol loading  HPI:  Wayne Ortiz is a 69 y.o. male with a past medical history significant for hypothyroidism, hypertension, persistent atrial fibrillation, and CAD s/p DES to LCx and RCA (05-2014).  He was first diagnosed with atrial fibrillation years ago.  He was previously maintained on amiodarone but was discontinued 2/2 side effects.  He reverted to AF but amiodarone was not felt to be best medicine to use 2/2 thyroid issues.  With CAD, Sotalol or Tikosyn were offered.  Echocardiogram demonstrated EF 55-60%, mild MR, LA 40.    He currently denies chest pain.  He has dyspnea on exertion and fatigue associated with his atrial fibrillation.  He denies recent fevers, chills, dizziness, nausea or vomiting.   Lab work was obtained with normal electrolytes and renal function.   Past Medical History  Diagnosis Date  . Thyroid disease   . Hypertension   . Gout   . Femur fracture   . Atrial fibrillation   . Complication of anesthesia     BECAME COMBATIVE  . Shortness of breath     with exertion  . Arthritis   . History of gout   . Precancerous skin lesion   . GERD (gastroesophageal reflux disease)     occasional TUMS  . History of kidney stones   . BPH (benign prostatic hypertrophy)   . Nocturia      Surgical History:  Past Surgical History  Procedure Laterality Date  . Cervical spine surgery    . Fracture surgery      left arm  . Femur fracture surgery    . Ercp [name] 01/02/2013    Procedure: ENDOSCOPIC RETROGRADE CHOLANGIOPANCREATOGRAPHY (ERCP);  Surgeon: [name], MD;  Location: [name];  Service: Endoscopy;  Laterality: [name];  . Sphincterotomy [name] 01/02/2013    Procedure: SPHINCTEROTOMY;  Surgeon: [name],  MD;  Location: [name];  Service: Endoscopy;  Laterality: [name];  Stone Extraction  . Cholecystectomy [name] 01/03/2013    Procedure: LAPAROSCOPIC CHOLECYSTECTOMY;  Surgeon: [name], MD;  Location: [name];  Service: General;  Laterality: [name];  . Liver biopsy [name] 01/03/2013    Procedure: LIVER BIOPSY;  Surgeon: [name], MD;  Location: [name];  Service: General;  Laterality: [name];  . Cataracts      EXCISION  . Total knee arthroplasty Right 05/30/2013    Procedure: RIGHT TOTAL KNEE ARTHROPLASTY;  Surgeon: [name], MD;  Location: [name];  Service: Orthopedics;  Laterality: Right;     Prescriptions prior to admission  Medication Sig Dispense Refill Last Dose  . allopurinol (ZYLOPRIM) 300 MG tablet Take 300 mg by mouth every morning.    12/07/2014 at Unknown time  . apixaban (ELIQUIS) 5 MG TABS tablet Take 1 tablet (5 mg total) by mouth 2 (two) times daily. 60 tablet 6 12/07/2014 at Unknown time  . clopidogrel (PLAVIX) 75 MG tablet Take 75 mg by mouth every evening.    12/06/2014 at Unknown time  . diclofenac (VOLTAREN) 75 MG EC tablet Take 75 mg by mouth daily.    12/07/2014 at Unknown time  . diltiazem (CARDIZEM CD) 120 MG 24 hr capsule Take 2 capsules (240 mg total) by mouth 2 (two) times daily. 120 capsule 6 12/07/2014  at Unknown time  . HYDROcodone-acetaminophen (NORCO) 7.5-325 MG per tablet Take 1-2 tablets by mouth every 4 (four) hours as needed for moderate pain. (Patient taking differently: Take 1-2 tablets by mouth every 4 (four) hours as needed for moderate pain. For pain) 100 tablet 0 3 weeks  . lisinopril (PRINIVIL,ZESTRIL) 2.5 MG tablet Take 1 tablet (2.5 mg total) by mouth daily. (Patient taking differently: Take 2.5 mg by mouth at bedtime. ) 90 tablet 3 12/06/2014 at Unknown time  . menthol-zinc oxide (GOLD BOND) powder Apply 1 application topically daily.   12/07/2014 at Unknown time  . mometasone (NASONEX) 50 MCG/ACT nasal spray Place 2 sprays into the nose daily.   2 weeks  .  nitroGLYCERIN (NITROSTAT) 0.4 MG SL tablet Place 0.4 mg under the tongue every 5 (five) minutes as needed for chest pain.    Never  . SYNTHROID 150 MCG tablet Take 150 mg daily  0 12/07/2014 at Unknown time  . tamsulosin (FLOMAX) 0.4 MG CAPS capsule Take 0.4 mg by mouth every morning.   12/07/2014 at Unknown time    Inpatient Medications:   Allergies: No Known Allergies  Social History   Social History  . Marital Status: Married    Spouse Name: [name]  . Number of Children: [name]  . Years of Education: [name]   Occupational History  . Not on file.   Social History Main Topics  . Smoking status: Former Smoker    Quit date: 05/23/1973  . Smokeless tobacco: Not on file  . Alcohol Use: 0.0 oz/week    0 Standard drinks or equivalent per week     Comment: RARE  . Drug Use: No  . Sexual Activity: Not on file   Other Topics Concern  . Not on file   Social History Narrative     Family History  Problem Relation Age of Onset  . Kidney Stones Mother      Review of Systems: All other systems reviewed and are otherwise negative except as noted above.  Physical Exam: Filed Vitals:   12/07/14 1431  BP: 132/83  Pulse: 76  Temp: 97.9 F (36.6 C)  TempSrc: Oral  Resp: 19  Height: 5' 11.5" (1.816 m)  Weight: 270 lb (122.471 kg)  SpO2: 93%    GEN- The patient is elderly and obese appearing, alert and oriented x 3 today.   HEENT: normocephalic, atraumatic; sclera clear, conjunctiva pink; hearing intact; oropharynx clear; neck supple  Lungs- Clear to ausculation bilaterally, normal work of breathing.  No wheezes, rales, rhonchi Heart- Irregular rate and rhythm  GI- soft, non-tender, non-distended, bowel sounds present Extremities- no clubbing, cyanosis, or edema; DP/PT/radial pulses 2+ bilaterally MS- no significant deformity or atrophy Skin- warm and dry, no rash or lesion Psych- euthymic mood, full affect Neuro- strength and sensation are intact  Labs:   Lab Results    Component Value Date   WBC 4.5 12/01/2014   HGB 16.1 12/01/2014   HCT 48.4 12/01/2014   MCV 98.4 12/01/2014   PLT 126* 12/01/2014    Recent Labs Lab 12/07/14 1029  NA 139  K 4.3  CL 105  CO2 27  BUN 22  CREATININE 1.12  CALCIUM 9.4  GLUCOSE 99   XVQ:MGQQPY fibrillation, rate 73, QTc 473  TELEMETRY: atrial fibrillation with controlled ventricular response  Assessment/Plan: 1.  Persistent atrial fibrillation The patient has persistent atrial fibrillation and has previously been on amiodarone. This was discontinued 2/2 maintaining SR.  He is symptomatic with recurrent AF  and alternate AAD options were discussed with the patient.  It was elected to proceed with Sotalol loading. Wayne Ortiz start Sotalol 120mg  bid Keep K>3.9, Mg >1.8 EKG daily to evaluate QTc Continue Eliquis for CHADS2VASC of at least 3 Obesity Wayne Ortiz limit our ability to maintain SR - weight loss encouraged today  2.  CAD No recent ischemic symptoms S/p DES 05/2014 Continue Plavix   3.  HTN Stable No change required today  4.  Obesity Weight loss encouraged   Signed, Chanetta Marshall, NP 12/07/2014 3:23 PM  I have seen and examined this patient with Chanetta Marshall.  Agree with above, note added to reflect my findings.  On exam, he is irregular with no murmurs, gallops or rubs.  His lungs are clear.  He has had AF for years and has felt poorly on amiodarone.  Wayne Ortiz try sotalol.    Wayne Ortiz M. Laasya Peyton MD 12/07/2014 4:05 PM

## 2014-12-07 NOTE — Progress Notes (Signed)
Patient ID: Wayne Ortiz, male   DOB: 10-Sep-1945, 69 y.o.   MRN: [id number]    Primary Care Physician: [name], MD Referring Physician:Dr. Carleton Vanvalkenburgh is a 69 y.o. male with a h/o CAD- recent DES to LCX and DES x2 to RCA at [facility] 05/2014, persistent afib, that had been on amiodarone until March 2016 at which time it was stopped due to pt, "staying in rhythm". Afib has been treated by Dr. Alroy Dust in [place] in the past. [name] has been rate controlling most recent bout of afib.  Pt states he is feeling better but adds he is terriblly  symptomatic when in afib with exertional dyspneic. He was referred to Afib clinic and given his symptoms, Roderic Palau, NP discussed rhythm control options with patient.  He is not a candidate for 1C agents due to CAD, he has thyroid issues so concern with restarting amiodarone.  Pt was given option of sotalol versus Tikosyn.  He checked with his insurance company and Tikosyn was a Tier 4 medication and sotalol a tier 2 medication.  He would prefer to start sotalol due to cost.    Pt is accompanied with his wife today.  We discussed potential side effects of sotalol including QTc prolongation.  He is aware of the importance of compiane and ptoential medication interactions and will contact the office with any changes.  Reviewed his current medication list.  His only previous antiarrhythmic medication was amiodarone and this was stopped > 3 months ago.  He is currently not taking any QTc prolongationg or contraindicated medications.  He is appropriately anticoagulated with Eliquis and has not missed any doses within the past 30 days.  He did mention he had gross hematuria x 3 occassions this week after mowing his yard.  This has resolved now.  Pt states he had some pain in his left lower abdomen but this resolved quickly.  He has a history of kidney stones and is not 100% that he did not just pass a stone.  He has no complaints at this time.  He is on  triple therapy now with Eliquis, ASA and Plavix.  He also takes diclofenac daily.  Suggested he try to cut down on the diclofenac to prn if possible as he is already at a high bleed risk.   EKG reviewed by Dr. Meda Coffee.  Afib with vent rate of 68 bpm.  QTc 406 msec.   Past Medical History  Diagnosis Date  . Thyroid disease   . Hypertension   . Gout   . Femur fracture   . Atrial fibrillation   . Complication of anesthesia     BECAME COMBATIVE  . Shortness of breath     with exertion  . Arthritis   . History of gout   . Precancerous skin lesion   . GERD (gastroesophageal reflux disease)     occasional TUMS  . History of kidney stones   . BPH (benign prostatic hypertrophy)   . Nocturia    Past Surgical History  Procedure Laterality Date  . Cervical spine surgery    . Fracture surgery      left arm  . Femur fracture surgery    . Ercp [name] 01/02/2013    Procedure: ENDOSCOPIC RETROGRADE CHOLANGIOPANCREATOGRAPHY (ERCP);  Surgeon: [name], MD;  Location: [name];  Service: Endoscopy;  Laterality: [name];  . Sphincterotomy [name] 01/02/2013    Procedure: SPHINCTEROTOMY;  Surgeon: [name], MD;  Location: [facility];  Service: Endoscopy;  Laterality: [name];  Stone Extraction  . Cholecystectomy [name] 01/03/2013    Procedure: LAPAROSCOPIC CHOLECYSTECTOMY;  Surgeon: [name], MD;  Location: [name];  Service: General;  Laterality: [name];  . Liver biopsy [name] 01/03/2013    Procedure: LIVER BIOPSY;  Surgeon: [name], MD;  Location: [name];  Service: General;  Laterality: [name];  . Cataracts      EXCISION  . Total knee arthroplasty Right 05/30/2013    Procedure: RIGHT TOTAL KNEE ARTHROPLASTY;  Surgeon: [name], MD;  Location: [name];  Service: Orthopedics;  Laterality: Right;    Current Outpatient Prescriptions  Medication Sig Dispense Refill  . acetaminophen (TYLENOL) 500 MG tablet Take 500 mg by mouth every 6 (six) hours as needed for mild pain or moderate pain.    [name] allopurinol  (ZYLOPRIM) 300 MG tablet Take 300 mg by mouth every morning.     [name] apixaban (ELIQUIS) 5 MG TABS tablet Take 1 tablet (5 mg total) by mouth 2 (two) times daily. 60 tablet 6  . aspirin EC 81 MG tablet Take 81 mg by mouth every morning.     . clopidogrel (PLAVIX) 75 MG tablet Take 75 mg by mouth every evening.     . diclofenac (VOLTAREN) 75 MG EC tablet Take 75 mg by mouth daily.     [name] diltiazem (CARDIZEM CD) 120 MG 24 hr capsule Take 2 capsules (240 mg total) by mouth 2 (two) times daily. 120 capsule 6  . HYDROcodone-acetaminophen (NORCO) 7.5-325 MG per tablet Take 1-2 tablets by mouth every 4 (four) hours as needed for moderate pain. (Patient taking differently: Take 1-2 tablets by mouth every 4 (four) hours as needed for moderate pain. For pain) 100 tablet 0  . lisinopril (PRINIVIL,ZESTRIL) 2.5 MG tablet Take 1 tablet (2.5 mg total) by mouth daily. 90 tablet 3  . nitroGLYCERIN (NITROSTAT) 0.4 MG SL tablet Place 0.4 mg under the tongue every 5 (five) minutes as needed for chest pain.     [name] SYNTHROID 150 MCG tablet TK 1 T PO QD  0  . tamsulosin (FLOMAX) 0.4 MG CAPS capsule Take 0.4 mg by mouth every morning.     No current facility-administered medications for this visit.    No Known Allergies   Assessment and Plan:  1.Persistent symptomatic afib  Pt's labs reviewed.  K- 4.3, Mg- 2.0.  Okay to start sotalol.  His Scr was 1.12.  CrCl > 144mL/min.  Okay to start sotalol 80mg  BID.  Pt aware to report to hospital onec bed available.

## 2014-12-07 NOTE — Progress Notes (Signed)
Patient directly admitted from the Dr.'s Office this afternoon with Afib. On arrival, alert and oriented, ambulatory. Assessments WDL. No skin problems. Placed on the cardiac monitor and peripheral iv inserted. Awaiting on the attending MD for further orders.

## 2014-12-08 ENCOUNTER — Telehealth: Payer: Self-pay

## 2014-12-08 DIAGNOSIS — I481 Persistent atrial fibrillation: Secondary | ICD-10-CM | POA: Diagnosis not present

## 2014-12-08 LAB — BASIC METABOLIC PANEL
ANION GAP: 8 (ref 5–15)
BUN: 19 mg/dL (ref 6–20)
CALCIUM: 9 mg/dL (ref 8.9–10.3)
CO2: 25 mmol/L (ref 22–32)
CREATININE: 1.14 mg/dL (ref 0.61–1.24)
Chloride: 105 mmol/L (ref 101–111)
GFR calc non Af Amer: >60 mL/min (ref >60)
Glucose, Bld: 104 mg/dL — ABNORMAL HIGH (ref 65–99)
Potassium: 4.4 mmol/L (ref 3.5–5.1)
Sodium: 138 mmol/L (ref 135–145)

## 2014-12-08 NOTE — Care Management Note (Signed)
Case Management Note Marvetta Gibbons RN, BSN Unit 2W-Case Manager [phone]  Patient Details  Name: Wayne Ortiz MRN: [id number] Date of Birth: 1946/01/31  Subjective/Objective:    Pt admitted with afib- for Sotalol loading                Action/Plan: PTA pt lived at home- anticipate return home- NCM to follow  Expected Discharge Date:                  Expected Discharge Plan:  Home/Self Care  In-House Referral:     Discharge planning Services  CM Consult  Post Acute Care Choice:    Choice offered to:     DME Arranged:    DME Agency:     HH Arranged:    [place] Agency:     Status of Service:  In process, will continue to follow  Medicare Important Message Given:    Date Medicare IM Given:    Medicare IM give by:    Date Additional Medicare IM Given:    Additional Medicare Important Message give by:     If discussed at [place] of Stay Meetings, dates discussed:    Additional Comments:  Dawayne Patricia, RN 12/08/2014, 10:49 AM

## 2014-12-08 NOTE — Telephone Encounter (Signed)
Called PT to give lab results, PT was not home- gave results to wife.

## 2014-12-08 NOTE — Progress Notes (Signed)
Utilization review completed.  

## 2014-12-08 NOTE — Discharge Instructions (Addendum)
Information on my medicine - ELIQUIS (apixaban)  Why was Eliquis prescribed for you? Eliquis was prescribed for you to reduce the risk of a blood clot forming that can cause a stroke if you have a medical condition called atrial fibrillation (a type of irregular heartbeat).  What do You need to know about Eliquis ? Take your Eliquis TWICE DAILY - one tablet in the morning and one tablet in the evening with or without food. If you have difficulty swallowing the tablet whole please discuss with your pharmacist how to take the medication safely.  Take Eliquis exactly as prescribed by your doctor and DO NOT stop taking Eliquis without talking to the doctor who prescribed the medication.  Stopping may increase your risk of developing a stroke.  Refill your prescription before you run out.  After discharge, you should have regular check-up appointments with your healthcare provider that is prescribing your Eliquis.  In the future your dose may need to be changed if your kidney function or weight changes by a significant amount or as you get older.  What do you do if you miss a dose? If you miss a dose, take it as soon as you remember on the same day and resume taking twice daily.  Do not take more than one dose of ELIQUIS at the same time to make up a missed dose.  Important Safety Information A possible side effect of Eliquis is bleeding. You should call your healthcare provider right away if you experience any of the following: ? Bleeding from an injury or your nose that does not stop. ? Unusual colored urine (red or dark brown) or unusual colored stools (red or black). ? Unusual bruising for unknown reasons. ? A serious fall or if you hit your head (even if there is no bleeding).  Some medicines may interact with Eliquis and might increase your risk of bleeding or clotting while on Eliquis. To help avoid this, consult your healthcare provider or pharmacist prior to using any new  prescription or non-prescription medications, including herbals, vitamins, non-steroidal anti-inflammatory drugs (NSAIDs) and supplements.  This website has more information on Eliquis (apixaban): http://www.eliquis.com/eliquis/home Atrial Fibrillation Atrial fibrillation is a condition that causes your heart to beat irregularly. It may also cause your heart to beat faster than normal. Atrial fibrillation can prevent your heart from pumping blood normally. It increases your risk of stroke and heart problems. HOME CARE  Take medications as told by your doctor.  Only take medications that your doctor says are safe. Some medications can make the condition worse or happen again.  If blood thinners were prescribed by your doctor, take them exactly as told. Too much can cause bleeding. Too little and you will not have the needed protection against stroke and other problems.  Perform blood tests at home if told by your doctor.  Perform blood tests exactly as told by your doctor.  Do not drink alcohol.  Do not drink beverages with caffeine such as coffee, soda, and some teas.  Maintain a healthy weight.  Do not use diet pills unless your doctor says they are safe. They may make heart problems worse.  Follow diet instructions as told by your doctor.  Exercise regularly as told by your doctor.  Keep all follow-up appointments. GET HELP IF:  You notice a change in the speed, rhythm, or strength of your heartbeat.  You suddenly begin peeing (urinating) more often.  You get tired more easily when moving or exercising. GET HELP  RIGHT AWAY IF:   You have chest or belly (abdominal) pain.  You feel sick to your stomach (nauseous).  You are short of breath.  You suddenly have swollen feet and ankles.  You feel dizzy.  You face, arms, or legs feel numb or weak.  There is a change in your vision or speech. MAKE SURE YOU:   Understand these instructions.  Will watch your  condition.  Will get help right away if you are not doing well or get worse. Document Released: 01/22/2008 Document Revised: 08/29/2013 Document Reviewed: 05/25/2012 [facility] Patient Information 2015 [place], [place]. This information is not intended to replace advice given to you by your health care provider. Make sure you discuss any questions you have with your health care provider.

## 2014-12-08 NOTE — Progress Notes (Signed)
    SUBJECTIVE: The patient is doing well today.  He remains short of breath with exertion.   CURRENT MEDICATIONS: . allopurinol  300 mg Oral q morning - 10a  . apixaban  5 mg Oral BID  . clopidogrel  75 mg Oral QPM  . diclofenac  75 mg Oral Daily  . diltiazem  240 mg Oral BID  . levothyroxine  150 mcg Oral QAC breakfast  . lisinopril  2.5 mg Oral Daily  . pneumococcal 23 valent vaccine  0.5 mL Intramuscular Tomorrow-1000  . sotalol  120 mg Oral Q12H  . tamsulosin  0.4 mg Oral q morning - 10a      OBJECTIVE: Physical Exam: Filed Vitals:   12/07/14 1431 12/07/14 2049 12/08/14 0513  BP: 132/83 132/88 107/80  Pulse: 76 102 69  Temp: 97.9 F (36.6 C) 97.9 F (36.6 C) 97.5 F (36.4 C)  TempSrc: Oral Oral Oral  Resp: 19 18 18   Height: 5' 11.5" (1.816 m)    Weight: 270 lb (122.471 kg)    SpO2: 93% 97% 99%    Intake/Output Summary (Last 24 hours) at 12/08/14 8916 Last data filed at 12/07/14 1833  Gross per 24 hour  Intake    240 ml  Output      0 ml  Net    240 ml    Telemetry reveals atrial fibrillation with rate controlled AF  GEN- The patient is well appearing, alert and oriented x 3 today.   Head- normocephalic, atraumatic Eyes-  Sclera clear, conjunctiva pink Ears- hearing intact Oropharynx- clear Neck- supple, no JVP Lymph- no cervical lymphadenopathy Lungs- Clear to ausculation bilaterally, normal work of breathing Heart- Regular rate and rhythm, no murmurs, rubs or gallops, PMI not laterally displaced GI- soft, NT, ND, + BS Extremities- no clubbing, cyanosis, or edema Skin- no rash or lesion Psych- euthymic mood, full affect Neuro- strength and sensation are intact  LABS: Basic Metabolic Panel:  Recent Labs  12/07/14 1029 12/08/14 0337  NA 139 138  K 4.3 4.4  CL 105 105  CO2 27 25  GLUCOSE 99 104*  BUN 22 19  CREATININE 1.12 1.14  CALCIUM 9.4 9.0  MG 2.0  --     ASSESSMENT AND PLAN:  Active Problems:   Persistent atrial  fibrillation  1. Persistent atrial fibrillation Continue Sotalol 120mg  bid - unable to uptitrate due to bradycardia Keep K>3.9, Mg >1.8 EKG daily to evaluate QTc - stable today Continue Eliquis for CHADS2VASC of at least 3 Jahaira Earnhart need DCCV tomorrow if not converted to SR - Serenity Fortner make NPO after midnight  2. CAD No recent ischemic symptoms S/p DES 05/2014 Continue Plavix   3. HTN Stable No change required today  4. Obesity Weight loss encouraged  Chanetta Marshall, NP 12/08/2014 10:26 AM   I have seen and examined this patient with Chanetta Marshall.  Agree with above, note added to reflect my findings.  On exam, irregular rhythm, lungs clear, no wheezing.  Is on sotalol without change in the QTc.  Josey Forcier continue sotalol.  If not convert, Kinser Fellman plan for cardioversion tomorrow.  Continue Eliquis.    Rondle Lohse M. Yuvan Medinger MD 12/08/2014 10:32 AM

## 2014-12-09 ENCOUNTER — Encounter (HOSPITAL_COMMUNITY)
Admission: AD | Disposition: A | Discharge status: Home / Self Care | Payer: Self-pay | Source: Ambulatory Visit | Attending: Cardiology

## 2014-12-09 ENCOUNTER — Inpatient Hospital Stay (HOSPITAL_COMMUNITY): Payer: Medicare HMO | Admitting: Certified Registered Nurse Anesthetist

## 2014-12-09 ENCOUNTER — Encounter (HOSPITAL_COMMUNITY): Payer: Self-pay | Admitting: Anesthesiology

## 2014-12-09 DIAGNOSIS — R0600 Dyspnea, unspecified: Secondary | ICD-10-CM

## 2014-12-09 DIAGNOSIS — E039 Hypothyroidism, unspecified: Secondary | ICD-10-CM | POA: Diagnosis present

## 2014-12-09 DIAGNOSIS — I2510 Atherosclerotic heart disease of native coronary artery without angina pectoris: Secondary | ICD-10-CM

## 2014-12-09 DIAGNOSIS — Z7901 Long term (current) use of anticoagulants: Secondary | ICD-10-CM

## 2014-12-09 DIAGNOSIS — I481 Persistent atrial fibrillation: Principal | ICD-10-CM

## 2014-12-09 DIAGNOSIS — Z9861 Coronary angioplasty status: Secondary | ICD-10-CM

## 2014-12-09 DIAGNOSIS — I10 Essential (primary) hypertension: Secondary | ICD-10-CM | POA: Diagnosis present

## 2014-12-09 DIAGNOSIS — R0609 Other forms of dyspnea: Secondary | ICD-10-CM | POA: Diagnosis present

## 2014-12-09 HISTORY — PX: CARDIOVERSION: SHX1299

## 2014-12-09 LAB — BASIC METABOLIC PANEL
Anion gap: 8 (ref 5–15)
BUN: 21 mg/dL — ABNORMAL HIGH (ref 6–20)
CO2: 24 mmol/L (ref 22–32)
Calcium: 8.8 mg/dL — ABNORMAL LOW (ref 8.9–10.3)
Chloride: 105 mmol/L (ref 101–111)
Creatinine, Ser: 1.22 mg/dL (ref 0.61–1.24)
GFR calc Af Amer: >60 mL/min (ref >60)
GFR calc non Af Amer: 58 mL/min — ABNORMAL LOW (ref >60)
Glucose, Bld: 95 mg/dL (ref 65–99)
POTASSIUM: 4.1 mmol/L (ref 3.5–5.1)
SODIUM: 137 mmol/L (ref 135–145)

## 2014-12-09 SURGERY — CARDIOVERSION
Anesthesia: Choice

## 2014-12-09 MED ORDER — PROPOFOL 10 MG/ML IV BOLUS
INTRAVENOUS | Status: DC | PRN
  Administered 2014-12-09: 70 mg via INTRAVENOUS

## 2014-12-09 MED ORDER — DILTIAZEM HCL ER COATED BEADS 120 MG PO CP24: 120.0000 mg | ORAL_CAPSULE | Freq: Two times a day (BID) | ORAL | Status: DC

## 2014-12-09 MED ORDER — LISINOPRIL 2.5 MG PO TABS: 2.5000 mg | ORAL_TABLET | Freq: Every day | ORAL | Status: DC

## 2014-12-09 MED ORDER — SOTALOL HCL 120 MG PO TABS: 120.0000 mg | ORAL_TABLET | Freq: Two times a day (BID) | ORAL | Status: DC

## 2014-12-09 MED ORDER — LIDOCAINE HCL (CARDIAC) 20 MG/ML IV SOLN
INTRAVENOUS | Status: DC | PRN
  Administered 2014-12-09: 20 mg via INTRAVENOUS

## 2014-12-09 MED ORDER — ACETAMINOPHEN 325 MG PO TABS: 650.0000 mg | ORAL_TABLET | ORAL | Status: DC | PRN

## 2014-12-09 MED ORDER — HYDROCODONE-ACETAMINOPHEN 7.5-325 MG PO TABS: 1.0000 | ORAL_TABLET | ORAL | Status: DC | PRN

## 2014-12-09 MED ORDER — DILTIAZEM HCL ER COATED BEADS 120 MG PO CP24
120.0000 mg | ORAL_CAPSULE | Freq: Two times a day (BID) | ORAL | Status: DC
  Filled 2014-12-09: qty 1

## 2014-12-09 NOTE — Progress Notes (Signed)
    Subjective:  Dyspneic secondary to AF  Objective:  Vital Signs in the last 24 hours: Temp:  [97.3 F (36.3 C)-98.2 F (36.8 C)] 97.7 F (36.5 C) (08/13 0556) Pulse Rate:  [53-55] 54 (08/13 0556) Resp:  [20-22] 20 (08/13 0556) BP: (99-116)/(58-81) 99/71 mmHg (08/13 0556) SpO2:  [95 %-99 %] 95 % (08/13 0556)  Intake/Output from previous day:  Intake/Output Summary (Last 24 hours) at 12/09/14 0840 Last data filed at 12/08/14 1700  Gross per 24 hour  Intake    960 ml  Output      0 ml  Net    960 ml    Physical Exam: General appearance: alert, cooperative, no distress and moderately obese Neck: no carotid bruit and no JVD Lungs: clear to auscultation bilaterally Heart: irregularly irregular rhythm Skin: cool, dry Neurologic: Grossly normal   Rate: 54  Rhythm: atrial fibrillation  Lab Results: No results for input(s): WBC, HGB, PLT in the last 72 hours.  Recent Labs  12/08/14 0337 12/09/14 0324  NA 138 137  K 4.4 4.1  CL 105 105  CO2 25 24  GLUCOSE 104* 95  BUN 19 21*  CREATININE 1.14 1.22   No results for input(s): TROPONINI in the last 72 hours.  Invalid input(s): CK, MB No results for input(s): INR in the last 72 hours.  Scheduled Meds: . allopurinol  300 mg Oral q morning - 10a  . apixaban  5 mg Oral BID  . clopidogrel  75 mg Oral QPM  . diclofenac  75 mg Oral Daily  . diltiazem  120 mg Oral BID  . levothyroxine  150 mcg Oral QAC breakfast  . lisinopril  2.5 mg Oral Daily  . sotalol  120 mg Oral Q12H  . tamsulosin  0.4 mg Oral q morning - 10a   Continuous Infusions:  PRN Meds:.acetaminophen, HYDROcodone-acetaminophen, nitroGLYCERIN, ondansetron (ZOFRAN) IV   Imaging: Imaging results have been reviewed   Assessment/Plan:   Principal Problem:   Dyspnea Active Problems:   Persistent atrial fibrillation   CAD S/P RCA and CFX DES Feb 2016   Chronic anticoagulation-Eliquis   Essential hypertension   Morbid obesity-BMI 37  Hypothyroidism   PLAN: Will try and arrange cardioversion.   Kerin Ransom PA-C 12/09/2014, 8:40 AM [phone]  Personally seen and examined. Agree with above.' Cardioversion successful Continue sotalol His son had several questions, hopefully his shortness of breath will improve now that he has restoration of normal rhythm.  Later today, he may be discharged. Follow-up with [name]. Candee Furbish, MD

## 2014-12-09 NOTE — CV Procedure (Signed)
    Electrical Cardioversion Procedure Note KYI ROMANELLO [id number] [birth year]  Procedure: Electrical Cardioversion Indications:  Atrial Fibrillation  Time Out: Verified patient identification, verified procedure,medications/allergies/relevent history reviewed, required imaging and test results available.  Performed  Procedure Details  The patient was NPO after midnight. Anesthesia was administered at the beside  by Dr. Linna Caprice with  propofol.  Cardioversion was performed with synchronized biphasic defibrillation via AP pads with 120 joules.  1 attempt(s) were performed.  The patient converted to normal sinus rhythm. The patient tolerated the procedure well   IMPRESSION:  Successful cardioversion of atrial fibrillation Continue sotalol His son had several questions, hopefully his shortness of breath will improve now that he has restoration of normal rhythm.  Later today, he may be discharged. Follow-up with [name].    SKAINS, [place] 12/09/2014, 11:40 AM

## 2014-12-09 NOTE — Discharge Summary (Signed)
Patient ID: Wayne Ortiz,  MRN: [id number], DOB/AGE: 1945-07-18 69 y.o.  Admit date: 12/07/2014 Discharge date: 12/09/2014  Primary Care Provider: [name], MD Primary Cardiologist: Dr Harl Bowie  Discharge Diagnoses Principal Problem:   Dyspnea Active Problems:   Persistent atrial fibrillation   CAD S/P RCA and CFX DES Feb 2016   Chronic anticoagulation-Eliquis   Essential hypertension   Morbid obesity-BMI 37   Hypothyroidism    Procedures: DCCV 12/09/14   Hospital Course:  69 y.o. male with a past medical history significant for hypothyroidism, hypertension, persistent atrial fibrillation, and CAD s/p DES to LCx and RCA (05-2014). He was first diagnosed with atrial fibrillation years ago. He was previously maintained on amiodarone but was discontinued 2/2 side effects. He reverted to AF but amiodarone was not felt to be best medicine to use 2/2 thyroid issues. With CAD, Sotalol or Tikosyn were offered. Echocardiogram demonstrated EF 55-60%, mild MR, LA 40. He has dyspnea on exertion and fatigue associated with his atrial fibrillation.  He was admitted 12/07/14 for initiation of Sotalol therapy and cardioversion. He has been compliant with chronic Eliquis. On 12/09/14 he underwent DCCV to NSR by Dr Marlou Porch. He was noted to be bradycardic before his cardioversion and his Diltiazem was decreased to 120 mg BID. He'll f/u with Dr Harl Bowie in 1-2- weeks as OP in [place].   Discharge Vitals:  Blood pressure 100/74, pulse 55, temperature 98.4 F (36.9 C), temperature source Oral, resp. rate 20, height 5' 11.5" (1.816 m), weight 270 lb (122.471 kg), SpO2 97 %.    Labs: Results for orders placed or performed during the hospital encounter of 12/07/14 (from the past 24 hour(s))  Basic metabolic panel     Status: Abnormal   Collection Time: 12/09/14  3:24 AM  Result Value Ref Range   Sodium 137 135 - 145 mmol/L   Potassium 4.1 3.5 - 5.1 mmol/L   Chloride 105 101 - 111 mmol/L     CO2 24 22 - 32 mmol/L   Glucose, Bld 95 65 - 99 mg/dL   BUN 21 (H) 6 - 20 mg/dL   Creatinine, Ser 1.22 0.61 - 1.24 mg/dL   Calcium 8.8 (L) 8.9 - 10.3 mg/dL   GFR calc non Af Amer 58 (L) >60 mL/min   GFR calc Af Amer >60 >60 mL/min   Anion gap 8 5 - 15    Disposition:  Follow-up Information    Follow up with Carlyle Dolly, MD.   Specialty:  Cardiology   Why:  office will contact you   Contact information:   [address] [place] [address] [phone]       Discharge Medications:    Medication List    TAKE these medications        acetaminophen 325 MG tablet  Commonly known as:  TYLENOL  Take 2 tablets (650 mg total) by mouth every 4 (four) hours as needed for headache or mild pain.     allopurinol 300 MG tablet  Commonly known as:  ZYLOPRIM  Take 300 mg by mouth every morning.     apixaban 5 MG Tabs tablet  Commonly known as:  ELIQUIS  Take 1 tablet (5 mg total) by mouth 2 (two) times daily.     clopidogrel 75 MG tablet  Commonly known as:  PLAVIX  Take 75 mg by mouth every evening.     diclofenac 75 MG EC tablet  Commonly known as:  VOLTAREN  Take 75 mg  by mouth daily.     diltiazem 120 MG 24 hr capsule  Commonly known as:  CARDIZEM CD  Take 1 capsule (120 mg total) by mouth 2 (two) times daily.     HYDROcodone-acetaminophen 7.5-325 MG per tablet  Commonly known as:  NORCO  Take 1-2 tablets by mouth every 4 (four) hours as needed for moderate pain. For pain     lisinopril 2.5 MG tablet  Commonly known as:  PRINIVIL,ZESTRIL  Take 1 tablet (2.5 mg total) by mouth at bedtime.     menthol-zinc oxide powder  Apply 1 application topically daily.     mometasone 50 MCG/ACT nasal spray  Commonly known as:  NASONEX  Place 2 sprays into the nose daily.     nitroGLYCERIN 0.4 MG SL tablet  Commonly known as:  NITROSTAT  Place 0.4 mg under the tongue every 5 (five) minutes as needed for chest pain.     sotalol 120 MG tablet  Commonly known as:   BETAPACE  Take 1 tablet (120 mg total) by mouth every 12 (twelve) hours.     SYNTHROID 150 MCG tablet  Generic drug:  levothyroxine  Take 150 mg daily     tamsulosin 0.4 MG Caps capsule  Commonly known as:  FLOMAX  Take 0.4 mg by mouth every morning.         Duration of Discharge Encounter: Greater than 30 minutes including physician time.  Signed, Kerin Ransom PA-C 12/09/2014 12:45 PM   Successful cardioversion Continue sotalol His son had several questions, hopefully his shortness of breath will improve now that he has restoration of normal rhythm.  Agree with discharged. Follow-up with [name].  Candee Furbish, MD

## 2014-12-09 NOTE — Transfer of Care (Signed)
Immediate Anesthesia Transfer of Care Note  Patient: Wayne Ortiz  Procedure(s) Performed: Procedure(s): CARDIOVERSION ([name])  Patient Location: PACU  Anesthesia Type:General  Level of Consciousness: awake, alert  and oriented  Airway & Oxygen Therapy: Patient Spontanous Breathing and Patient connected to nasal cannula oxygen  Post-op Assessment: Report given to RN and Post -op Vital signs reviewed and stable  Post vital signs: Reviewed and stable  Last Vitals:  Filed Vitals:   12/09/14 1121  BP:   Pulse:   Temp:   Resp: 14    Complications: No apparent anesthesia complications

## 2014-12-09 NOTE — Anesthesia Preprocedure Evaluation (Signed)
Anesthesia Evaluation  Patient identified by MRN, date of birth, ID band Patient awake    Reviewed: Allergy & Precautions, NPO status , Patient's Chart, lab work & pertinent test results  Airway Mallampati: III  TM Distance: >3 FB Neck ROM: Full    Dental  (+) Teeth Intact   Pulmonary former smoker,  breath sounds clear to auscultation        Cardiovascular hypertension, Rhythm:Irregular Rate:Normal     Neuro/Psych    GI/Hepatic   Endo/Other    Renal/GU      Musculoskeletal   Abdominal   Peds  Hematology   Anesthesia Other Findings   Reproductive/Obstetrics                             Anesthesia Physical Anesthesia Plan  ASA: III  Anesthesia Plan: General   Post-op Pain Management:    Induction: Intravenous  Airway Management Planned: Mask  Additional Equipment:   Intra-op Plan:   Post-operative Plan:   Informed Consent: I have reviewed the patients History and Physical, chart, labs and discussed the procedure including the risks, benefits and alternatives for the proposed anesthesia with the patient or authorized representative who has indicated his/her understanding and acceptance.     Plan Discussed with: CRNA and Anesthesiologist  Anesthesia Plan Comments:         Anesthesia Quick Evaluation

## 2014-12-09 NOTE — Anesthesia Postprocedure Evaluation (Signed)
  Anesthesia Post-op Note  Patient: Wayne Ortiz  Procedure(s) Performed: Procedure(s): CARDIOVERSION ([name])  Patient Location: Endoscopy Unit  Anesthesia Type:General  Level of Consciousness: awake, alert  and oriented  Airway and Oxygen Therapy: Patient Spontanous Breathing  Post-op Pain: none  Post-op Assessment: Post-op Vital signs reviewed, Patient's Cardiovascular Status Stable, Respiratory Function Stable, Patent Airway and Pain level controlled              Post-op Vital Signs: stable  Last Vitals:  Filed Vitals:   12/09/14 1418  BP: 110/71  Pulse: 54  Temp: 36.4 C  Resp: 19    Complications: No apparent anesthesia complications

## 2014-12-11 ENCOUNTER — Encounter (HOSPITAL_COMMUNITY): Payer: Self-pay | Admitting: Cardiology

## 2014-12-15 ENCOUNTER — Ambulatory Visit (HOSPITAL_COMMUNITY)
Admission: RE | Admit: 2014-12-15 | Discharge: 2014-12-15 | Disposition: A | Discharge status: Home / Self Care | Payer: Medicare HMO | Source: Ambulatory Visit | Attending: Nurse Practitioner | Admitting: Nurse Practitioner

## 2014-12-15 ENCOUNTER — Telehealth: Payer: Self-pay

## 2014-12-15 ENCOUNTER — Encounter (HOSPITAL_COMMUNITY): Payer: Self-pay | Admitting: Nurse Practitioner

## 2014-12-15 VITALS — BP 110/76 | HR 48 | Ht 71.0 in | Wt 271.0 lb

## 2014-12-15 DIAGNOSIS — K219 Gastro-esophageal reflux disease without esophagitis: Secondary | ICD-10-CM | POA: Insufficient documentation

## 2014-12-15 DIAGNOSIS — Z87891 Personal history of nicotine dependence: Secondary | ICD-10-CM | POA: Insufficient documentation

## 2014-12-15 DIAGNOSIS — I10 Essential (primary) hypertension: Secondary | ICD-10-CM | POA: Diagnosis not present

## 2014-12-15 DIAGNOSIS — I481 Persistent atrial fibrillation: Secondary | ICD-10-CM | POA: Diagnosis not present

## 2014-12-15 DIAGNOSIS — Z79899 Other long term (current) drug therapy: Secondary | ICD-10-CM | POA: Insufficient documentation

## 2014-12-15 DIAGNOSIS — I4891 Unspecified atrial fibrillation: Secondary | ICD-10-CM | POA: Diagnosis present

## 2014-12-15 DIAGNOSIS — E039 Hypothyroidism, unspecified: Secondary | ICD-10-CM | POA: Diagnosis not present

## 2014-12-15 DIAGNOSIS — I2510 Atherosclerotic heart disease of native coronary artery without angina pectoris: Secondary | ICD-10-CM | POA: Diagnosis not present

## 2014-12-15 DIAGNOSIS — Z7902 Long term (current) use of antithrombotics/antiplatelets: Secondary | ICD-10-CM | POA: Diagnosis not present

## 2014-12-15 DIAGNOSIS — I4819 Other persistent atrial fibrillation: Secondary | ICD-10-CM

## 2014-12-15 NOTE — Progress Notes (Signed)
Patient ID: Wayne Ortiz, male   DOB: 1945-06-04, 69 y.o.   MRN: [id number]     Primary Care Physician: [name], MD Referring Physician: [facility] f/u   Wayne Ortiz is a 69 y.o. male with a h/o persistent afib that is f/u in the afib clinic for recent hospitalization for sotatol  load. He was very symptomatic in afib and has failed amiodarone in the past.  He is in SR but with a slow v response. He feels much better in sinus rhythm but has felt tired since home. HR's at home have been consistently in the 40's. He does not feel presyncopal with this. He says he rather have the bradycardia than the afib. He stopped all of his cardizem x one day, withoutt much improvement in HR.   Today, he denies symptoms of palpitations, chest pain, shortness of breath, orthopnea, PND, lower extremity edema, dizziness, presyncope, syncope, or neurologic sequela. The patient is tolerating medications without difficulties and is otherwise without complaint today.   Past Medical History  Diagnosis Date  . Hypothyroidism   . Hypertension   . Gout   . Femur fracture   . Persistent atrial fibrillation   . Complication of anesthesia     BECAME COMBATIVE  . Arthritis   . History of gout   . Precancerous skin lesion   . GERD (gastroesophageal reflux disease)     occasional TUMS  . History of kidney stones   . BPH (benign prostatic hypertrophy)   . CAD (coronary artery disease)     a. s/p DES 05/2014 at [facility]  . Dysrhythmia   . Atrial fibrillation    Past Surgical History  Procedure Laterality Date  . Cervical spine surgery    . Fracture surgery      left arm  . Femur fracture surgery    . Ercp [name] 01/02/2013    Procedure: ENDOSCOPIC RETROGRADE CHOLANGIOPANCREATOGRAPHY (ERCP);  Surgeon: [name], MD;  Location: [name];  Service: Endoscopy;  Laterality: [name];  . Sphincterotomy [name] 01/02/2013    Procedure: SPHINCTEROTOMY;  Surgeon: [name], MD;  Location: [name];  Service: Endoscopy;   Laterality: [name];  Stone Extraction  . Cholecystectomy [name] 01/03/2013    Procedure: LAPAROSCOPIC CHOLECYSTECTOMY;  Surgeon: [name], MD;  Location: [name];  Service: General;  Laterality: [name];  . Liver biopsy [name] 01/03/2013    Procedure: LIVER BIOPSY;  Surgeon: [name], MD;  Location: [name];  Service: General;  Laterality: [name];  . Cataracts      EXCISION  . Total knee arthroplasty Right 05/30/2013    Procedure: RIGHT TOTAL KNEE ARTHROPLASTY;  Surgeon: [name], MD;  Location: [name];  Service: Orthopedics;  Laterality: Right;  . Cardioversion [name] 12/09/2014    Procedure: CARDIOVERSION;  Surgeon: [name], MD;  Location: [facility];  Service: Cardiovascular;  Laterality: [name];    Current Outpatient Prescriptions  Medication Sig Dispense Refill  . acetaminophen (TYLENOL) 325 MG tablet Take 2 tablets (650 mg total) by mouth every 4 (four) hours as needed for headache or mild pain.    [name] allopurinol (ZYLOPRIM) 300 MG tablet Take 300 mg by mouth every morning.     [name] apixaban (ELIQUIS) 5 MG TABS tablet Take 1 tablet (5 mg total) by mouth 2 (two) times daily. 60 tablet 6  . clopidogrel (PLAVIX) 75 MG tablet Take 75 mg by mouth every evening.     . diclofenac (VOLTAREN) 75 MG EC tablet  Take 75 mg by mouth daily.     [name] lisinopril (PRINIVIL,ZESTRIL) 2.5 MG tablet Take 1 tablet (2.5 mg total) by mouth at bedtime. 90 tablet 3  . menthol-zinc oxide (GOLD BOND) powder Apply 1 application topically daily.    . sotalol (BETAPACE) 120 MG tablet Take 1 tablet (120 mg total) by mouth every 12 (twelve) hours. 60 tablet 11  . SYNTHROID 150 MCG tablet Take 150 mg daily  0  . tamsulosin (FLOMAX) 0.4 MG CAPS capsule Take 0.4 mg by mouth every morning.    . diltiazem (CARDIZEM CD) 120 MG 24 hr capsule Take 1 capsule (120 mg total) by mouth 2 (two) times daily. (Patient not taking: Reported on 12/15/2014) 60 capsule 11  . HYDROcodone-acetaminophen (NORCO) 7.5-325 MG per tablet Take 1-2 tablets by mouth  every 4 (four) hours as needed for moderate pain. For pain (Patient not taking: Reported on 12/15/2014) 100 tablet 0  . mometasone (NASONEX) 50 MCG/ACT nasal spray Place 2 sprays into the nose daily.    . nitroGLYCERIN (NITROSTAT) 0.4 MG SL tablet Place 0.4 mg under the tongue every 5 (five) minutes as needed for chest pain.      No current facility-administered medications for this encounter.    No Known Allergies  Social History   Social History  . Marital Status: Married    Spouse Name: [name]  . Number of Children: [name]  . Years of Education: [name]   Occupational History  . Not on file.   Social History Main Topics  . Smoking status: Former Smoker    Quit date: 05/23/1973  . Smokeless tobacco: Never Used  . Alcohol Use: 0.0 oz/week    0 Standard drinks or equivalent per week     Comment: RARE  . Drug Use: No  . Sexual Activity: Not on file   Other Topics Concern  . Not on file   Social History Narrative    Family History  Problem Relation Age of Onset  . Kidney Stones Mother     ROS- All systems are reviewed and negative except as per the HPI above  Physical Exam: Filed Vitals:   12/15/14 0932  BP: 110/76  Pulse: 43  Height: 5\' 11"  (1.803 m)  Weight: 271 lb (122.925 kg)    GEN- The patient is well appearing, alert and oriented x 3 today.   Head- normocephalic, atraumatic Eyes-  Sclera clear, conjunctiva pink Ears- hearing intact Oropharynx- clear Neck- supple, no JVP Lymph- no cervical lymphadenopathy Lungs- Clear to ausculation bilaterally, normal work of breathing Heart- Regular slow rate and rhythm, no murmurs, rubs or gallops, PMI not laterally displaced GI- soft, NT, ND, + BS Extremities- no clubbing, cyanosis, or edema MS- no significant deformity or atrophy Skin- no rash or lesion Psych- euthymic mood, full affect Neuro- strength and sensation are intact  EKG-Marked sinus bradycardia at 43 bpm with PAC's. PR int 178 ms, QRS 94 ms, QTc  415 ms,    Epic records reviewed   Assessment and Plan: 1. Persistent afib  Maintaining SR with slow ventricular response or recent addition of sotalol Stop cardizem Discussed with Dr. [name]  who suggests to sit tight for now on current dose of sotalol if pt is tolerating Pt aware if becomes presyncopal to let office know  2. HTN Stable  F/u in 10 days

## 2014-12-15 NOTE — Telephone Encounter (Signed)
I left message on VM at [facility] sleep center asking them to call us regarding sleep study results @[phone]  [phone]

## 2014-12-15 NOTE — Telephone Encounter (Signed)
-----   Message from Arnoldo Lenis, MD sent at 12/15/2014  9:47 AM EDT ----- I found the report, it was written as a progress note as opposed to procedure result. Please let patient know it showed only mild sleep apnea, he does not require a CPAP machine  Zandra Abts MD ----- Message -----    From: Arnoldo Lenis, MD    Sent: 11/28/2014  10:42 AM      To: Arnoldo Lenis, MD  F/u sleep study results

## 2014-12-15 NOTE — Patient Instructions (Signed)
Your physician has recommended you make the following change in your medication: 1)stop cardizem  Parking code for September 0090

## 2014-12-15 NOTE — Telephone Encounter (Signed)
Wife notified of results

## 2014-12-15 NOTE — Telephone Encounter (Signed)
VM left with Patty Creegle

## 2014-12-28 ENCOUNTER — Ambulatory Visit (HOSPITAL_COMMUNITY)
Admission: RE | Admit: 2014-12-28 | Discharge: 2014-12-28 | Disposition: A | Discharge status: Home / Self Care | Payer: Medicare HMO | Source: Ambulatory Visit | Attending: Nurse Practitioner | Admitting: Nurse Practitioner

## 2014-12-28 ENCOUNTER — Other Ambulatory Visit: Payer: Self-pay

## 2014-12-28 ENCOUNTER — Encounter (HOSPITAL_COMMUNITY): Payer: Self-pay | Admitting: Nurse Practitioner

## 2014-12-28 VITALS — BP 130/86 | HR 46 | Ht 71.0 in | Wt 272.2 lb

## 2014-12-28 DIAGNOSIS — I481 Persistent atrial fibrillation: Secondary | ICD-10-CM | POA: Insufficient documentation

## 2014-12-28 DIAGNOSIS — I10 Essential (primary) hypertension: Secondary | ICD-10-CM | POA: Insufficient documentation

## 2014-12-28 DIAGNOSIS — I4819 Other persistent atrial fibrillation: Secondary | ICD-10-CM

## 2014-12-28 NOTE — Progress Notes (Signed)
Patient ID: Wayne Ortiz, male   DOB: 05-18-1945, 69 y.o.   MRN: [id number]     Primary Care Physician: [name], MD Referring Physician: [facility] F/U   Wayne Ortiz is a 69 y.o. male with a h/o persistent afib that is f/u in the afib clinic for recent hospitalization for sotatol  load. He was very symptomatic in afib and has failed amiodarone in the past.  He is in SR but with a slow v response. He feels much better in sinus rhythm but has felt tired since being home. HR's at home have been consistently in the 40's. He does not feel presyncopal with this. He says he rather have the bradycardia than the afib. He is off Cardizem since sotalol load.  Today, he denies symptoms of palpitations, chest pain,  orthopnea, PND, lower extremity edema, dizziness, presyncope, syncope, or neurologic sequela.  Has some shortness of breath with exertion with the bradycardia as well as mild fatigue.The patient is tolerating medications without difficulties and is otherwise without complaint today.   Past Medical History  Diagnosis Date  . Hypothyroidism   . Hypertension   . Gout   . Femur fracture   . Persistent atrial fibrillation   . Complication of anesthesia     BECAME COMBATIVE  . Arthritis   . History of gout   . Precancerous skin lesion   . GERD (gastroesophageal reflux disease)     occasional TUMS  . History of kidney stones   . BPH (benign prostatic hypertrophy)   . CAD (coronary artery disease)     a. s/p DES 05/2014 at [facility]  . Dysrhythmia   . Atrial fibrillation    Past Surgical History  Procedure Laterality Date  . Cervical spine surgery    . Fracture surgery      left arm  . Femur fracture surgery    . Ercp [name] 01/02/2013    Procedure: ENDOSCOPIC RETROGRADE CHOLANGIOPANCREATOGRAPHY (ERCP);  Surgeon: [name], MD;  Location: [name];  Service: Endoscopy;  Laterality: [name];  . Sphincterotomy [name] 01/02/2013    Procedure: SPHINCTEROTOMY;  Surgeon: [name], MD;  Location:  [name];  Service: Endoscopy;  Laterality: [name];  Stone Extraction  . Cholecystectomy [name] 01/03/2013    Procedure: LAPAROSCOPIC CHOLECYSTECTOMY;  Surgeon: [name], MD;  Location: [name];  Service: General;  Laterality: [name];  . Liver biopsy [name] 01/03/2013    Procedure: LIVER BIOPSY;  Surgeon: [name], MD;  Location: [name];  Service: General;  Laterality: [name];  . Cataracts      EXCISION  . Total knee arthroplasty Right 05/30/2013    Procedure: RIGHT TOTAL KNEE ARTHROPLASTY;  Surgeon: [name], MD;  Location: [name];  Service: Orthopedics;  Laterality: Right;  . Cardioversion [name] 12/09/2014    Procedure: CARDIOVERSION;  Surgeon: [name], MD;  Location: [facility] OR;  Service: Cardiovascular;  Laterality: [name];    Current Outpatient Prescriptions  Medication Sig Dispense Refill  . acetaminophen (TYLENOL) 325 MG tablet Take 2 tablets (650 mg total) by mouth every 4 (four) hours as needed for headache or mild pain.    [name] allopurinol (ZYLOPRIM) 300 MG tablet Take 300 mg by mouth every morning.     [name] apixaban (ELIQUIS) 5 MG TABS tablet Take 1 tablet (5 mg total) by mouth 2 (two) times daily. 60 tablet 6  . clopidogrel (PLAVIX) 75 MG tablet Take 75 mg by mouth every evening.     [name]  diclofenac (VOLTAREN) 75 MG EC tablet Take 75 mg by mouth daily.     [name] HYDROcodone-acetaminophen (NORCO) 7.5-325 MG per tablet Take 1-2 tablets by mouth every 4 (four) hours as needed for moderate pain. For pain 100 tablet 0  . lisinopril (PRINIVIL,ZESTRIL) 2.5 MG tablet Take 1 tablet (2.5 mg total) by mouth at bedtime. 90 tablet 3  . menthol-zinc oxide (GOLD BOND) powder Apply 1 application topically daily.    . mometasone (NASONEX) 50 MCG/ACT nasal spray Place 2 sprays into the nose daily.    . sotalol (BETAPACE) 120 MG tablet Take 1 tablet (120 mg total) by mouth every 12 (twelve) hours. 60 tablet 11  . SYNTHROID 150 MCG tablet Take 150 mg daily  0  . tamsulosin (FLOMAX) 0.4 MG CAPS capsule Take 0.4 mg by mouth  every morning.    . nitroGLYCERIN (NITROSTAT) 0.4 MG SL tablet Place 0.4 mg under the tongue every 5 (five) minutes as needed for chest pain.      No current facility-administered medications for this encounter.    No Known Allergies  Social History   Social History  . Marital Status: Married    Spouse Name: [name]  . Number of Children: [name]  . Years of Education: [name]   Occupational History  . Not on file.   Social History Main Topics  . Smoking status: Former Smoker    Quit date: 05/23/1973  . Smokeless tobacco: Never Used  . Alcohol Use: 0.0 oz/week    0 Standard drinks or equivalent per week     Comment: RARE  . Drug Use: No  . Sexual Activity: Not on file   Other Topics Concern  . Not on file   Social History Narrative    Family History  Problem Relation Age of Onset  . Kidney Stones Mother     ROS- All systems are reviewed and negative except as per the HPI above  Physical Exam: Filed Vitals:   12/28/14 1012  BP: 130/86  Pulse: 46  Height: 5\' 11"  (1.803 m)  Weight: 272 lb 3.2 oz (123.469 kg)    GEN- The patient is well appearing, alert and oriented x 3 today.   Head- normocephalic, atraumatic Eyes-  Sclera clear, conjunctiva pink Ears- hearing intact Oropharynx- clear Neck- supple, no JVP Lymph- no cervical lymphadenopathy Lungs- Clear to ausculation bilaterally, normal work of breathing Heart- Regular slow rate and rhythm, no murmurs, rubs or gallops, PMI not laterally displaced GI- soft, NT, ND, + BS Extremities- no clubbing, cyanosis, or edema MS- no significant deformity or atrophy Skin- no rash or lesion Psych- euthymic mood, full affect Neuro- strength and sensation are intact  EKG-Marked sinus bradycardia at 46, Pr int 128 ms, QRS 90 ms, QTc 401 ms. Epic records reviewed   Assessment and Plan: 1. Persistent afib  Maintaining SR with slow ventricular response with recent addition of sotalol Off cardizem Discussed with Dr. [name]  who  suggests that pt should be considered for an ablation since mildly symptomatic with bradycardia with sotalol Pt aware if becomes presyncopal to let office know  2. HTN Stable  F/u with Dr. Franne Forts. Allred as scheduled.   Geroge Baseman Lovelee Forner, [facility] [address] [place], [place] [address] [phone]

## 2015-01-04 ENCOUNTER — Ambulatory Visit: Payer: Medicare HMO | Admitting: Cardiology

## 2015-01-17 ENCOUNTER — Encounter: Payer: Self-pay | Admitting: *Deleted

## 2015-01-22 ENCOUNTER — Encounter: Payer: Self-pay | Admitting: Internal Medicine

## 2015-01-22 ENCOUNTER — Ambulatory Visit (INDEPENDENT_AMBULATORY_CARE_PROVIDER_SITE_OTHER): Payer: Medicare HMO | Admitting: Internal Medicine

## 2015-01-22 VITALS — BP 118/84 | HR 85 | Ht 71.0 in | Wt 275.1 lb

## 2015-01-22 DIAGNOSIS — Z9861 Coronary angioplasty status: Secondary | ICD-10-CM

## 2015-01-22 DIAGNOSIS — I10 Essential (primary) hypertension: Secondary | ICD-10-CM | POA: Diagnosis not present

## 2015-01-22 DIAGNOSIS — I481 Persistent atrial fibrillation: Secondary | ICD-10-CM | POA: Diagnosis not present

## 2015-01-22 DIAGNOSIS — I2510 Atherosclerotic heart disease of native coronary artery without angina pectoris: Secondary | ICD-10-CM | POA: Diagnosis not present

## 2015-01-22 DIAGNOSIS — I4819 Other persistent atrial fibrillation: Secondary | ICD-10-CM

## 2015-01-22 NOTE — Patient Instructions (Signed)
Medication Instructions: - no changes  Labwork: - pending procedure date  Procedures/Testing: - Your physician has recommended that you have an A-Fib ablation. Catheter ablation is a medical procedure used to treat some cardiac arrhythmias (irregular heartbeats). During catheter ablation, a long, thin, flexible tube is put into a blood vessel in your groin (upper thigh), or neck. This tube is called an ablation catheter. It is then guided to your heart through the blood vessel. Radio frequency waves destroy small areas of heart tissue where abnormal heartbeats may cause an arrhythmia to start. Please see the instruction sheet given to you today.  Follow-Up: - pending procedure date  Any Additional Special Instructions Will Be Listed Below (If Applicable). - none

## 2015-01-23 NOTE — Progress Notes (Signed)
Electrophysiology Office Note   Date:  01/23/2015   ID:  Wayne Ortiz, DOB March 06, 1946, MRN [id number]  PCP:  [name], MD  Cardiologist:  Dr Marlou Porch Primary Electrophysiologist: [name], MD    Chief Complaint  Patient presents with  . Persistent AFIB     History of Present Illness: Wayne Ortiz is a 69 y.o. male who presents today for electrophysiology evaluation.   He has h/o CAD- recent DES to LCX and DES x2 to RCA at [facility] 2/2016and persistent afib.  He previously failed medical therapy with amiodarone.  He was subsequently treated with a rate control strategy by Dr Harl Bowie but reports not feeling well.  He has significant SOB during afib. He recently had a sleep study and showed mild sleep apnea, cpap not recommended, per report in the computer.  He has had DCCV x2 in the remote past. Apixaban has been added for chadsvasc score of at least 3 but now is on eliquis.   Has obesity, cannot exercise due to back problems but when not in afib is very active around the house.  He was recently hospitalized and started on sotalol.  He did well initially but has since had recurrent atrial fibrillation.  He did have symptomatic sinus bradycardia with HR 40s on sotalol.  Today, he denies symptoms of palpitations, chest pain, orthopnea, PND, lower extremity edema, claudication, dizziness, presyncope, syncope, bleeding, or neurologic sequela. The patient is tolerating medications without difficulties and is otherwise without complaint today.    Past Medical History  Diagnosis Date  . Hypothyroidism   . Hypertension   . Gout   . Femur fracture   . Persistent atrial fibrillation   . Complication of anesthesia     BECAME COMBATIVE  . Arthritis   . History of gout   . Precancerous skin lesion   . GERD (gastroesophageal reflux disease)     occasional TUMS  . History of kidney stones   . BPH (benign prostatic hypertrophy)   . CAD (coronary artery disease)     a. s/p DES 05/2014 at  [facility]  . Dysrhythmia   . Atrial fibrillation    Past Surgical History  Procedure Laterality Date  . Cervical spine surgery    . Arm fracture surgery Left   . Femur fracture surgery    . Ercp [name] 01/02/2013    Procedure: ENDOSCOPIC RETROGRADE CHOLANGIOPANCREATOGRAPHY (ERCP);  Surgeon: [name], MD;  Location: [name];  Service: Endoscopy;  Laterality: [name];  . Sphincterotomy [name] 01/02/2013    Procedure: SPHINCTEROTOMY;  Surgeon: [name], MD;  Location: [name];  Service: Endoscopy;  Laterality: [name];  Stone Extraction  . Cholecystectomy [name] 01/03/2013    Procedure: LAPAROSCOPIC CHOLECYSTECTOMY;  Surgeon: [name], MD;  Location: [name];  Service: General;  Laterality: [name];  . Liver biopsy [name] 01/03/2013    Procedure: LIVER BIOPSY;  Surgeon: [name], MD;  Location: [name];  Service: General;  Laterality: [name];  . Cataracts      EXCISION  . Total knee arthroplasty Right 05/30/2013    Procedure: RIGHT TOTAL KNEE ARTHROPLASTY;  Surgeon: [name], MD;  Location: [name];  Service: Orthopedics;  Laterality: Right;  . Cardioversion [name] 12/09/2014    Procedure: CARDIOVERSION;  Surgeon: [name], MD;  Location: [facility] OR;  Service: Cardiovascular;  Laterality: [name];     Current Outpatient Prescriptions  Medication Sig Dispense Refill  . allopurinol (ZYLOPRIM) 300 MG tablet Take 300 mg  by mouth every morning.     [name] apixaban (ELIQUIS) 5 MG TABS tablet Take 1 tablet (5 mg total) by mouth 2 (two) times daily. 60 tablet 6  . diclofenac (VOLTAREN) 75 MG EC tablet Take 75 mg by mouth daily.     [name] HYDROcodone-acetaminophen (NORCO) 7.5-325 MG per tablet Take 1-2 tablets by mouth every 4 (four) hours as needed for moderate pain. For pain 100 tablet 0  . lisinopril (PRINIVIL,ZESTRIL) 2.5 MG tablet Take 1 tablet (2.5 mg total) by mouth at bedtime. 90 tablet 3  . menthol-zinc oxide (GOLD BOND) powder Apply 1 application topically daily.    . mometasone (NASONEX) 50 MCG/ACT nasal spray  Place 2 sprays into the nose daily.    . nitroGLYCERIN (NITROSTAT) 0.4 MG SL tablet Place 0.4 mg under the tongue every 5 (five) minutes x 3 doses as needed for chest pain.     . sotalol (BETAPACE) 120 MG tablet Take 1 tablet (120 mg total) by mouth every 12 (twelve) hours. 60 tablet 11  . SYNTHROID 150 MCG tablet Take 150 mg by mouth daily  0  . tamsulosin (FLOMAX) 0.4 MG CAPS capsule Take 0.4 mg by mouth every morning.    [name] acetaminophen (TYLENOL) 325 MG tablet Take 2 tablets (650 mg total) by mouth every 4 (four) hours as needed for headache or mild pain. (Patient not taking: Reported on 01/22/2015)     No current facility-administered medications for this visit.    Allergies:   Review of patient's allergies indicates no known allergies.   Social History:  The patient  reports that he quit smoking about 41 years ago. He has never used smokeless tobacco. He reports that he drinks alcohol. He reports that he does not use illicit drugs.   Family History:  The patient's  family history includes Kidney Stones in his mother.    ROS:  Please see the history of present illness.   All other systems are reviewed and negative.    PHYSICAL EXAM: VS:  BP 118/84 mmHg  Pulse 85  Ht 5\' 11"  (1.803 m)  Wt 275 lb 1.9 oz (124.794 kg)  BMI 38.39 kg/m2 , BMI Body mass index is 38.39 kg/(m^2). GEN: overweight, in no acute distress HEENT: normal Neck: no JVD, carotid bruits, or masses Cardiac: iRRR; no murmurs, rubs, or gallops,no edema  Respiratory:  clear to auscultation bilaterally, normal work of breathing GI: soft, nontender, nondistended, + BS MS: no deformity or atrophy Skin: warm and dry  Neuro:  Strength and sensation are intact Psych: euthymic mood, full affect  EKG:  EKG is ordered today. The ekg ordered today shows afib, V rate 85 bpm, Qtc 483, nonspecific ST/T changes   Recent Labs: 08/08/2014: ALT 30 09/11/2014: B Natriuretic Peptide 178.0* 12/01/2014: Hemoglobin 16.1; Platelets 126*;  TSH 0.314* 12/07/2014: Magnesium 2.0 12/09/2014: BUN 21*; Creatinine, Ser 1.22; Potassium 4.1; Sodium 137    Lipid Panel     Component Value Date/Time   CHOL 164 08/08/2014 0711   TRIG 104 08/08/2014 0711   HDL 41 08/08/2014 0711   CHOLHDL 4.0 08/08/2014 0711   VLDL 21 08/08/2014 0711   LDLCALC 102* 08/08/2014 0711     Wt Readings from Last 3 Encounters:  01/22/15 275 lb 1.9 oz (124.794 kg)  12/28/14 272 lb 3.2 oz (123.469 kg)  12/15/14 271 lb (122.925 kg)      Other studies Reviewed: Additional studies/ records that were reviewed today include:AF clinic notes, echo 12/01/14  Review of the  above records today demonstrates: preserved EF, mild MR, mildly dilated LA   ASSESSMENT AND PLAN:  1.  Persistent atrial fibrillation The patient has symptomatic afib.  He reports symptoms suggestive of vagal triggers as precipitants for his afib.  He has failed medical therapy with sotalol and amiodarone.  His chads2vasc score is at least 3.  He is anticoagulated with eliquis. Therapeutic strategies for afib including medicine and ablation were discussed in detail with the patient today. Risk, benefits, and alternatives to EP study and radiofrequency ablation for afib were also discussed in detail today. These risks include but are not limited to stroke, bleeding, vascular damage, tamponade, perforation, damage to the esophagus, lungs, and other structures, pulmonary vein stenosis, worsening renal function, and death. The patient understands these risk and wishes to proceed.  We will therefore proceed with catheter ablation at the next available time.  2. Morbid obesity I have reviewed the patients BMI and decreased success rates with ablation at length today.  Weight loss is strongly advised.  Per Guijian et al (PACE 2013; 36: 664-403), patients with BMI 25-29.9 (obese) have a 27% increase in AF recurrence post ablation.  Patients with BMI >30 have a 31% increase in AF recurrence post ablation  when compared to those with BMI <25.  3. CAD No ischemic symptoms  4. HTN Stable No change required today  Current medicines are reviewed at length with the patient today.   The patient does not have concerns regarding his medicines.  The following changes were made today:  none  Signed, [name], MD  01/23/2015 9:42 PM     [address] [place] [address] [phone] (office) [phone] (fax)

## 2015-01-26 NOTE — Addendum Note (Signed)
Addended by: Freada Bergeron on: 01/26/2015 05:51 PM   Modules accepted: Orders

## 2015-02-08 ENCOUNTER — Other Ambulatory Visit: Payer: Self-pay | Admitting: *Deleted

## 2015-02-08 ENCOUNTER — Encounter: Payer: Self-pay | Admitting: *Deleted

## 2015-02-08 DIAGNOSIS — I480 Paroxysmal atrial fibrillation: Secondary | ICD-10-CM

## 2015-02-15 ENCOUNTER — Telehealth: Payer: Self-pay | Admitting: Cardiology

## 2015-02-15 NOTE — Telephone Encounter (Signed)
Patient wants to speak with nurse regarding renewing his patient assistance for Eliquis. / tg

## 2015-02-15 NOTE — Telephone Encounter (Signed)
Pt called back, he questioned whether we would send him a new eliquis assistance form or would the company send him one. He is aware that he is covered through Dec.31, 2016 and that he will have to provide [place] with up top date info as far as income, out of pocket medication expense. He states that is fine and he will call the assistance company to see when they are going to send his renewal form. He states that he will be in touch, and once he gets the renewal form he will complete it and send it in to [place] with his up to date information.

## 2015-02-15 NOTE — Telephone Encounter (Signed)
Called pt back, he had stepped out for a few minutes. His wife will have him call me back.

## 2015-03-08 ENCOUNTER — Telehealth: Payer: Self-pay | Admitting: Internal Medicine

## 2015-03-08 ENCOUNTER — Other Ambulatory Visit (INDEPENDENT_AMBULATORY_CARE_PROVIDER_SITE_OTHER): Payer: Medicare HMO | Admitting: *Deleted

## 2015-03-08 DIAGNOSIS — I10 Essential (primary) hypertension: Secondary | ICD-10-CM

## 2015-03-08 LAB — CBC WITH DIFFERENTIAL/PLATELET
BASOS PCT: 0 % (ref 0–1)
Basophils Absolute: 0 10*3/uL (ref 0.0–0.1)
EOS PCT: 3 % (ref 0–5)
Eosinophils Absolute: 0.1 10*3/uL (ref 0.0–0.7)
HCT: 47.6 % (ref 39.0–52.0)
HEMOGLOBIN: 16.7 g/dL (ref 13.0–17.0)
Lymphocytes Relative: 27 % (ref 12–46)
Lymphs Abs: 1.3 10*3/uL (ref 0.7–4.0)
MCH: 34.1 pg — AB (ref 26.0–34.0)
MCHC: 35.1 g/dL (ref 30.0–36.0)
MCV: 97.1 fL (ref 78.0–100.0)
MONO ABS: 0.5 10*3/uL (ref 0.1–1.0)
MONOS PCT: 11 % (ref 3–12)
MPV: 9.8 fL (ref 8.6–12.4)
Neutro Abs: 2.9 10*3/uL (ref 1.7–7.7)
Neutrophils Relative %: 59 % (ref 43–77)
Platelets: 152 10*3/uL (ref 150–400)
RBC: 4.9 MIL/uL (ref 4.22–5.81)
RDW: 14.6 % (ref 11.5–15.5)
WBC: 4.9 10*3/uL (ref 4.0–10.5)

## 2015-03-08 LAB — BASIC METABOLIC PANEL
BUN: 17 mg/dL (ref 7–25)
CHLORIDE: 106 mmol/L (ref 98–110)
CO2: 23 mmol/L (ref 20–31)
CREATININE: 1.08 mg/dL (ref 0.70–1.18)
Calcium: 9.7 mg/dL (ref 8.6–10.3)
Glucose, Bld: 87 mg/dL (ref 65–99)
Potassium: 4.4 mmol/L (ref 3.5–5.3)
Sodium: 138 mmol/L (ref 135–146)

## 2015-03-08 NOTE — Telephone Encounter (Signed)
Spoke with patient and answered all his questions in regards to his procedure

## 2015-03-08 NOTE — Addendum Note (Signed)
Addended by: Eulis Foster on: 03/08/2015 09:49 AM   Modules accepted: Orders

## 2015-03-08 NOTE — Telephone Encounter (Signed)
New Message  Pt calling to speak w/ RN concerning upcoming ablation- 11/17. Please call back and discuss.

## 2015-03-09 ENCOUNTER — Telehealth: Payer: Self-pay | Admitting: Internal Medicine

## 2015-03-09 NOTE — Telephone Encounter (Signed)
Calling with questions regarding his procedure on 11/17.  Wanted to know what a TEE involved.  Explained procedure and verified time and place where he needs to report. Verbalizes understanding.  All questions addressed.

## 2015-03-09 NOTE — Telephone Encounter (Signed)
New Message  Pt has additional questions concerning ablation on 11/17. Please call back and discuss.

## 2015-03-15 ENCOUNTER — Ambulatory Visit (HOSPITAL_BASED_OUTPATIENT_CLINIC_OR_DEPARTMENT_OTHER): Payer: Medicare HMO

## 2015-03-15 ENCOUNTER — Ambulatory Visit (HOSPITAL_COMMUNITY)
Admission: RE | Admit: 2015-03-15 | Discharge: 2015-03-16 | Disposition: A | Discharge status: Home / Self Care | Payer: Medicare HMO | Source: Ambulatory Visit | Attending: Internal Medicine | Admitting: Internal Medicine

## 2015-03-15 ENCOUNTER — Encounter (HOSPITAL_COMMUNITY)
Admission: RE | Disposition: A | Discharge status: Home / Self Care | Payer: Self-pay | Source: Ambulatory Visit | Attending: Internal Medicine

## 2015-03-15 ENCOUNTER — Ambulatory Visit (HOSPITAL_COMMUNITY): Payer: Medicare HMO | Admitting: Anesthesiology

## 2015-03-15 ENCOUNTER — Encounter (HOSPITAL_COMMUNITY): Payer: Self-pay | Admitting: Certified Registered"

## 2015-03-15 DIAGNOSIS — M109 Gout, unspecified: Secondary | ICD-10-CM | POA: Insufficient documentation

## 2015-03-15 DIAGNOSIS — I4891 Unspecified atrial fibrillation: Secondary | ICD-10-CM | POA: Diagnosis not present

## 2015-03-15 DIAGNOSIS — Z79899 Other long term (current) drug therapy: Secondary | ICD-10-CM | POA: Insufficient documentation

## 2015-03-15 DIAGNOSIS — K219 Gastro-esophageal reflux disease without esophagitis: Secondary | ICD-10-CM | POA: Diagnosis not present

## 2015-03-15 DIAGNOSIS — Z96651 Presence of right artificial knee joint: Secondary | ICD-10-CM | POA: Diagnosis not present

## 2015-03-15 DIAGNOSIS — Z79891 Long term (current) use of opiate analgesic: Secondary | ICD-10-CM | POA: Insufficient documentation

## 2015-03-15 DIAGNOSIS — I2510 Atherosclerotic heart disease of native coronary artery without angina pectoris: Secondary | ICD-10-CM | POA: Insufficient documentation

## 2015-03-15 DIAGNOSIS — Z6838 Body mass index (BMI) 38.0-38.9, adult: Secondary | ICD-10-CM | POA: Insufficient documentation

## 2015-03-15 DIAGNOSIS — Z7901 Long term (current) use of anticoagulants: Secondary | ICD-10-CM | POA: Insufficient documentation

## 2015-03-15 DIAGNOSIS — I480 Paroxysmal atrial fibrillation: Secondary | ICD-10-CM

## 2015-03-15 DIAGNOSIS — Z955 Presence of coronary angioplasty implant and graft: Secondary | ICD-10-CM | POA: Diagnosis not present

## 2015-03-15 DIAGNOSIS — I10 Essential (primary) hypertension: Secondary | ICD-10-CM | POA: Insufficient documentation

## 2015-03-15 DIAGNOSIS — I481 Persistent atrial fibrillation: Secondary | ICD-10-CM | POA: Diagnosis not present

## 2015-03-15 DIAGNOSIS — Z791 Long term (current) use of non-steroidal anti-inflammatories (NSAID): Secondary | ICD-10-CM | POA: Diagnosis not present

## 2015-03-15 DIAGNOSIS — I4819 Other persistent atrial fibrillation: Secondary | ICD-10-CM | POA: Diagnosis present

## 2015-03-15 DIAGNOSIS — Z87891 Personal history of nicotine dependence: Secondary | ICD-10-CM | POA: Insufficient documentation

## 2015-03-15 DIAGNOSIS — E039 Hypothyroidism, unspecified: Secondary | ICD-10-CM | POA: Diagnosis not present

## 2015-03-15 DIAGNOSIS — N400 Enlarged prostate without lower urinary tract symptoms: Secondary | ICD-10-CM | POA: Diagnosis not present

## 2015-03-15 HISTORY — PX: ELECTROPHYSIOLOGIC STUDY: SHX172A

## 2015-03-15 LAB — POCT ACTIVATED CLOTTING TIME
ACTIVATED CLOTTING TIME: 276 s
ACTIVATED CLOTTING TIME: 306 s
Activated Clotting Time: 288 seconds
Activated Clotting Time: 331 seconds
Activated Clotting Time: 146 seconds

## 2015-03-15 LAB — MRSA PCR SCREENING: MRSA by PCR: POSITIVE — AB

## 2015-03-15 SURGERY — ECHOCARDIOGRAM, TRANSESOPHAGEAL
Anesthesia: Moderate Sedation

## 2015-03-15 SURGERY — ATRIAL FIBRILLATION ABLATION
Anesthesia: General

## 2015-03-15 MED ORDER — HYDROCODONE-ACETAMINOPHEN 7.5-325 MG PO TABS: 1.0000 | ORAL_TABLET | ORAL | Status: DC | PRN

## 2015-03-15 MED ORDER — PROPOFOL 10 MG/ML IV BOLUS
INTRAVENOUS | Status: DC | PRN
  Administered 2015-03-15: 200 mg via INTRAVENOUS

## 2015-03-15 MED ORDER — HYDROCODONE-ACETAMINOPHEN 5-325 MG PO TABS
1.0000 | ORAL_TABLET | ORAL | Status: DC | PRN
  Administered 2015-03-15 – 2015-03-16 (×4): 2 via ORAL
  Filled 2015-03-15 (×3): qty 2

## 2015-03-15 MED ORDER — LEVOTHYROXINE SODIUM 150 MCG PO TABS
150.0000 ug | ORAL_TABLET | Freq: Every day | ORAL | Status: DC
  Administered 2015-03-16: 150 ug via ORAL
  Filled 2015-03-15: qty 1

## 2015-03-15 MED ORDER — MUPIROCIN 2 % EX OINT
1.0000 "application " | TOPICAL_OINTMENT | Freq: Two times a day (BID) | CUTANEOUS | Status: DC
  Administered 2015-03-15 – 2015-03-16 (×2): 1 via NASAL
  Filled 2015-03-15: qty 22

## 2015-03-15 MED ORDER — OFF THE BEAT BOOK
Freq: Once | Status: AC
  Administered 2015-03-15: 22:00:00
  Filled 2015-03-15: qty 1

## 2015-03-15 MED ORDER — APIXABAN 5 MG PO TABS
5.0000 mg | ORAL_TABLET | Freq: Two times a day (BID) | ORAL | Status: DC
  Administered 2015-03-15 – 2015-03-16 (×2): 5 mg via ORAL
  Filled 2015-03-15 (×2): qty 1

## 2015-03-15 MED ORDER — PROTAMINE SULFATE 10 MG/ML IV SOLN
INTRAVENOUS | Status: DC | PRN
  Administered 2015-03-15: 20 mg via INTRAVENOUS
  Administered 2015-03-15: 10 mg via INTRAVENOUS

## 2015-03-15 MED ORDER — HEPARIN SODIUM (PORCINE) 1000 UNIT/ML IJ SOLN
INTRAMUSCULAR | Status: DC | PRN
  Administered 2015-03-15: 12000 [IU] via INTRAVENOUS
  Administered 2015-03-15 (×2): 1000 [IU] via INTRAVENOUS

## 2015-03-15 MED ORDER — ALLOPURINOL 300 MG PO TABS
300.0000 mg | ORAL_TABLET | Freq: Every morning | ORAL | Status: DC
  Administered 2015-03-15 – 2015-03-16 (×2): 300 mg via ORAL
  Filled 2015-03-15 (×2): qty 1

## 2015-03-15 MED ORDER — HEPARIN SODIUM (PORCINE) 1000 UNIT/ML IJ SOLN
INTRAMUSCULAR | Status: AC
  Filled 2015-03-15: qty 1

## 2015-03-15 MED ORDER — LIDOCAINE HCL (CARDIAC) 20 MG/ML IV SOLN
INTRAVENOUS | Status: DC | PRN
  Administered 2015-03-15: 60 mg via INTRAVENOUS

## 2015-03-15 MED ORDER — PHENYLEPHRINE HCL 10 MG/ML IJ SOLN
INTRAMUSCULAR | Status: DC | PRN
  Administered 2015-03-15: 120 ug via INTRAVENOUS
  Administered 2015-03-15 (×3): 80 ug via INTRAVENOUS

## 2015-03-15 MED ORDER — SODIUM CHLORIDE 0.9 % IV SOLN
INTRAVENOUS | Status: DC
  Administered 2015-03-15 (×2): via INTRAVENOUS

## 2015-03-15 MED ORDER — TAMSULOSIN HCL 0.4 MG PO CAPS
0.4000 mg | ORAL_CAPSULE | Freq: Every morning | ORAL | Status: DC
  Administered 2015-03-15 – 2015-03-16 (×2): 0.4 mg via ORAL
  Filled 2015-03-15 (×2): qty 1

## 2015-03-15 MED ORDER — FENTANYL CITRATE (PF) 250 MCG/5ML IJ SOLN
INTRAMUSCULAR | Status: DC | PRN
  Administered 2015-03-15: 100 ug via INTRAVENOUS

## 2015-03-15 MED ORDER — DEXTROSE 5 % IV SOLN
10.0000 mg | INTRAVENOUS | Status: DC | PRN
  Administered 2015-03-15: 50 ug/min via INTRAVENOUS
  Administered 2015-03-15: 11:00:00 via INTRAVENOUS

## 2015-03-15 MED ORDER — OXYCODONE-ACETAMINOPHEN 5-325 MG PO TABS
ORAL_TABLET | ORAL | Status: AC
  Filled 2015-03-15: qty 2

## 2015-03-15 MED ORDER — HEPARIN SODIUM (PORCINE) 1000 UNIT/ML IJ SOLN
INTRAMUSCULAR | Status: DC | PRN
  Administered 2015-03-15: 12000 [IU] via INTRAVENOUS
  Administered 2015-03-15: 4000 [IU] via INTRAVENOUS
  Administered 2015-03-15: 1000 [IU] via INTRAVENOUS
  Administered 2015-03-15: 3000 [IU] via INTRAVENOUS

## 2015-03-15 MED ORDER — ONDANSETRON HCL 4 MG/2ML IJ SOLN: 4.0000 mg | Freq: Four times a day (QID) | INTRAMUSCULAR | Status: DC | PRN

## 2015-03-15 MED ORDER — SUCCINYLCHOLINE CHLORIDE 20 MG/ML IJ SOLN
INTRAMUSCULAR | Status: DC | PRN
  Administered 2015-03-15: 100 mg via INTRAVENOUS

## 2015-03-15 MED ORDER — BUPIVACAINE HCL (PF) 0.25 % IJ SOLN
INTRAMUSCULAR | Status: DC | PRN
  Administered 2015-03-15: 20 mL

## 2015-03-15 MED ORDER — ONDANSETRON HCL 4 MG/2ML IJ SOLN
INTRAMUSCULAR | Status: DC | PRN
  Administered 2015-03-15: 4 mg via INTRAVENOUS

## 2015-03-15 MED ORDER — ACETAMINOPHEN 325 MG PO TABS: 650.0000 mg | ORAL_TABLET | ORAL | Status: DC | PRN

## 2015-03-15 MED ORDER — SODIUM CHLORIDE 0.9 % IJ SOLN: 3.0000 mL | INTRAMUSCULAR | Status: DC | PRN

## 2015-03-15 MED ORDER — SODIUM CHLORIDE 0.9 % IV SOLN: 250.0000 mL | INTRAVENOUS | Status: DC | PRN

## 2015-03-15 MED ORDER — SODIUM CHLORIDE 0.9 % IJ SOLN
3.0000 mL | Freq: Two times a day (BID) | INTRAMUSCULAR | Status: DC
  Administered 2015-03-15: 3 mL via INTRAVENOUS

## 2015-03-15 MED ORDER — IOHEXOL 350 MG/ML SOLN
INTRAVENOUS | Status: DC | PRN
  Administered 2015-03-15: 2 mL via INTRAVENOUS
  Administered 2015-03-15: 100 mL via INTRAVENOUS

## 2015-03-15 MED ORDER — BUPIVACAINE HCL (PF) 0.25 % IJ SOLN
INTRAMUSCULAR | Status: AC
  Filled 2015-03-15: qty 30

## 2015-03-15 MED ORDER — CHLORHEXIDINE GLUCONATE CLOTH 2 % EX PADS
6.0000 | MEDICATED_PAD | Freq: Every day | CUTANEOUS | Status: DC
  Administered 2015-03-16: 6 via TOPICAL

## 2015-03-15 SURGICAL SUPPLY — 22 items
BAG SNAP BAND KOVER 36X36 (MISCELLANEOUS) ×2 IMPLANT
BLANKET WARM UNDERBOD FULL ACC (MISCELLANEOUS) ×2 IMPLANT
CATH DIAG 6FR PIGTAIL (CATHETERS) ×2 IMPLANT
CATH NAVISTAR SMARTTOUCH DF (ABLATOR) ×2 IMPLANT
CATH SOUNDSTAR ECO REPROCESSED (CATHETERS) ×1 IMPLANT
CATH VARIABLE LASSO NAV 2515 (CATHETERS) ×1 IMPLANT
CATH WEBSTER BI DIR CS D-F CRV (CATHETERS) ×2 IMPLANT
COVER SWIFTLINK CONNECTOR (BAG) ×2 IMPLANT
NDL TRANSEP BRK 71CM 407200 (NEEDLE) ×1 IMPLANT
NEEDLE TRANSEP BRK 71CM 407200 (NEEDLE) ×2 IMPLANT
PACK EP LATEX FREE (CUSTOM PROCEDURE TRAY) ×2
PACK EP LF (CUSTOM PROCEDURE TRAY) ×1 IMPLANT
PAD DEFIB LIFELINK (PAD) ×2 IMPLANT
PATCH CARTO3 (PAD) ×2 IMPLANT
SHEATH AVANTI 11F 11CM (SHEATH) ×2 IMPLANT
SHEATH PINNACLE 7F 10CM (SHEATH) ×3 IMPLANT
SHEATH PINNACLE 9F 10CM (SHEATH) ×2 IMPLANT
SHEATH SWARTZ TS SL2 63CM 8.5F (SHEATH) ×2 IMPLANT
SHIELD RADPAD SCOOP 12X17 (MISCELLANEOUS) ×2 IMPLANT
SYR MEDRAD MARK V 150ML (SYRINGE) ×2 IMPLANT
TUBING CONTRAST HIGH PRESS 48 (TUBING) ×2 IMPLANT
TUBING SMART ABLATE COOLFLOW (TUBING) ×2 IMPLANT

## 2015-03-15 NOTE — Transfer of Care (Signed)
Immediate Anesthesia Transfer of Care Note  Patient: Wayne Ortiz  Procedure(s) Performed: Procedure(s): Atrial Fibrillation Ablation ([name])  Patient Location: Cath Lab  Anesthesia Type:General  Level of Consciousness: awake, alert , oriented and patient cooperative  Airway & Oxygen Therapy: Patient Spontanous Breathing and Patient connected to face mask oxygen  Post-op Assessment: Report given to RN, Post -op Vital signs reviewed and stable and Patient moving all extremities  Post vital signs: Reviewed and stable  Last Vitals:  Filed Vitals:   03/15/15 0538  BP: 122/72  Pulse: 88  Temp: 36.7 C  Resp: 18    Complications: No apparent anesthesia complications

## 2015-03-15 NOTE — Anesthesia Postprocedure Evaluation (Signed)
  Anesthesia Post-op Note  Patient: Wayne Ortiz  Procedure(s) Performed: Procedure(s): Atrial Fibrillation Ablation ([name])  Patient Location: PACU  Anesthesia Type:General  Level of Consciousness: awake and alert   Airway and Oxygen Therapy: Patient Spontanous Breathing  Post-op Pain: none  Post-op Assessment: Post-op Vital signs reviewed              Post-op Vital Signs: Reviewed  Last Vitals:  Filed Vitals:   03/15/15 1320  BP:   Pulse: 64  Temp:   Resp: 16    Complications: No apparent anesthesia complications

## 2015-03-15 NOTE — CV Procedure (Signed)
     TEE  Indications - AFIB, pre ablation  Findings  EF 30-35% Mild MR Mild TR Trace PI No LAA thrombus, no other thrombus.  Tolerated well. Timeout performed. No complications or blood loss. ETT in place.   Candee Furbish, MD

## 2015-03-15 NOTE — Progress Notes (Signed)
  Echocardiogram 2D Echocardiogram has been performed.  Wayne Ortiz 03/15/2015, 9:04 AM

## 2015-03-15 NOTE — Anesthesia Preprocedure Evaluation (Addendum)
Anesthesia Evaluation  Patient identified by MRN, date of birth, ID band Patient awake    Reviewed: Allergy & Precautions, NPO status , Patient's Chart, lab work & pertinent test results, reviewed documented beta blocker date and time   Airway Mallampati: II  TM Distance: >3 FB Neck ROM: Full    Dental no notable dental hx.    Pulmonary shortness of breath, former smoker,    Pulmonary exam normal breath sounds clear to auscultation       Cardiovascular hypertension, Pt. on medications and Pt. on home beta blockers + CAD and + Cardiac Stents  Normal cardiovascular exam+ dysrhythmias Atrial Fibrillation  Rhythm:Regular Rate:Normal     Neuro/Psych negative neurological ROS     GI/Hepatic Neg liver ROS, GERD  ,  Endo/Other  Hypothyroidism Morbid obesity  Renal/GU negative Renal ROS     Musculoskeletal  (+) Arthritis ,   Abdominal (+) + obese,   Peds  Hematology negative hematology ROS (+)   Anesthesia Other Findings   Reproductive/Obstetrics                          Anesthesia Physical Anesthesia Plan  ASA: III  Anesthesia Plan: General   Post-op Pain Management:    Induction: Intravenous  Airway Management Planned: Oral ETT  Additional Equipment: None  Intra-op Plan:   Post-operative Plan: Extubation in OR  Informed Consent: I have reviewed the patients History and Physical, chart, labs and discussed the procedure including the risks, benefits and alternatives for the proposed anesthesia with the patient or authorized representative who has indicated his/her understanding and acceptance.   Dental advisory given  Plan Discussed with: CRNA and Surgeon  Anesthesia Plan Comments:       Anesthesia Quick Evaluation

## 2015-03-15 NOTE — Progress Notes (Signed)
Site area: right groin a 7, 9, 11 french venous sheath was removed  Site Prior to Removal:  Level 0  Pressure Applied For 15 MINUTES    Minutes Beginning at 1300  Manual:   Yes.    Patient Status During Pull:  stable  Post Pull Groin Site:  Level 0  Post Pull Instructions Given:  Yes.    Post Pull Pulses Present:  Yes.    Dressing Applied:  Yes.    Comments:  VS remain stable during sheath pull

## 2015-03-15 NOTE — Anesthesia Procedure Notes (Signed)
Procedure Name: Intubation Date/Time: 03/15/2015 7:55 AM Performed by: Julian Reil Pre-anesthesia Checklist: Patient identified, Emergency Drugs available, Suction available and Patient being monitored Patient Re-evaluated:Patient Re-evaluated prior to inductionOxygen Delivery Method: Circle system utilized Preoxygenation: Pre-oxygenation with 100% oxygen Intubation Type: IV induction Ventilation: Unable to mask ventilate Laryngoscope Size: Glidescope and 4 Grade View: Grade I Tube type: Oral Tube size: 7.5 mm Number of attempts: 1 Airway Equipment and Method: Stylet and Video-laryngoscopy Placement Confirmation: positive ETCO2,  ETT inserted through vocal cords under direct vision and breath sounds checked- equal and bilateral Secured at: 23 cm Tube secured with: Tape Dental Injury: Teeth and Oropharynx as per pre-operative assessment

## 2015-03-15 NOTE — H&P (Signed)
PCP: [name], MD Cardiologist: Dr Marlou Porch Primary Electrophysiologist: [name], MD   Chief Complaint  Patient presents with  . Persistent AFIB    History of Present Illness: Wayne Ortiz is a 69 y.o. male who presents today for afib ablation. He has h/o CAD- recent DES to LCX and DES x2 to RCA at [facility] 2/2016and persistent afib. He previously failed medical therapy with amiodarone. He was subsequently treated with a rate control strategy by Dr Harl Bowie but reports not feeling well. He has significant SOB during afib. He recently had a sleep study and showed mild sleep apnea, cpap not recommended, per report in the computer.  He has had DCCV x2 in the remote past. Apixaban has been added for chadsvasc score of at least 3 but now is on eliquis. Has obesity, cannot exercise due to back problems but when not in afib is very active around the house. He was recently hospitalized and started on sotalol. He did well initially but has since had recurrent atrial fibrillation. He did have symptomatic sinus bradycardia with HR 40s on sotalol.  Today, he denies symptoms of palpitations, chest pain, orthopnea, PND, lower extremity edema, claudication, dizziness, presyncope, syncope, bleeding, or neurologic sequela. The patient is tolerating medications without difficulties and is otherwise without complaint today.    Past Medical History  Diagnosis Date  . Hypothyroidism   . Hypertension   . Gout   . Femur fracture   . Persistent atrial fibrillation   . Complication of anesthesia     BECAME COMBATIVE  . Arthritis   . History of gout   . Precancerous skin lesion   . GERD (gastroesophageal reflux disease)     occasional TUMS  . History of kidney stones   . BPH (benign prostatic hypertrophy)   . CAD (coronary artery disease)     a. s/p DES 05/2014 at [facility]  . Dysrhythmia   . Atrial fibrillation     Past Surgical History  Procedure Laterality Date  . Cervical spine surgery    . Arm fracture surgery Left   . Femur fracture surgery    . Ercp [name] 01/02/2013    Procedure: ENDOSCOPIC RETROGRADE CHOLANGIOPANCREATOGRAPHY (ERCP); Surgeon: [name], MD; Location: [name]; Service: Endoscopy; Laterality: [name];  . Sphincterotomy [name] 01/02/2013    Procedure: SPHINCTEROTOMY; Surgeon: [name], MD; Location: [name]; Service: Endoscopy; Laterality: [name]; Stone Extraction  . Cholecystectomy [name] 01/03/2013    Procedure: LAPAROSCOPIC CHOLECYSTECTOMY; Surgeon: [name], MD; Location: [name]; Service: General; Laterality: [name];  . Liver biopsy [name] 01/03/2013    Procedure: LIVER BIOPSY; Surgeon: [name], MD; Location: [name]; Service: General; Laterality: [name];  . Cataracts      EXCISION  . Total knee arthroplasty Right 05/30/2013    Procedure: RIGHT TOTAL KNEE ARTHROPLASTY; Surgeon: [name], MD; Location: [name]; Service: Orthopedics; Laterality: Right;  . Cardioversion [name] 12/09/2014    Procedure: CARDIOVERSION; Surgeon: [name], MD; Location: [facility] OR; Service: Cardiovascular; Laterality: [name];     Current Outpatient Prescriptions  Medication Sig Dispense Refill  . allopurinol (ZYLOPRIM) 300 MG tablet Take 300 mg by mouth every morning.     [name] apixaban (ELIQUIS) 5 MG TABS tablet Take 1 tablet (5 mg total) by mouth 2 (two) times daily. 60 tablet 6  . diclofenac (VOLTAREN) 75 MG EC tablet Take 75 mg by mouth daily.     [name] HYDROcodone-acetaminophen (NORCO) 7.5-325 MG per tablet Take 1-2 tablets by mouth every  4 (four) hours as needed for moderate pain. For pain 100 tablet 0  . lisinopril (PRINIVIL,ZESTRIL) 2.5 MG tablet Take 1 tablet (2.5 mg total) by mouth at bedtime. 90 tablet 3  . menthol-zinc oxide (GOLD BOND) powder Apply 1 application topically daily.     . mometasone (NASONEX) 50 MCG/ACT nasal spray Place 2 sprays into the nose daily.    . nitroGLYCERIN (NITROSTAT) 0.4 MG SL tablet Place 0.4 mg under the tongue every 5 (five) minutes x 3 doses as needed for chest pain.     . sotalol (BETAPACE) 120 MG tablet Take 1 tablet (120 mg total) by mouth every 12 (twelve) hours. 60 tablet 11  . SYNTHROID 150 MCG tablet Take 150 mg by mouth daily  0  . tamsulosin (FLOMAX) 0.4 MG CAPS capsule Take 0.4 mg by mouth every morning.    [name] acetaminophen (TYLENOL) 325 MG tablet Take 2 tablets (650 mg total) by mouth every 4 (four) hours as needed for headache or mild pain. (Patient not taking: Reported on 01/22/2015)     No current facility-administered medications for this visit.    Allergies: Review of patient's allergies indicates no known allergies.   Social History: The patient  reports that he quit smoking about 41 years ago. He has never used smokeless tobacco. He reports that he drinks alcohol. He reports that he does not use illicit drugs.   Family History: The patient's family history includes Kidney Stones in his mother.    ROS: Please see the history of present illness. All other systems are reviewed and negative.    PHYSICAL EXAM: Filed Vitals:   03/15/15 0538  BP: 122/72  Pulse: 88  Temp: 98 F (36.7 C)  Resp: 18   GEN: overweight, in no acute distress  HEENT: normal  Neck: no JVD, carotid bruits, or masses Cardiac: iRRR; no murmurs, rubs, or gallops,no edema  Respiratory: clear to auscultation bilaterally, normal work of breathing GI: soft, nontender, nondistended, + BS MS: no deformity or atrophy  Skin: warm and dry  Neuro: Strength and sensation are intact Psych: euthymic mood, full affect  Labs reviewed today  Lipid Panel   Labs (Brief)       Component Value Date/Time   CHOL 164 08/08/2014 0711   TRIG 104 08/08/2014 0711   HDL 41 08/08/2014 0711    CHOLHDL 4.0 08/08/2014 0711   VLDL 21 08/08/2014 0711   LDLCALC 102* 08/08/2014 0711       Wt Readings from Last 3 Encounters:  01/22/15 275 lb 1.9 oz (124.794 kg)  12/28/14 272 lb 3.2 oz (123.469 kg)  12/15/14 271 lb (122.925 kg)      Other studies Reviewed: Additional studies/ records that were reviewed today include: myoview from 5/16   ASSESSMENT AND PLAN:  1. Persistent atrial fibrillation The patient has symptomatic afib. He reports symptoms suggestive of vagal triggers as precipitants for his afib. He has failed medical therapy with sotalol and amiodarone. His chads2vasc score is 3. He is anticoagulated with eliquis. Therapeutic strategies for afib including medicine and ablation were discussed in detail with the patient today. Risk, benefits, and alternatives to EP study and radiofrequency ablation for afib were also discussed in detail today. These risks include but are not limited to stroke, bleeding, vascular damage, tamponade, perforation, damage to the esophagus, lungs, and other structures, pulmonary vein stenosis, worsening renal function, and death. The patient understands these risk and wishes to proceed. In addition, I have discussed risks and benefits of  transesophageal echocardiogram which will be performed at the beginning of the procedure today. He understands risks and wishes to proceed.  [name] MD, [facility] 03/15/2015 7:25 AM

## 2015-03-15 NOTE — Progress Notes (Signed)
Md aware about the redness of left sclera,no order given.

## 2015-03-15 NOTE — Progress Notes (Signed)
Left sclera reddened and slightly swollen claimed that he was in his yard prior to admission and something hit his left eye. Warm compress applied for 20 min and claimed it lessened  the soreness.

## 2015-03-15 NOTE — Progress Notes (Signed)
Admission from the EP lab by bed awake and alert. Instructed to call for any sign of bleeding on the right groin, to avoid bending the right knee and if coughing or sneezing to press on the eight groin dressing area.

## 2015-03-16 DIAGNOSIS — I481 Persistent atrial fibrillation: Secondary | ICD-10-CM | POA: Diagnosis not present

## 2015-03-16 DIAGNOSIS — I2510 Atherosclerotic heart disease of native coronary artery without angina pectoris: Secondary | ICD-10-CM | POA: Diagnosis not present

## 2015-03-16 DIAGNOSIS — I10 Essential (primary) hypertension: Secondary | ICD-10-CM | POA: Diagnosis not present

## 2015-03-16 LAB — BASIC METABOLIC PANEL
ANION GAP: 5 (ref 5–15)
BUN: 17 mg/dL (ref 6–20)
CO2: 28 mmol/L (ref 22–32)
Calcium: 8.1 mg/dL — ABNORMAL LOW (ref 8.9–10.3)
Chloride: 105 mmol/L (ref 101–111)
Creatinine, Ser: 1.16 mg/dL (ref 0.61–1.24)
GFR calc Af Amer: >60 mL/min (ref >60)
GLUCOSE: 124 mg/dL — AB (ref 65–99)
POTASSIUM: 4.4 mmol/L (ref 3.5–5.1)
Sodium: 138 mmol/L (ref 135–145)

## 2015-03-16 MED ORDER — PANTOPRAZOLE SODIUM 40 MG PO TBEC: 40.0000 mg | DELAYED_RELEASE_TABLET | Freq: Every day | ORAL | Status: DC

## 2015-03-16 MED ORDER — MUPIROCIN 2 % EX OINT: 1.0000 "application " | TOPICAL_OINTMENT | Freq: Two times a day (BID) | CUTANEOUS | Status: AC

## 2015-03-16 NOTE — Discharge Summary (Signed)
ELECTROPHYSIOLOGY PROCEDURE DISCHARGE SUMMARY    Patient ID: Wayne Ortiz,  MRN: [id number], DOB/AGE: 1946/04/26 69 y.o.  Admit date: 03/15/2015 Discharge date: 03/16/2015  Primary Care Physician: [name], MD Primary Cardiologist: Dr. Marlou Porch Electrophysiologist: [name], MD  Primary Discharge Diagnosis:  1. persisent Atrial fibrillation CHADS2vasc = 3 on Eliquis  Secondary Discharge Diagnosis:  1. CAD 2. HTN  Procedures This Admission:  1.  Electrophysiology study and radiofrequency catheter ablation on 03/15/15 by Dr [name].  This study demonstrated: .   CONCLUSIONS: 1. Atrial fibrillation upon presentation.  2. Rotational Angiography reveals a moderate sized left atrium with four separate pulmonary veins without evidence of pulmonary vein stenosis. 3. Successful electrical isolation and anatomical encircling of all four pulmonary veins with radiofrequency current. A WACA approach was used 4. Additional ablation performed as a box lesion along the posterior wall of the left atrium as well as at the junction of the SVC/RA.  5. Atrial fibrillation successfully cardioverted to sinus rhythm. 6. No early apparent complications.  Brief HPI: BRANTSON Ortiz is a 69 y.o. male with a history of persistent atrial fibrillation.  They have failed medical therapy with amiodarone. Risks, benefits, and alternatives to catheter ablation of atrial fibrillation were reviewed with the patient who wished to proceed.  The patient underwent TEE prior to the procedure which demonstrated normal LV function and no LAA thrombus.    Hospital Course:  The patient was admitted and underwent EPS/RFCA of atrial fibrillation with details as outlined above.  They were monitored on telemetry overnight which demonstrated SR only.  Groin was without complication on the day of discharge.  The patient was examined and considered to be stable for discharge.  Wound care and restrictions were  reviewed with the patient.  The patient will be seen back by Roderic Palau, NP in 4 weeks and Dr [name] in 12 weeks for post ablation follow up. His MRSA nasal swap was positive, he is being sent home with mupirocin ointment to use BID x5 days.  This patients CHA2DS2-VASc Score and unadjusted Ischemic Stroke Rate (% per year) is equal to 3.2 % stroke rate/year from a score of 3   Physical Exam: Filed Vitals:   03/15/15 1949 03/15/15 2300 03/16/15 0403 03/16/15 0801  BP: 112/91 106/74 110/86 111/84  Pulse:    69  Temp: 97.5 F (36.4 C) 97.8 F (36.6 C) 98.1 F (36.7 C) 97.9 F (36.6 C)  TempSrc: Oral Oral Oral Oral  Resp: 20 16 16 16   Height:      Weight:      SpO2: 95% 95% 96% 97%    GEN- The patient is well appearing, alert and oriented x 3 today.   HEENT: normocephalic, atraumatic; sclera clear, conjunctiva pink; hearing intact; oropharynx clear; neck supple  Lungs- Clear to ausculation bilaterally, normal work of breathing.  No wheezes, rales, rhonchi Heart- Regular rate and rhythm, no murmurs, rubs or gallops  GI- soft, non-tender, non-distended, bowel sounds present  Extremities- no clubbing, cyanosis, or edema; DP/PT/radial pulses 2+ bilaterally, groin without hematoma/bruit MS- no significant deformity or atrophy Skin- warm and dry, no rash or lesion Psych- euthymic mood, full affect Neuro- strength and sensation are intact   Labs:   Lab Results  Component Value Date   WBC 4.9 03/08/2015   HGB 16.7 03/08/2015   HCT 47.6 03/08/2015   MCV 97.1 03/08/2015   PLT 152 03/08/2015     Recent Labs Lab 03/16/15 0359  NA  138  K 4.4  CL 105  CO2 28  BUN 17  CREATININE 1.16  CALCIUM 8.1*  GLUCOSE 124*     Discharge Medications:    Medication List    TAKE these medications        acetaminophen 325 MG tablet  Commonly known as:  TYLENOL  Take 2 tablets (650 mg total) by mouth every 4 (four) hours as needed for headache or mild pain.     allopurinol 300 MG  tablet  Commonly known as:  ZYLOPRIM  Take 300 mg by mouth every morning.     apixaban 5 MG Tabs tablet  Commonly known as:  ELIQUIS  Take 1 tablet (5 mg total) by mouth 2 (two) times daily.     clopidogrel 75 MG tablet  Commonly known as:  PLAVIX  Take 75 mg by mouth daily.     diclofenac 75 MG EC tablet  Commonly known as:  VOLTAREN  Take 75 mg by mouth daily.     HYDROcodone-acetaminophen 7.5-325 MG tablet  Commonly known as:  NORCO  Take 1-2 tablets by mouth every 4 (four) hours as needed for moderate pain. For pain     lisinopril 2.5 MG tablet  Commonly known as:  PRINIVIL,ZESTRIL  Take 1 tablet (2.5 mg total) by mouth at bedtime.     menthol-zinc oxide powder  Apply 1 application topically daily.     mometasone 50 MCG/ACT nasal spray  Commonly known as:  NASONEX  Place 2 sprays into the nose daily as needed (allergies).     mupirocin ointment 2 %  Commonly known as:  BACTROBAN  Place 1 application into the nose 2 (two) times daily.     nitroGLYCERIN 0.4 MG SL tablet  Commonly known as:  NITROSTAT  Place 0.4 mg under the tongue every 5 (five) minutes x 3 doses as needed for chest pain.     pantoprazole 40 MG tablet  Commonly known as:  PROTONIX  Take 1 tablet (40 mg total) by mouth daily.     sotalol 120 MG tablet  Commonly known as:  BETAPACE  Take 1 tablet (120 mg total) by mouth every 12 (twelve) hours.     SYNTHROID 150 MCG tablet  Generic drug:  levothyroxine  Take 150 mg by mouth daily     tamsulosin 0.4 MG Caps capsule  Commonly known as:  FLOMAX  Take 0.4 mg by mouth every morning.        Disposition: Home Discharge Instructions    Diet - low sodium heart healthy    Complete by:  As directed      Increase activity slowly    Complete by:  As directed           Follow-up Information    Follow up with [facility] On 04/12/2015.   Specialty:  Cardiology   Why:  1:30PM   Contact information:   [id number] [place] [phone]      Follow up with [name], MD On 06/20/2015.   Specialty:  Cardiology   Why:  12:00 noon   Contact information:   [place] [address] [phone]       Duration of Discharge Encounter: Greater than 30 minutes including physician time.  Signed, Tommye Standard, PA-C 03/16/2015 11:33 AM   I have seen, examined the patient, and reviewed the above assessment and plan.  On exam,RRR.  Changes to  above are made where necessary.    Co Sign: [name], MD

## 2015-03-16 NOTE — Discharge Instructions (Signed)
No driving for 4days. No lifting over 5 lbs for 1 week. No sexual activity for 1 week. You may return to work on 03/23/15. Keep procedure site clean & dry. If you notice increased pain, swelling, bleeding or pus, call/return!  You may shower, but no soaking baths/hot tubs/pools for 1 week.     You have an appointment set up with the [facility].  Multiple studies have shown that being followed by a dedicated atrial fibrillation clinic in addition to the standard care you receive from your other physicians improves health. We believe that enrollment in the atrial fibrillation clinic will allow [place] to better care for you.   The phone number to the [facility] is [phone]. The clinic is staffed Monday through Friday from 8:30am to 5pm.  Parking Directions: The clinic is located in the Heart and Vascular Building connected to [facility]. 1)From [address] turn on to [address] and go to the 3rd entrance  (Heart and Vascular entrance) on the right. 2)Look to the right for Heart &Vascular Parking Garage. 3)A code for the entrance is required please call the clinic to receive this.   4)Take the elevators to the 1st floor. Registration is in the room with the glass walls at the end of the hallway.  If you have any trouble parking or locating the clinic, please dont hesitate to call [phone].

## 2015-03-16 NOTE — Progress Notes (Signed)
Pt given all discharge instructions with pt verbalizing understanding of all as well as meds/schedule.  All belongings with pt and awaiting transport. Rt groin site unremarkable. Home with wife.

## 2015-03-19 ENCOUNTER — Ambulatory Visit (HOSPITAL_COMMUNITY)
Admission: RE | Admit: 2015-03-19 | Discharge: 2015-03-19 | Disposition: A | Discharge status: Home / Self Care | Payer: Medicare HMO | Source: Ambulatory Visit | Attending: Nurse Practitioner | Admitting: Nurse Practitioner

## 2015-03-19 ENCOUNTER — Encounter (HOSPITAL_COMMUNITY): Payer: Self-pay | Admitting: Internal Medicine

## 2015-03-19 VITALS — BP 152/90 | HR 48 | Ht 71.0 in | Wt 282.2 lb

## 2015-03-19 DIAGNOSIS — R0602 Shortness of breath: Secondary | ICD-10-CM

## 2015-03-19 LAB — BASIC METABOLIC PANEL
ANION GAP: 6 (ref 5–15)
BUN: 16 mg/dL (ref 6–20)
CALCIUM: 8.8 mg/dL — AB (ref 8.9–10.3)
CO2: 27 mmol/L (ref 22–32)
Chloride: 106 mmol/L (ref 101–111)
Creatinine, Ser: 1.1 mg/dL (ref 0.61–1.24)
GFR calc Af Amer: >60 mL/min (ref >60)
GLUCOSE: 115 mg/dL — AB (ref 65–99)
Potassium: 4.2 mmol/L (ref 3.5–5.1)
SODIUM: 139 mmol/L (ref 135–145)

## 2015-03-19 MED ORDER — FUROSEMIDE 40 MG PO TABS: ORAL_TABLET | ORAL | Status: DC

## 2015-03-19 NOTE — Progress Notes (Signed)
Patient ID: PIERCESON CERRO, male   DOB: 07-05-1945, 69 y.o.   MRN: [id number]     Primary Care Physician: [name], MD Referring Physician: [name] NAGEE PREY is a 69 y.o. male with a h/o persistent afib, s/p afib ablation 11/17. He called the office this am wanting to be seen for shortness of breath starting one day after the procedure. He reports that he feels well when sitting but when he moves around, he will get short of breath. He has h/o wheezing, but he has wheezed more this weekend. He tried to put on his jeans this am and could not button the pants, he could wear last week. No significant PND/orthopnea. He has not noticed any  afib and ekg shows S brady at 48 bpm, but this has been his norm. He continues  on sotalol.Continues on blood thinner. Denies any issues with  swallowing or rt groin pain.  Today, he denies symptoms of palpitations, chest pain, shortness of breath, orthopnea, PND, lower extremity edema, dizziness, presyncope, syncope, or neurologic sequela. The patient is tolerating medications without difficulties and is otherwise without complaint today.   Past Medical History  Diagnosis Date  . Hypothyroidism   . Hypertension   . Gout   . Femur fracture ([place])   . Persistent atrial fibrillation ([place])   . Complication of anesthesia     BECAME COMBATIVE  . Arthritis   . History of gout   . Precancerous skin lesion   . GERD (gastroesophageal reflux disease)     occasional TUMS  . History of kidney stones   . BPH (benign prostatic hypertrophy)   . CAD (coronary artery disease)     a. s[facility]  . Dysrhythmia   . Atrial fibrillation [facility])    Past Surgical History  Procedure Laterality Date  . Cervical spine surgery    . Arm fracture surgery Left   . Femur fracture surgery    . Ercp [name] 01/02/2013    Procedure: ENDOSCOPIC RETROGRADE CHOLANGIOPANCREATOGRAPHY (ERCP);  Surgeon: [name], MD;  [facility]: [name];  Service: Endoscopy;   Laterality: [name];  . Sphincterotomy [name] 01/02/2013    Procedure: SPHINCTEROTOMY;  Surgeon: [name], MD;  Location: [name];  Service: Endoscopy;  Laterality: [name];  Stone Extraction  . Cholecystectomy [name] 01/03/2013    Procedure: LAPAROSCOPIC CHOLECYSTECTOMY;  Surgeon: [name], MD;  Location: [name];  Service: General;  Laterality: [name];  . Liver biopsy [name] 01/03/2013    Procedure: LIVER BIOPSY;  Surgeon: [name], MD;  Location: [name];  Service: General;  Laterality: [name];  . Cataracts      EXCISION  . Total knee arthroplasty Right 05/30/2013    Procedure: RIGHT TOTAL KNEE ARTHROPLASTY;  Surgeon: [name], MD;  Location: [name];  Service: Orthopedics;  Laterality: Right;  . Cardioversion [name] 12/09/2014    Procedure: CARDIOVERSION;  Surgeon: [name], MD;  Location: [place];  Service: Cardiovascular;  Laterality: [name];  . Electrophysiologic study [name] 03/15/2015    Procedure: Atrial Fibrillation Ablation;  Surgeon: [name], MD;  Location: [place] CV LAB;  Service: Cardiovascular;  Laterality: [name];    Current Outpatient Prescriptions  Medication Sig Dispense Refill  . acetaminophen (TYLENOL) 325 MG tablet Take 2 tablets (650 mg total) by mouth every 4 (four) hours as needed for headache or mild pain.    [name] allopurinol (ZYLOPRIM) 300 MG tablet Take 300 mg by mouth  every morning.     [name] apixaban (ELIQUIS) 5 MG TABS tablet Take 1 tablet (5 mg total) by mouth 2 (two) times daily. 60 tablet 6  . clopidogrel (PLAVIX) 75 MG tablet Take 75 mg by mouth daily.    . diclofenac (VOLTAREN) 75 MG EC tablet Take 75 mg by mouth daily.     [name] HYDROcodone-acetaminophen (NORCO) 7.5-325 MG per tablet Take 1-2 tablets by mouth every 4 (four) hours as needed for moderate pain. For pain 100 tablet 0  . menthol-zinc oxide (GOLD BOND) powder Apply 1 application topically daily.    . mometasone (NASONEX) 50 MCG/ACT nasal spray Place 2 sprays into the nose daily as needed (allergies).     .  mupirocin ointment (BACTROBAN) 2 % Place 1 application into the nose 2 (two) times daily. 22 g 0  . nitroGLYCERIN (NITROSTAT) 0.4 MG SL tablet Place 0.4 mg under the tongue every 5 (five) minutes x 3 doses as needed for chest pain.     . pantoprazole (PROTONIX) 40 MG tablet Take 1 tablet (40 mg total) by mouth daily. 45 tablet 0  . sotalol (BETAPACE) 120 MG tablet Take 1 tablet (120 mg total) by mouth every 12 (twelve) hours. 60 tablet 11  . SYNTHROID 150 MCG tablet Take 150 mg by mouth daily  0  . tamsulosin (FLOMAX) 0.4 MG CAPS capsule Take 0.4 mg by mouth every morning.    . furosemide (LASIX) 40 MG tablet Take 1 tablet twice daily for 2 days then decrease to 1 tablet daily 45 tablet 1  . lisinopril (PRINIVIL,ZESTRIL) 2.5 MG tablet Take 1 tablet (2.5 mg total) by mouth at bedtime. (Patient not taking: Reported on 03/19/2015) 90 tablet 3   No current facility-administered medications for this encounter.    No Known Allergies  Social History   Social History  . Marital Status: Married    Spouse Name: [name]  . Number of Children: [name]  . Years of Education: [name]   Occupational History  . Not on file.   Social History Main Topics  . Smoking status: Former Smoker    Quit date: 05/23/1973  . Smokeless tobacco: Never Used  . Alcohol Use: 0.0 oz/week    0 Standard drinks or equivalent per week     Comment: RARE  . Drug Use: No  . Sexual Activity: Not on file   Other Topics Concern  . Not on file   Social History Narrative    Family History  Problem Relation Age of Onset  . Kidney Stones Mother     ROS- All systems are reviewed and negative except as per the HPI above  Physical Exam: Filed Vitals:   03/19/15 1436  BP: 152/90  Pulse: 48  Height: 5\' 11"  (1.803 m)  Weight: 282 lb 3.2 oz (128.005 kg)  SpO2: 94%    GEN- The patient is well appearing, alert and oriented x 3 today.   Head- normocephalic, atraumatic Eyes-  Sclera clear, conjunctiva pink Ears- hearing  intact Oropharynx- clear Neck- supple, no JVP Lymph- no cervical lymphadenopathy Lungs- Clear to ausculation bilaterally, normal work of breathing Heart-Slow, regular rate and rhythm, no murmurs, rubs or gallops, PMI not laterally displaced GI- soft, NT, ND, + BS Extremities- no clubbing, cyanosis, trace edema MS- no significant deformity or atrophy Skin- no rash or lesion Psych- euthymic mood, full affect Neuro- strength and sensation are intact  EKG-S brady at 48 bpm, pr int 166 ms, qrs 94 ms, qtc 439 ms Epic  records reviewed  Assessment and Plan: 1. afib S/p ablation 11/17 C/o shortness of breath, maintaining SR. Estimate that weight is up around 5-6 lbs. Probably fluid overloaded from recent procedure Lasix 20 mg, 2 tablets this pm and repeat in am. Weigh daily. Call tomorrow afternoon with symptoms and weight loss Will then decide how much lasix he will continue on For now, anticipate short term lasix use and he will eat extra bananas for potassium for the next few days. Bmet today Continue apixaban, sotalol   Pt discussed with Dr. [name] and he agreed on the above plan  Butch Penny C. Ishmael Berkovich, [facility] [address] [place], [place] [address] [phone]

## 2015-03-19 NOTE — Patient Instructions (Addendum)
Your physician has recommended you make the following change in your medication:  1)Lasix 40mg  -- take 40mg  this evening and in the morning then call with weight. -- [phone]

## 2015-03-20 ENCOUNTER — Telehealth (HOSPITAL_COMMUNITY): Payer: Self-pay | Admitting: *Deleted

## 2015-03-20 MED ORDER — FUROSEMIDE 40 MG PO TABS: ORAL_TABLET | ORAL | Status: DC

## 2015-03-20 NOTE — Telephone Encounter (Signed)
Patient called back in stating he feels much improved from yesterday.  Feels his fluid status is much decreased since taking 40mg  of lasix last night and this morning.  Butch Penny instructed patient to take lasix 40mg  tomorrow and then only as needed if notice increase in weight, swelling or shortness of breath. Patient verbalized understanding.

## 2015-04-11 ENCOUNTER — Other Ambulatory Visit: Payer: Self-pay | Admitting: Cardiology

## 2015-04-12 ENCOUNTER — Ambulatory Visit (HOSPITAL_COMMUNITY)
Admit: 2015-04-12 | Discharge: 2015-04-12 | Disposition: A | Discharge status: Home / Self Care | Payer: Medicare HMO | Source: Ambulatory Visit | Attending: Nurse Practitioner | Admitting: Nurse Practitioner

## 2015-04-12 VITALS — BP 128/86 | HR 61 | Ht 70.0 in | Wt 277.2 lb

## 2015-04-12 DIAGNOSIS — R609 Edema, unspecified: Secondary | ICD-10-CM | POA: Insufficient documentation

## 2015-04-12 DIAGNOSIS — R0609 Other forms of dyspnea: Secondary | ICD-10-CM | POA: Insufficient documentation

## 2015-04-12 DIAGNOSIS — I4891 Unspecified atrial fibrillation: Secondary | ICD-10-CM | POA: Diagnosis not present

## 2015-04-12 DIAGNOSIS — I4819 Other persistent atrial fibrillation: Secondary | ICD-10-CM

## 2015-04-12 DIAGNOSIS — I481 Persistent atrial fibrillation: Secondary | ICD-10-CM

## 2015-04-12 LAB — BASIC METABOLIC PANEL
ANION GAP: 8 (ref 5–15)
BUN: 19 mg/dL (ref 6–20)
CALCIUM: 9.5 mg/dL (ref 8.9–10.3)
CO2: 29 mmol/L (ref 22–32)
Chloride: 104 mmol/L (ref 101–111)
Creatinine, Ser: 1.1 mg/dL (ref 0.61–1.24)
GFR calc non Af Amer: >60 mL/min (ref >60)
Glucose, Bld: 110 mg/dL — ABNORMAL HIGH (ref 65–99)
Potassium: 4.6 mmol/L (ref 3.5–5.1)
Sodium: 141 mmol/L (ref 135–145)

## 2015-04-12 MED ORDER — FUROSEMIDE 40 MG PO TABS: 20.0000 mg | ORAL_TABLET | Freq: Every day | ORAL | Status: DC

## 2015-04-12 NOTE — Patient Instructions (Signed)
Once we see labwork we will call you regarding increasing lasix for next 3 days.

## 2015-04-12 NOTE — Progress Notes (Signed)
Patient ID: Wayne Ortiz, male   DOB: 1945-05-14, 69 y.o.   MRN: [id number]     Primary Care Physician: [name], MD Referring Physician: [name] Wayne Ortiz is a 69 y.o. male with a h/o afib s/p ablation 11/17.  He had early issues with worsening shortness of breath and lasix was increased for several days with improvement of symptoms. He improved but for the last few days has felt full in his abdomen again. He has chronic shortness of breath, most likely multifactorial. He has back issues and is inactive. He has been encouraged to try to minimize antiinflammatories which will increase his bleeding risk as well as contributing to fluid retention. He has not noticed any afib since procedure and continues on sotalol.   Today, he denies symptoms of palpitations, chest pain, shortness of breath, orthopnea, PND, lower extremity edema, dizziness, presyncope, syncope, or neurologic sequela. The patient is tolerating medications without difficulties and is otherwise without complaint today.   Past Medical History  Diagnosis Date  . Hypothyroidism   . Hypertension   . Gout   . Femur fracture ([place])   . Persistent atrial fibrillation ([place])   . Complication of anesthesia     BECAME COMBATIVE  . Arthritis   . History of gout   . Precancerous skin lesion   . GERD (gastroesophageal reflux disease)     occasional TUMS  . History of kidney stones   . BPH (benign prostatic hypertrophy)   . CAD (coronary artery disease)     a. s/p DES 05/2014 at [facility]  . Dysrhythmia   . Atrial fibrillation [facility])    Past Surgical History  Procedure Laterality Date  . Cervical spine surgery    . Arm fracture surgery Left   . Femur fracture surgery    . Ercp [name] 01/02/2013    Procedure: ENDOSCOPIC RETROGRADE CHOLANGIOPANCREATOGRAPHY (ERCP);  Surgeon: [name], MD;  [name];  Service: Endoscopy;  Laterality: [name];  . Sphincterotomy [name] 01/02/2013    Procedure: SPHINCTEROTOMY;  Surgeon: [name], MD;  Location[name];  Service: Endoscopy;  Laterality: [name];  Stone Extraction  . Cholecystectomy [name] 01/03/2013    Procedure: LAPAROSCOPIC CHOLECYSTECTOMY;  Surgeon: [name], MD;  Location: [name];  Service: General;  Laterality: [name];  . Liver biopsy [name] 01/03/2013    Procedure: LIVER BIOPSY;  Surgeon: [name], MD;  Location: [name];  Service: General;  Laterality: [name];  . Cataracts      EXCISION  . Total knee arthroplasty Right 05/30/2013    Procedure: RIGHT TOTAL KNEE ARTHROPLASTY;  Surgeon: [name], MD;  [name];  Service: Orthopedics;  Laterality: Right;  . Cardioversion [name] 12/09/2014    Procedure: CARDIOVERSION;  Surgeon: [name], MD;  Location: [place];  Service: Cardiovascular;  Laterality: [name];  . Electrophysiologic study [name] 03/15/2015    Procedure: Atrial Fibrillation Ablation;  Surgeon: [name], MD;  Location: [place] CV LAB;  Service: Cardiovascular;  Laterality: [name];    Current Outpatient Prescriptions  Medication Sig Dispense Refill  . acetaminophen (TYLENOL) 325 MG tablet Take 2 tablets (650 mg total) by mouth every 4 (four) hours as needed for headache or mild pain.    [name] allopurinol (ZYLOPRIM) 300 MG tablet Take 300 mg by mouth every morning.     [name] apixaban (ELIQUIS) 5 MG TABS tablet Take 1 tablet (5 mg total) by mouth 2 (two) times daily. 60 tablet  6  . clopidogrel (PLAVIX) 75 MG tablet Take 75 mg by mouth daily.    . diclofenac (VOLTAREN) 75 MG EC tablet Take 75 mg by mouth daily.     . furosemide (LASIX) 40 MG tablet Take 0.5 tablets (20 mg total) by mouth daily. May take 1/2 tablet by mouth as needed for increased weight, swelling, shortness of breath 30 tablet 3  . HYDROcodone-acetaminophen (NORCO) 7.5-325 MG per tablet Take 1-2 tablets by mouth every 4 (four) hours as needed for moderate pain. For pain 100 tablet 0  . lisinopril (PRINIVIL,ZESTRIL) 2.5 MG tablet Take 1 tablet (2.5 mg total) by mouth at bedtime. 90 tablet 3    . menthol-zinc oxide (GOLD BOND) powder Apply 1 application topically daily.    . mometasone (NASONEX) 50 MCG/ACT nasal spray Place 2 sprays into the nose daily as needed (allergies).     . nitroGLYCERIN (NITROSTAT) 0.4 MG SL tablet Place 0.4 mg under the tongue every 5 (five) minutes x 3 doses as needed for chest pain.     . pantoprazole (PROTONIX) 40 MG tablet Take 1 tablet (40 mg total) by mouth daily. 45 tablet 0  . sotalol (BETAPACE) 120 MG tablet TAKE 1 TABLET(120 MG) BY MOUTH EVERY 12 HOURS 180 tablet 0  . SYNTHROID 150 MCG tablet Take 150 mg by mouth daily  0  . tamsulosin (FLOMAX) 0.4 MG CAPS capsule Take 0.4 mg by mouth every morning.     No current facility-administered medications for this encounter.    No Known Allergies  Social History   Social History  . Marital Status: Married    Spouse Name: [name]  . Number of Children: [name]  . Years of Education: [name]   Occupational History  . Not on file.   Social History Main Topics  . Smoking status: Former Smoker    Quit date: 05/23/1973  . Smokeless tobacco: Never Used  . Alcohol Use: 0.0 oz/week    0 Standard drinks or equivalent per week     Comment: RARE  . Drug Use: No  . Sexual Activity: Not on file   Other Topics Concern  . Not on file   Social History Narrative    Family History  Problem Relation Age of Onset  . Kidney Stones Mother     ROS- All systems are reviewed and negative except as per the HPI above  Physical Exam: Filed Vitals:   04/12/15 1325  BP: 128/86  Pulse: 61  Height: 5\' 10"  (1.778 m)  Weight: 277 lb 3.2 oz (125.737 kg)    GEN- The patient is well appearing, alert and oriented x 3 today.   Head- normocephalic, atraumatic Eyes-  Sclera clear, conjunctiva pink Ears- hearing intact Oropharynx- clear Neck- supple, no JVP Lymph- no cervical lymphadenopathy Lungs- Clear to ausculation bilaterally, normal work of breathing Heart- Regular rate and rhythm, no murmurs, rubs or gallops,  PMI not laterally displaced GI- soft, NT, ND, + BS Extremities- no clubbing, cyanosis, or edema MS- no significant deformity or atrophy Skin- no rash or lesion Psych- euthymic mood, full affect Neuro- strength and sensation are intact  EKG-SR at 61 bpm, pr int 150 ms, QRS 94 ms, QTc 446. Epic records reviewed.    Assessment and Plan: 1. afib  S/p ablation and doing well, staying in SR Continue sotalol for now Continue apixaban Also on clopidogrel Encouraged to minimize voltaren  to reduce bleeding risk/fluid issues  2. Chronic dyspnea/edema Feels full in abdomen for the last few  days Bmet and if ok increase lasix to 40 mg  X 3 days and then back to 20 mg a day Avoid salt, weigh daily Weight loss encouraged  F/u with Dr. [name] 2/22  Geroge Baseman. Nilsa Macht, [facility] [address] [place], [place] [address] [phone]

## 2015-04-27 ENCOUNTER — Telehealth: Payer: Self-pay

## 2015-04-27 NOTE — Telephone Encounter (Signed)
Spoke to pt and reminded him to fill out his application for eliquis assistance program. He stated that he is now on medicare part d, and he may not be eligible for any assistance. We told him we will do all we can to help him get approved.

## 2015-05-10 DIAGNOSIS — R0600 Dyspnea, unspecified: Secondary | ICD-10-CM | POA: Diagnosis not present

## 2015-05-10 DIAGNOSIS — I2510 Atherosclerotic heart disease of native coronary artery without angina pectoris: Secondary | ICD-10-CM | POA: Diagnosis not present

## 2015-05-10 DIAGNOSIS — I4891 Unspecified atrial fibrillation: Secondary | ICD-10-CM | POA: Diagnosis not present

## 2015-05-10 DIAGNOSIS — E039 Hypothyroidism, unspecified: Secondary | ICD-10-CM | POA: Diagnosis not present

## 2015-05-11 DIAGNOSIS — R0609 Other forms of dyspnea: Secondary | ICD-10-CM | POA: Diagnosis not present

## 2015-05-16 DIAGNOSIS — C030 Malignant neoplasm of upper gum: Secondary | ICD-10-CM | POA: Diagnosis not present

## 2015-05-16 DIAGNOSIS — R0600 Dyspnea, unspecified: Secondary | ICD-10-CM | POA: Diagnosis not present

## 2015-05-29 ENCOUNTER — Other Ambulatory Visit (HOSPITAL_COMMUNITY): Payer: Self-pay | Admitting: Internal Medicine

## 2015-05-29 DIAGNOSIS — R945 Abnormal results of liver function studies: Secondary | ICD-10-CM

## 2015-05-29 DIAGNOSIS — R7989 Other specified abnormal findings of blood chemistry: Secondary | ICD-10-CM

## 2015-06-04 ENCOUNTER — Telehealth (HOSPITAL_COMMUNITY): Payer: Self-pay | Admitting: *Deleted

## 2015-06-04 NOTE — Telephone Encounter (Signed)
Pt called in stating he will run out of sotalol and plavix before his upcoming appt with Dr. [name] and was wanting to see if he could go ahead and stop taking it instead of paying for more refills.  Patient reports no issues with Afib although he does continue to deal with SOB which seems to be related to fluid overload - states he is now taking lasix 40mg  from recommendation of PCP and this seems to help his SOB.  Patient states he does not weigh himself everyday but when he has increased SOB he takes an extra half of lasix and it helps.  Educated patient on importance of daily weights and decreasing salt intake -- pt reports he eats a lot of canned foods -- will discuss regarding sotalol and return call to patient. As far as plavix this was started following stent placement at [facility] - recommended he follow up with [name] his general cardiologist as to when to stop this. Discussed with Roderic Palau NP would prefer patient stay on sotalol until he sees Dr. [name] the end of month but if patient wishes he can stop. Patient states he will finish out his current bottle and then stop it. Will call if further issues with afib.

## 2015-06-04 NOTE — Telephone Encounter (Signed)
Patient called to see if he could be taken off of his Plavix - see note below for more details. I told him we would consult with his MD and contact him with any updates.

## 2015-06-06 ENCOUNTER — Ambulatory Visit (HOSPITAL_COMMUNITY)
Admission: RE | Admit: 2015-06-06 | Discharge: 2015-06-06 | Disposition: A | Discharge status: Home / Self Care | Payer: Medicare HMO | Source: Ambulatory Visit | Attending: Internal Medicine | Admitting: Internal Medicine

## 2015-06-06 DIAGNOSIS — K760 Fatty (change of) liver, not elsewhere classified: Secondary | ICD-10-CM | POA: Insufficient documentation

## 2015-06-06 DIAGNOSIS — R7989 Other specified abnormal findings of blood chemistry: Secondary | ICD-10-CM | POA: Diagnosis not present

## 2015-06-06 DIAGNOSIS — K7689 Other specified diseases of liver: Secondary | ICD-10-CM | POA: Diagnosis not present

## 2015-06-06 DIAGNOSIS — Z9049 Acquired absence of other specified parts of digestive tract: Secondary | ICD-10-CM | POA: Diagnosis not present

## 2015-06-06 DIAGNOSIS — R945 Abnormal results of liver function studies: Secondary | ICD-10-CM

## 2015-06-07 ENCOUNTER — Telehealth: Payer: Self-pay | Admitting: Cardiology

## 2015-06-07 NOTE — Telephone Encounter (Signed)
May stop plavix now as he has only 4 pills next

## 2015-06-07 NOTE — Telephone Encounter (Signed)
Ppt wants to stop plavix,see 06/04/15 phone note

## 2015-06-07 NOTE — Telephone Encounter (Signed)
Please see previous phone note from 06/04/15. / tg

## 2015-06-07 NOTE — Telephone Encounter (Signed)
From my review his last stents were at [facility] 06/22/14, he can stop taking at the end of the month, typically is used for a year after the last stent.   J Kevan Prouty MD

## 2015-06-14 ENCOUNTER — Telehealth: Payer: Self-pay | Admitting: Cardiology

## 2015-06-14 NOTE — Telephone Encounter (Signed)
Pt states that he is waiting to hear from patient assistance. I gave him samples and I will check on status.

## 2015-06-14 NOTE — Telephone Encounter (Signed)
Patent is asking about patient assistance for Eliquis and if we have any samples.  tg

## 2015-06-20 ENCOUNTER — Ambulatory Visit (INDEPENDENT_AMBULATORY_CARE_PROVIDER_SITE_OTHER): Payer: Medicare HMO | Admitting: Internal Medicine

## 2015-06-20 VITALS — BP 138/90 | HR 51 | Ht 71.0 in | Wt 277.4 lb

## 2015-06-20 DIAGNOSIS — I10 Essential (primary) hypertension: Secondary | ICD-10-CM | POA: Diagnosis not present

## 2015-06-20 DIAGNOSIS — I481 Persistent atrial fibrillation: Secondary | ICD-10-CM | POA: Diagnosis not present

## 2015-06-20 DIAGNOSIS — Q245 Malformation of coronary vessels: Secondary | ICD-10-CM | POA: Diagnosis not present

## 2015-06-20 DIAGNOSIS — I4819 Other persistent atrial fibrillation: Secondary | ICD-10-CM

## 2015-06-20 NOTE — Progress Notes (Signed)
Electrophysiology Office Note   Date:  06/20/2015   ID:  Wayne, Ortiz 1945/09/15, MRN [id number]  PCP:  [name], MD  Cardiologist:  Dr Harl Bowie Primary Electrophysiologist: [name], MD    Chief Complaint  Patient presents with  . Atrial Fibrillation     History of Present Illness: Wayne Ortiz is a 70 y.o. male who presents today for electrophysiology evaluation.   He has h/o CAD- recent DES to LCX and DES x2 to RCA at [facility] 2/2016and persistent afib.  He previously failed medical therapy with amiodarone and sotalol.  He underwent afib ablation by me 11/16.  He denies procedure related complications.  He remains in sinus rhythm.  His heart rate increased with recent attempt to stop sotalol and he quickly resumed the medicine.  His SOB appears to be persistent even with sinus rhythm.  His exercise tolerance is very limited.  He says that he is primarily limited by back pain but also SOB.  Today, he denies symptoms of palpitations, chest pain, orthopnea, PND, lower extremity edema, claudication, dizziness, presyncope, syncope, bleeding, or neurologic sequela. The patient is tolerating medications without difficulties and is otherwise without complaint today.    Past Medical History  Diagnosis Date  . Hypothyroidism   . Hypertension   . Gout   . Femur fracture ([place])   . Persistent atrial fibrillation ([place])   . Complication of anesthesia     BECAME COMBATIVE  . Arthritis   . History of gout   . Precancerous skin lesion   . GERD (gastroesophageal reflux disease)     occasional TUMS  . History of kidney stones   . BPH (benign prostatic hypertrophy)   . CAD (coronary artery disease)     a. s/p DES 05/2014 at [facility]  . Dysrhythmia   . Atrial fibrillation [facility])    Past Surgical History  Procedure Laterality Date  . Cervical spine surgery    . Arm fracture surgery Left   . Femur fracture surgery    . Ercp [name] 01/02/2013    Procedure: ENDOSCOPIC RETROGRADE  CHOLANGIOPANCREATOGRAPHY (ERCP);  Surgeon: [name], MD;  [name];  Service: Endoscopy;  Laterality: [name];  . Sphincterotomy [name] 01/02/2013    Procedure: SPHINCTEROTOMY;  Surgeon: [name], MD;  Location: [name];  Service: Endoscopy;  Laterality: [name];  Stone Extraction  . Cholecystectomy [name] 01/03/2013    Procedure: LAPAROSCOPIC CHOLECYSTECTOMY;  Surgeon: [name], MD;  Location: [name];  Service: General;  Laterality: [name];  . Liver biopsy [name] 01/03/2013    Procedure: LIVER BIOPSY;  Surgeon: [name], MD;  Location: [name];  Service: General;  Laterality: [name];  . Cataracts      EXCISION  . Total knee arthroplasty Right 05/30/2013    Procedure: RIGHT TOTAL KNEE ARTHROPLASTY;  Surgeon: [name], MD;  [name];  Service: Orthopedics;  Laterality: Right;  . Cardioversion [name] 12/09/2014    Procedure: CARDIOVERSION;  Surgeon: [name], MD;  Location: [place];  Service: Cardiovascular;  Laterality: [name];  . Electrophysiologic study [name] 03/15/2015    Procedure: Atrial Fibrillation Ablation;  Surgeon: [name], MD;  Location: [place] CV [facility];  Service: Cardiovascular;  Laterality: [name];     Current Outpatient Prescriptions  Medication Sig Dispense Refill  . allopurinol (ZYLOPRIM) 300 MG tablet Take 300 mg by mouth every morning.     [name] apixaban (ELIQUIS) 5 MG TABS tablet Take 1 tablet (5 mg  total) by mouth 2 (two) times daily. 60 tablet 6  . furosemide (LASIX) 40 MG tablet Take 40 mg by mouth daily.    [name] HYDROcodone-acetaminophen (NORCO) 7.5-325 MG per tablet Take 1-2 tablets by mouth every 4 (four) hours as needed for moderate pain. For pain 100 tablet 0  . nitroGLYCERIN (NITROSTAT) 0.4 MG SL tablet Place 0.4 mg under the tongue every 5 (five) minutes x 3 doses as needed for chest pain.     . sotalol (BETAPACE) 120 MG tablet TAKE 1 TABLET(120 MG) BY MOUTH EVERY 12 HOURS 180 tablet 0  . SYNTHROID 150 MCG tablet Take 150 mg by mouth daily  0  .  tamsulosin (FLOMAX) 0.4 MG CAPS capsule Take 0.4 mg by mouth every morning.     No current facility-administered medications for this visit.    Allergies:   Review of patient's allergies indicates no known allergies.   Social History:  The patient  reports that he quit smoking about 42 years ago. He has never used smokeless tobacco. He reports that he drinks alcohol. He reports that he does not use illicit drugs.   Family History:  The patient's  family history includes Kidney Stones in his mother.    ROS:  Please see the history of present illness.   All other systems are reviewed and negative.    PHYSICAL EXAM: VS:  BP 138/90 mmHg  Pulse 51  Ht 5\' 11"  (1.803 m)  Wt 277 lb 6.4 oz (125.828 kg)  BMI 38.71 kg/m2 , BMI Body mass index is 38.71 kg/(m^2). GEN: overweight, in no acute distress HEENT: normal Neck: no JVD, carotid bruits, or masses Cardiac: bradycardic regular rhythm no murmurs, rubs, or gallops,no edema  Respiratory:  clear to auscultation bilaterally, normal work of breathing GI: soft, nontender, nondistended, + BS MS: no deformity or atrophy Skin: warm and dry  Neuro:  Strength and sensation are intact Psych: euthymic mood, full affect  EKG:  EKG is ordered today. The ekg ordered today shows sinus rhythm 51 bpm, otherwise normal ekg   Recent Labs: 08/08/2014: ALT 30 09/11/2014: B Natriuretic Peptide 178.0* 12/01/2014: TSH 0.314* 12/07/2014: Magnesium 2.0 03/08/2015: Hemoglobin 16.7; Platelets 152 04/12/2015: BUN 19; Creatinine, Ser 1.10; Potassium 4.6; Sodium 141    Lipid Panel     Component Value Date/Time   CHOL 164 08/08/2014 0711   TRIG 104 08/08/2014 0711   HDL 41 08/08/2014 0711   CHOLHDL 4.0 08/08/2014 0711   VLDL 21 08/08/2014 0711   LDLCALC 102* 08/08/2014 0711     Wt Readings from Last 3 Encounters:  06/20/15 277 lb 6.4 oz (125.828 kg)  04/12/15 277 lb 3.2 oz (125.737 kg)  03/19/15 282 lb 3.2 oz (128.005 kg)      ASSESSMENT AND  PLAN:  1.  Persistent atrial fibrillation Maintaining sinus rhythm post ablation Stop sotalol If heart rate increases or there is concern for afib, he is instructed to have an ekg at the AF clinic Continue life long anticoagulation  2. SOB SOB is not improved despite sinus rhythm.  Suggests that AF is not the cause. I think he would do much better with lifestyle modification Dr Harl Bowie to follow-up on CAD.  3. Morbid obesity I have reviewed the patients BMI and decreased success rates with ablation at length today.  Weight loss is strongly advised.  I think that this will help his SOB more than any other intervention.   4. HTN Stable No change required today  5. preop Ok  to proceed with back surgery from EP standpoint.  Would hold eliquis 48  Hours prior to surgery and resume when able post op.  Current medicines are reviewed at length with the patient today.   The patient does not have concerns regarding his medicines.  The following changes were made today:  None  Follow-up with Dr Harl Bowie for CAD/ SOB in near future Return to see Butch Penny in AF clinic in 3 months  Signed, [name], MD  06/20/2015 12:30 PM     [place] [address] [place] [address] [phone] (office) [phone] (fax)

## 2015-06-20 NOTE — Patient Instructions (Addendum)
  Medication Instructions:  Your physician has recommended you make the following change in your medication:  1) Stop Sotalol   Labwork: None ordered   Testing/Procedures: None ordered   Follow-Up: Your physician recommends that you schedule a follow-up appointment in: 4-6 weeks with Dr Harl Bowie and 3 months with Roderic Palau, NP   Any Other Special Instructions Will Be Listed Below (If Applicable).     If you need a refill on your cardiac medications before your next appointment, please call your pharmacy.

## 2015-06-25 DIAGNOSIS — I10 Essential (primary) hypertension: Secondary | ICD-10-CM | POA: Diagnosis not present

## 2015-06-25 DIAGNOSIS — M4726 Other spondylosis with radiculopathy, lumbar region: Secondary | ICD-10-CM | POA: Diagnosis not present

## 2015-06-25 DIAGNOSIS — Z6841 Body Mass Index (BMI) 40.0 and over, adult: Secondary | ICD-10-CM | POA: Diagnosis not present

## 2015-06-26 ENCOUNTER — Other Ambulatory Visit: Payer: Self-pay | Admitting: Pediatrics

## 2015-06-26 ENCOUNTER — Other Ambulatory Visit (HOSPITAL_COMMUNITY): Payer: Self-pay | Admitting: Neurosurgery

## 2015-06-26 DIAGNOSIS — M4726 Other spondylosis with radiculopathy, lumbar region: Secondary | ICD-10-CM

## 2015-06-26 DIAGNOSIS — E039 Hypothyroidism, unspecified: Secondary | ICD-10-CM | POA: Diagnosis not present

## 2015-07-03 DIAGNOSIS — R0600 Dyspnea, unspecified: Secondary | ICD-10-CM | POA: Diagnosis not present

## 2015-07-03 DIAGNOSIS — E039 Hypothyroidism, unspecified: Secondary | ICD-10-CM | POA: Diagnosis not present

## 2015-07-03 DIAGNOSIS — I4891 Unspecified atrial fibrillation: Secondary | ICD-10-CM | POA: Diagnosis not present

## 2015-07-03 DIAGNOSIS — I2510 Atherosclerotic heart disease of native coronary artery without angina pectoris: Secondary | ICD-10-CM | POA: Diagnosis not present

## 2015-07-09 ENCOUNTER — Ambulatory Visit (HOSPITAL_COMMUNITY)
Admission: RE | Admit: 2015-07-09 | Discharge: 2015-07-09 | Disposition: A | Discharge status: Home / Self Care | Payer: Medicare HMO | Source: Ambulatory Visit | Attending: Neurosurgery | Admitting: Neurosurgery

## 2015-07-09 DIAGNOSIS — M5126 Other intervertebral disc displacement, lumbar region: Secondary | ICD-10-CM | POA: Diagnosis not present

## 2015-07-09 DIAGNOSIS — M4807 Spinal stenosis, lumbosacral region: Secondary | ICD-10-CM | POA: Diagnosis not present

## 2015-07-09 DIAGNOSIS — Z981 Arthrodesis status: Secondary | ICD-10-CM | POA: Insufficient documentation

## 2015-07-09 DIAGNOSIS — M4726 Other spondylosis with radiculopathy, lumbar region: Secondary | ICD-10-CM | POA: Diagnosis not present

## 2015-07-12 DIAGNOSIS — M4726 Other spondylosis with radiculopathy, lumbar region: Secondary | ICD-10-CM | POA: Diagnosis not present

## 2015-07-12 DIAGNOSIS — M4317 Spondylolisthesis, lumbosacral region: Secondary | ICD-10-CM | POA: Diagnosis not present

## 2015-07-13 ENCOUNTER — Other Ambulatory Visit: Payer: Self-pay | Admitting: Cardiology

## 2015-07-13 MED ORDER — APIXABAN 5 MG PO TABS: 5.0000 mg | ORAL_TABLET | Freq: Two times a day (BID) | ORAL | Status: DC

## 2015-07-13 NOTE — Telephone Encounter (Signed)
Refill sent to [facility]  

## 2015-07-13 NOTE — Telephone Encounter (Signed)
Pt called stating he's running out of his Eliquis

## 2015-07-16 ENCOUNTER — Telehealth: Payer: Self-pay

## 2015-07-16 ENCOUNTER — Telehealth (HOSPITAL_COMMUNITY): Payer: Self-pay | Admitting: *Deleted

## 2015-07-16 NOTE — Telephone Encounter (Signed)
Patient states that the Eliquis is really expensive. He is asking if he can go back on the baby aspirin and be taken off of Eliquis.

## 2015-07-16 NOTE — Telephone Encounter (Signed)
Will forward to Dr Branch for advice 

## 2015-07-16 NOTE — Telephone Encounter (Signed)
Pt called in asking if he could discontinue Eliquis due to costing him 100 a month. Reviewed with Roderic Palau NP - CHADVASC score of 3 recommends lifelong use of anticoagulation.  Offered to be switched to coumadin and patient does not wish to do this and patient had GI bleeding with Xarelto. Offered to fill out patient assistance forms for patient - will at appt tomorrow - pt reported heart rates being 90-120s last few weeks.

## 2015-07-16 NOTE — Telephone Encounter (Signed)
Sent message to pcp,will ask pt to fill out form to see if he qualifies for patient assistance

## 2015-07-17 ENCOUNTER — Encounter (HOSPITAL_COMMUNITY): Payer: Self-pay | Admitting: Nurse Practitioner

## 2015-07-17 ENCOUNTER — Ambulatory Visit (HOSPITAL_COMMUNITY)
Admission: RE | Admit: 2015-07-17 | Discharge: 2015-07-17 | Disposition: A | Discharge status: Home / Self Care | Payer: Medicare HMO | Source: Ambulatory Visit | Attending: Nurse Practitioner | Admitting: Nurse Practitioner

## 2015-07-17 VITALS — BP 110/72 | HR 110 | Ht 71.0 in | Wt 274.3 lb

## 2015-07-17 DIAGNOSIS — I4891 Unspecified atrial fibrillation: Secondary | ICD-10-CM | POA: Diagnosis not present

## 2015-07-17 DIAGNOSIS — R000 Tachycardia, unspecified: Secondary | ICD-10-CM

## 2015-07-17 MED ORDER — DILTIAZEM HCL ER COATED BEADS 120 MG PO CP24: 120.0000 mg | ORAL_CAPSULE | Freq: Every day | ORAL | Status: DC

## 2015-07-17 NOTE — Patient Instructions (Addendum)
Your physician has recommended you make the following change in your medication:  1)Stop Lisinopril 2)Cardizem CD 120mg  once a day

## 2015-07-17 NOTE — Progress Notes (Signed)
Patient ID: [name], male   DOB: [birth year], 70 y.o.   MRN: [id number]     Primary Care Physician: Wayne Gravel, MD Referring Physician: Hansel Ortiz   Wayne Ortiz is a 70 y.o. male with a h/o afib and s/p ablation 03/15/15. He had failed amiodarone in Wayne past and most recently had been on sotalol. This drug was stopped in 1/17, when he had his appointment with Dr. Rayann Ortiz, due to bradycardia, and maintaining SR. He called to Wayne office yesterday stating that he has had an elevated heart rate for several weeks. He first saw MD that cares for his thyroid 2 weeks ago  and his synthroid dose was reduced. However, he is still seeing his heart rate elevated. He is in SR today but at 110 bpm. He has been on rate control in Wayne past with Wayne Ortiz, but not for Wayne last year. He has noted some chest Ortiz and shortness of breath recently with exertion, but he contributes this to elevated heart rate. He continues on epixaban for blood thinner without issues with bleeding.  Today, he denies symptoms of palpitations, chest Ortiz, shortness of breath, orthopnea, PND, lower extremity edema, dizziness, presyncope, syncope, or neurologic sequela. Wayne patient is tolerating medications without difficulties and is otherwise without complaint today.   Past Medical History  Diagnosis Date  . Hypothyroidism   . Hypertension   . Gout   . Femur fracture (Wayne Ortiz)   . Persistent atrial fibrillation (Wayne Ortiz)   . Complication of anesthesia     BECAME COMBATIVE  . Arthritis   . History of gout   . Precancerous skin lesion   . GERD (gastroesophageal reflux disease)     occasional TUMS  . History of kidney stones   . BPH (benign prostatic hypertrophy)   . CAD (coronary artery disease)     a. s/p DES 05/2014 at [facility]  . Dysrhythmia   . Atrial fibrillation [facility])    Past Surgical History  Procedure Laterality Date  . Cervical spine surgery    . Arm fracture surgery Left   . Femur fracture surgery    . Ercp [name] 01/02/2013    Procedure: ENDOSCOPIC RETROGRADE CHOLANGIOPANCREATOGRAPHY (ERCP);  Surgeon: Wayne Houston, MD;  Location: [facility];  Service: Endoscopy;  Laterality: [name];  . Sphincterotomy [name] 01/02/2013    Procedure: SPHINCTEROTOMY;  Surgeon: Wayne Houston, MD;  Location: [facility];  Service: Endoscopy;  Laterality: [name];  Stone Extraction  . Cholecystectomy [name] 01/03/2013    Procedure: LAPAROSCOPIC CHOLECYSTECTOMY;  Surgeon: Wayne So, MD;  Location: [facility];  Service: General;  Laterality: [name];  . Liver biopsy [name] 01/03/2013    Procedure: LIVER BIOPSY;  Surgeon: Wayne So, MD;  Location: [facility];  Service: General;  Laterality: [name];  . Cataracts      EXCISION  . Total knee arthroplasty Right 05/30/2013    Procedure: RIGHT TOTAL KNEE ARTHROPLASTY;  Surgeon: Wayne Pole, MD;  Location: [facility];  Service: Orthopedics;  Laterality: Right;  . Cardioversion [name] 12/09/2014    Procedure: CARDIOVERSION;  Surgeon: Wayne Pain, MD;  Location: [place];  Service: Cardiovascular;  Laterality: [name];  . Electrophysiologic study [name] 03/15/2015    Procedure: Atrial Fibrillation Ablation;  Surgeon: Wayne Grayer, MD;  Location: [facility];  Service: Cardiovascular;  Laterality: [name];    Current Outpatient Prescriptions  Medication Sig Dispense Refill  . allopurinol (ZYLOPRIM) 300 MG tablet Take 300 mg by mouth every morning.     [name]  apixaban (ELIQUIS) 5 MG TABS tablet Take 1 tablet (5 mg total) by mouth 2 (two) times daily. 60 tablet 6  . furosemide (LASIX) 40 MG tablet Take 40 mg by mouth daily.    [name] HYDROcodone-acetaminophen (NORCO) 7.5-325 MG per tablet Take 1-2 tablets by mouth every 4 (four) hours as needed for moderate Ortiz. For Ortiz 100 tablet 0  . SYNTHROID 150 MCG tablet Take 125 mcg by mouth daily.  0  . tamsulosin (FLOMAX) 0.4 MG CAPS capsule Take 0.4 mg by mouth every morning.    . diltiazem (Wayne Ortiz CD) 120 MG 24 hr capsule Take 1 capsule (120 mg total) by mouth daily. 30 capsule 6  . nitroGLYCERIN  (NITROSTAT) 0.4 MG SL tablet Place 0.4 mg under Wayne tongue every 5 (five) minutes x 3 doses as needed for chest Ortiz.      No current facility-administered medications for this encounter.    No Known Allergies  Social History   Social History  . Marital Status: Married    Spouse Name: [name]  . Number of Children: [name]  . Years of Education: [name]   Occupational History  . Not on file.   Social History Main Topics  . Smoking status: Former Smoker    Quit date: 05/23/1973  . Smokeless tobacco: Never Used  . Alcohol Use: 0.0 oz/week    0 Standard drinks or equivalent per week     Comment: RARE  . Drug Use: No  . Sexual Activity: Not on file   Other Topics Concern  . Not on file   Social History Narrative    Family History  Problem Relation Age of Onset  . Kidney Stones Mother     ROS- All systems are reviewed and negative except as per Wayne HPI above  Physical Exam: Filed Vitals:   07/17/15 1015  BP: 110/72  Pulse: 110  Height: 5\' 11"  (1.803 m)  Weight: 274 lb 4.8 oz (124.422 kg)    GEN- Wayne patient is well appearing, alert and oriented x 3 today.   Head- normocephalic, atraumatic Eyes-  Sclera clear, conjunctiva pink Ears- hearing intact Oropharynx- clear Neck- supple, no JVP Lymph- no cervical lymphadenopathy Lungs- Clear to ausculation bilaterally, normal work of breathing Heart- Regular, rapid rate and rhythm, no murmurs, rubs or gallops, PMI not laterally displaced GI- soft, NT, ND, + BS Extremities- no clubbing, cyanosis, or edema MS- no significant deformity or atrophy Skin- no rash or lesion Psych- euthymic mood, full affect Neuro- strength and sensation are intact  EKG- Sinus tach at 110 bpm Epic records reviewed  Assessment and Plan: 1. Afib S/p ablation Off sotalol In SR  2. Sinus tach Synthroid recently adjusted to lower dose 2-3 weeks ago, may be driving heart rate Will add Wayne Ortiz 120 mg qd Stop lisinopril for now until can see  effect on BP  F/u in one week  Butch Penny C. Shuntel Fishburn, [facility] [address] [place], [place] [address] [phone]

## 2015-07-20 ENCOUNTER — Ambulatory Visit: Payer: Medicare HMO | Admitting: Adult Health

## 2015-07-24 ENCOUNTER — Encounter (HOSPITAL_COMMUNITY): Payer: Self-pay | Admitting: Nurse Practitioner

## 2015-07-24 ENCOUNTER — Ambulatory Visit (HOSPITAL_COMMUNITY)
Admission: RE | Admit: 2015-07-24 | Discharge: 2015-07-24 | Disposition: A | Discharge status: Home / Self Care | Payer: Medicare HMO | Source: Ambulatory Visit | Attending: Nurse Practitioner | Admitting: Nurse Practitioner

## 2015-07-24 VITALS — BP 108/70 | HR 88 | Ht 71.0 in | Wt 272.0 lb

## 2015-07-24 DIAGNOSIS — Z7901 Long term (current) use of anticoagulants: Secondary | ICD-10-CM | POA: Diagnosis not present

## 2015-07-24 DIAGNOSIS — Z87891 Personal history of nicotine dependence: Secondary | ICD-10-CM | POA: Insufficient documentation

## 2015-07-24 DIAGNOSIS — E039 Hypothyroidism, unspecified: Secondary | ICD-10-CM | POA: Insufficient documentation

## 2015-07-24 DIAGNOSIS — M1990 Unspecified osteoarthritis, unspecified site: Secondary | ICD-10-CM | POA: Diagnosis not present

## 2015-07-24 DIAGNOSIS — Z79899 Other long term (current) drug therapy: Secondary | ICD-10-CM | POA: Diagnosis not present

## 2015-07-24 DIAGNOSIS — R000 Tachycardia, unspecified: Secondary | ICD-10-CM | POA: Diagnosis not present

## 2015-07-24 DIAGNOSIS — R21 Rash and other nonspecific skin eruption: Secondary | ICD-10-CM | POA: Insufficient documentation

## 2015-07-24 DIAGNOSIS — I4891 Unspecified atrial fibrillation: Secondary | ICD-10-CM | POA: Insufficient documentation

## 2015-07-24 DIAGNOSIS — N400 Enlarged prostate without lower urinary tract symptoms: Secondary | ICD-10-CM | POA: Insufficient documentation

## 2015-07-24 DIAGNOSIS — I10 Essential (primary) hypertension: Secondary | ICD-10-CM | POA: Insufficient documentation

## 2015-07-24 DIAGNOSIS — Z955 Presence of coronary angioplasty implant and graft: Secondary | ICD-10-CM | POA: Diagnosis not present

## 2015-07-24 DIAGNOSIS — K219 Gastro-esophageal reflux disease without esophagitis: Secondary | ICD-10-CM | POA: Diagnosis not present

## 2015-07-24 DIAGNOSIS — I2510 Atherosclerotic heart disease of native coronary artery without angina pectoris: Secondary | ICD-10-CM | POA: Diagnosis not present

## 2015-07-24 DIAGNOSIS — M109 Gout, unspecified: Secondary | ICD-10-CM | POA: Diagnosis not present

## 2015-07-24 NOTE — Progress Notes (Signed)
Patient ID: Wayne Ortiz, male   DOB: [birth year], 70 y.o.   MRN: [id number]     Primary Care Physician: [name], MD Referring Physician: [name]   Wayne Ortiz is a 70 y.o. male with a h/o afib and s/p ablation 03/15/15. He had failed amiodarone in the past and most recently had been on sotalol. This drug was stopped in 1/17, when he had his appointment with Dr. [name], due to bradycardia, and maintaining SR. He called to the office about one week ago stating that he has had an elevated heart rate for several weeks. He first saw MD that cares for his thyroid 2 weeks ago  and his synthroid dose was reduced. However, he is still seeing his heart rate elevated. He was in  SR today but at 110 bpm. He has been on rate control in the past with cardizem, but not for the last year. He has noted some chest pain and shortness of breath recently with exertion, but he contributes this to elevated heart rate. He continues on epixaban for blood thinner without issues with bleeding.  He returns today 3/28 for f/u after starting cardizem daily,heart rate is now slower in the 80's. He feels better with less shortness of breath with exertion although this is somewhat chronic. He has an itchy rash today that he has had intermittently and does not appear to be related to cardizem. He has taken cardizem in the past without issues with itching.   Today, he denies symptoms of palpitations, chest pain, shortness of breath, orthopnea, PND, lower extremity edema, dizziness, presyncope, syncope, or neurologic sequela. The patient is tolerating medications without difficulties and is otherwise without complaint today.   Past Medical History  Diagnosis Date  . Hypothyroidism   . Hypertension   . Gout   . Femur fracture ([place])   . Persistent atrial fibrillation ([place])   . Complication of anesthesia     BECAME COMBATIVE  . Arthritis   . History of gout   . Precancerous skin lesion   . GERD (gastroesophageal reflux  disease)     occasional TUMS  . History of kidney stones   . BPH (benign prostatic hypertrophy)   . CAD (coronary artery disease)     a. s/p DES 05/2014 at [facility]  . Dysrhythmia   . Atrial fibrillation [facility])    Past Surgical History  Procedure Laterality Date  . Cervical spine surgery    . Arm fracture surgery Left   . Femur fracture surgery    . Ercp [name] 01/02/2013    Procedure: ENDOSCOPIC RETROGRADE CHOLANGIOPANCREATOGRAPHY (ERCP);  Surgeon: [name], MD;  [name];  Service: Endoscopy;  Laterality: [name];  . Sphincterotomy [name] 01/02/2013    Procedure: SPHINCTEROTOMY;  Surgeon: [name], MD;  Location: [name];  Service: Endoscopy;  Laterality: [name];  Stone Extraction  . Cholecystectomy [name] 01/03/2013    Procedure: LAPAROSCOPIC CHOLECYSTECTOMY;  Surgeon: [name], MD;  [name];  Service: General;  Laterality: [name];  . Liver biopsy [name] 01/03/2013    Procedure: LIVER BIOPSY;  Surgeon: [name], MD;  Location[name];  Service: General;  Laterality: [name];  . Cataracts      EXCISION  . Total knee arthroplasty Right 05/30/2013    Procedure: RIGHT TOTAL KNEE ARTHROPLASTY;  Surgeon: [name], MD;  [name];  Service: Orthopedics;  Laterality: Right;  . Cardioversion [name] 12/09/2014    Procedure: CARDIOVERSION;  Surgeon: Thana Farr  Marlou Porch, MD;  Location: [place];  Service: Cardiovascular;  Laterality: [name];  . Electrophysiologic study [name] 03/15/2015    Procedure: Atrial Fibrillation Ablation;  Surgeon: [name], MD;  Location: [place] [facility];  Service: Cardiovascular;  Laterality: [name];    Current Outpatient Prescriptions  Medication Sig Dispense Refill  . allopurinol (ZYLOPRIM) 300 MG tablet Take 300 mg by mouth every morning.     [name] apixaban (ELIQUIS) 5 MG TABS tablet Take 1 tablet (5 mg total) by mouth 2 (two) times daily. 60 tablet 6  . diltiazem (CARDIZEM CD) 120 MG 24 hr capsule Take 1 capsule (120 mg total) by mouth daily. 30 capsule 6  .  furosemide (LASIX) 40 MG tablet Take 40 mg by mouth daily.    [name] HYDROcodone-acetaminophen (NORCO) 7.5-325 MG per tablet Take 1-2 tablets by mouth every 4 (four) hours as needed for moderate pain. For pain 100 tablet 0  . SYNTHROID 150 MCG tablet Take 125 mcg by mouth daily.  0  . tamsulosin (FLOMAX) 0.4 MG CAPS capsule Take 0.4 mg by mouth every morning.    . nitroGLYCERIN (NITROSTAT) 0.4 MG SL tablet Place 0.4 mg under the tongue every 5 (five) minutes x 3 doses as needed for chest pain.      No current facility-administered medications for this encounter.    No Known Allergies  Social History   Social History  . Marital Status: Married    Spouse Name: [name]  . Number of Children: [name]  . Years of Education: [name]   Occupational History  . Not on file.   Social History Main Topics  . Smoking status: Former Smoker    Quit date: 05/23/1973  . Smokeless tobacco: Never Used  . Alcohol Use: 0.0 oz/week    0 Standard drinks or equivalent per week     Comment: RARE  . Drug Use: No  . Sexual Activity: Not on file   Other Topics Concern  . Not on file   Social History Narrative    Family History  Problem Relation Age of Onset  . Kidney Stones Mother     ROS- All systems are reviewed and negative except as per the HPI above  Physical Exam: Filed Vitals:   07/24/15 1130  BP: 108/70  Pulse: 88  Height: 5\' 11"  (1.803 m)  Weight: 272 lb (123.378 kg)    GEN- The patient is well appearing, alert and oriented x 3 today.   Head- normocephalic, atraumatic Eyes-  Sclera clear, conjunctiva pink Ears- hearing intact Oropharynx- clear Neck- supple, no JVP Lymph- no cervical lymphadenopathy Lungs- Clear to ausculation bilaterally, normal work of breathing Heart- Regular, rapid rate and rhythm, no murmurs, rubs or gallops, PMI not laterally displaced GI- soft, NT, ND, + BS Extremities- no clubbing, cyanosis, or edema MS- no significant deformity or atrophy Skin- no rash or  lesion Psych- euthymic mood, full affect Neuro- strength and sensation are intact  EKG- Sinus rhythm with PAC's at 88 bpm, pr int 176 ms, qrs int 80 ms, qtc 433 ms Epic records reviewed  Assessment and Plan: 1. Afib S/p ablation Off sotalol In SR  2. Sinus tach Synthroid recently adjusted to lower dose 2-3 weeks ago, may be driving heart rate Added cardizem 120 mg qd which have helped with keeping heart rate slower Stay off lisinopril, pt had added himself before cardizem was added to help control BP. BP well controlled today  3. Rash Etiology unknown but precedes cardizem Encouraged to see dermatologist  F/u as scheduled with Dr.Allred in June Afib clinic as needed  Geroge Baseman. Shakina Choy, [facility] [address] [place], [place] [address] [phone]

## 2015-07-30 DIAGNOSIS — M5416 Radiculopathy, lumbar region: Secondary | ICD-10-CM | POA: Diagnosis not present

## 2015-07-30 DIAGNOSIS — M4317 Spondylolisthesis, lumbosacral region: Secondary | ICD-10-CM | POA: Diagnosis not present

## 2015-07-30 DIAGNOSIS — M4726 Other spondylosis with radiculopathy, lumbar region: Secondary | ICD-10-CM | POA: Diagnosis not present

## 2015-08-02 ENCOUNTER — Ambulatory Visit: Payer: Medicare HMO | Admitting: Cardiology

## 2015-08-08 DIAGNOSIS — R208 Other disturbances of skin sensation: Secondary | ICD-10-CM | POA: Diagnosis not present

## 2015-08-08 DIAGNOSIS — Z08 Encounter for follow-up examination after completed treatment for malignant neoplasm: Secondary | ICD-10-CM | POA: Diagnosis not present

## 2015-08-08 DIAGNOSIS — L821 Other seborrheic keratosis: Secondary | ICD-10-CM | POA: Diagnosis not present

## 2015-08-08 DIAGNOSIS — D224 Melanocytic nevi of scalp and neck: Secondary | ICD-10-CM | POA: Diagnosis not present

## 2015-08-08 DIAGNOSIS — Z85828 Personal history of other malignant neoplasm of skin: Secondary | ICD-10-CM | POA: Diagnosis not present

## 2015-08-08 DIAGNOSIS — L538 Other specified erythematous conditions: Secondary | ICD-10-CM | POA: Diagnosis not present

## 2015-08-08 DIAGNOSIS — L578 Other skin changes due to chronic exposure to nonionizing radiation: Secondary | ICD-10-CM | POA: Diagnosis not present

## 2015-08-08 DIAGNOSIS — L820 Inflamed seborrheic keratosis: Secondary | ICD-10-CM | POA: Diagnosis not present

## 2015-08-08 DIAGNOSIS — Z789 Other specified health status: Secondary | ICD-10-CM | POA: Diagnosis not present

## 2015-08-19 ENCOUNTER — Other Ambulatory Visit (HOSPITAL_COMMUNITY): Payer: Self-pay | Admitting: Nurse Practitioner

## 2015-09-10 ENCOUNTER — Other Ambulatory Visit (HOSPITAL_COMMUNITY): Payer: Self-pay | Admitting: *Deleted

## 2015-09-10 MED ORDER — FUROSEMIDE 40 MG PO TABS: 40.0000 mg | ORAL_TABLET | Freq: Every day | ORAL | Status: DC

## 2015-09-11 DIAGNOSIS — M4726 Other spondylosis with radiculopathy, lumbar region: Secondary | ICD-10-CM | POA: Diagnosis not present

## 2015-09-11 DIAGNOSIS — M4317 Spondylolisthesis, lumbosacral region: Secondary | ICD-10-CM | POA: Diagnosis not present

## 2015-09-11 DIAGNOSIS — Z6841 Body Mass Index (BMI) 40.0 and over, adult: Secondary | ICD-10-CM | POA: Diagnosis not present

## 2015-09-17 DIAGNOSIS — M4317 Spondylolisthesis, lumbosacral region: Secondary | ICD-10-CM | POA: Diagnosis not present

## 2015-09-17 DIAGNOSIS — M5416 Radiculopathy, lumbar region: Secondary | ICD-10-CM | POA: Diagnosis not present

## 2015-09-17 DIAGNOSIS — M4726 Other spondylosis with radiculopathy, lumbar region: Secondary | ICD-10-CM | POA: Diagnosis not present

## 2015-10-01 ENCOUNTER — Telehealth: Payer: Self-pay | Admitting: Cardiology

## 2015-10-01 NOTE — Telephone Encounter (Signed)
Pt called stating he's needing assistance to get his Eliquis

## 2015-10-01 NOTE — Telephone Encounter (Signed)
Pt had not qualified for assistance because he had not spent enough out of pocket on his medications.He believes now he has met out of pocket year to date expenses. He is going to get copy from pharmacy and resubmit.He needs fu with Dr [name] and he will either take paper work to that visit or drop off at our office.

## 2015-10-05 ENCOUNTER — Ambulatory Visit (HOSPITAL_COMMUNITY)
Admission: RE | Admit: 2015-10-05 | Discharge: 2015-10-05 | Disposition: A | Discharge status: Home / Self Care | Payer: Medicare HMO | Source: Ambulatory Visit | Attending: Nurse Practitioner | Admitting: Nurse Practitioner

## 2015-10-05 ENCOUNTER — Encounter (HOSPITAL_COMMUNITY): Payer: Self-pay | Admitting: Nurse Practitioner

## 2015-10-05 VITALS — BP 118/72 | HR 110 | Ht 71.0 in | Wt 264.4 lb

## 2015-10-05 DIAGNOSIS — M109 Gout, unspecified: Secondary | ICD-10-CM | POA: Diagnosis not present

## 2015-10-05 DIAGNOSIS — I2510 Atherosclerotic heart disease of native coronary artery without angina pectoris: Secondary | ICD-10-CM | POA: Insufficient documentation

## 2015-10-05 DIAGNOSIS — Z87891 Personal history of nicotine dependence: Secondary | ICD-10-CM | POA: Insufficient documentation

## 2015-10-05 DIAGNOSIS — Z96651 Presence of right artificial knee joint: Secondary | ICD-10-CM | POA: Diagnosis not present

## 2015-10-05 DIAGNOSIS — K219 Gastro-esophageal reflux disease without esophagitis: Secondary | ICD-10-CM | POA: Diagnosis not present

## 2015-10-05 DIAGNOSIS — R000 Tachycardia, unspecified: Secondary | ICD-10-CM | POA: Diagnosis not present

## 2015-10-05 DIAGNOSIS — I480 Paroxysmal atrial fibrillation: Secondary | ICD-10-CM | POA: Diagnosis not present

## 2015-10-05 DIAGNOSIS — Z7901 Long term (current) use of anticoagulants: Secondary | ICD-10-CM | POA: Diagnosis not present

## 2015-10-05 DIAGNOSIS — I10 Essential (primary) hypertension: Secondary | ICD-10-CM | POA: Diagnosis not present

## 2015-10-05 DIAGNOSIS — E039 Hypothyroidism, unspecified: Secondary | ICD-10-CM | POA: Insufficient documentation

## 2015-10-05 DIAGNOSIS — M1990 Unspecified osteoarthritis, unspecified site: Secondary | ICD-10-CM | POA: Insufficient documentation

## 2015-10-05 DIAGNOSIS — Z87442 Personal history of urinary calculi: Secondary | ICD-10-CM | POA: Diagnosis not present

## 2015-10-05 DIAGNOSIS — N400 Enlarged prostate without lower urinary tract symptoms: Secondary | ICD-10-CM | POA: Diagnosis not present

## 2015-10-05 MED ORDER — DILTIAZEM HCL ER COATED BEADS 120 MG PO CP24: 120.0000 mg | ORAL_CAPSULE | Freq: Two times a day (BID) | ORAL | Status: DC

## 2015-10-05 NOTE — Progress Notes (Addendum)
Patient ID: Wayne Ortiz, male   DOB: 04-24-1946, 70 y.o.   MRN: [id number]     Primary Care Physician: [name], MD Referring Physician: [name]   [name] is a 70 y.o. male with a h/o afib and s/p ablation 03/15/15. He had failed amiodarone in the past and most recently had been on sotalol. This drug was stopped in 1/17, when he had his appointment with Dr. [name], due to bradycardia, and maintaining SR. He called to the office about one week ago stating that he has had an elevated heart rate for several weeks. He first saw MD that cares for his thyroid 2 weeks ago  and his synthroid dose was reduced. However, he is still seeing his heart rate elevated. He was in  SR today but at 110 bpm. He has been on rate control in the past with cardizem, but not for the last year. He has noted some chest pain and shortness of breath recently with exertion, but he contributes this to elevated heart rate. He continues on epixaban for blood thinner without issues with bleeding.  He returns today 3/28 for f/u after starting cardizem daily,heart rate is now slower in the 80's. He feels better with less shortness of breath with exertion although this is somewhat chronic. He has an itchy rash today that he has had intermittently and does not appear to be related to cardizem. He has taken cardizem in the past without issues with itching.   Pt being seen in the clinic 6/9 for Afib with RVR. It started yesterday after eating a fish fry lunch which was very greasy. He developed fast heart rate with chest discomfort and vomiting x 1.  He currently does not have any chest d/c and feels better today but still has RVR. He continues on apixaban but may have missed a few doses over the last few weeks.Continues on cardizem 120 mg qd.   Today, he denies symptoms of  chest pain, orthopnea, PND, lower extremity edema, dizziness, presyncope, syncope, or neurologic sequela.Positive for shortness of breath, fatigue. The patient  is tolerating medications without difficulties and is otherwise without complaint today.   Past Medical History  Diagnosis Date  . Hypothyroidism   . Hypertension   . Gout   . Femur fracture ([place])   . Persistent atrial fibrillation ([place])   . Complication of anesthesia     BECAME COMBATIVE  . Arthritis   . History of gout   . Precancerous skin lesion   . GERD (gastroesophageal reflux disease)     occasional TUMS  . History of kidney stones   . BPH (benign prostatic hypertrophy)   . CAD (coronary artery disease)     a. s/[facility]/2014 at [facility]  . Dysrhythmia   . Atrial fibrillation [facility])    Past Surgical History  Procedure Laterality Date  . Cervical spine surgery    . Arm fracture surgery Left   . Femur fracture surgery    . Ercp [name] 01/02/2013    Procedure: ENDOSCOPIC RETROGRADE CHOLANGIOPANCREATOGRAPHY (ERCP);  Surgeon: [name], MD;  [name];  Service: Endoscopy;  Laterality: [name];  . Sphincterotomy [name] 01/02/2013    Procedure: SPHINCTEROTOMY;  Surgeon: [name], MD;  Location: [name];  Service: Endoscopy;  Laterality: [name];  Stone Extraction  . Cholecystectomy [name] 01/03/2013    Procedure: LAPAROSCOPIC CHOLECYSTECTOMY;  Surgeon: [name], MD;  Location: [name];  Service: General;  Laterality: [name];  . Liver biopsy  [name] 01/03/2013    Procedure: LIVER BIOPSY;  Surgeon: [name], MD;  Location: [name];  Service: General;  Laterality: [name];  . Cataracts      EXCISION  . Total knee arthroplasty Right 05/30/2013    Procedure: RIGHT TOTAL KNEE ARTHROPLASTY;  Surgeon: [name], MD;  [name];  Service: Orthopedics;  Laterality: Right;  . Cardioversion [name] 12/09/2014    Procedure: CARDIOVERSION;  Surgeon: [name], MD;  Location: [place];  Service: Cardiovascular;  Laterality: [name];  . Electrophysiologic study [name] 03/15/2015    Procedure: Atrial Fibrillation Ablation;  Surgeon: [name], MD;  Location: [place] [facility];  Service:  Cardiovascular;  Laterality: [name];    Current Outpatient Prescriptions  Medication Sig Dispense Refill  . allopurinol (ZYLOPRIM) 300 MG tablet Take 300 mg by mouth every morning.     [name] apixaban (ELIQUIS) 5 MG TABS tablet Take 1 tablet (5 mg total) by mouth 2 (two) times daily. 60 tablet 6  . diltiazem (CARDIZEM CD) 120 MG 24 hr capsule Take 1 capsule (120 mg total) by mouth 2 (two) times daily. 30 capsule 6  . furosemide (LASIX) 40 MG tablet Take 1 tablet (40 mg total) by mouth daily. 90 tablet 3  . HYDROcodone-acetaminophen (NORCO) 7.5-325 MG per tablet Take 1-2 tablets by mouth every 4 (four) hours as needed for moderate pain. For pain 100 tablet 0  . SYNTHROID 150 MCG tablet Take 125 mcg by mouth daily.  0  . tamsulosin (FLOMAX) 0.4 MG CAPS capsule Take 0.4 mg by mouth every morning.    . nitroGLYCERIN (NITROSTAT) 0.4 MG SL tablet Place 0.4 mg under the tongue every 5 (five) minutes x 3 doses as needed for chest pain.      No current facility-administered medications for this encounter.    No Known Allergies  Social History   Social History  . Marital Status: Married    Spouse Name: [name]  . Number of Children: [name]  . Years of Education: [name]   Occupational History  . Not on file.   Social History Main Topics  . Smoking status: Former Smoker    Quit date: 05/23/1973  . Smokeless tobacco: Never Used  . Alcohol Use: 0.0 oz/week    0 Standard drinks or equivalent per week     Comment: RARE  . Drug Use: No  . Sexual Activity: Not on file   Other Topics Concern  . Not on file   Social History Narrative    Family History  Problem Relation Age of Onset  . Kidney Stones Mother     ROS- All systems are reviewed and negative except as per the HPI above  Physical Exam: Filed Vitals:   10/05/15 1141 10/05/15 1216  BP: 118/72   Pulse: 153 110  Height: 5\' 11"  (1.803 m)   Weight: 264 lb 6.4 oz (119.931 kg)     GEN- The patient is well appearing, alert and oriented x 3  today.   Head- normocephalic, atraumatic Eyes-  Sclera clear, conjunctiva pink Ears- hearing intact Oropharynx- clear Neck- supple, no JVP Lymph- no cervical lymphadenopathy Lungs- Clear to ausculation bilaterally, normal work of breathing Heart-Irregular, rapid rate and rhythm, no murmurs, rubs or gallops, PMI not laterally displaced GI- soft, NT, ND, + BS Extremities- no clubbing, cyanosis, or edema MS- no significant deformity or atrophy Skin- no rash or lesion Psych- euthymic mood, full affect Neuro- strength and sensation are intact  EKG-Afib with RVR at  153 bpm Epic records reviewed  Assessment and Plan: 1.Symptomatic PAF S/p ablation Off sotalol Increase cardizem to 120 mg bid Continue apixaban  F/u on Monday, advised to go to ER over the weekend if chest pain or worseing shortness of breath  Butch Penny C. Schon Zeiders, [facility] [address] [place], [place] [address] [phone]

## 2015-10-08 ENCOUNTER — Encounter (HOSPITAL_COMMUNITY): Payer: Self-pay | Admitting: Nurse Practitioner

## 2015-10-08 ENCOUNTER — Ambulatory Visit (HOSPITAL_COMMUNITY)
Admission: RE | Admit: 2015-10-08 | Discharge: 2015-10-08 | Disposition: A | Discharge status: Home / Self Care | Payer: Medicare HMO | Source: Ambulatory Visit | Attending: Nurse Practitioner | Admitting: Nurse Practitioner

## 2015-10-08 VITALS — BP 126/82 | HR 102 | Ht 71.0 in | Wt 263.4 lb

## 2015-10-08 DIAGNOSIS — K219 Gastro-esophageal reflux disease without esophagitis: Secondary | ICD-10-CM | POA: Diagnosis not present

## 2015-10-08 DIAGNOSIS — M1990 Unspecified osteoarthritis, unspecified site: Secondary | ICD-10-CM | POA: Insufficient documentation

## 2015-10-08 DIAGNOSIS — I2510 Atherosclerotic heart disease of native coronary artery without angina pectoris: Secondary | ICD-10-CM | POA: Diagnosis not present

## 2015-10-08 DIAGNOSIS — Z841 Family history of disorders of kidney and ureter: Secondary | ICD-10-CM | POA: Insufficient documentation

## 2015-10-08 DIAGNOSIS — Z79899 Other long term (current) drug therapy: Secondary | ICD-10-CM | POA: Diagnosis not present

## 2015-10-08 DIAGNOSIS — Z87442 Personal history of urinary calculi: Secondary | ICD-10-CM | POA: Insufficient documentation

## 2015-10-08 DIAGNOSIS — R000 Tachycardia, unspecified: Secondary | ICD-10-CM

## 2015-10-08 DIAGNOSIS — I4891 Unspecified atrial fibrillation: Secondary | ICD-10-CM | POA: Diagnosis present

## 2015-10-08 DIAGNOSIS — Z96651 Presence of right artificial knee joint: Secondary | ICD-10-CM | POA: Insufficient documentation

## 2015-10-08 DIAGNOSIS — Z7901 Long term (current) use of anticoagulants: Secondary | ICD-10-CM | POA: Insufficient documentation

## 2015-10-08 DIAGNOSIS — M109 Gout, unspecified: Secondary | ICD-10-CM | POA: Insufficient documentation

## 2015-10-08 DIAGNOSIS — Z87891 Personal history of nicotine dependence: Secondary | ICD-10-CM | POA: Diagnosis not present

## 2015-10-08 DIAGNOSIS — I10 Essential (primary) hypertension: Secondary | ICD-10-CM | POA: Diagnosis not present

## 2015-10-08 DIAGNOSIS — N401 Enlarged prostate with lower urinary tract symptoms: Secondary | ICD-10-CM | POA: Diagnosis not present

## 2015-10-08 DIAGNOSIS — Z9889 Other specified postprocedural states: Secondary | ICD-10-CM | POA: Insufficient documentation

## 2015-10-08 DIAGNOSIS — R3911 Hesitancy of micturition: Secondary | ICD-10-CM | POA: Insufficient documentation

## 2015-10-08 DIAGNOSIS — E039 Hypothyroidism, unspecified: Secondary | ICD-10-CM | POA: Diagnosis not present

## 2015-10-08 DIAGNOSIS — Z9049 Acquired absence of other specified parts of digestive tract: Secondary | ICD-10-CM | POA: Insufficient documentation

## 2015-10-08 DIAGNOSIS — I480 Paroxysmal atrial fibrillation: Secondary | ICD-10-CM | POA: Insufficient documentation

## 2015-10-08 NOTE — Progress Notes (Signed)
Patient ID: Wayne Ortiz, male   DOB: 10-12-1945, 70 y.o.   MRN: [id number]     Primary Care Physician: [name], MD Referring Physician: [name]   ECHO TOPP is a 70 y.o. male with a h/o afib and s/p ablation 03/15/15. He had failed amiodarone in the past and most recently had been on sotalol. This drug was stopped in 1/17, when he had his appointment with Dr. [name], due to bradycardia, and maintaining SR. He called to the office about one week ago stating that he has had an elevated heart rate for several weeks. He first saw MD that cares for his thyroid 2 weeks ago  and his synthroid dose was reduced. However, he is still seeing his heart rate elevated. He was in  SR today but at 110 bpm. He has been on rate control in the past with cardizem, but not for the last year. He has noted some chest pain and shortness of breath recently with exertion, but he contributes this to elevated heart rate. He continues on epixaban for blood thinner without issues with bleeding.  He returns today 3/28 for f/u after starting cardizem daily,heart rate is now slower in the 80's. He feels better with less shortness of breath with exertion although this is somewhat chronic. He has an itchy rash today that he has had intermittently and does not appear to be related to cardizem. He has taken cardizem in the past without issues with itching.   Pt being seen in the clinic 6/9 for Afib with RVR. It started yesterday after eating a fish fry lunch which was very greasy. He developed fast heart rate with chest discomfort and vomiting x 1.  He currently does not have any chest d/c and feels better today but still has RVR. He continues on apixaban but may have missed a few doses over the last few weeks.Continues on cardizem 120 mg qd.   Returns 6/12 after increasing Cardizem to 120 mg bid. EKG now appears to be a tach or sinus tach at 102 bpm.  Feels better but is still having some spells of increased heart beat. Very  stressed out re his wife and her mental health concerns.  Today, he denies symptoms of  chest pain, orthopnea, PND, lower extremity edema, dizziness, presyncope, syncope, or neurologic sequela.Positive for shortness of breath, fatigue, palpitations. The patient is tolerating medications without difficulties and is otherwise without complaint today.   Past Medical History  Diagnosis Date  . Hypothyroidism   . Hypertension   . Gout   . Femur fracture ([place])   . Persistent atrial fibrillation ([place])   . Complication of anesthesia     BECAME COMBATIVE  . Arthritis   . History of gout   . Precancerous skin lesion   . GERD (gastroesophageal reflux disease)     occasional TUMS  . History of kidney stones   . BPH (benign prostatic hypertrophy)   . CAD (coronary artery disease)     a. s/p DES 05/2014 at [facility]  . Dysrhythmia   . Atrial fibrillation [facility])    Past Surgical History  Procedure Laterality Date  . Cervical spine surgery    . Arm fracture surgery Left   . Femur fracture surgery    . Ercp [name] 01/02/2013    Procedure: ENDOSCOPIC RETROGRADE CHOLANGIOPANCREATOGRAPHY (ERCP);  Surgeon: [name], MD;  [name];  Service: Endoscopy;  Laterality: [name];  . Sphincterotomy [name] 01/02/2013    Procedure: SPHINCTEROTOMY;  Surgeon:  [name], MD;  [name];  Service: Endoscopy;  Laterality: [name];  Stone Extraction  . Cholecystectomy [name] 01/03/2013    Procedure: LAPAROSCOPIC CHOLECYSTECTOMY;  Surgeon: [name], MD;  [name];  Service: General;  Laterality: [name];  . Liver biopsy [name] 01/03/2013    Procedure: LIVER BIOPSY;  Surgeon: [name], MD;  [name];  Service: General;  Laterality: [name];  . Cataracts      EXCISION  . Total knee arthroplasty Right 05/30/2013    Procedure: RIGHT TOTAL KNEE ARTHROPLASTY;  Surgeon: [name], MD;  [name];  Service: Orthopedics;  Laterality: Right;  . Cardioversion [name] 12/09/2014    Procedure:  CARDIOVERSION;  Surgeon: [name], MD;  Location: [place];  Service: Cardiovascular;  Laterality: [name];  . Electrophysiologic study [name] 03/15/2015    Procedure: Atrial Fibrillation Ablation;  Surgeon: [name], MD;  Location: [place] [facility];  Service: Cardiovascular;  Laterality: [name];    Current Outpatient Prescriptions  Medication Sig Dispense Refill  . allopurinol (ZYLOPRIM) 300 MG tablet Take 300 mg by mouth every morning.     [name] apixaban (ELIQUIS) 5 MG TABS tablet Take 1 tablet (5 mg total) by mouth 2 (two) times daily. 60 tablet 6  . diltiazem (CARDIZEM CD) 120 MG 24 hr capsule Take 1 capsule (120 mg total) by mouth 2 (two) times daily. 30 capsule 6  . furosemide (LASIX) 40 MG tablet Take 1 tablet (40 mg total) by mouth daily. 90 tablet 3  . HYDROcodone-acetaminophen (NORCO) 7.5-325 MG per tablet Take 1-2 tablets by mouth every 4 (four) hours as needed for moderate pain. For pain 100 tablet 0  . SYNTHROID 150 MCG tablet Take 125 mcg by mouth daily.  0  . tamsulosin (FLOMAX) 0.4 MG CAPS capsule Take 0.4 mg by mouth every morning.    . nitroGLYCERIN (NITROSTAT) 0.4 MG SL tablet Place 0.4 mg under the tongue every 5 (five) minutes x 3 doses as needed for chest pain.      No current facility-administered medications for this encounter.    No Known Allergies  Social History   Social History  . Marital Status: Married    Spouse Name: [name]  . Number of Children: [name]  . Years of Education: [name]   Occupational History  . Not on file.   Social History Main Topics  . Smoking status: Former Smoker    Quit date: 05/23/1973  . Smokeless tobacco: Never Used  . Alcohol Use: 0.0 oz/week    0 Standard drinks or equivalent per week     Comment: RARE  . Drug Use: No  . Sexual Activity: Not on file   Other Topics Concern  . Not on file   Social History Narrative    Family History  Problem Relation Age of Onset  . Kidney Stones Mother     ROS- All systems are reviewed and  negative except as per the HPI above  Physical Exam: Filed Vitals:   10/08/15 1327  BP: 126/82  Pulse: 102  Height: 5\' 11"  (1.803 m)  Weight: 263 lb 6.4 oz (119.477 kg)    GEN- The patient is well appearing, alert and oriented x 3 today.   Head- normocephalic, atraumatic Eyes-  Sclera clear, conjunctiva pink Ears- hearing intact Oropharynx- clear Neck- supple, no JVP Lymph- no cervical lymphadenopathy Lungs- Clear to ausculation bilaterally, normal work of breathing Heart-Irregular,rate and rhythm, no murmurs, rubs or gallops, PMI not laterally  displaced GI- soft, NT, ND, + BS Extremities- no clubbing, cyanosis, or edema MS- no significant deformity or atrophy Skin- no rash or lesion Psych- euthymic mood, full affect Neuro- strength and sensation are intact  EKG-Sinus tachy vrs atach at 102 bpm, with PAC's Epic records reviewed  Assessment and Plan: 1.Symptomatic PAF S/p ablation Off sotalol Continue cardizem to 120 mg bid, discussed increasing to 240 mg am and 120 pm, but pt is hesitant because he is slow when he returns to SR Continue apixaban  F/U 2-3 days for ekg/symptoms, may require repeat cardioversion, pt states Dr. [name] mentioned in past he may need repeat ablation  Butch Penny C. Arriyana Rodell, [facility] [address] [place], [place] [address] [phone]

## 2015-10-08 NOTE — Addendum Note (Signed)
Encounter addended by: Sherran Needs, NP on: 10/08/2015  1:18 PM<BR>     Documentation filed: Notes Section

## 2015-10-09 ENCOUNTER — Other Ambulatory Visit (HOSPITAL_COMMUNITY): Payer: Self-pay | Admitting: *Deleted

## 2015-10-09 MED ORDER — APIXABAN 5 MG PO TABS: 5.0000 mg | ORAL_TABLET | Freq: Two times a day (BID) | ORAL | Status: DC

## 2015-10-12 ENCOUNTER — Encounter (HOSPITAL_COMMUNITY): Payer: Self-pay | Admitting: Nurse Practitioner

## 2015-10-12 ENCOUNTER — Ambulatory Visit (HOSPITAL_COMMUNITY)
Admission: RE | Admit: 2015-10-12 | Discharge: 2015-10-12 | Disposition: A | Discharge status: Home / Self Care | Payer: Medicare HMO | Source: Ambulatory Visit | Attending: Nurse Practitioner | Admitting: Nurse Practitioner

## 2015-10-12 ENCOUNTER — Other Ambulatory Visit: Payer: Self-pay

## 2015-10-12 VITALS — BP 142/78 | HR 88 | Ht 71.0 in | Wt 264.8 lb

## 2015-10-12 DIAGNOSIS — Z7901 Long term (current) use of anticoagulants: Secondary | ICD-10-CM | POA: Diagnosis not present

## 2015-10-12 DIAGNOSIS — Z79899 Other long term (current) drug therapy: Secondary | ICD-10-CM | POA: Diagnosis not present

## 2015-10-12 DIAGNOSIS — I481 Persistent atrial fibrillation: Secondary | ICD-10-CM | POA: Diagnosis not present

## 2015-10-12 DIAGNOSIS — K219 Gastro-esophageal reflux disease without esophagitis: Secondary | ICD-10-CM | POA: Insufficient documentation

## 2015-10-12 DIAGNOSIS — I480 Paroxysmal atrial fibrillation: Secondary | ICD-10-CM | POA: Insufficient documentation

## 2015-10-12 DIAGNOSIS — I10 Essential (primary) hypertension: Secondary | ICD-10-CM | POA: Diagnosis not present

## 2015-10-12 DIAGNOSIS — E039 Hypothyroidism, unspecified: Secondary | ICD-10-CM | POA: Insufficient documentation

## 2015-10-12 DIAGNOSIS — I2510 Atherosclerotic heart disease of native coronary artery without angina pectoris: Secondary | ICD-10-CM | POA: Insufficient documentation

## 2015-10-12 DIAGNOSIS — M1990 Unspecified osteoarthritis, unspecified site: Secondary | ICD-10-CM | POA: Diagnosis not present

## 2015-10-12 DIAGNOSIS — Z87891 Personal history of nicotine dependence: Secondary | ICD-10-CM | POA: Diagnosis not present

## 2015-10-12 DIAGNOSIS — M109 Gout, unspecified: Secondary | ICD-10-CM | POA: Insufficient documentation

## 2015-10-12 DIAGNOSIS — I4891 Unspecified atrial fibrillation: Secondary | ICD-10-CM | POA: Diagnosis present

## 2015-10-12 DIAGNOSIS — I491 Atrial premature depolarization: Secondary | ICD-10-CM | POA: Diagnosis not present

## 2015-10-12 NOTE — Progress Notes (Signed)
Patient ID: Wayne Ortiz, male   DOB: Jan 11, 1946, 69 y.o.   MRN: [id number]    Primary Care Physician: [name], MD Referring Physician:Dr. [name]   Wayne Ortiz is a 70 y.o. male with a h/o afib s/p ablation that went back into afib last week and cardizem was doubled. He continues on Eliquis. He returns today with SR with PAC's. Feels well, no complaints. Have encouraged him to eat more healthy and work on weight loss.  Today, he denies symptoms of palpitations, chest pain, shortness of breath, orthopnea, PND, lower extremity edema, dizziness, presyncope, syncope, or neurologic sequela. The patient is tolerating medications without difficulties and is otherwise without complaint today.   Past Medical History  Diagnosis Date  . Hypothyroidism   . Hypertension   . Gout   . Femur fracture ([place])   . Persistent atrial fibrillation ([place])   . Complication of anesthesia     BECAME COMBATIVE  . Arthritis   . History of gout   . Precancerous skin lesion   . GERD (gastroesophageal reflux disease)     occasional TUMS  . History of kidney stones   . BPH (benign prostatic hypertrophy)   . CAD (coronary artery disease)     a. [facility] 05/2014 at [facility]  . Dysrhythmia   . Atrial fibrillation [facility])    Past Surgical History  Procedure Laterality Date  . Cervical spine surgery    . Arm fracture surgery Left   . Femur fracture surgery    . Ercp [name] 01/02/2013    Procedure: ENDOSCOPIC RETROGRADE CHOLANGIOPANCREATOGRAPHY (ERCP);  Surgeon: [name], MD;  [name];  Service: Endoscopy;  Laterality: [name];  . Sphincterotomy [name] 01/02/2013    Procedure: SPHINCTEROTOMY;  Surgeon: [name], MD;  [name];  Service: Endoscopy;  Laterality: [name];  Stone Extraction  . Cholecystectomy [name] 01/03/2013    Procedure: LAPAROSCOPIC CHOLECYSTECTOMY;  Surgeon: [name], MD;  [name];  Service: General;  Laterality: [name];  . Liver biopsy [name] 01/03/2013    Procedure: LIVER BIOPSY;   Surgeon: [name], MD;  [name];  Service: General;  Laterality: [name];  . Cataracts      EXCISION  . Total knee arthroplasty Right 05/30/2013    Procedure: RIGHT TOTAL KNEE ARTHROPLASTY;  Surgeon: [name], MD;  [name];  Service: Orthopedics;  Laterality: Right;  . Cardioversion [name] 12/09/2014    Procedure: CARDIOVERSION;  Surgeon: [name], MD;  Location: [place];  Service: Cardiovascular;  Laterality: [name];  . Electrophysiologic study [name] 03/15/2015    Procedure: Atrial Fibrillation Ablation;  Surgeon: [name], MD;  Location: [place] [facility];  Service: Cardiovascular;  Laterality: [name];    Current Outpatient Prescriptions  Medication Sig Dispense Refill  . allopurinol (ZYLOPRIM) 300 MG tablet Take 300 mg by mouth every morning.     [name] apixaban (ELIQUIS) 5 MG TABS tablet Take 1 tablet (5 mg total) by mouth 2 (two) times daily. 60 tablet 11  . diltiazem (CARDIZEM CD) 120 MG 24 hr capsule Take 1 capsule (120 mg total) by mouth 2 (two) times daily. 30 capsule 6  . furosemide (LASIX) 40 MG tablet Take 1 tablet (40 mg total) by mouth daily. 90 tablet 3  . HYDROcodone-acetaminophen (NORCO) 7.5-325 MG per tablet Take 1-2 tablets by mouth every 4 (four) hours as needed for moderate pain. For pain 100 tablet 0  . SYNTHROID 150 MCG tablet Take 125  mcg by mouth daily.  0  . tamsulosin (FLOMAX) 0.4 MG CAPS capsule Take 0.4 mg by mouth every morning.    . nitroGLYCERIN (NITROSTAT) 0.4 MG SL tablet Place 0.4 mg under the tongue every 5 (five) minutes x 3 doses as needed for chest pain.      No current facility-administered medications for this encounter.    No Known Allergies  Social History   Social History  . Marital Status: Married    Spouse Name: [name]  . Number of Children: [name]  . Years of Education: [name]   Occupational History  . Not on file.   Social History Main Topics  . Smoking status: Former Smoker    Quit date: 05/23/1973  . Smokeless tobacco:  Never Used  . Alcohol Use: 0.0 oz/week    0 Standard drinks or equivalent per week     Comment: RARE  . Drug Use: No  . Sexual Activity: Not on file   Other Topics Concern  . Not on file   Social History Narrative    Family History  Problem Relation Age of Onset  . Kidney Stones Mother     ROS- All systems are reviewed and negative except as per the HPI above  Physical Exam: Filed Vitals:   10/12/15 1012  BP: 142/78  Pulse: 88  Height: 5\' 11"  (1.803 m)  Weight: 264 lb 12.8 oz (120.112 kg)    GEN- The patient is well appearing, alert and oriented x 3 today.   Head- normocephalic, atraumatic Eyes-  Sclera clear, conjunctiva pink Ears- hearing intact Oropharynx- clear Neck- supple, no JVP Lymph- no cervical lymphadenopathy Lungs- Clear to ausculation bilaterally, normal work of breathing Heart- Regular rate and rhythm, no murmurs, rubs or gallops, PMI not laterally displaced GI- soft, NT, ND, + BS Extremities- no clubbing, cyanosis, or edema MS- no significant deformity or atrophy Skin- no rash or lesion Psych- euthymic mood, full affect Neuro- strength and sensation are intact  EKG-SR at 88 bpm with PAC's, Pr int 148 ms, Qrs int 94 ms, Qtc 457 ms Epic records reviewed  Assessment and Plan: 1. PAF, s/p ablation Continue cardizem 120 mg bid  Continue apixaban 5 mg bid, assistance forms given  2. Lifestyle issues Encouraged to eat healthy foods and continue weight loss efforts He has lost from 275 lbs to current 264  F/u Dr. [name] as scheduled afib clinic as needed  Butch Penny C. Zarea Diesing, [facility] [address] [place], [place] [address] [phone]

## 2015-11-05 ENCOUNTER — Ambulatory Visit (INDEPENDENT_AMBULATORY_CARE_PROVIDER_SITE_OTHER): Payer: Medicare HMO | Admitting: Internal Medicine

## 2015-11-05 ENCOUNTER — Encounter: Payer: Self-pay | Admitting: Internal Medicine

## 2015-11-05 VITALS — BP 112/84 | HR 88 | Ht 71.0 in | Wt 263.0 lb

## 2015-11-05 DIAGNOSIS — I481 Persistent atrial fibrillation: Secondary | ICD-10-CM | POA: Diagnosis not present

## 2015-11-05 DIAGNOSIS — I10 Essential (primary) hypertension: Secondary | ICD-10-CM

## 2015-11-05 DIAGNOSIS — I4819 Other persistent atrial fibrillation: Secondary | ICD-10-CM

## 2015-11-05 NOTE — Progress Notes (Signed)
Electrophysiology Office Note   Date:  11/05/2015   ID:  EDI HEITZMANN, DOB July 18, 1945, MRN [id number]  PCP:  [name], MD  Cardiologist:  Dr Harl Bowie Primary Electrophysiologist: [name], MD    Chief Complaint  Patient presents with  . Atrial Fibrillation     History of Present Illness: Wayne Ortiz is a 70 y.o. male who presents today for electrophysiology evaluation.   He has h/o CAD- recent DES to LCX and DES x2 to RCA at [facility] 2/2016and persistent afib.  He previously failed medical therapy with amiodarone and sotalol.  He underwent afib ablation by me 11/16.  He has occasional palpitations.  Today however he is not sure if he is in sinus or afib (he is in sinus).  His wife recently died and he is very sad/ stressed.  Today, he denies symptoms of palpitations, chest pain, orthopnea, PND, lower extremity edema, claudication, dizziness, presyncope, syncope, bleeding, or neurologic sequela. The patient is tolerating medications without difficulties and is otherwise without complaint today.    Past Medical History  Diagnosis Date  . Hypothyroidism   . Hypertension   . Gout   . Femur fracture ([place])   . Persistent atrial fibrillation ([place])   . Complication of anesthesia     BECAME COMBATIVE  . Arthritis   . History of gout   . Precancerous skin lesion   . GERD (gastroesophageal reflux disease)     occasional TUMS  . History of kidney stones   . BPH (benign prostatic hypertrophy)   . CAD (coronary artery disease)     a. s/p DES 05/2014 at [facility]  . Dysrhythmia   . Atrial fibrillation [facility])    Past Surgical History  Procedure Laterality Date  . Cervical spine surgery    . Arm fracture surgery Left   . Femur fracture surgery    . Ercp [name] 01/02/2013    Procedure: ENDOSCOPIC RETROGRADE CHOLANGIOPANCREATOGRAPHY (ERCP);  Surgeon: [name], MD;  Location: [name];  Service: Endoscopy;  Laterality: [name];  . Sphincterotomy [name] 01/02/2013    Procedure: SPHINCTEROTOMY;   Surgeon: [name], MD;  Location: [name];  Service: Endoscopy;  Laterality: [name];  Stone Extraction  . Cholecystectomy [name] 01/03/2013    Procedure: LAPAROSCOPIC CHOLECYSTECTOMY;  Surgeon: [name], MD;  Location: [name];  Service: General;  Laterality: [name];  . Liver biopsy [name] 01/03/2013    Procedure: LIVER BIOPSY;  Surgeon: [name], MD;  Location: [name];  Service: General;  Laterality: [name];  . Cataracts      EXCISION  . Total knee arthroplasty Right 05/30/2013    Procedure: RIGHT TOTAL KNEE ARTHROPLASTY;  Surgeon: [name], MD;  [name];  Service: Orthopedics;  Laterality: Right;  . Cardioversion [name] 12/09/2014    Procedure: CARDIOVERSION;  Surgeon: [name], MD;  Location: [place];  Service: Cardiovascular;  Laterality: [name];  . Electrophysiologic study [name] 03/15/2015    Procedure: Atrial Fibrillation Ablation;  Surgeon: [name], MD;  Location: [place] CV LAB;  Service: Cardiovascular;  Laterality: [name];     Current Outpatient Prescriptions  Medication Sig Dispense Refill  . allopurinol (ZYLOPRIM) 300 MG tablet Take 300 mg by mouth every morning.     [name] apixaban (ELIQUIS) 5 MG TABS tablet Take 1 tablet (5 mg total) by mouth 2 (two) times daily. 60 tablet 11  . diltiazem (CARDIZEM CD) 120 MG 24 hr capsule Take 1 capsule (120 mg total) by  mouth 2 (two) times daily. 30 capsule 6  . furosemide (LASIX) 40 MG tablet Take 1 tablet (40 mg total) by mouth daily. 90 tablet 3  . HYDROcodone-acetaminophen (NORCO) 7.5-325 MG per tablet Take 1-2 tablets by mouth every 4 (four) hours as needed for moderate pain. For pain 100 tablet 0  . SYNTHROID 150 MCG tablet Take 125 mcg by mouth daily.  0  . tamsulosin (FLOMAX) 0.4 MG CAPS capsule Take 0.4 mg by mouth every morning.    . nitroGLYCERIN (NITROSTAT) 0.4 MG SL tablet Place 0.4 mg under the tongue every 5 (five) minutes x 3 doses as needed for chest pain.      No current facility-administered medications for this  visit.    Allergies:   Review of patient's allergies indicates no known allergies.   Social History:  The patient  reports that he quit smoking about 42 years ago. He has never used smokeless tobacco. He reports that he drinks alcohol. He reports that he does not use illicit drugs.   Family History:  The patient's  family history includes Kidney Stones in his mother.    ROS:  Please see the history of present illness.   All other systems are reviewed and negative.    PHYSICAL EXAM: VS:  BP 112/84 mmHg  Pulse 88  Ht 5\' 11"  (1.803 m)  Wt 263 lb (119.296 kg)  BMI 36.70 kg/m2  SpO2 96% , BMI Body mass index is 36.7 kg/(m^2). GEN: overweight, in no acute distress HEENT: normal Neck: no JVD, carotid bruits, or masses Cardiac: bradycardic regular rhythm no murmurs, rubs, or gallops,no edema  Respiratory:  clear to auscultation bilaterally, normal work of breathing GI: soft, nontender, nondistended, + BS MS: no deformity or atrophy Skin: warm and dry  Neuro:  Strength and sensation are intact Psych: euthymic mood, full affect  EKG:  EKG is ordered today. The ekg ordered today shows sinus rhythm 88 bpm, otherwise normal ekg   Recent Labs: 12/01/2014: TSH 0.314* 12/07/2014: Magnesium 2.0 03/08/2015: Hemoglobin 16.7; Platelets 152 04/12/2015: BUN 19; Creatinine, Ser 1.10; Potassium 4.6; Sodium 141    Lipid Panel     Component Value Date/Time   CHOL 164 08/08/2014 0711   TRIG 104 08/08/2014 0711   HDL 41 08/08/2014 0711   CHOLHDL 4.0 08/08/2014 0711   VLDL 21 08/08/2014 0711   LDLCALC 102* 08/08/2014 0711     Wt Readings from Last 3 Encounters:  11/05/15 263 lb (119.296 kg)  10/12/15 264 lb 12.8 oz (120.112 kg)  10/08/15 263 lb 6.4 oz (119.477 kg)      ASSESSMENT AND PLAN:  1.  Persistent atrial fibrillation Maintaining sinus rhythm post ablation off of AAD therapy though he does have occasional episodes of afib. He reports heart rates are better at home.  Can take  additional diltiazem prn elevated heart rates. Continue life long anticoagulation Will contemplate repeat ablation if AF continues Lifestyle modification is essential   2. Morbid obesity Weight loss is strongly advised.    3. HTN Stable No change required today  Current medicines are reviewed at length with the patient today.   The patient does not have concerns regarding his medicines.  The following changes were made today:  None   Return to see Butch Penny in AF clinic in 4 weeks  Signed, [name], MD  11/05/2015 2:15 PM     [place] [address] [place] [address] [phone] (office) [phone] (fax)

## 2015-11-05 NOTE — Patient Instructions (Signed)
Your physician recommends that you continue on your current medications as directed. Please refer to the Current Medication list given to you today. Your physician recommends that you schedule a follow-up appointment in: 4 weeks with Roderic Palau, NP in the [place].

## 2015-12-03 ENCOUNTER — Ambulatory Visit (HOSPITAL_COMMUNITY)
Admission: RE | Admit: 2015-12-03 | Discharge: 2015-12-03 | Disposition: A | Discharge status: Home / Self Care | Payer: Medicare HMO | Source: Ambulatory Visit | Attending: Nurse Practitioner | Admitting: Nurse Practitioner

## 2015-12-03 ENCOUNTER — Encounter (HOSPITAL_COMMUNITY): Payer: Self-pay | Admitting: Nurse Practitioner

## 2015-12-03 VITALS — BP 146/92 | HR 76 | Ht 71.0 in | Wt 267.4 lb

## 2015-12-03 DIAGNOSIS — R609 Edema, unspecified: Secondary | ICD-10-CM | POA: Diagnosis not present

## 2015-12-03 DIAGNOSIS — Z87442 Personal history of urinary calculi: Secondary | ICD-10-CM | POA: Diagnosis not present

## 2015-12-03 DIAGNOSIS — Z9049 Acquired absence of other specified parts of digestive tract: Secondary | ICD-10-CM | POA: Insufficient documentation

## 2015-12-03 DIAGNOSIS — Z79899 Other long term (current) drug therapy: Secondary | ICD-10-CM | POA: Diagnosis not present

## 2015-12-03 DIAGNOSIS — K219 Gastro-esophageal reflux disease without esophagitis: Secondary | ICD-10-CM | POA: Diagnosis not present

## 2015-12-03 DIAGNOSIS — N400 Enlarged prostate without lower urinary tract symptoms: Secondary | ICD-10-CM | POA: Diagnosis not present

## 2015-12-03 DIAGNOSIS — I2510 Atherosclerotic heart disease of native coronary artery without angina pectoris: Secondary | ICD-10-CM | POA: Insufficient documentation

## 2015-12-03 DIAGNOSIS — I481 Persistent atrial fibrillation: Secondary | ICD-10-CM | POA: Insufficient documentation

## 2015-12-03 DIAGNOSIS — Z7901 Long term (current) use of anticoagulants: Secondary | ICD-10-CM | POA: Diagnosis not present

## 2015-12-03 DIAGNOSIS — Z9889 Other specified postprocedural states: Secondary | ICD-10-CM | POA: Diagnosis not present

## 2015-12-03 DIAGNOSIS — I480 Paroxysmal atrial fibrillation: Secondary | ICD-10-CM | POA: Insufficient documentation

## 2015-12-03 DIAGNOSIS — M109 Gout, unspecified: Secondary | ICD-10-CM | POA: Diagnosis not present

## 2015-12-03 DIAGNOSIS — Z841 Family history of disorders of kidney and ureter: Secondary | ICD-10-CM | POA: Diagnosis not present

## 2015-12-03 DIAGNOSIS — Z791 Long term (current) use of non-steroidal anti-inflammatories (NSAID): Secondary | ICD-10-CM | POA: Diagnosis not present

## 2015-12-03 DIAGNOSIS — I10 Essential (primary) hypertension: Secondary | ICD-10-CM | POA: Diagnosis not present

## 2015-12-03 DIAGNOSIS — M1990 Unspecified osteoarthritis, unspecified site: Secondary | ICD-10-CM | POA: Diagnosis not present

## 2015-12-03 DIAGNOSIS — Z87891 Personal history of nicotine dependence: Secondary | ICD-10-CM | POA: Insufficient documentation

## 2015-12-03 DIAGNOSIS — I4891 Unspecified atrial fibrillation: Secondary | ICD-10-CM | POA: Diagnosis present

## 2015-12-03 DIAGNOSIS — E039 Hypothyroidism, unspecified: Secondary | ICD-10-CM | POA: Diagnosis not present

## 2015-12-03 NOTE — Progress Notes (Signed)
Patient ID: Wayne Ortiz, male   DOB: 09/04/1945, 70 y.o.   MRN: [id number]    Primary Care Physician: [name], MD Referring Physician: [name]   Wayne Ortiz is a 70 y.o. male with a h/o afib  In the clinic for f/u. He had an ablation in 2016, has failed amiodarone and sotalol in the past. His wife suddenly died since pt was seen last and he feels with change of diet, he is retaining fluid with feeling of abdominal fullness, pedal edema and his  weight is up 4 lbs. He has been staying in [place] lately. Chadsvasc score of at least 2, no issues with anticoagulant.  Today, he denies symptoms of palpitations, chest pain, shortness of breath, orthopnea, PND, lower extremity edema, dizziness, presyncope, syncope, or neurologic sequela. The patient is tolerating medications without difficulties and is otherwise without complaint today.   Past Medical History:  Diagnosis Date  . Arthritis   . Atrial fibrillation ([place])   . BPH (benign prostatic hypertrophy)   . CAD (coronary artery disease)    a. s/p DES 05/2014 at [facility]  . Complication of anesthesia    BECAME COMBATIVE  . Dysrhythmia   . Femur fracture ([place])   . GERD (gastroesophageal reflux disease)    occasional TUMS  . Gout   . History of gout   . History of kidney stones   . Hypertension   . Hypothyroidism   . Persistent atrial fibrillation ([place])   . Precancerous skin lesion    Past Surgical History:  Procedure Laterality Date  . arm fracture surgery Left   . CARDIOVERSION [name] 12/09/2014   Procedure: CARDIOVERSION;  Surgeon: [name], MD;  Location: [place];  Service: Cardiovascular;  Laterality: [name];  . CATARACTS     EXCISION  . CERVICAL SPINE SURGERY    . CHOLECYSTECTOMY [name] 01/03/2013   Procedure: LAPAROSCOPIC CHOLECYSTECTOMY;  Surgeon: [name], MD;  [name];  Service: General;  Laterality: [name];  . ELECTROPHYSIOLOGIC STUDY [name] 03/15/2015   Procedure: Atrial Fibrillation Ablation;  Surgeon: [name], MD;   Location: [place] CV [facility];  Service: Cardiovascular;  Laterality: [name];  . ERCP [name] 01/02/2013   Procedure: ENDOSCOPIC RETROGRADE CHOLANGIOPANCREATOGRAPHY (ERCP);  Surgeon: [name], MD;  [name];  Service: Endoscopy;  Laterality: [name];  . FEMUR FRACTURE SURGERY    . LIVER BIOPSY [name] 01/03/2013   Procedure: LIVER BIOPSY;  Surgeon: [name], MD;  [facility]: [name];  Service: General;  Laterality: [name];  . SPHINCTEROTOMY [name] 01/02/2013   Procedure: Joan Mayans;  Surgeon: [name], MD;  Location: [name];  Service: Endoscopy;  Laterality: [name];  Stone Extraction  . TOTAL KNEE ARTHROPLASTY Right 05/30/2013   Procedure: RIGHT TOTAL KNEE ARTHROPLASTY;  Surgeon: [name], MD;  [name];  Service: Orthopedics;  Laterality: Right;    Current Outpatient Prescriptions  Medication Sig Dispense Refill  . allopurinol (ZYLOPRIM) 300 MG tablet Take 300 mg by mouth every morning.     [name] apixaban (ELIQUIS) 5 MG TABS tablet Take 1 tablet (5 mg total) by mouth 2 (two) times daily. 60 tablet 11  . diltiazem (CARDIZEM CD) 120 MG 24 hr capsule Take 1 capsule (120 mg total) by mouth 2 (two) times daily. 30 capsule 6  . furosemide (LASIX) 40 MG tablet Take 1 tablet (40 mg total) by mouth daily. 90 tablet 3  . SYNTHROID 150 MCG tablet Take 125 mcg by mouth daily.  0  . tamsulosin (FLOMAX) 0.4 MG CAPS capsule Take 0.4 mg by mouth every morning.    [name] HYDROcodone-acetaminophen (NORCO) 7.5-325 MG per tablet Take 1-2 tablets by mouth every 4 (four) hours as needed for moderate pain. For pain 100 tablet 0  . nitroGLYCERIN (NITROSTAT) 0.4 MG SL tablet Place 0.4 mg under the tongue every 5 (five) minutes x 3 doses as needed for chest pain.      No current facility-administered medications for this encounter.     No Known Allergies  Social History   Social History  . Marital status: Married    Spouse name: [name]  . Number of children: [name]  . Years of education: [name]   Occupational  History  . Not on file.   Social History Main Topics  . Smoking status: Former Smoker    Quit date: 05/23/1973  . Smokeless tobacco: Never Used  . Alcohol use 0.0 oz/week     Comment: RARE  . Drug use: No  . Sexual activity: Not on file   Other Topics Concern  . Not on file   Social History Narrative  . No narrative on file    Family History  Problem Relation Age of Onset  . Kidney Stones Mother     ROS- All systems are reviewed and negative except as per the HPI above  Physical Exam: Vitals:   12/03/15 0846  BP: (!) 146/92  Pulse: 76  Weight: 267 lb 6.4 oz (121.3 kg)  Height: 5\' 11"  (1.803 m)    GEN- The patient is well appearing, alert and oriented x 3 today.   Head- normocephalic, atraumatic Eyes-  Sclera clear, conjunctiva pink Ears- hearing intact Oropharynx- clear Neck- supple, no JVP Lymph- no cervical lymphadenopathy Lungs- Clear to ausculation bilaterally, normal work of breathing Heart- Regular rate and rhythm, no murmurs, rubs or gallops, PMI not laterally displaced GI- soft, NT, ND, + BS Extremities- no clubbing, cyanosis, or edema MS- no significant deformity or atrophy Skin- no rash or lesion Psych- euthymic mood, full affect Neuro- strength and sensation are intact  EKG-NSR at 76 bpm, pr int 166 ms, qrs int 88 ms, sqtc 402 ms Epic records reviewed  Assessment and Plan:    1. PAF Currently in SR  Continue diltiazem 120 mg daily Continue eliquis  2. Edema Probably situational due to wife's death and eating more canned and fast food Instructed to reduce salt intake He has been drinking Gatorade and was asked not to drink this, too much salt/sugar  Increase lasix to 40 mg bid x 3 days Daily weights  F/u afib clinic in 3 months  Butch Penny C. Ladarrian Asencio, [facility] [address] [place], [place] [address] [phone]

## 2015-12-03 NOTE — Patient Instructions (Signed)
Your physician has recommended you make the following change in your medication:  1)Increase lasix 40mg  twice a day for 3 days. Then reduce back down to once a day.

## 2015-12-10 ENCOUNTER — Other Ambulatory Visit (HOSPITAL_COMMUNITY): Payer: Self-pay | Admitting: *Deleted

## 2015-12-10 MED ORDER — DILTIAZEM HCL ER COATED BEADS 120 MG PO CP24: 120.0000 mg | ORAL_CAPSULE | Freq: Two times a day (BID) | ORAL | 3 refills | Status: DC

## 2016-01-10 DIAGNOSIS — M4726 Other spondylosis with radiculopathy, lumbar region: Secondary | ICD-10-CM | POA: Diagnosis not present

## 2016-01-10 DIAGNOSIS — M4317 Spondylolisthesis, lumbosacral region: Secondary | ICD-10-CM | POA: Diagnosis not present

## 2016-01-22 DIAGNOSIS — M7021 Olecranon bursitis, right elbow: Secondary | ICD-10-CM | POA: Diagnosis not present

## 2016-01-25 DIAGNOSIS — Z0000 Encounter for general adult medical examination without abnormal findings: Secondary | ICD-10-CM | POA: Diagnosis not present

## 2016-01-25 DIAGNOSIS — I10 Essential (primary) hypertension: Secondary | ICD-10-CM | POA: Diagnosis not present

## 2016-01-25 DIAGNOSIS — I2510 Atherosclerotic heart disease of native coronary artery without angina pectoris: Secondary | ICD-10-CM | POA: Diagnosis not present

## 2016-01-25 DIAGNOSIS — I4891 Unspecified atrial fibrillation: Secondary | ICD-10-CM | POA: Diagnosis not present

## 2016-01-25 DIAGNOSIS — E039 Hypothyroidism, unspecified: Secondary | ICD-10-CM | POA: Diagnosis not present

## 2016-01-25 DIAGNOSIS — E119 Type 2 diabetes mellitus without complications: Secondary | ICD-10-CM | POA: Diagnosis not present

## 2016-02-07 ENCOUNTER — Telehealth (HOSPITAL_COMMUNITY): Payer: Self-pay | Admitting: *Deleted

## 2016-02-07 NOTE — Telephone Encounter (Signed)
Pt called in stating he is just not feeling well. He cannot walk to his mailbox without being "wore slap out." He has noticed his HR ranging from 90-110 which is normally in the 50-60s. He says he doesn't know what is going on but he feels like he needs to be evaluated. He did take an extra cardizem 120mg  last night and this morning but has not seen a lot of improvement in heart rate. Appt made for 02/08/2016.

## 2016-02-08 ENCOUNTER — Ambulatory Visit (HOSPITAL_COMMUNITY)
Admission: RE | Admit: 2016-02-08 | Discharge: 2016-02-08 | Disposition: A | Discharge status: Home / Self Care | Payer: Medicare HMO | Source: Ambulatory Visit | Attending: Nurse Practitioner | Admitting: Nurse Practitioner

## 2016-02-08 ENCOUNTER — Other Ambulatory Visit: Payer: Self-pay | Admitting: *Deleted

## 2016-02-08 ENCOUNTER — Encounter (HOSPITAL_COMMUNITY): Payer: Self-pay | Admitting: Nurse Practitioner

## 2016-02-08 ENCOUNTER — Other Ambulatory Visit (HOSPITAL_COMMUNITY): Payer: Self-pay | Admitting: Internal Medicine

## 2016-02-08 VITALS — BP 112/86 | HR 88 | Ht 71.0 in | Wt 266.6 lb

## 2016-02-08 DIAGNOSIS — Z87891 Personal history of nicotine dependence: Secondary | ICD-10-CM | POA: Insufficient documentation

## 2016-02-08 DIAGNOSIS — I481 Persistent atrial fibrillation: Secondary | ICD-10-CM | POA: Diagnosis not present

## 2016-02-08 DIAGNOSIS — E039 Hypothyroidism, unspecified: Secondary | ICD-10-CM | POA: Diagnosis not present

## 2016-02-08 DIAGNOSIS — M1990 Unspecified osteoarthritis, unspecified site: Secondary | ICD-10-CM | POA: Insufficient documentation

## 2016-02-08 DIAGNOSIS — R0602 Shortness of breath: Secondary | ICD-10-CM | POA: Diagnosis present

## 2016-02-08 DIAGNOSIS — I5031 Acute diastolic (congestive) heart failure: Secondary | ICD-10-CM | POA: Diagnosis not present

## 2016-02-08 DIAGNOSIS — I10 Essential (primary) hypertension: Secondary | ICD-10-CM

## 2016-02-08 DIAGNOSIS — Z87442 Personal history of urinary calculi: Secondary | ICD-10-CM | POA: Diagnosis not present

## 2016-02-08 DIAGNOSIS — Z79899 Other long term (current) drug therapy: Secondary | ICD-10-CM | POA: Diagnosis not present

## 2016-02-08 DIAGNOSIS — Z7901 Long term (current) use of anticoagulants: Secondary | ICD-10-CM | POA: Diagnosis not present

## 2016-02-08 DIAGNOSIS — K219 Gastro-esophageal reflux disease without esophagitis: Secondary | ICD-10-CM | POA: Insufficient documentation

## 2016-02-08 DIAGNOSIS — I519 Heart disease, unspecified: Secondary | ICD-10-CM | POA: Diagnosis not present

## 2016-02-08 DIAGNOSIS — M109 Gout, unspecified: Secondary | ICD-10-CM | POA: Diagnosis not present

## 2016-02-08 DIAGNOSIS — Z955 Presence of coronary angioplasty implant and graft: Secondary | ICD-10-CM | POA: Diagnosis not present

## 2016-02-08 DIAGNOSIS — M7021 Olecranon bursitis, right elbow: Secondary | ICD-10-CM | POA: Diagnosis not present

## 2016-02-08 DIAGNOSIS — Z6837 Body mass index (BMI) 37.0-37.9, adult: Secondary | ICD-10-CM | POA: Insufficient documentation

## 2016-02-08 DIAGNOSIS — I4819 Other persistent atrial fibrillation: Secondary | ICD-10-CM

## 2016-02-08 DIAGNOSIS — I2510 Atherosclerotic heart disease of native coronary artery without angina pectoris: Secondary | ICD-10-CM | POA: Insufficient documentation

## 2016-02-08 DIAGNOSIS — N400 Enlarged prostate without lower urinary tract symptoms: Secondary | ICD-10-CM | POA: Diagnosis not present

## 2016-02-08 DIAGNOSIS — T148XXD Other injury of unspecified body region, subsequent encounter: Secondary | ICD-10-CM | POA: Diagnosis not present

## 2016-02-08 MED ORDER — DILTIAZEM HCL ER COATED BEADS 120 MG PO CP24: 240.0000 mg | ORAL_CAPSULE | Freq: Two times a day (BID) | ORAL | 3 refills | Status: DC

## 2016-02-08 NOTE — Progress Notes (Signed)
AF clinic Office Note   Date:  02/08/2016   ID:  Wayne Ortiz, DOB 1945/06/12, MRN [id number]  PCP:  [name], MD  Cardiologist:  Dr Harl Bowie Primary Electrophysiologist: [name], MD    CC: SOB   History of Present Illness: Wayne Ortiz is a 70 y.o. male who presents today for electrophysiology evaluation.   He has h/o CAD- recent DES to LCX and DES x2 to RCA at [facility] 05/2014 and persistent afib.  He previously failed medical therapy with amiodarone and sotalol.  He underwent afib ablation by me 11/16.  Unfortunately, he has returned to afib.  He has had symptoms of progressive SOB and fatigue.  He called the AF clinic and his lasix and diltiazem were increased.  He has significantly improved. Today, he denies symptoms of palpitations, chest pain,  PND, lower extremity edema, claudication, dizziness, presyncope, syncope, bleeding, or neurologic sequela. The patient is tolerating medications without difficulties and is otherwise without complaint today.    Past Medical History:  Diagnosis Date  . Arthritis   . Atrial fibrillation ([place])   . BPH (benign prostatic hypertrophy)   . CAD (coronary artery disease)    a. s/p DES 05/2014 at [facility]  . Complication of anesthesia    BECAME COMBATIVE  . Dysrhythmia   . Femur fracture ([place])   . GERD (gastroesophageal reflux disease)    occasional TUMS  . Gout   . History of gout   . History of kidney stones   . Hypertension   . Hypothyroidism   . Persistent atrial fibrillation ([place])   . Precancerous skin lesion    Past Surgical History:  Procedure Laterality Date  . arm fracture surgery Left   . CARDIOVERSION [name] 12/09/2014   Procedure: CARDIOVERSION;  Surgeon: [name], MD;  Location: [place];  Service: Cardiovascular;  Laterality: [name];  . CATARACTS     EXCISION  . CERVICAL SPINE SURGERY    . CHOLECYSTECTOMY [name] 01/03/2013   Procedure: LAPAROSCOPIC CHOLECYSTECTOMY;  Surgeon: [name], MD;  [name];   Service: General;  Laterality: [name];  . ELECTROPHYSIOLOGIC STUDY [name] 03/15/2015   Procedure: Atrial Fibrillation Ablation;  Surgeon: [name], MD;  Location: [place] [facility];  Service: Cardiovascular;  Laterality: [name];  . ERCP [name] 01/02/2013   Procedure: ENDOSCOPIC RETROGRADE CHOLANGIOPANCREATOGRAPHY (ERCP);  Surgeon: [name], MD;  Location: [name];  Service: Endoscopy;  Laterality: [name];  . FEMUR FRACTURE SURGERY    . LIVER BIOPSY [name] 01/03/2013   Procedure: LIVER BIOPSY;  Surgeon: [name], MD;  Location: [name];  Service: General;  Laterality: [name];  . SPHINCTEROTOMY [name] 01/02/2013   Procedure: Joan Mayans;  Surgeon: [name], MD;  Location: [name];  Service: Endoscopy;  Laterality: [name];  Stone Extraction  . TOTAL KNEE ARTHROPLASTY Right 05/30/2013   Procedure: RIGHT TOTAL KNEE ARTHROPLASTY;  Surgeon: [name], MD;  [name];  Service: Orthopedics;  Laterality: Right;     Current Outpatient Prescriptions  Medication Sig Dispense Refill  . allopurinol (ZYLOPRIM) 300 MG tablet Take 300 mg by mouth every morning.     [name] apixaban (ELIQUIS) 5 MG TABS tablet Take 1 tablet (5 mg total) by mouth 2 (two) times daily. 60 tablet 11  . diltiazem (CARDIZEM CD) 120 MG 24 hr capsule Take 2 capsules (240 mg total) by mouth 2 (two) times daily. 180 capsule 3  . furosemide (LASIX) 40 MG tablet  Take 1 tablet (40 mg total) by mouth daily. 90 tablet 3  . HYDROcodone-acetaminophen (NORCO) 7.5-325 MG per tablet Take 1-2 tablets by mouth every 4 (four) hours as needed for moderate pain. For pain 100 tablet 0  . SYNTHROID 150 MCG tablet Take 125 mcg by mouth daily.  0  . tamsulosin (FLOMAX) 0.4 MG CAPS capsule Take 0.4 mg by mouth every morning.    [name] ibuprofen (ADVIL,MOTRIN) 800 MG tablet Take 800 mg by mouth every 8 (eight) hours as needed.    . nitroGLYCERIN (NITROSTAT) 0.4 MG SL tablet Place 0.4 mg under the tongue every 5 (five) minutes x 3 doses as needed for chest pain.        No current facility-administered medications for this encounter.     Allergies:   Review of patient's allergies indicates no known allergies.   Social History:  The patient  reports that he quit smoking about 42 years ago. He has never used smokeless tobacco. He reports that he drinks alcohol. He reports that he does not use drugs.   Family History:  The patient's  family history includes Kidney Stones in his mother.    ROS:  Please see the history of present illness.   All other systems are reviewed and negative.    PHYSICAL EXAM: VS:  BP 112/86 (BP Location: Left Arm, Patient Position: Sitting, Cuff Size: Large)   Pulse 88   Ht 5\' 11"  (1.803 m)   Wt 266 lb 9.6 oz (120.9 kg)   BMI 37.18 kg/m  , BMI Body mass index is 37.18 kg/m. GEN: overweight, in no acute distress  HEENT: normal  Neck: no JVD, carotid bruits, or masses Cardiac: tachycardic irregular rhythm, no murmurs, rubs, or gallops,no edema  Respiratory:  clear to auscultation bilaterally, normal work of breathing GI: soft, nontender, nondistended, + BS MS: no deformity or atrophy  Skin: warm and dry  Neuro:  Strength and sensation are intact Psych: euthymic mood, full affect  EKG:  EKG is ordered today. The ekg ordered today shows afib, V rate 88 bpm   Recent Labs: 03/08/2015: Hemoglobin 16.7; Platelets 152 04/12/2015: BUN 19; Creatinine, Ser 1.10; Potassium 4.6; Sodium 141    Lipid Panel     Component Value Date/Time   CHOL 164 08/08/2014 0711   TRIG 104 08/08/2014 0711   HDL 41 08/08/2014 0711   CHOLHDL 4.0 08/08/2014 0711   VLDL 21 08/08/2014 0711   LDLCALC 102 (H) 08/08/2014 0711     Wt Readings from Last 3 Encounters:  02/08/16 266 lb 9.6 oz (120.9 kg)  12/03/15 267 lb 6.4 oz (121.3 kg)  11/05/15 263 lb (119.3 kg)      ASSESSMENT AND PLAN:  1.  Persistent atrial fibrillation He has returned to afib. Therapeutic strategies for afib including medicine and ablation were discussed in detail  with the patient today. Risk, benefits, and alternatives to repeat EP study and radiofrequency ablation for afib were also discussed in detail today. These risks include but are not limited to stroke, bleeding, vascular damage, tamponade, perforation, damage to the esophagus, lungs, and other structures, pulmonary vein stenosis, worsening renal function, and death. The patient understands these risk and wishes to proceed.  We will therefore proceed with catheter ablation at the next available time.  Cardiac CT will be ordered prior to ablation to exclude LAA thrombus.  Lifestyle modification is essential   2. Morbid obesity Weight loss is strongly advised.    3. HTN Stable No change required  today  4. Acute diastolic dysfunction Continue twice daily lasix for now  Current medicines are reviewed at length with the patient today.   The patient does not have concerns regarding his medicines.  The following changes were made today:  None    Signed, [name], MD  02/08/2016 12:45 PM     [place] [address] [phone] (office) [phone] (fax)

## 2016-02-08 NOTE — Patient Instructions (Signed)
Increase lasix to 40mg  bid for the next 3 days.  Call Lurae Hornbrook once you've talked with your primary in regards to your elbow

## 2016-02-13 ENCOUNTER — Ambulatory Visit (INDEPENDENT_AMBULATORY_CARE_PROVIDER_SITE_OTHER): Payer: Medicare HMO

## 2016-02-13 ENCOUNTER — Encounter: Payer: Self-pay | Admitting: Orthopaedic Surgery

## 2016-02-13 ENCOUNTER — Ambulatory Visit (INDEPENDENT_AMBULATORY_CARE_PROVIDER_SITE_OTHER): Payer: Medicare HMO | Admitting: Orthopaedic Surgery

## 2016-02-13 VITALS — BP 110/76 | HR 98 | Temp 97.3°F | Ht 70.0 in | Wt 266.0 lb

## 2016-02-13 DIAGNOSIS — M25521 Pain in right elbow: Secondary | ICD-10-CM

## 2016-02-13 DIAGNOSIS — I481 Persistent atrial fibrillation: Secondary | ICD-10-CM | POA: Diagnosis not present

## 2016-02-13 DIAGNOSIS — M7021 Olecranon bursitis, right elbow: Secondary | ICD-10-CM

## 2016-02-13 DIAGNOSIS — I4819 Other persistent atrial fibrillation: Secondary | ICD-10-CM

## 2016-02-13 NOTE — Progress Notes (Signed)
Subjective:    Patient ID: Wayne Ortiz, male    DOB: 1945/07/02, 70 y.o.   MRN: [id number]  HPI He has had swelling of the right elbow olecranon area for about a month.  He did not hit the area.  He has no redness.  He has gout but says it has not really hurt like gout pain.  He says the elbow bothers him when he sits down and rests his elbow on his chair arm.  He has no numbness.  He says it is more of a bother than a pain.  He wants the fluid drawn off.  I told him it could recur and might need surgery.  He has atrial fib and is on anti-coagulation.  He is scheduled for ablation in mid November.  He was told to have his elbow taken care of before his ablation.     Review of Systems  HENT: Negative for congestion.   Respiratory: Negative for cough and shortness of breath.   Cardiovascular: Positive for palpitations. Negative for chest pain.  Endocrine: Positive for cold intolerance.  Musculoskeletal: Positive for arthralgias and joint swelling.  Allergic/Immunologic: Positive for environmental allergies.   Past Medical History:  Diagnosis Date  . Arthritis   . Atrial fibrillation (Wayne Ortiz)   . BPH (benign prostatic hypertrophy)   . CAD (coronary artery disease)    a. s/p DES 05/2014 at [name]  . Complication of anesthesia    BECAME COMBATIVE  . Dysrhythmia   . Femur fracture ([name])   . GERD (gastroesophageal reflux disease)    occasional TUMS  . Gout   . History of gout   . History of kidney stones   . Hypertension   . Hypothyroidism   . Persistent atrial fibrillation ([name])   . Precancerous skin lesion     Past Surgical History:  Procedure Laterality Date  . arm fracture surgery Left   . CARDIOVERSION [name] 12/09/2014   Procedure: CARDIOVERSION;  Surgeon: [name], MD;  Location: [place];  Service: Cardiovascular;  Laterality: [name];  . CATARACTS     EXCISION  . CERVICAL SPINE SURGERY    . CHOLECYSTECTOMY [name] 01/03/2013   Procedure: LAPAROSCOPIC CHOLECYSTECTOMY;  Surgeon:  Wayne So, MD;  [name];  Service: General;  Laterality: [name];  . ELECTROPHYSIOLOGIC STUDY [name] 03/15/2015   Procedure: Atrial Fibrillation Ablation;  Surgeon: Wayne Grayer, MD;  Location: [name];  Service: Cardiovascular;  Laterality: [name];  . ERCP [name] 01/02/2013   Procedure: ENDOSCOPIC RETROGRADE CHOLANGIOPANCREATOGRAPHY (ERCP);  Surgeon: Wayne Houston, MD;  [name];  Service: Endoscopy;  Laterality: [name];  . FEMUR FRACTURE SURGERY    . LIVER BIOPSY [name] 01/03/2013   Procedure: LIVER BIOPSY;  Surgeon: Wayne So, MD;  [name];  Service: General;  Laterality: [name];  . SPHINCTEROTOMY [name] 01/02/2013   Procedure: Joan Mayans;  Surgeon: Wayne Houston, MD;  [name];  Service: Endoscopy;  Laterality: [name];  Stone Extraction  . TOTAL KNEE ARTHROPLASTY Right 05/30/2013   Procedure: RIGHT TOTAL KNEE ARTHROPLASTY;  Surgeon: Wayne Pole, MD;  [name];  Service: Orthopedics;  Laterality: Right;    Current Outpatient Prescriptions on File Prior to Visit  Medication Sig Dispense Refill  . allopurinol (ZYLOPRIM) 300 MG tablet Take 300 mg by mouth every morning.     [name] apixaban (ELIQUIS) 5 MG TABS tablet Take 1 tablet (5 mg total) by mouth 2 (two) times daily. 60 tablet 11  .  diltiazem (CARDIZEM CD) 120 MG 24 hr capsule Take 2 capsules (240 mg total) by mouth 2 (two) times daily. 360 capsule 3  . furosemide (LASIX) 40 MG tablet Take 1 tablet (40 mg total) by mouth daily. 90 tablet 3  . HYDROcodone-acetaminophen (NORCO) 7.5-325 MG per tablet Take 1-2 tablets by mouth every 4 (four) hours as needed for moderate pain. For pain 100 tablet 0  . ibuprofen (ADVIL,MOTRIN) 800 MG tablet Take 800 mg by mouth every 8 (eight) hours as needed.    [name] SYNTHROID 150 MCG tablet Take 125 mcg by mouth daily.  0  . nitroGLYCERIN (NITROSTAT) 0.4 MG SL tablet Place 0.4 mg under the tongue every 5 (five) minutes x 3 doses as needed for chest pain.      No current  facility-administered medications on file prior to visit.     Social History   Social History  . Marital status: Married    Spouse name: [name]  . Number of children: [name]  . Years of education: [name]   Occupational History  . Not on file.   Social History Main Topics  . Smoking status: Former Smoker    Quit date: 05/23/1973  . Smokeless tobacco: Never Used  . Alcohol use 0.0 oz/week     Comment: RARE  . Drug use: No  . Sexual activity: Not on file   Other Topics Concern  . Not on file   Social History Narrative  . No narrative on file    Family History  Problem Relation Age of Onset  . Kidney Stones Mother     BP 110/76   Pulse 98   Temp 97.3 F (36.3 C)   Ht 5\' 10"  (1.778 m)   Wt 266 lb (120.7 kg)   BMI 38.17 kg/m      Objective:   Physical Exam  Constitutional: He is oriented to person, place, and time. He appears well-developed and well-nourished.  HENT:  Head: Normocephalic and atraumatic.  Eyes: Conjunctivae and EOM are normal. Pupils are equal, round, and reactive to light.  Neck: Normal range of motion. Neck supple.  Cardiovascular: Normal rate, regular rhythm and intact distal pulses.   Pulmonary/Chest: Effort normal.  Abdominal: Soft.  Musculoskeletal: He exhibits tenderness (He has a large olecranon bursa on the right with thick feeling bursa, no redness, full ROM of elbow, NV intact.  Left arm negative.  Grips normal.).  Neurological: He is alert and oriented to person, place, and time. He has normal reflexes. He displays normal reflexes. No cranial nerve deficit. He exhibits normal muscle tone. Coordination normal.  Skin: Skin is warm and dry.  Psychiatric: He has a normal mood and affect. His behavior is normal. Judgment and thought content normal.    X-rays were done of the left elbow, reported separately.      Assessment & Plan:   Encounter Diagnoses  Name Primary?  . Right elbow pain Yes  . Olecranon bursitis of right elbow   .  Persistent atrial fibrillation ([name])    Procedure note:  After sterile prep and permission from the patient, the olecranon bursa on the right was aspirated of about 15 cc of blood tinged fluid by sterile technique tolerated well.  Dressing and ace applied.  I told him it could recur and most likely will.    I will see him in one week. Consider possible surgery if no better.  He will have to be off his anti-coagulation prior to any surgery.  He understands.  Call if any problem.  He can remove the dressing and Ace later tonight.  Electronically Signed Sanjuana Kava, MD 10/18/20172:55 PM

## 2016-02-20 ENCOUNTER — Encounter (HOSPITAL_COMMUNITY): Payer: Self-pay | Admitting: *Deleted

## 2016-02-20 ENCOUNTER — Encounter: Payer: Self-pay | Admitting: Orthopaedic Surgery

## 2016-02-20 ENCOUNTER — Ambulatory Visit (INDEPENDENT_AMBULATORY_CARE_PROVIDER_SITE_OTHER): Payer: Medicare HMO | Admitting: Orthopaedic Surgery

## 2016-02-20 ENCOUNTER — Other Ambulatory Visit (HOSPITAL_COMMUNITY): Payer: Self-pay | Admitting: *Deleted

## 2016-02-20 VITALS — BP 124/87 | HR 89 | Ht 70.0 in | Wt 266.0 lb

## 2016-02-20 DIAGNOSIS — M7021 Olecranon bursitis, right elbow: Secondary | ICD-10-CM

## 2016-02-20 DIAGNOSIS — I4819 Other persistent atrial fibrillation: Secondary | ICD-10-CM

## 2016-02-20 DIAGNOSIS — I481 Persistent atrial fibrillation: Secondary | ICD-10-CM

## 2016-02-20 NOTE — Progress Notes (Signed)
Patient Wayne Ortiz, male DOB:Jan 07, 1946, 70 y.o. [id number]  Chief Complaint  Patient presents with  . Follow-up    Right elbow pain    HPI  Wayne Ortiz is a 70 y.o. male who has olecranon bursa on the right.  It is smaller than it was before aspiration last time.  He is scheduled to have heart procedure for his atrial fib and will be on blood thinners for at least three months.  He wants to hold off any possible excision of the right olecranon bursa until after his heart procedure.  He will call if he want it done later. HPI  Body mass index is 38.17 kg/m.  ROS  Review of Systems  HENT: Negative for congestion.   Respiratory: Negative for cough and shortness of breath.   Cardiovascular: Positive for palpitations. Negative for chest pain.  Endocrine: Positive for cold intolerance.  Musculoskeletal: Positive for arthralgias and joint swelling.  Allergic/Immunologic: Positive for environmental allergies.    Past Medical History:  Diagnosis Date  . Arthritis   . Atrial fibrillation ([place])   . BPH (benign prostatic hypertrophy)   . CAD (coronary artery disease)    a. s/p DES 05/2014 at [facility]  . Complication of anesthesia    BECAME COMBATIVE  . Dysrhythmia   . Femur fracture ([place])   . GERD (gastroesophageal reflux disease)    occasional TUMS  . Gout   . History of gout   . History of kidney stones   . Hypertension   . Hypothyroidism   . Persistent atrial fibrillation ([place])   . Precancerous skin lesion     Past Surgical History:  Procedure Laterality Date  . arm fracture surgery Left   . CARDIOVERSION [name] 12/09/2014   Procedure: CARDIOVERSION;  Surgeon: [name], MD;  Location: [facility];  Service: Cardiovascular;  Laterality: [name];  . CATARACTS     EXCISION  . CERVICAL SPINE SURGERY    . CHOLECYSTECTOMY [name] 01/03/2013   Procedure: LAPAROSCOPIC CHOLECYSTECTOMY;  Surgeon: [name], MD;  [name];  Service: General;  Laterality: [name];  .  ELECTROPHYSIOLOGIC STUDY [name] 03/15/2015   Procedure: Atrial Fibrillation Ablation;  Surgeon: [name], MD;  Location: [facility];  Service: Cardiovascular;  Laterality: [name];  . ERCP [name] 01/02/2013   Procedure: ENDOSCOPIC RETROGRADE CHOLANGIOPANCREATOGRAPHY (ERCP);  Surgeon: [name], MD;  [name];  Service: Endoscopy;  Laterality: [name];  . FEMUR FRACTURE SURGERY    . LIVER BIOPSY [name] 01/03/2013   Procedure: LIVER BIOPSY;  Surgeon: [name], MD;  [name];  Service: General;  Laterality: [name];  . SPHINCTEROTOMY [name] 01/02/2013   Procedure: Joan Mayans;  Surgeon: [name], MD;  [name];  Service: Endoscopy;  Laterality: [name];  Stone Extraction  . TOTAL KNEE ARTHROPLASTY Right 05/30/2013   Procedure: RIGHT TOTAL KNEE ARTHROPLASTY;  Surgeon: [name], MD;  [name];  Service: Orthopedics;  Laterality: Right;    Family History  Problem Relation Age of Onset  . Kidney Stones Mother     Social History Social History  Substance Use Topics  . Smoking status: Former Smoker    Quit date: 05/23/1973  . Smokeless tobacco: Never Used  . Alcohol use 0.0 oz/week     Comment: RARE    No Known Allergies  Current Outpatient Prescriptions  Medication Sig Dispense Refill  . allopurinol (ZYLOPRIM) 300 MG tablet Take 300 mg by mouth every morning.     [name]  apixaban (ELIQUIS) 5 MG TABS tablet Take 1 tablet (5 mg total) by mouth 2 (two) times daily. 60 tablet 11  . diltiazem (CARDIZEM CD) 120 MG 24 hr capsule Take 2 capsules (240 mg total) by mouth 2 (two) times daily. 360 capsule 3  . furosemide (LASIX) 40 MG tablet Take 1 tablet (40 mg total) by mouth daily. 90 tablet 3  . HYDROcodone-acetaminophen (NORCO) 7.5-325 MG per tablet Take 1-2 tablets by mouth every 4 (four) hours as needed for moderate pain. For pain 100 tablet 0  . ibuprofen (ADVIL,MOTRIN) 800 MG tablet Take 800 mg by mouth every 8 (eight) hours as needed.    [name] SYNTHROID 150 MCG  tablet Take 125 mcg by mouth daily.  0  . nitroGLYCERIN (NITROSTAT) 0.4 MG SL tablet Place 0.4 mg under the tongue every 5 (five) minutes x 3 doses as needed for chest pain.      No current facility-administered medications for this visit.      Physical Exam  Blood pressure 124/87, pulse 89, height 5\' 10"  (1.778 m), weight 266 lb (120.7 kg).  Constitutional: overall normal hygiene, normal nutrition, well developed, normal grooming, normal body habitus. Assistive device:none  Musculoskeletal: gait and station Limp none, muscle tone and strength are normal, no tremors or atrophy is present.  .  Neurological: coordination overall normal.  Deep tendon reflex/nerve stretch intact.  Sensation normal.  Cranial nerves II-XII intact.   Skin:   Normal overall no scars, lesions, ulcers or rashes. No psoriasis.  Psychiatric: Alert and oriented x 3.  Recent memory intact, remote memory unclear.  Normal mood and affect. Well groomed.  Good eye contact.  Cardiovascular: overall no swelling, no varicosities, no edema bilaterally, normal temperatures of the legs and arms, no clubbing, cyanosis and good capillary refill.  Lymphatic: palpation is normal.  He has a olecranon bursa of the right elbow with full ROM of the elbow.  He has no redness.  The bursa is about the size of two grapes.  The patient has been educated about the nature of the problem(s) and counseled on treatment options.  The patient appeared to understand what I have discussed and is in agreement with it.  Encounter Diagnoses  Name Primary?  [name] Olecranon bursitis of right elbow Yes  . Persistent atrial fibrillation ([place])     PLAN Call if any problems.  Precautions discussed.  Continue current medications.   Return to clinic PRN   Electronically Signed Sanjuana Kava, MD 10/25/20179:02 AM

## 2016-03-04 ENCOUNTER — Ambulatory Visit (HOSPITAL_COMMUNITY): Payer: Medicare HMO | Admitting: Nurse Practitioner

## 2016-03-14 ENCOUNTER — Encounter: Payer: Self-pay | Admitting: Internal Medicine

## 2016-03-14 DIAGNOSIS — Z08 Encounter for follow-up examination after completed treatment for malignant neoplasm: Secondary | ICD-10-CM | POA: Diagnosis not present

## 2016-03-14 DIAGNOSIS — L820 Inflamed seborrheic keratosis: Secondary | ICD-10-CM | POA: Diagnosis not present

## 2016-03-14 DIAGNOSIS — L538 Other specified erythematous conditions: Secondary | ICD-10-CM | POA: Diagnosis not present

## 2016-03-14 DIAGNOSIS — L821 Other seborrheic keratosis: Secondary | ICD-10-CM | POA: Diagnosis not present

## 2016-03-14 DIAGNOSIS — Z789 Other specified health status: Secondary | ICD-10-CM | POA: Diagnosis not present

## 2016-03-14 DIAGNOSIS — D485 Neoplasm of uncertain behavior of skin: Secondary | ICD-10-CM | POA: Diagnosis not present

## 2016-03-14 DIAGNOSIS — L814 Other melanin hyperpigmentation: Secondary | ICD-10-CM | POA: Diagnosis not present

## 2016-03-14 DIAGNOSIS — Z85828 Personal history of other malignant neoplasm of skin: Secondary | ICD-10-CM | POA: Diagnosis not present

## 2016-03-18 ENCOUNTER — Encounter (HOSPITAL_COMMUNITY): Payer: Self-pay | Admitting: Nurse Practitioner

## 2016-03-18 ENCOUNTER — Ambulatory Visit (HOSPITAL_COMMUNITY)
Admission: RE | Admit: 2016-03-18 | Discharge: 2016-03-18 | Disposition: A | Discharge status: Home / Self Care | Payer: Medicare HMO | Source: Ambulatory Visit | Attending: Nurse Practitioner | Admitting: Nurse Practitioner

## 2016-03-18 VITALS — BP 118/84 | HR 104 | Ht 70.0 in | Wt 266.4 lb

## 2016-03-18 DIAGNOSIS — Z87442 Personal history of urinary calculi: Secondary | ICD-10-CM | POA: Insufficient documentation

## 2016-03-18 DIAGNOSIS — I2510 Atherosclerotic heart disease of native coronary artery without angina pectoris: Secondary | ICD-10-CM | POA: Insufficient documentation

## 2016-03-18 DIAGNOSIS — M1990 Unspecified osteoarthritis, unspecified site: Secondary | ICD-10-CM | POA: Insufficient documentation

## 2016-03-18 DIAGNOSIS — Z87891 Personal history of nicotine dependence: Secondary | ICD-10-CM | POA: Insufficient documentation

## 2016-03-18 DIAGNOSIS — K219 Gastro-esophageal reflux disease without esophagitis: Secondary | ICD-10-CM | POA: Insufficient documentation

## 2016-03-18 DIAGNOSIS — I10 Essential (primary) hypertension: Secondary | ICD-10-CM | POA: Diagnosis not present

## 2016-03-18 DIAGNOSIS — E039 Hypothyroidism, unspecified: Secondary | ICD-10-CM | POA: Diagnosis not present

## 2016-03-18 DIAGNOSIS — M109 Gout, unspecified: Secondary | ICD-10-CM | POA: Diagnosis not present

## 2016-03-18 DIAGNOSIS — Z7901 Long term (current) use of anticoagulants: Secondary | ICD-10-CM | POA: Insufficient documentation

## 2016-03-18 DIAGNOSIS — L989 Disorder of the skin and subcutaneous tissue, unspecified: Secondary | ICD-10-CM | POA: Insufficient documentation

## 2016-03-18 DIAGNOSIS — Z6838 Body mass index (BMI) 38.0-38.9, adult: Secondary | ICD-10-CM | POA: Insufficient documentation

## 2016-03-18 DIAGNOSIS — I481 Persistent atrial fibrillation: Secondary | ICD-10-CM

## 2016-03-18 DIAGNOSIS — Z9889 Other specified postprocedural states: Secondary | ICD-10-CM | POA: Diagnosis not present

## 2016-03-18 DIAGNOSIS — N400 Enlarged prostate without lower urinary tract symptoms: Secondary | ICD-10-CM | POA: Insufficient documentation

## 2016-03-18 DIAGNOSIS — I4819 Other persistent atrial fibrillation: Secondary | ICD-10-CM

## 2016-03-18 LAB — CBC WITH DIFFERENTIAL/PLATELET
BASOS ABS: 0 10*3/uL (ref 0.0–0.1)
Basophils Relative: 0 %
EOS ABS: 0.1 10*3/uL (ref 0.0–0.7)
Eosinophils Relative: 2 %
HEMATOCRIT: 49.3 % (ref 39.0–52.0)
Hemoglobin: 16.7 g/dL (ref 13.0–17.0)
Lymphocytes Relative: 27 %
Lymphs Abs: 1.4 10*3/uL (ref 0.7–4.0)
MCH: 32.5 pg (ref 26.0–34.0)
MCHC: 33.9 g/dL (ref 30.0–36.0)
MCV: 95.9 fL (ref 78.0–100.0)
MONO ABS: 0.4 10*3/uL (ref 0.1–1.0)
MONOS PCT: 9 %
Neutro Abs: 3.2 10*3/uL (ref 1.7–7.7)
Neutrophils Relative %: 62 %
PLATELETS: 154 10*3/uL (ref 150–400)
RBC: 5.14 MIL/uL (ref 4.22–5.81)
RDW: 13.6 % (ref 11.5–15.5)
WBC: 5.1 10*3/uL (ref 4.0–10.5)

## 2016-03-18 LAB — BASIC METABOLIC PANEL
ANION GAP: 5 (ref 5–15)
BUN: 14 mg/dL (ref 6–20)
CALCIUM: 9.6 mg/dL (ref 8.9–10.3)
CO2: 27 mmol/L (ref 22–32)
CREATININE: 1.29 mg/dL — AB (ref 0.61–1.24)
Chloride: 107 mmol/L (ref 101–111)
GFR, EST NON AFRICAN AMERICAN: 54 mL/min — AB (ref >60)
GLUCOSE: 92 mg/dL (ref 65–99)
Potassium: 3.8 mmol/L (ref 3.5–5.1)
Sodium: 139 mmol/L (ref 135–145)

## 2016-03-18 LAB — TSH: TSH: 0.856 u[IU]/mL (ref 0.350–4.500)

## 2016-03-18 NOTE — Progress Notes (Signed)
Patient ID: Wayne Ortiz, male   DOB: 07/24/1945, 70 y.o.   MRN: [id number]    Primary Care Physician: [name], MD Referring Physician: [name]   Wayne Ortiz is a 70 y.o. male with a h/o afib  in the clinic for f/u, 11/21. He had an ablation in 2016, has failed amiodarone with recent issues with recurrent afib. He was evaluated by Dr. [name] 10/13 and it was felt that he would benefit from repeat ablation. EKG shows afib at  104 bpm.  He feels better today than most.   Today, he denies symptoms of palpitations, chest pain, shortness of breath, orthopnea, PND, lower extremity edema, dizziness, presyncope, syncope, or neurologic sequela. The patient is tolerating medications without difficulties and is otherwise without complaint today.   Past Medical History:  Diagnosis Date  . Arthritis   . Atrial fibrillation ([place])   . BPH (benign prostatic hypertrophy)   . CAD (coronary artery disease)    a. s/p DES 05/2014 at [facility]  . Complication of anesthesia    BECAME COMBATIVE  . Dysrhythmia   . Femur fracture ([place])   . GERD (gastroesophageal reflux disease)    occasional TUMS  . Gout   . History of gout   . History of kidney stones   . Hypertension   . Hypothyroidism   . Persistent atrial fibrillation ([place])   . Precancerous skin lesion    Past Surgical History:  Procedure Laterality Date  . arm fracture surgery Left   . CARDIOVERSION [name] 12/09/2014   Procedure: CARDIOVERSION;  Surgeon: [name], MD;  Location: [facility];  Service: Cardiovascular;  Laterality: [name];  . CATARACTS     EXCISION  . CERVICAL SPINE SURGERY    . CHOLECYSTECTOMY [name] 01/03/2013   Procedure: LAPAROSCOPIC CHOLECYSTECTOMY;  Surgeon: [name], MD;  [name];  Service: General;  Laterality: [name];  . ELECTROPHYSIOLOGIC STUDY [name] 03/15/2015   Procedure: Atrial Fibrillation Ablation;  Surgeon: [name], MD;  Location: [place] [facility] [facility];  Service: Cardiovascular;  Laterality: [name];  . ERCP [name]  01/02/2013   Procedure: ENDOSCOPIC RETROGRADE CHOLANGIOPANCREATOGRAPHY (ERCP);  Surgeon: [name], MD;  [name];  Service: Endoscopy;  Laterality: [name];  . FEMUR FRACTURE SURGERY    . LIVER BIOPSY [name] 01/03/2013   Procedure: LIVER BIOPSY;  Surgeon: [name], MD;  [name];  Service: General;  Laterality: [name];  . SPHINCTEROTOMY [name] 01/02/2013   Procedure: Joan Mayans;  Surgeon: [name], MD;  [name];  Service: Endoscopy;  Laterality: [name];  Stone Extraction  . TOTAL KNEE ARTHROPLASTY Right 05/30/2013   Procedure: RIGHT TOTAL KNEE ARTHROPLASTY;  Surgeon: [name], MD;  [name];  Service: Orthopedics;  Laterality: Right;    Current Outpatient Prescriptions  Medication Sig Dispense Refill  . allopurinol (ZYLOPRIM) 300 MG tablet Take 300 mg by mouth every morning.     [name] apixaban (ELIQUIS) 5 MG TABS tablet Take 1 tablet (5 mg total) by mouth 2 (two) times daily. 60 tablet 11  . diltiazem (CARDIZEM CD) 120 MG 24 hr capsule Take 2 capsules (240 mg total) by mouth 2 (two) times daily. 360 capsule 3  . furosemide (LASIX) 40 MG tablet Take 1 tablet (40 mg total) by mouth daily. 90 tablet 3  . nitroGLYCERIN (NITROSTAT) 0.4 MG SL tablet Place 0.4 mg under the tongue every 5 (five) minutes x 3 doses as needed for chest pain.     [name]  SYNTHROID 150 MCG tablet Take 150 mcg by mouth daily.   0  . HYDROcodone-acetaminophen (NORCO) 7.5-325 MG per tablet Take 1-2 tablets by mouth every 4 (four) hours as needed for moderate pain. For pain (Patient not taking: Reported on 03/18/2016) 100 tablet 0  . ibuprofen (ADVIL,MOTRIN) 800 MG tablet Take 800 mg by mouth every 8 (eight) hours as needed.     No current facility-administered medications for this encounter.     No Known Allergies  Social History   Social History  . Marital status: Married    Spouse name: [name]  . Number of children: [name]  . Years of education: [name]   Occupational History  . Not on file.     Social History Main Topics  . Smoking status: Former Smoker    Quit date: 05/23/1973  . Smokeless tobacco: Never Used  . Alcohol use 0.0 oz/week     Comment: RARE  . Drug use: No  . Sexual activity: Not on file   Other Topics Concern  . Not on file   Social History Narrative  . No narrative on file    Family History  Problem Relation Age of Onset  . Kidney Stones Mother     ROS- All systems are reviewed and negative except as per the HPI above  Physical Exam: Vitals:   03/18/16 0901  BP: 118/84  Pulse: (!) 104  Weight: 266 lb 6.4 oz (120.8 kg)  Height: 5\' 10"  (1.778 m)    GEN- The patient is well appearing, alert and oriented x 3 today.   Head- normocephalic, atraumatic Eyes-  Sclera clear, conjunctiva pink Ears- hearing intact Oropharynx- clear Neck- supple, no JVP Lymph- no cervical lymphadenopathy Lungs- Clear to ausculation bilaterally, normal work of breathing Heart- Regular rate and rhythm, no murmurs, rubs or gallops, PMI not laterally displaced GI- soft, NT, ND, + BS Extremities- no clubbing, cyanosis, or edema MS- no significant deformity or atrophy Skin- no rash or lesion Psych- euthymic mood, full affect Neuro- strength and sensation are intact  EKG afib at 104 bpm, qrs int 90 ms, qtc 397 ms Epic records reviewed Dr. Dola Factor noted reviewed   Assessment and Plan:    1. Persistent symptomatic afib He has returned to afib Pending repeat ablation 12/5, fully consented by Dr. [name], 10/13, with Cardiac CT scheduled 12/1 States no missed doses of  eliquis Bmet/cbc today and pt states that thyroid has not been checked in one year and will check and forward to PCP  2. Acute diastolic dysfunction Continue twice daily lasix for now  3. HTN Stable  4. Morbid obesity Weight loss  strongly advised  F/u one month post ablation 12/5  Butch Penny C. Jaysha Lasure, [facility] [address] [place], [place]  [address] [phone]

## 2016-03-28 ENCOUNTER — Ambulatory Visit (HOSPITAL_COMMUNITY)
Admission: RE | Admit: 2016-03-28 | Discharge: 2016-03-28 | Disposition: A | Discharge status: Home / Self Care | Payer: Medicare HMO | Source: Ambulatory Visit | Attending: Internal Medicine | Admitting: Internal Medicine

## 2016-03-28 DIAGNOSIS — I481 Persistent atrial fibrillation: Secondary | ICD-10-CM | POA: Insufficient documentation

## 2016-03-28 DIAGNOSIS — R918 Other nonspecific abnormal finding of lung field: Secondary | ICD-10-CM | POA: Diagnosis not present

## 2016-03-28 DIAGNOSIS — I4891 Unspecified atrial fibrillation: Secondary | ICD-10-CM | POA: Diagnosis not present

## 2016-03-28 DIAGNOSIS — I4819 Other persistent atrial fibrillation: Secondary | ICD-10-CM

## 2016-03-28 DIAGNOSIS — K769 Liver disease, unspecified: Secondary | ICD-10-CM | POA: Insufficient documentation

## 2016-03-28 MED ORDER — IOPAMIDOL (ISOVUE-370) INJECTION 76%
INTRAVENOUS | Status: AC
  Administered 2016-03-28: 100 mL
  Filled 2016-03-28: qty 100

## 2016-04-01 ENCOUNTER — Ambulatory Visit (HOSPITAL_COMMUNITY)
Admission: RE | Admit: 2016-04-01 | Discharge: 2016-04-02 | Disposition: A | Discharge status: Home / Self Care | Payer: Medicare HMO | Source: Ambulatory Visit | Attending: Internal Medicine | Admitting: Internal Medicine

## 2016-04-01 ENCOUNTER — Encounter (HOSPITAL_COMMUNITY): Payer: Self-pay | Admitting: Certified Registered Nurse Anesthetist

## 2016-04-01 ENCOUNTER — Encounter (HOSPITAL_COMMUNITY)
Admission: RE | Disposition: A | Discharge status: Home / Self Care | Payer: Self-pay | Source: Ambulatory Visit | Attending: Internal Medicine

## 2016-04-01 ENCOUNTER — Ambulatory Visit (HOSPITAL_COMMUNITY): Payer: Medicare HMO | Admitting: Certified Registered Nurse Anesthetist

## 2016-04-01 DIAGNOSIS — M109 Gout, unspecified: Secondary | ICD-10-CM | POA: Insufficient documentation

## 2016-04-01 DIAGNOSIS — Z87891 Personal history of nicotine dependence: Secondary | ICD-10-CM | POA: Diagnosis not present

## 2016-04-01 DIAGNOSIS — I10 Essential (primary) hypertension: Secondary | ICD-10-CM | POA: Diagnosis not present

## 2016-04-01 DIAGNOSIS — Z96651 Presence of right artificial knee joint: Secondary | ICD-10-CM | POA: Insufficient documentation

## 2016-04-01 DIAGNOSIS — I2510 Atherosclerotic heart disease of native coronary artery without angina pectoris: Secondary | ICD-10-CM | POA: Diagnosis not present

## 2016-04-01 DIAGNOSIS — K219 Gastro-esophageal reflux disease without esophagitis: Secondary | ICD-10-CM | POA: Diagnosis not present

## 2016-04-01 DIAGNOSIS — Z79899 Other long term (current) drug therapy: Secondary | ICD-10-CM | POA: Insufficient documentation

## 2016-04-01 DIAGNOSIS — Z87442 Personal history of urinary calculi: Secondary | ICD-10-CM | POA: Diagnosis not present

## 2016-04-01 DIAGNOSIS — Z9049 Acquired absence of other specified parts of digestive tract: Secondary | ICD-10-CM | POA: Insufficient documentation

## 2016-04-01 DIAGNOSIS — I4891 Unspecified atrial fibrillation: Secondary | ICD-10-CM | POA: Diagnosis not present

## 2016-04-01 DIAGNOSIS — N400 Enlarged prostate without lower urinary tract symptoms: Secondary | ICD-10-CM | POA: Insufficient documentation

## 2016-04-01 DIAGNOSIS — D696 Thrombocytopenia, unspecified: Secondary | ICD-10-CM | POA: Diagnosis not present

## 2016-04-01 DIAGNOSIS — Z7901 Long term (current) use of anticoagulants: Secondary | ICD-10-CM | POA: Diagnosis not present

## 2016-04-01 DIAGNOSIS — I481 Persistent atrial fibrillation: Secondary | ICD-10-CM | POA: Insufficient documentation

## 2016-04-01 DIAGNOSIS — I499 Cardiac arrhythmia, unspecified: Secondary | ICD-10-CM | POA: Diagnosis not present

## 2016-04-01 DIAGNOSIS — Z955 Presence of coronary angioplasty implant and graft: Secondary | ICD-10-CM | POA: Insufficient documentation

## 2016-04-01 DIAGNOSIS — E039 Hypothyroidism, unspecified: Secondary | ICD-10-CM | POA: Insufficient documentation

## 2016-04-01 DIAGNOSIS — Z6838 Body mass index (BMI) 38.0-38.9, adult: Secondary | ICD-10-CM | POA: Insufficient documentation

## 2016-04-01 DIAGNOSIS — Z841 Family history of disorders of kidney and ureter: Secondary | ICD-10-CM | POA: Diagnosis not present

## 2016-04-01 HISTORY — PX: ELECTROPHYSIOLOGIC STUDY: SHX172A

## 2016-04-01 LAB — POCT ACTIVATED CLOTTING TIME
Activated Clotting Time: 285 seconds
Activated Clotting Time: 268 seconds
Activated Clotting Time: 153 seconds

## 2016-04-01 LAB — MRSA PCR SCREENING: MRSA by PCR: NEGATIVE

## 2016-04-01 SURGERY — ATRIAL FIBRILLATION ABLATION
Anesthesia: General

## 2016-04-01 MED ORDER — ADENOSINE 6 MG/2ML IV SOLN
INTRAVENOUS | Status: AC
  Filled 2016-04-01: qty 2

## 2016-04-01 MED ORDER — ALBUMIN HUMAN 5 % IV SOLN
INTRAVENOUS | Status: DC | PRN
  Administered 2016-04-01: 11:00:00 via INTRAVENOUS

## 2016-04-01 MED ORDER — ALLOPURINOL 300 MG PO TABS
300.0000 mg | ORAL_TABLET | Freq: Every morning | ORAL | Status: DC
  Administered 2016-04-02: 300 mg via ORAL
  Filled 2016-04-01: qty 1

## 2016-04-01 MED ORDER — HYDROCODONE-ACETAMINOPHEN 5-325 MG PO TABS
1.0000 | ORAL_TABLET | ORAL | Status: DC | PRN
  Administered 2016-04-01: 2 via ORAL
  Filled 2016-04-01: qty 2

## 2016-04-01 MED ORDER — PROTAMINE SULFATE 10 MG/ML IV SOLN
INTRAVENOUS | Status: DC | PRN
  Administered 2016-04-01: 30 mg via INTRAVENOUS

## 2016-04-01 MED ORDER — LEVOTHYROXINE SODIUM 75 MCG PO TABS
150.0000 ug | ORAL_TABLET | Freq: Every day | ORAL | Status: DC
  Administered 2016-04-02: 150 ug via ORAL
  Filled 2016-04-01: qty 2

## 2016-04-01 MED ORDER — HYDROCODONE-ACETAMINOPHEN 5-325 MG PO TABS
ORAL_TABLET | ORAL | Status: AC
  Filled 2016-04-01: qty 2

## 2016-04-01 MED ORDER — TAMSULOSIN HCL 0.4 MG PO CAPS
0.4000 mg | ORAL_CAPSULE | Freq: Every day | ORAL | Status: DC
  Administered 2016-04-01: 0.4 mg via ORAL
  Filled 2016-04-01: qty 1

## 2016-04-01 MED ORDER — FUROSEMIDE 40 MG PO TABS
40.0000 mg | ORAL_TABLET | Freq: Every day | ORAL | Status: DC
  Administered 2016-04-02: 40 mg via ORAL
  Filled 2016-04-01: qty 1

## 2016-04-01 MED ORDER — PHENYLEPHRINE 40 MCG/ML (10ML) SYRINGE FOR IV PUSH (FOR BLOOD PRESSURE SUPPORT)
PREFILLED_SYRINGE | INTRAVENOUS | Status: DC | PRN
  Administered 2016-04-01 (×3): 80 ug via INTRAVENOUS

## 2016-04-01 MED ORDER — ADENOSINE 6 MG/2ML IV SOLN
INTRAVENOUS | Status: DC | PRN
  Administered 2016-04-01 (×2): 12 mg via INTRAVENOUS

## 2016-04-01 MED ORDER — SODIUM CHLORIDE 0.9% FLUSH: 3.0000 mL | INTRAVENOUS | Status: DC | PRN

## 2016-04-01 MED ORDER — SUCCINYLCHOLINE CHLORIDE 200 MG/10ML IV SOSY
PREFILLED_SYRINGE | INTRAVENOUS | Status: DC | PRN
  Administered 2016-04-01: 140 mg via INTRAVENOUS

## 2016-04-01 MED ORDER — FENTANYL CITRATE (PF) 100 MCG/2ML IJ SOLN
INTRAMUSCULAR | Status: DC | PRN
  Administered 2016-04-01 (×2): 50 ug via INTRAVENOUS

## 2016-04-01 MED ORDER — SODIUM CHLORIDE 0.9% FLUSH
3.0000 mL | Freq: Two times a day (BID) | INTRAVENOUS | Status: DC
  Administered 2016-04-01 – 2016-04-02 (×2): 3 mL via INTRAVENOUS

## 2016-04-01 MED ORDER — SODIUM CHLORIDE 0.9 % IV SOLN
INTRAVENOUS | Status: DC | PRN
  Administered 2016-04-01: 10:00:00 via INTRAVENOUS

## 2016-04-01 MED ORDER — BUPIVACAINE HCL (PF) 0.25 % IJ SOLN
INTRAMUSCULAR | Status: AC
  Filled 2016-04-01: qty 30

## 2016-04-01 MED ORDER — IOPAMIDOL (ISOVUE-370) INJECTION 76%
INTRAVENOUS | Status: DC | PRN
  Administered 2016-04-01: 3 mL via INTRAVENOUS

## 2016-04-01 MED ORDER — ACETAMINOPHEN 325 MG PO TABS: 650.0000 mg | ORAL_TABLET | ORAL | Status: DC | PRN

## 2016-04-01 MED ORDER — SALINE SPRAY 0.65 % NA SOLN
2.0000 | NASAL | Status: DC | PRN
  Filled 2016-04-01: qty 44

## 2016-04-01 MED ORDER — PHENYLEPHRINE HCL 10 MG/ML IJ SOLN
INTRAMUSCULAR | Status: DC | PRN
  Administered 2016-04-01: 15 ug/min via INTRAVENOUS

## 2016-04-01 MED ORDER — LIDOCAINE 2% (20 MG/ML) 5 ML SYRINGE
INTRAMUSCULAR | Status: DC | PRN
  Administered 2016-04-01: 60 mg via INTRAVENOUS

## 2016-04-01 MED ORDER — ONDANSETRON HCL 4 MG/2ML IJ SOLN: 4.0000 mg | Freq: Four times a day (QID) | INTRAMUSCULAR | Status: DC | PRN

## 2016-04-01 MED ORDER — SODIUM CHLORIDE 0.9 % IV SOLN: 250.0000 mL | INTRAVENOUS | Status: DC | PRN

## 2016-04-01 MED ORDER — HEPARIN SODIUM (PORCINE) 1000 UNIT/ML IJ SOLN
INTRAMUSCULAR | Status: DC | PRN
  Administered 2016-04-01: 12000 [IU] via INTRAVENOUS
  Administered 2016-04-01: 1000 [IU] via INTRAVENOUS

## 2016-04-01 MED ORDER — ROCURONIUM BROMIDE 10 MG/ML (PF) SYRINGE
PREFILLED_SYRINGE | INTRAVENOUS | Status: DC | PRN
  Administered 2016-04-01: 50 mg via INTRAVENOUS
  Administered 2016-04-01: 20 mg via INTRAVENOUS

## 2016-04-01 MED ORDER — HYDROCODONE-ACETAMINOPHEN 5-325 MG PO TABS
1.0000 | ORAL_TABLET | Freq: Four times a day (QID) | ORAL | Status: DC | PRN
  Administered 2016-04-01: 2 via ORAL

## 2016-04-01 MED ORDER — HEPARIN SODIUM (PORCINE) 1000 UNIT/ML IJ SOLN
INTRAMUSCULAR | Status: DC | PRN
  Administered 2016-04-01: 1000 [IU] via INTRAVENOUS

## 2016-04-01 MED ORDER — HEPARIN SODIUM (PORCINE) 1000 UNIT/ML IJ SOLN
INTRAMUSCULAR | Status: AC
  Filled 2016-04-01: qty 1

## 2016-04-01 MED ORDER — BUPIVACAINE HCL (PF) 0.25 % IJ SOLN
INTRAMUSCULAR | Status: DC | PRN
  Administered 2016-04-01: 15 mL

## 2016-04-01 MED ORDER — EPHEDRINE SULFATE-NACL 50-0.9 MG/10ML-% IV SOSY
PREFILLED_SYRINGE | INTRAVENOUS | Status: DC | PRN
  Administered 2016-04-01 (×2): 5 mg via INTRAVENOUS

## 2016-04-01 MED ORDER — PROPOFOL 10 MG/ML IV BOLUS
INTRAVENOUS | Status: DC | PRN
  Administered 2016-04-01: 250 mg via INTRAVENOUS

## 2016-04-01 MED ORDER — IOPAMIDOL (ISOVUE-370) INJECTION 76%
INTRAVENOUS | Status: AC
  Filled 2016-04-01: qty 50

## 2016-04-01 MED ORDER — APIXABAN 5 MG PO TABS
5.0000 mg | ORAL_TABLET | Freq: Two times a day (BID) | ORAL | Status: DC
  Administered 2016-04-01 – 2016-04-02 (×2): 5 mg via ORAL
  Filled 2016-04-01 (×2): qty 1

## 2016-04-01 MED ORDER — FENTANYL CITRATE (PF) 100 MCG/2ML IJ SOLN: 25.0000 ug | INTRAMUSCULAR | Status: DC | PRN

## 2016-04-01 MED ORDER — SUGAMMADEX SODIUM 200 MG/2ML IV SOLN
INTRAVENOUS | Status: DC | PRN
  Administered 2016-04-01: 200 mg via INTRAVENOUS

## 2016-04-01 SURGICAL SUPPLY — 21 items
BAG SNAP BAND KOVER 36X36 (MISCELLANEOUS) ×2 IMPLANT
BLANKET WARM UNDERBOD FULL ACC (MISCELLANEOUS) ×2 IMPLANT
CATH NAVISTAR SMARTTOUCH DF (ABLATOR) ×1 IMPLANT
CATH SOUNDSTAR 3D IMAGING (CATHETERS) ×1 IMPLANT
CATH VARIABLE LASSO NAV 2515 (CATHETERS) ×1 IMPLANT
CATH WEBSTER BI DIR CS D-F CRV (CATHETERS) ×1 IMPLANT
COVER SWIFTLINK CONNECTOR (BAG) ×2 IMPLANT
NDL TRANSEP BRK 71CM 407200 (NEEDLE) IMPLANT
NDL TRANSSPETAL BRK-XS 71CM (NEEDLE) IMPLANT
NEEDLE TRANSEP BRK 71CM 407200 (NEEDLE) ×2 IMPLANT
NEEDLE TRANSSPETAL BRK-XS 71CM (NEEDLE) ×2
PACK EP LATEX FREE (CUSTOM PROCEDURE TRAY) ×2
PACK EP LF (CUSTOM PROCEDURE TRAY) ×1 IMPLANT
PAD DEFIB LIFELINK (PAD) ×2 IMPLANT
PATCH CARTO3 (PAD) ×1 IMPLANT
SHEATH AVANTI 11F 11CM (SHEATH) ×1 IMPLANT
SHEATH PINNACLE 7F 10CM (SHEATH) ×2 IMPLANT
SHEATH PINNACLE 9F 10CM (SHEATH) ×1 IMPLANT
SHEATH SWARTZ TS SL2 63CM 8.5F (SHEATH) ×1 IMPLANT
SHIELD RADPAD SCOOP 12X17 (MISCELLANEOUS) ×2 IMPLANT
TUBING SMART ABLATE COOLFLOW (TUBING) ×1 IMPLANT

## 2016-04-01 NOTE — Interval H&P Note (Signed)
History and Physical Interval Note:  04/01/2016 10:15 AM  Wayne Ortiz  has presented today for surgery, with the diagnosis of afib  The various methods of treatment have been discussed with the patient and family. After consideration of risks, benefits and other options for treatment, the patient has consented to  Procedure(s): Atrial Fibrillation Ablation ([name]) as a surgical intervention .  The patient's history has been reviewed, patient examined, no change in status, stable for surgery.  I have reviewed the patient's chart and labs.  Questions were answered to the patient's satisfaction.    Therapeutic strategies for afib including medicine and ablation were discussed in detail with the patient today. Risk, benefits, and alternatives to EP study and radiofrequency ablation for afib were also discussed in detail today. These risks include but are not limited to stroke, bleeding, vascular damage, tamponade, perforation, damage to the esophagus, lungs, and other structures, pulmonary vein stenosis, worsening renal function, and death. The patient understands these risk and wishes to proceed.      [name]

## 2016-04-01 NOTE — Anesthesia Procedure Notes (Signed)
Procedure Name: Intubation Date/Time: 04/01/2016 10:46 AM Performed by: Everlean Cherry A Pre-anesthesia Checklist: Patient identified, Emergency Drugs available, Suction available and Patient being monitored Patient Re-evaluated:Patient Re-evaluated prior to inductionOxygen Delivery Method: Circle system utilized Preoxygenation: Pre-oxygenation with 100% oxygen Intubation Type: IV induction and Rapid sequence Laryngoscope Size: Glidescope and 4 Grade View: Grade I Tube type: Oral Tube size: 7.5 mm Number of attempts: 1 Airway Equipment and Method: Video-laryngoscopy and Rigid stylet Placement Confirmation: ETT inserted through vocal cords under direct vision,  positive ETCO2 and breath sounds checked- equal and bilateral Secured at: 23 cm Tube secured with: Tape Dental Injury: Teeth and Oropharynx as per pre-operative assessment  Difficulty Due To: Difficulty was anticipated, Difficult Airway- due to large tongue and Difficult Airway- due to reduced neck mobility Comments: DL x1 with glidescope 4.  Grade 1 view.  EBBS and VSS.

## 2016-04-01 NOTE — Discharge Instructions (Signed)
No driving for 1 week. No lifting over 5 lbs for 1 week. No vigorous or sexual activity for 1 week. You may return to work on 04/08/16. Keep procedure site clean & dry. If you notice increased pain, swelling, bleeding or pus, call/return!  You may shower, but no soaking baths/hot tubs/pools for 1 week.    You have an appointment set up with the [facility].  Multiple studies have shown that being followed by a dedicated atrial fibrillation clinic in addition to the standard care you receive from your other physicians improves health. We believe that enrollment in the atrial fibrillation clinic will allow [place] to better care for you.   The phone number to the [facility] is [phone]. The clinic is staffed Monday through Friday from 8:30am to 5pm.  Parking Directions: The clinic is located in the Heart and Vascular Building connected to [facility]. 1)From [address] turn on to [address] and go to the 3rd entrance  (Heart and Vascular entrance) on the right. 2)Look to the right for Heart &Vascular Parking Garage. 3)A code for the entrance is required please call the clinic to receive this.   4)Take the elevators to the 1st floor. Registration is in the room with the glass walls at the end of the hallway.  If you have any trouble parking or locating the clinic, please dont hesitate to call [phone].

## 2016-04-01 NOTE — Anesthesia Preprocedure Evaluation (Signed)
Anesthesia Evaluation  Patient identified by MRN, date of birth, ID band Patient awake    Reviewed: Allergy & Precautions, NPO status , Patient's Chart, lab work & pertinent test results, reviewed documented beta blocker date and time   Airway Mallampati: II  TM Distance: >3 FB Neck ROM: Full    Dental no notable dental hx.    Pulmonary shortness of breath, former smoker,    Pulmonary exam normal breath sounds clear to auscultation       Cardiovascular hypertension, Pt. on medications and Pt. on home beta blockers + CAD and + Cardiac Stents  Normal cardiovascular exam+ dysrhythmias Atrial Fibrillation  Rhythm:Regular Rate:Normal  02/2015: Left ventricle: Systolic function was moderately to severely   reduced. The estimated ejection fraction was in the range of 30%   to 35%. Diffuse hypokinesis. - Aortic valve: No evidence of vegetation. - Mitral valve: No evidence of vegetation. - Left atrium: The atrium was dilated. No evidence of thrombus in   the atrial cavity or appendage. - Right atrium: No evidence of thrombus in the atrial cavity or   appendage. - Tricuspid valve: No evidence of vegetation. - Pulmonic valve: No evidence of vegetation.   Neuro/Psych negative neurological ROS     GI/Hepatic Neg liver ROS, GERD  ,  Endo/Other  Hypothyroidism Morbid obesity  Renal/GU negative Renal ROS     Musculoskeletal  (+) Arthritis ,   Abdominal (+) + obese,   Peds  Hematology negative hematology ROS (+)   Anesthesia Other Findings   Reproductive/Obstetrics                             Anesthesia Physical  Anesthesia Plan  ASA: III  Anesthesia Plan: General   Post-op Pain Management:    Induction: Intravenous  Airway Management Planned: Oral ETT and Video Laryngoscope Planned  Additional Equipment: None  Intra-op Plan:   Post-operative Plan: Extubation in OR  Informed Consent: I  have reviewed the patients History and Physical, chart, labs and discussed the procedure including the risks, benefits and alternatives for the proposed anesthesia with the patient or authorized representative who has indicated his/her understanding and acceptance.   Dental advisory given  Plan Discussed with: CRNA and Surgeon  Anesthesia Plan Comments:         Anesthesia Quick Evaluation

## 2016-04-01 NOTE — Transfer of Care (Signed)
Immediate Anesthesia Transfer of Care Note  Patient: Wayne Ortiz  Procedure(s) Performed: Procedure(s): Atrial Fibrillation Ablation ([name])  Patient Location: Cath Lab  Anesthesia Type:General  Level of Consciousness: awake, alert , oriented and patient cooperative  Airway & Oxygen Therapy: Patient Spontanous Breathing and Patient connected to face mask oxygen  Post-op Assessment: Report given to RN, Post -op Vital signs reviewed and stable and Patient moving all extremities X 4  Post vital signs: Reviewed and stable  Last Vitals:  Vitals:   04/01/16 0731  BP: (!) 144/83  Pulse: (!) 101  Resp: 18  Temp: 36.8 C    Last Pain:  Vitals:   04/01/16 0825  TempSrc:   PainSc: 5          Complications: No apparent anesthesia complications

## 2016-04-01 NOTE — Anesthesia Postprocedure Evaluation (Signed)
Anesthesia Post Note  Patient: Wayne Ortiz  Procedure(s) Performed: Procedure(s) (LRB): Atrial Fibrillation Ablation ([name])  Patient location during evaluation: PACU Anesthesia Type: General Level of consciousness: awake and alert Pain management: pain level controlled Vital Signs Assessment: post-procedure vital signs reviewed and stable Respiratory status: spontaneous breathing, nonlabored ventilation, respiratory function stable and patient connected to nasal cannula oxygen Cardiovascular status: blood pressure returned to baseline and stable Postop Assessment: no signs of nausea or vomiting Anesthetic complications: no    Last Vitals:  Vitals:   04/01/16 1425 04/01/16 1437  BP: 113/72   Pulse: 69   Resp: 17   Temp:  36.6 C    Last Pain:  Vitals:   04/01/16 1352  TempSrc:   PainSc: 8                  Tiajuana Amass

## 2016-04-01 NOTE — H&P (View-Only) (Signed)
Patient ID: Wayne Ortiz, male   DOB: 1945/10/29, 70 y.o.   MRN: [id number]    Primary Care Physician: [name], MD Referring Physician: [name]   Wayne Ortiz is a 70 y.o. male with a h/o afib  in the clinic for f/u, 11/21. He had an ablation in 2016, has failed amiodarone with recent issues with recurrent afib. He was evaluated by Dr. [name] 10/13 and it was felt that he would benefit from repeat ablation. EKG shows afib at  104 bpm.  He feels better today than most.   Today, he denies symptoms of palpitations, chest pain, shortness of breath, orthopnea, PND, lower extremity edema, dizziness, presyncope, syncope, or neurologic sequela. The patient is tolerating medications without difficulties and is otherwise without complaint today.   Past Medical History:  Diagnosis Date  . Arthritis   . Atrial fibrillation ([place])   . BPH (benign prostatic hypertrophy)   . CAD (coronary artery disease)    a. s/p DES 05/2014 at [facility]  . Complication of anesthesia    BECAME COMBATIVE  . Dysrhythmia   . Femur fracture ([place])   . GERD (gastroesophageal reflux disease)    occasional TUMS  . Gout   . History of gout   . History of kidney stones   . Hypertension   . Hypothyroidism   . Persistent atrial fibrillation ([place])   . Precancerous skin lesion    Past Surgical History:  Procedure Laterality Date  . arm fracture surgery Left   . CARDIOVERSION [name] 12/09/2014   Procedure: CARDIOVERSION;  Surgeon: [name], MD;  Location: [facility];  Service: Cardiovascular;  Laterality: [name];  . CATARACTS     EXCISION  . CERVICAL SPINE SURGERY    . CHOLECYSTECTOMY [name] 01/03/2013   Procedure: LAPAROSCOPIC CHOLECYSTECTOMY;  Surgeon: [name], MD;  Location: [name];  Service: General;  Laterality: [name];  . ELECTROPHYSIOLOGIC STUDY [name] 03/15/2015   Procedure: Atrial Fibrillation Ablation;  Surgeon: [name], MD;  Location: [facility];  Service: Cardiovascular;  Laterality: [name];  . ERCP [name]  01/02/2013   Procedure: ENDOSCOPIC RETROGRADE CHOLANGIOPANCREATOGRAPHY (ERCP);  Surgeon: [name], MD;  Location: [name];  Service: Endoscopy;  Laterality: [name];  . FEMUR FRACTURE SURGERY    . LIVER BIOPSY [name] 01/03/2013   Procedure: LIVER BIOPSY;  Surgeon: [name], MD;  [name];  Service: General;  Laterality: [name];  . SPHINCTEROTOMY [name] 01/02/2013   Procedure: Joan Mayans;  Surgeon: [name], MD;  [facility]: [name];  Service: Endoscopy;  Laterality: [name];  Stone Extraction  . TOTAL KNEE ARTHROPLASTY Right 05/30/2013   Procedure: RIGHT TOTAL KNEE ARTHROPLASTY;  Surgeon: [name], MD;  [name];  Service: Orthopedics;  Laterality: Right;    Current Outpatient Prescriptions  Medication Sig Dispense Refill  . allopurinol (ZYLOPRIM) 300 MG tablet Take 300 mg by mouth every morning.     [name] apixaban (ELIQUIS) 5 MG TABS tablet Take 1 tablet (5 mg total) by mouth 2 (two) times daily. 60 tablet 11  . diltiazem (CARDIZEM CD) 120 MG 24 hr capsule Take 2 capsules (240 mg total) by mouth 2 (two) times daily. 360 capsule 3  . furosemide (LASIX) 40 MG tablet Take 1 tablet (40 mg total) by mouth daily. 90 tablet 3  . nitroGLYCERIN (NITROSTAT) 0.4 MG SL tablet Place 0.4 mg under the tongue every 5 (five) minutes x 3 doses as needed for chest pain.     [name]  SYNTHROID 150 MCG tablet Take 150 mcg by mouth daily.   0  . HYDROcodone-acetaminophen (NORCO) 7.5-325 MG per tablet Take 1-2 tablets by mouth every 4 (four) hours as needed for moderate pain. For pain (Patient not taking: Reported on 03/18/2016) 100 tablet 0  . ibuprofen (ADVIL,MOTRIN) 800 MG tablet Take 800 mg by mouth every 8 (eight) hours as needed.     No current facility-administered medications for this encounter.     No Known Allergies  Social History   Social History  . Marital status: Married    Spouse name: [name]  . Number of children: [name]  . Years of education: [name]   Occupational History  . Not on file.     Social History Main Topics  . Smoking status: Former Smoker    Quit date: 05/23/1973  . Smokeless tobacco: Never Used  . Alcohol use 0.0 oz/week     Comment: RARE  . Drug use: No  . Sexual activity: Not on file   Other Topics Concern  . Not on file   Social History Narrative  . No narrative on file    Family History  Problem Relation Age of Onset  . Kidney Stones Mother     ROS- All systems are reviewed and negative except as per the HPI above  Physical Exam: Vitals:   03/18/16 0901  BP: 118/84  Pulse: (!) 104  Weight: 266 lb 6.4 oz (120.8 kg)  Height: 5\' 10"  (1.778 m)    GEN- The patient is well appearing, alert and oriented x 3 today.   Head- normocephalic, atraumatic Eyes-  Sclera clear, conjunctiva pink Ears- hearing intact Oropharynx- clear Neck- supple, no JVP Lymph- no cervical lymphadenopathy Lungs- Clear to ausculation bilaterally, normal work of breathing Heart- Regular rate and rhythm, no murmurs, rubs or gallops, PMI not laterally displaced GI- soft, NT, ND, + BS Extremities- no clubbing, cyanosis, or edema MS- no significant deformity or atrophy Skin- no rash or lesion Psych- euthymic mood, full affect Neuro- strength and sensation are intact  EKG afib at 104 bpm, qrs int 90 ms, qtc 397 ms Epic records reviewed Dr. Dola Factor noted reviewed   Assessment and Plan:    1. Persistent symptomatic afib He has returned to afib Pending repeat ablation 12/5, fully consented by Dr. [name], 10/13, with Cardiac CT scheduled 12/1 States no missed doses of  eliquis Bmet/cbc today and pt states that thyroid has not been checked in one year and will check and forward to PCP  2. Acute diastolic dysfunction Continue twice daily lasix for now  3. HTN Stable  4. Morbid obesity Weight loss  strongly advised  F/u one month post ablation 12/5  Butch Penny C. [place], [facility] [address] [place],   [address] [phone]

## 2016-04-01 NOTE — Progress Notes (Addendum)
Site area: RFA x 3 Site Prior to Removal:  Level 0 Pressure Applied For: Manual:   yes Patient Status During Pull:  stable Post Pull Site:  Level 0 Post Pull Instructions Given: yes  Post Pull Pulses Present: palpable Dressing Applied:  tegaderm Bedrest begins @ 1430 till 2030 Comments:

## 2016-04-02 ENCOUNTER — Encounter (HOSPITAL_COMMUNITY): Payer: Self-pay | Admitting: Internal Medicine

## 2016-04-02 ENCOUNTER — Other Ambulatory Visit: Payer: Self-pay | Admitting: Physician Assistant

## 2016-04-02 DIAGNOSIS — Z7901 Long term (current) use of anticoagulants: Secondary | ICD-10-CM | POA: Diagnosis not present

## 2016-04-02 DIAGNOSIS — I481 Persistent atrial fibrillation: Secondary | ICD-10-CM | POA: Diagnosis not present

## 2016-04-02 DIAGNOSIS — K219 Gastro-esophageal reflux disease without esophagitis: Secondary | ICD-10-CM | POA: Diagnosis not present

## 2016-04-02 DIAGNOSIS — Z79899 Other long term (current) drug therapy: Secondary | ICD-10-CM | POA: Diagnosis not present

## 2016-04-02 DIAGNOSIS — I2510 Atherosclerotic heart disease of native coronary artery without angina pectoris: Secondary | ICD-10-CM | POA: Diagnosis not present

## 2016-04-02 DIAGNOSIS — I499 Cardiac arrhythmia, unspecified: Secondary | ICD-10-CM | POA: Diagnosis not present

## 2016-04-02 DIAGNOSIS — E039 Hypothyroidism, unspecified: Secondary | ICD-10-CM | POA: Diagnosis not present

## 2016-04-02 DIAGNOSIS — I10 Essential (primary) hypertension: Secondary | ICD-10-CM | POA: Diagnosis not present

## 2016-04-02 DIAGNOSIS — N400 Enlarged prostate without lower urinary tract symptoms: Secondary | ICD-10-CM | POA: Diagnosis not present

## 2016-04-02 DIAGNOSIS — M109 Gout, unspecified: Secondary | ICD-10-CM | POA: Diagnosis not present

## 2016-04-02 MED ORDER — PANTOPRAZOLE SODIUM 40 MG PO TBEC: 40.0000 mg | DELAYED_RELEASE_TABLET | Freq: Every day | ORAL | 0 refills | Status: DC

## 2016-04-02 NOTE — Discharge Summary (Signed)
ELECTROPHYSIOLOGY PROCEDURE DISCHARGE SUMMARY    Patient ID: Wayne Ortiz,  MRN: [id number], DOB/AGE: 1945-08-23 70 y.o.  Admit date: 04/01/2016 Discharge date: 04/02/2016  Primary Care Physician: [name], MD Primary Cardiologist: [name] Electrophysiologist: [name], MD  Primary Discharge Diagnosis:  1. Persistent Afib     CHA2DS2Vasc is at least 3, on Eliquis  Secondary Discharge Diagnosis:  1. CAD (PCI Feb 2016) 2. HTN 3. Hypothyroidism  Procedures This Admission:  1.  Electrophysiology study and radiofrequency catheter ablation on 04/01/16 by Dr [name].   This study demonstrated   Brief HPI: Wayne Ortiz is a 70 y.o. male with a history of persistent atrial fibrillation.  They have failed medical therapy with amiodarone and sotalol.  Risks, benefits, and alternatives to catheter ablation of atrial fibrillation were reviewed with the patient who wished to proceed.  The patient underwent TEE prior to the procedure which demonstrated EF 30-35%, no LAA thrombus.    Hospital Course:  The patient was admitted and underwent EPS/RFCA of atrial fibrillation with details as outlined above.  They were monitored on telemetry overnight which demonstrated SR, APC's.  Groin was without complication on the day of discharge.  The patient was examined by Dr. [name] and considered to be stable for discharge.  Wound care and restrictions were reviewed with the patient.  The patient will be seen back by Roderic Palau, NP in 4 weeks and Dr [name] in 12 weeks for post ablation follow up.   Physical Exam: Vitals:   04/01/16 2000 04/01/16 2300 04/02/16 0340 04/02/16 0752  BP: 110/72 116/72 (!) 102/91 (!) 115/94  Pulse: 84   78  Resp: (!) 24 20 18 19   Temp:  98.2 F (36.8 C) 97.7 F (36.5 C) 97.7 F (36.5 C)  TempSrc:  Oral Oral Oral  SpO2: 97% 98% 98% 96%  Weight:      Height:        GEN- The patient is well appearing, alert and oriented x 3 today.   HEENT:  normocephalic, atraumatic; sclera clear, conjunctiva pink; hearing intact; oropharynx clear; neck supple  Lungs- Clear to ausculation bilaterally, normal work of breathing.  No wheezes, rales, rhonchi Heart- Regular rate and rhythm, no murmurs, rubs or gallops  GI- soft, non-tender, non-distended Extremities- no clubbing, cyanosis, or edema; DP/PT/radial pulses 2+ bilaterally, groin/procedure site is stable without hematoma/bruit MS- no significant deformity or atrophy Skin- warm and dry, no rash or lesion Psych- euthymic mood, full affect Neuro- strength and sensation are intact   Labs:   Lab Results  Component Value Date   WBC 5.1 03/18/2016   HGB 16.7 03/18/2016   HCT 49.3 03/18/2016   MCV 95.9 03/18/2016   PLT 154 03/18/2016   No results for input(s): NA, K, CL, CO2, BUN, CREATININE, CALCIUM, PROT, BILITOT, ALKPHOS, ALT, AST, GLUCOSE in the last 168 hours.  Invalid input(s): LABALBU   Discharge Medications:    Medication List    TAKE these medications   allopurinol 300 MG tablet Commonly known as:  ZYLOPRIM Take 300 mg by mouth every morning.   apixaban 5 MG Tabs tablet Commonly known as:  ELIQUIS Take 1 tablet (5 mg total) by mouth 2 (two) times daily.   diltiazem 120 MG 24 hr capsule Commonly known as:  CARDIZEM CD Take 2 capsules (240 mg total) by mouth 2 (two) times daily.   furosemide 40 MG tablet Commonly known as:  LASIX Take 1 tablet (40 mg total) by mouth  daily.   HYDROcodone-acetaminophen 5-325 MG tablet Commonly known as:  NORCO/VICODIN Take 1 tablet by mouth every 6 (six) hours as needed for moderate pain.   ibuprofen 800 MG tablet Commonly known as:  ADVIL,MOTRIN Take 800 mg by mouth every 8 (eight) hours as needed for moderate pain.   nitroGLYCERIN 0.4 MG SL tablet Commonly known as:  NITROSTAT Place 0.4 mg under the tongue every 5 (five) minutes x 3 doses as needed for chest pain.   pantoprazole 40 MG tablet Commonly known as:   PROTONIX Take 1 tablet (40 mg total) by mouth daily.   sodium chloride 0.65 % Soln nasal spray Commonly known as:  OCEAN Place 2 sprays into both nostrils as needed for congestion.   SYNTHROID 150 MCG tablet Generic drug:  levothyroxine Take 150 mcg by mouth daily.   tamsulosin 0.4 MG Caps capsule Commonly known as:  FLOMAX Take 0.4 mg by mouth daily.       Disposition:  Home Discharge Instructions    Diet - low sodium heart healthy    Complete by:  As directed    Increase activity slowly    Complete by:  As directed      Follow-up Information    [facility] Follow up on 05/02/2016.   Specialty:  Cardiology Why:  9:30AM Contact information: [address] [id number] [place] [phone]       [name], MD Follow up on 06/30/2016.   Specialty:  Cardiology Why:  9:30AM  Contact information: [place] [address] [phone]           Duration of Discharge Encounter: Greater than 30 minutes including physician time.  SignedTommye Standard, PA-C 04/02/2016 9:28 AM  I have seen, examined the patient, and reviewed the above assessment and plan.  On exam, RRR.  Groins looks ok.  Changes to above are made where necessary.  Discharge to home.  Co Sign: [name], MD 04/02/2016 11:12 AM

## 2016-05-02 ENCOUNTER — Encounter (HOSPITAL_COMMUNITY): Payer: Self-pay | Admitting: Nurse Practitioner

## 2016-05-02 ENCOUNTER — Ambulatory Visit (HOSPITAL_COMMUNITY)
Admission: RE | Admit: 2016-05-02 | Discharge: 2016-05-02 | Disposition: A | Discharge status: Home / Self Care | Payer: Medicare HMO | Source: Ambulatory Visit | Attending: Nurse Practitioner | Admitting: Nurse Practitioner

## 2016-05-02 VITALS — BP 112/72 | HR 114 | Ht 70.0 in | Wt 263.6 lb

## 2016-05-02 DIAGNOSIS — Z841 Family history of disorders of kidney and ureter: Secondary | ICD-10-CM | POA: Insufficient documentation

## 2016-05-02 DIAGNOSIS — Z87891 Personal history of nicotine dependence: Secondary | ICD-10-CM | POA: Diagnosis not present

## 2016-05-02 DIAGNOSIS — I2510 Atherosclerotic heart disease of native coronary artery without angina pectoris: Secondary | ICD-10-CM | POA: Insufficient documentation

## 2016-05-02 DIAGNOSIS — M109 Gout, unspecified: Secondary | ICD-10-CM | POA: Insufficient documentation

## 2016-05-02 DIAGNOSIS — I5189 Other ill-defined heart diseases: Secondary | ICD-10-CM | POA: Diagnosis not present

## 2016-05-02 DIAGNOSIS — K219 Gastro-esophageal reflux disease without esophagitis: Secondary | ICD-10-CM | POA: Insufficient documentation

## 2016-05-02 DIAGNOSIS — E039 Hypothyroidism, unspecified: Secondary | ICD-10-CM | POA: Diagnosis not present

## 2016-05-02 DIAGNOSIS — I10 Essential (primary) hypertension: Secondary | ICD-10-CM | POA: Diagnosis not present

## 2016-05-02 DIAGNOSIS — Z9049 Acquired absence of other specified parts of digestive tract: Secondary | ICD-10-CM | POA: Diagnosis not present

## 2016-05-02 DIAGNOSIS — N400 Enlarged prostate without lower urinary tract symptoms: Secondary | ICD-10-CM | POA: Diagnosis not present

## 2016-05-02 DIAGNOSIS — Z79899 Other long term (current) drug therapy: Secondary | ICD-10-CM | POA: Diagnosis not present

## 2016-05-02 DIAGNOSIS — I4819 Other persistent atrial fibrillation: Secondary | ICD-10-CM

## 2016-05-02 DIAGNOSIS — Z9889 Other specified postprocedural states: Secondary | ICD-10-CM | POA: Diagnosis not present

## 2016-05-02 DIAGNOSIS — Z87442 Personal history of urinary calculi: Secondary | ICD-10-CM | POA: Diagnosis not present

## 2016-05-02 DIAGNOSIS — I481 Persistent atrial fibrillation: Secondary | ICD-10-CM | POA: Insufficient documentation

## 2016-05-02 DIAGNOSIS — Z96651 Presence of right artificial knee joint: Secondary | ICD-10-CM | POA: Diagnosis not present

## 2016-05-02 LAB — BASIC METABOLIC PANEL
Anion gap: 11 (ref 5–15)
BUN: 13 mg/dL (ref 6–20)
CO2: 26 mmol/L (ref 22–32)
Calcium: 9.5 mg/dL (ref 8.9–10.3)
Chloride: 104 mmol/L (ref 101–111)
Creatinine, Ser: 1.32 mg/dL — ABNORMAL HIGH (ref 0.61–1.24)
GFR calc Af Amer: >60 mL/min (ref >60)
GFR, EST NON AFRICAN AMERICAN: 53 mL/min — AB (ref >60)
GLUCOSE: 143 mg/dL — AB (ref 65–99)
POTASSIUM: 3.9 mmol/L (ref 3.5–5.1)
Sodium: 141 mmol/L (ref 135–145)

## 2016-05-02 LAB — CBC
HCT: 49.7 % (ref 39.0–52.0)
Hemoglobin: 17.1 g/dL — ABNORMAL HIGH (ref 13.0–17.0)
MCH: 32.6 pg (ref 26.0–34.0)
MCHC: 34.4 g/dL (ref 30.0–36.0)
MCV: 94.7 fL (ref 78.0–100.0)
PLATELETS: 147 10*3/uL — AB (ref 150–400)
RBC: 5.25 MIL/uL (ref 4.22–5.81)
RDW: 14 % (ref 11.5–15.5)
WBC: 5.3 10*3/uL (ref 4.0–10.5)

## 2016-05-02 MED ORDER — APIXABAN 5 MG PO TABS: 5.0000 mg | ORAL_TABLET | Freq: Two times a day (BID) | ORAL | 11 refills | Status: DC

## 2016-05-02 NOTE — Progress Notes (Signed)
Patient ID: Wayne Ortiz, male   DOB: [birth year], 71 y.o.   MRN: [id number]    Primary Care Physician: [name], MD Referring Physician: [name]   Wayne Ortiz is a 71 y.o. male with a h/o afib  in the clinic for f/u, 11/21. He had an ablation in 2016, has failed amiodarone with recent issues with recurrent afib. He was evaluated by Dr. [name] 10/13 and it was felt that he would benefit from repeat ablation. EKG shows afib at  104 bpm.  He feels better today than most.   F/u 05/02/16,pt is here to f/u ablation. He is in afib and feels he has been in afib since last week. No swallowing or groin issues. He does not feel as well in afib with fatigue and shortness of breath.  Today, he denies symptoms of palpitations, chest pain, shortness of breath, orthopnea, PND, lower extremity edema, dizziness, presyncope, syncope, or neurologic sequela. The patient is tolerating medications without difficulties and is otherwise without complaint today.   Past Medical History:  Diagnosis Date  . Arthritis   . Atrial fibrillation ([place])   . BPH (benign prostatic hypertrophy)   . CAD (coronary artery disease)    a. s/p DES 05/2014 at [facility]  . Complication of anesthesia    BECAME COMBATIVE  . Dysrhythmia   . Femur fracture ([place])   . GERD (gastroesophageal reflux disease)    occasional TUMS  . Gout   . History of gout   . History of kidney stones   . Hypertension   . Hypothyroidism   . Persistent atrial fibrillation ([place])   . Precancerous skin lesion    Past Surgical History:  Procedure Laterality Date  . arm fracture surgery Left   . CARDIOVERSION [name] 12/09/2014   Procedure: CARDIOVERSION;  Surgeon: [name], MD;  Location: [facility];  Service: Cardiovascular;  Laterality: [name];  . CATARACTS     EXCISION  . CERVICAL SPINE SURGERY    . CHOLECYSTECTOMY [name] 01/03/2013   Procedure: LAPAROSCOPIC CHOLECYSTECTOMY;  Surgeon: [name], MD;  Location[name];  Service: General;  Laterality: [name];  .  ELECTROPHYSIOLOGIC STUDY [name] 03/15/2015   Procedure: Atrial Fibrillation Ablation;  Surgeon: [name], MD;  Location: [facility];  Service: Cardiovascular;  Laterality: [name];  . ELECTROPHYSIOLOGIC STUDY [name] 04/01/2016   Procedure: Atrial Fibrillation Ablation;  Surgeon: [name], MD;  Location: [facility];  Service: Cardiovascular;  Laterality: [name];  . ERCP [name] 01/02/2013   Procedure: ENDOSCOPIC RETROGRADE CHOLANGIOPANCREATOGRAPHY (ERCP);  Surgeon: [name], MD;  Location[name];  Service: Endoscopy;  Laterality: [name];  . FEMUR FRACTURE SURGERY    . LIVER BIOPSY [name] 01/03/2013   Procedure: LIVER BIOPSY;  Surgeon: [name], MD;  Location[name];  Service: General;  Laterality: [name];  . SPHINCTEROTOMY [name] 01/02/2013   Procedure: Joan Mayans;  Surgeon: [name], MD;  Location[name];  Service: Endoscopy;  Laterality: [name];  Stone Extraction  . TOTAL KNEE ARTHROPLASTY Right 05/30/2013   Procedure: RIGHT TOTAL KNEE ARTHROPLASTY;  Surgeon: [name], MD;  [name];  Service: Orthopedics;  Laterality: Right;    Current Outpatient Prescriptions  Medication Sig Dispense Refill  . allopurinol (ZYLOPRIM) 300 MG tablet Take 300 mg by mouth every morning.     [name] apixaban (ELIQUIS) 5 MG TABS tablet Take 1 tablet (5 mg total) by mouth 2 (two) times daily. 60 tablet 11  . diltiazem (CARDIZEM  CD) 120 MG 24 hr capsule Take 2 capsules (240 mg total) by mouth 2 (two) times daily. 360 capsule 3  . furosemide (LASIX) 40 MG tablet Take 1 tablet (40 mg total) by mouth daily. 90 tablet 3  . HYDROcodone-acetaminophen (NORCO/VICODIN) 5-325 MG tablet Take 1 tablet by mouth every 6 (six) hours as needed for moderate pain.    [name] ibuprofen (ADVIL,MOTRIN) 800 MG tablet Take 800 mg by mouth every 8 (eight) hours as needed for moderate pain.     . pantoprazole (PROTONIX) 40 MG tablet TAKE 1 TABLET(40 MG) BY MOUTH DAILY 90 tablet 2  . sodium chloride (OCEAN) 0.65 % SOLN nasal  spray Place 2 sprays into both nostrils as needed for congestion.    [name] SYNTHROID 150 MCG tablet Take 150 mcg by mouth daily.   0  . tamsulosin (FLOMAX) 0.4 MG CAPS capsule Take 0.4 mg by mouth daily.    . nitroGLYCERIN (NITROSTAT) 0.4 MG SL tablet Place 0.4 mg under the tongue every 5 (five) minutes x 3 doses as needed for chest pain.      No current facility-administered medications for this encounter.     No Known Allergies  Social History   Social History  . Marital status: Widowed    Spouse name: [name]  . Number of children: [name]  . Years of education: [name]   Occupational History  . Not on file.   Social History Main Topics  . Smoking status: Former Smoker    Quit date: 05/23/1973  . Smokeless tobacco: Never Used  . Alcohol use 0.0 oz/week     Comment: RARE  . Drug use: No  . Sexual activity: Not on file   Other Topics Concern  . Not on file   Social History Narrative  . No narrative on file    Family History  Problem Relation Age of Onset  . Kidney Stones Mother     ROS- All systems are reviewed and negative except as per the HPI above  Physical Exam: Vitals:   05/02/16 0853  BP: 112/72  Pulse: (!) 114  Weight: 263 lb 9.6 oz (119.6 kg)  Height: 5\' 10"  (1.778 m)    GEN- The patient is well appearing, alert and oriented x 3 today.   Head- normocephalic, atraumatic Eyes-  Sclera clear, conjunctiva pink Ears- hearing intact Oropharynx- clear Neck- supple, no JVP Lymph- no cervical lymphadenopathy Lungs- Clear to ausculation bilaterally, normal work of breathing Heart- irregular rate and rhythm, no murmurs, rubs or gallops, PMI not laterally displaced GI- soft, NT, ND, + BS Extremities- no clubbing, cyanosis, or edema MS- no significant deformity or atrophy Skin- no rash or lesion Psych- euthymic mood, full affect Neuro- strength and sensation are intact  EKG afib at 118 bpm, qrs int 82 ms, qtc 438 ms Epic records reviewed Dr. Jackalyn Lombard noted reviewed     Assessment and Plan:    1. Persistent symptomatic afib He has returned to afib since last ablation for last 7-10 days States no missed doses of  eliquis Will schedule for [facility] Bmet/cbc   2. Acute diastolic dysfunction stable Continue  daily lasix    3. HTN Stable  4. Morbid obesity Weight loss  strongly advised  F/u one week  Butch Penny C. Talesha Ellithorpe, [facility] [address] [place], [place] [address] [phone]

## 2016-05-02 NOTE — Patient Instructions (Signed)
Cardioversion scheduled for Monday, January 8th  - Arrive at the [facility] and go to admitting at 930AM  -Do not eat or drink anything after midnight the night prior to your procedure.  - Take all your medication with a sip of water prior to arrival.  - You will not be able to drive home after your procedure.

## 2016-05-05 ENCOUNTER — Encounter (HOSPITAL_COMMUNITY)
Admission: RE | Disposition: A | Discharge status: Home / Self Care | Payer: Self-pay | Source: Ambulatory Visit | Attending: Cardiology

## 2016-05-05 ENCOUNTER — Ambulatory Visit (HOSPITAL_COMMUNITY): Payer: Medicare HMO | Admitting: Certified Registered"

## 2016-05-05 ENCOUNTER — Ambulatory Visit (HOSPITAL_COMMUNITY)
Admission: RE | Admit: 2016-05-05 | Discharge: 2016-05-05 | Disposition: A | Discharge status: Home / Self Care | Payer: Medicare HMO | Source: Ambulatory Visit | Attending: Cardiology | Admitting: Cardiology

## 2016-05-05 ENCOUNTER — Encounter (HOSPITAL_COMMUNITY): Payer: Self-pay | Admitting: Certified Registered"

## 2016-05-05 DIAGNOSIS — E039 Hypothyroidism, unspecified: Secondary | ICD-10-CM | POA: Insufficient documentation

## 2016-05-05 DIAGNOSIS — M109 Gout, unspecified: Secondary | ICD-10-CM | POA: Insufficient documentation

## 2016-05-05 DIAGNOSIS — N400 Enlarged prostate without lower urinary tract symptoms: Secondary | ICD-10-CM | POA: Insufficient documentation

## 2016-05-05 DIAGNOSIS — I4891 Unspecified atrial fibrillation: Secondary | ICD-10-CM | POA: Diagnosis not present

## 2016-05-05 DIAGNOSIS — Z841 Family history of disorders of kidney and ureter: Secondary | ICD-10-CM | POA: Diagnosis not present

## 2016-05-05 DIAGNOSIS — Z9841 Cataract extraction status, right eye: Secondary | ICD-10-CM | POA: Insufficient documentation

## 2016-05-05 DIAGNOSIS — I481 Persistent atrial fibrillation: Secondary | ICD-10-CM | POA: Diagnosis not present

## 2016-05-05 DIAGNOSIS — Z9842 Cataract extraction status, left eye: Secondary | ICD-10-CM | POA: Insufficient documentation

## 2016-05-05 DIAGNOSIS — Z87891 Personal history of nicotine dependence: Secondary | ICD-10-CM | POA: Diagnosis not present

## 2016-05-05 DIAGNOSIS — M1990 Unspecified osteoarthritis, unspecified site: Secondary | ICD-10-CM | POA: Insufficient documentation

## 2016-05-05 DIAGNOSIS — K219 Gastro-esophageal reflux disease without esophagitis: Secondary | ICD-10-CM | POA: Insufficient documentation

## 2016-05-05 DIAGNOSIS — I10 Essential (primary) hypertension: Secondary | ICD-10-CM | POA: Diagnosis not present

## 2016-05-05 DIAGNOSIS — R002 Palpitations: Secondary | ICD-10-CM | POA: Diagnosis not present

## 2016-05-05 DIAGNOSIS — Z96651 Presence of right artificial knee joint: Secondary | ICD-10-CM | POA: Diagnosis not present

## 2016-05-05 DIAGNOSIS — I2510 Atherosclerotic heart disease of native coronary artery without angina pectoris: Secondary | ICD-10-CM | POA: Diagnosis not present

## 2016-05-05 DIAGNOSIS — Z79899 Other long term (current) drug therapy: Secondary | ICD-10-CM | POA: Diagnosis not present

## 2016-05-05 DIAGNOSIS — Z9049 Acquired absence of other specified parts of digestive tract: Secondary | ICD-10-CM | POA: Diagnosis not present

## 2016-05-05 DIAGNOSIS — Z87442 Personal history of urinary calculi: Secondary | ICD-10-CM | POA: Insufficient documentation

## 2016-05-05 HISTORY — PX: CARDIOVERSION: SHX1299

## 2016-05-05 SURGERY — CARDIOVERSION
Anesthesia: General

## 2016-05-05 MED ORDER — LIDOCAINE 2% (20 MG/ML) 5 ML SYRINGE
INTRAMUSCULAR | Status: AC
  Filled 2016-05-05: qty 5

## 2016-05-05 MED ORDER — LIDOCAINE 2% (20 MG/ML) 5 ML SYRINGE
INTRAMUSCULAR | Status: DC | PRN
  Administered 2016-05-05: 60 mg via INTRAVENOUS

## 2016-05-05 MED ORDER — PROPOFOL 10 MG/ML IV BOLUS
INTRAVENOUS | Status: DC | PRN
  Administered 2016-05-05: 70 mg via INTRAVENOUS

## 2016-05-05 MED ORDER — PROPOFOL 10 MG/ML IV BOLUS
INTRAVENOUS | Status: AC
  Filled 2016-05-05: qty 20

## 2016-05-05 MED ORDER — SODIUM CHLORIDE 0.9 % IV SOLN
INTRAVENOUS | Status: DC
  Administered 2016-05-05: 500 mL via INTRAVENOUS

## 2016-05-05 NOTE — Anesthesia Postprocedure Evaluation (Signed)
Anesthesia Post Note  Patient: Wayne Ortiz  Procedure(s) Performed: Procedure(s) (LRB): CARDIOVERSION ([name])  Patient location during evaluation: PACU Anesthesia Type: General Level of consciousness: awake and alert Pain management: pain level controlled Vital Signs Assessment: post-procedure vital signs reviewed and stable Respiratory status: spontaneous breathing, nonlabored ventilation and respiratory function stable Cardiovascular status: blood pressure returned to baseline and stable Postop Assessment: no signs of nausea or vomiting Anesthetic complications: no       Last Vitals:  Vitals:   05/05/16 1030 05/05/16 1040  BP: 116/76 111/74  Pulse: 64 67  Resp: 16 17  Temp:      Last Pain:  Vitals:   05/05/16 0920  TempSrc: Oral                 Arabela Basaldua,W. EDMOND

## 2016-05-05 NOTE — Interval H&P Note (Signed)
History and Physical Interval Note:  05/05/2016 10:07 AM  Wayne Ortiz  has presented today for surgery, with the diagnosis of AFIB  The various methods of treatment have been discussed with the patient and family. After consideration of risks, benefits and other options for treatment, the patient has consented to  Procedure(s): CARDIOVERSION ([name]) as a surgical intervention .  The patient's history has been reviewed, patient examined, no change in status, stable for surgery.  I have reviewed the patient's chart and labs.  Questions were answered to the patient's satisfaction.     [place] [employer]

## 2016-05-05 NOTE — Procedures (Signed)
Electrical Cardioversion Procedure Note Wayne Ortiz [id number] 1946-01-27  Procedure: Electrical Cardioversion Indications:  Atrial Fibrillation.  He has not missed any Eliquis doses.   Procedure Details Consent: Risks of procedure as well as the alternatives and risks of each were explained to the (patient/caregiver).  Consent for procedure obtained. Time Out: Verified patient identification, verified procedure, site/side was marked, verified correct patient position, special equipment/implants available, medications/allergies/relevent history reviewed, required imaging and test results available.  Performed  Patient placed on cardiac monitor, pulse oximetry, supplemental oxygen as necessary.  Sedation given: Propofol per anesthesiology Pacer pads placed anterior and posterior chest.  Cardioverted 1 time(s).  Cardioverted at [place].  Evaluation Findings: Post procedure EKG shows: NSR Complications: None Patient did tolerate procedure well.   Loralie Champagne 05/05/2016, 10:10 AM

## 2016-05-05 NOTE — Transfer of Care (Signed)
Immediate Anesthesia Transfer of Care Note  Patient: Wayne Ortiz  Procedure(s) Performed: Procedure(s): CARDIOVERSION ([name])  Patient Location: PACU  Anesthesia Type:General  Level of Consciousness: awake, alert  and oriented  Airway & Oxygen Therapy: Patient Spontanous Breathing  Post-op Assessment: Report given to RN  Post vital signs: Reviewed and stable  Last Vitals:  Vitals:   05/05/16 1012 05/05/16 1013  BP: 97/71   Pulse: 65 65  Resp: 15 16  Temp:      Last Pain:  Vitals:   05/05/16 0920  TempSrc: Oral         Complications: No apparent anesthesia complications

## 2016-05-05 NOTE — H&P (View-Only) (Signed)
Patient ID: Wayne Ortiz, male   DOB: 1945-12-03, 71 y.o.   MRN: [id number]    Primary Care Physician: [name], MD Referring Physician: [name]   ASHE Ortiz is a 70 y.o. male with a h/o afib  in the clinic for f/u, 11/21. He had an ablation in 2016, has failed amiodarone with recent issues with recurrent afib. He was evaluated by Dr. [name] 10/13 and it was felt that he would benefit from repeat ablation. EKG shows afib at  104 bpm.  He feels better today than most.   F/u 05/02/16,pt is here to f/u ablation. He is in afib and feels he has been in afib since last week. No swallowing or groin issues. He does not feel as well in afib with fatigue and shortness of breath.  Today, he denies symptoms of palpitations, chest pain, shortness of breath, orthopnea, PND, lower extremity edema, dizziness, presyncope, syncope, or neurologic sequela. The patient is tolerating medications without difficulties and is otherwise without complaint today.   Past Medical History:  Diagnosis Date  . Arthritis   . Atrial fibrillation ([place])   . BPH (benign prostatic hypertrophy)   . CAD (coronary artery disease)    a. s/p DES 05/2014 at [facility]  . Complication of anesthesia    BECAME COMBATIVE  . Dysrhythmia   . Femur fracture ([place])   . GERD (gastroesophageal reflux disease)    occasional TUMS  . Gout   . History of gout   . History of kidney stones   . Hypertension   . Hypothyroidism   . Persistent atrial fibrillation ([place])   . Precancerous skin lesion    Past Surgical History:  Procedure Laterality Date  . arm fracture surgery Left   . CARDIOVERSION [name] 12/09/2014   Procedure: CARDIOVERSION;  Surgeon: [name], MD;  Location: [place];  Service: Cardiovascular;  Laterality: [name];  . CATARACTS     EXCISION  . CERVICAL SPINE SURGERY    . CHOLECYSTECTOMY [name] 01/03/2013   Procedure: LAPAROSCOPIC CHOLECYSTECTOMY;  Surgeon: [name], MD;  [name];  Service: General;  Laterality: [name];  .  ELECTROPHYSIOLOGIC STUDY [name] 03/15/2015   Procedure: Atrial Fibrillation Ablation;  Surgeon: [name], MD;  Location: [place] [facility];  Service: Cardiovascular;  Laterality: [name];  . ELECTROPHYSIOLOGIC STUDY [name] 04/01/2016   Procedure: Atrial Fibrillation Ablation;  Surgeon: [name], MD;  Location: [place] [facility];  Service: Cardiovascular;  Laterality: [name];  . ERCP [name] 01/02/2013   Procedure: ENDOSCOPIC RETROGRADE CHOLANGIOPANCREATOGRAPHY (ERCP);  Surgeon: [name], MD;  [name];  Service: Endoscopy;  Laterality: [name];  . FEMUR FRACTURE SURGERY    . LIVER BIOPSY [name] 01/03/2013   Procedure: LIVER BIOPSY;  Surgeon: [name], MD;  [name];  Service: General;  Laterality: [name];  . SPHINCTEROTOMY [name] 01/02/2013   Procedure: Joan Mayans;  Surgeon: [name], MD;  [name];  Service: Endoscopy;  Laterality: [name];  Stone Extraction  . TOTAL KNEE ARTHROPLASTY Right 05/30/2013   Procedure: RIGHT TOTAL KNEE ARTHROPLASTY;  Surgeon: [name], MD;  [name];  Service: Orthopedics;  Laterality: Right;    Current Outpatient Prescriptions  Medication Sig Dispense Refill  . allopurinol (ZYLOPRIM) 300 MG tablet Take 300 mg by mouth every morning.     [name] apixaban (ELIQUIS) 5 MG TABS tablet Take 1 tablet (5 mg total) by mouth 2 (two) times daily. 60 tablet 11  . diltiazem (CARDIZEM  CD) 120 MG 24 hr capsule Take 2 capsules (240 mg total) by mouth 2 (two) times daily. 360 capsule 3  . furosemide (LASIX) 40 MG tablet Take 1 tablet (40 mg total) by mouth daily. 90 tablet 3  . HYDROcodone-acetaminophen (NORCO/VICODIN) 5-325 MG tablet Take 1 tablet by mouth every 6 (six) hours as needed for moderate pain.    [name] ibuprofen (ADVIL,MOTRIN) 800 MG tablet Take 800 mg by mouth every 8 (eight) hours as needed for moderate pain.     . pantoprazole (PROTONIX) 40 MG tablet TAKE 1 TABLET(40 MG) BY MOUTH DAILY 90 tablet 2  . sodium chloride (OCEAN) 0.65 % SOLN nasal  spray Place 2 sprays into both nostrils as needed for congestion.    [name] SYNTHROID 150 MCG tablet Take 150 mcg by mouth daily.   0  . tamsulosin (FLOMAX) 0.4 MG CAPS capsule Take 0.4 mg by mouth daily.    . nitroGLYCERIN (NITROSTAT) 0.4 MG SL tablet Place 0.4 mg under the tongue every 5 (five) minutes x 3 doses as needed for chest pain.      No current facility-administered medications for this encounter.     No Known Allergies  Social History   Social History  . Marital status: Widowed    Spouse name: [name]  . Number of children: [name]  . Years of education: [name]   Occupational History  . Not on file.   Social History Main Topics  . Smoking status: Former Smoker    Quit date: 05/23/1973  . Smokeless tobacco: Never Used  . Alcohol use 0.0 oz/week     Comment: RARE  . Drug use: No  . Sexual activity: Not on file   Other Topics Concern  . Not on file   Social History Narrative  . No narrative on file    Family History  Problem Relation Age of Onset  . Kidney Stones Mother     ROS- All systems are reviewed and negative except as per the HPI above  Physical Exam: Vitals:   05/02/16 0853  BP: 112/72  Pulse: (!) 114  Weight: 263 lb 9.6 oz (119.6 kg)  Height: 5\' 10"  (1.778 m)    GEN- The patient is well appearing, alert and oriented x 3 today.   Head- normocephalic, atraumatic Eyes-  Sclera clear, conjunctiva pink Ears- hearing intact Oropharynx- clear Neck- supple, no JVP Lymph- no cervical lymphadenopathy Lungs- Clear to ausculation bilaterally, normal work of breathing Heart- irregular rate and rhythm, no murmurs, rubs or gallops, PMI not laterally displaced GI- soft, NT, ND, + BS Extremities- no clubbing, cyanosis, or edema MS- no significant deformity or atrophy Skin- no rash or lesion Psych- euthymic mood, full affect Neuro- strength and sensation are intact  EKG afib at 118 bpm, qrs int 82 ms, qtc 438 ms Epic records reviewed Dr. Jackalyn Lombard noted reviewed     Assessment and Plan:    1. Persistent symptomatic afib He has returned to afib since last ablation for last 7-10 days States no missed doses of  eliquis Will schedule for DCCV Bmet/cbc   2. Acute diastolic dysfunction stable Continue  daily lasix    3. HTN Stable  4. Morbid obesity Weight loss  strongly advised  F/u one week  Butch Penny C. Perel Hauschild, [facility] [address] [place], [place] [address] [phone]

## 2016-05-05 NOTE — Discharge Instructions (Signed)
Electrical Cardioversion, Care After °This sheet gives you information about how to care for yourself after your procedure. Your health care provider may also give you more specific instructions. If you have problems or questions, contact your health care provider. °What can I expect after the procedure? °After the procedure, it is common to have: °· Some redness on the skin where the shocks were given. °Follow these instructions at home: °· Do not drive for 24 hours if you were given a medicine to help you relax (sedative). °· Take over-the-counter and prescription medicines only as told by your health care provider. °· Ask your health care provider how to check your pulse. Check it often. °· Rest for 48 hours after the procedure or as told by your health care provider. °· Avoid or limit your caffeine use as told by your health care provider. °Contact a health care provider if: °· You feel like your heart is beating too quickly or your pulse is not regular. °· You have a serious muscle cramp that does not go away. °Get help right away if: °· You have discomfort in your chest. °· You are dizzy or you feel faint. °· You have trouble breathing or you are short of breath. °· Your speech is slurred. °· You have trouble moving an arm or leg on one side of your body. °· Your fingers or toes turn cold or blue. °This information is not intended to replace advice given to you by your health care provider. Make sure you discuss any questions you have with your health care provider. °Document Released: 02/02/2013 Document Revised: 11/16/2015 Document Reviewed: 10/19/2015 °Elsevier Interactive Patient Education © 2017 [employer] Inc[employer] ° °

## 2016-05-05 NOTE — Anesthesia Preprocedure Evaluation (Addendum)
Anesthesia Evaluation  Patient identified by MRN, date of birth, ID band Patient awake    Reviewed: Allergy & Precautions, H&P , NPO status , Patient's Chart, lab work & pertinent test results  Airway Mallampati: II  TM Distance: >3 FB Neck ROM: Full    Dental no notable dental hx. (+) Partial Lower, Partial Upper, Dental Advisory Given, Chipped   Pulmonary neg pulmonary ROS, former smoker,    Pulmonary exam normal breath sounds clear to auscultation       Cardiovascular hypertension, Pt. on medications + CAD and + Cardiac Stents  + dysrhythmias Atrial Fibrillation  Rhythm:Irregular Rate:Normal     Neuro/Psych negative neurological ROS  negative psych ROS   GI/Hepatic Neg liver ROS, GERD  Medicated and Controlled,  Endo/Other  Hypothyroidism   Renal/GU negative Renal ROS  negative genitourinary   Musculoskeletal  (+) Arthritis , Osteoarthritis,    Abdominal   Peds  Hematology negative hematology ROS (+)   Anesthesia Other Findings   Reproductive/Obstetrics negative OB ROS                            Anesthesia Physical Anesthesia Plan  ASA: III  Anesthesia Plan: General   Post-op Pain Management:    Induction: Intravenous  Airway Management Planned: Mask  Additional Equipment:   Intra-op Plan:   Post-operative Plan:   Informed Consent: I have reviewed the patients History and Physical, chart, labs and discussed the procedure including the risks, benefits and alternatives for the proposed anesthesia with the patient or authorized representative who has indicated his/her understanding and acceptance.   Dental advisory given  Plan Discussed with: CRNA  Anesthesia Plan Comments:         Anesthesia Quick Evaluation

## 2016-05-05 NOTE — Interval H&P Note (Signed)
History and Physical Interval Note:  05/05/2016 10:45 AM  Wayne Ortiz  has presented today for surgery, with the diagnosis of AFIB  The various methods of treatment have been discussed with the patient and family. After consideration of risks, benefits and other options for treatment, the patient has consented to  Procedure(s): CARDIOVERSION ([name]) as a surgical intervention .  The patient's history has been reviewed, patient examined, no change in status, stable for surgery.  I have reviewed the patient's chart and labs.  Questions were answered to the patient's satisfaction.     Cloyd Ragas [employer]

## 2016-05-12 ENCOUNTER — Encounter (HOSPITAL_COMMUNITY): Payer: Self-pay | Admitting: Nurse Practitioner

## 2016-05-12 ENCOUNTER — Ambulatory Visit (HOSPITAL_COMMUNITY)
Admission: RE | Admit: 2016-05-12 | Discharge: 2016-05-12 | Disposition: A | Discharge status: Home / Self Care | Payer: Medicare HMO | Source: Ambulatory Visit | Attending: Nurse Practitioner | Admitting: Nurse Practitioner

## 2016-05-12 VITALS — BP 158/86 | HR 64 | Ht 70.0 in | Wt 265.4 lb

## 2016-05-12 DIAGNOSIS — Z7901 Long term (current) use of anticoagulants: Secondary | ICD-10-CM | POA: Diagnosis not present

## 2016-05-12 DIAGNOSIS — I2510 Atherosclerotic heart disease of native coronary artery without angina pectoris: Secondary | ICD-10-CM | POA: Insufficient documentation

## 2016-05-12 DIAGNOSIS — N400 Enlarged prostate without lower urinary tract symptoms: Secondary | ICD-10-CM | POA: Insufficient documentation

## 2016-05-12 DIAGNOSIS — Z87891 Personal history of nicotine dependence: Secondary | ICD-10-CM | POA: Diagnosis not present

## 2016-05-12 DIAGNOSIS — Z79899 Other long term (current) drug therapy: Secondary | ICD-10-CM | POA: Diagnosis not present

## 2016-05-12 DIAGNOSIS — K219 Gastro-esophageal reflux disease without esophagitis: Secondary | ICD-10-CM | POA: Insufficient documentation

## 2016-05-12 DIAGNOSIS — Z955 Presence of coronary angioplasty implant and graft: Secondary | ICD-10-CM | POA: Diagnosis not present

## 2016-05-12 DIAGNOSIS — M109 Gout, unspecified: Secondary | ICD-10-CM | POA: Diagnosis not present

## 2016-05-12 DIAGNOSIS — Z6838 Body mass index (BMI) 38.0-38.9, adult: Secondary | ICD-10-CM | POA: Insufficient documentation

## 2016-05-12 DIAGNOSIS — Z96651 Presence of right artificial knee joint: Secondary | ICD-10-CM | POA: Diagnosis not present

## 2016-05-12 DIAGNOSIS — E039 Hypothyroidism, unspecified: Secondary | ICD-10-CM | POA: Diagnosis not present

## 2016-05-12 DIAGNOSIS — I4891 Unspecified atrial fibrillation: Secondary | ICD-10-CM | POA: Diagnosis present

## 2016-05-12 DIAGNOSIS — I481 Persistent atrial fibrillation: Secondary | ICD-10-CM | POA: Insufficient documentation

## 2016-05-12 DIAGNOSIS — I4819 Other persistent atrial fibrillation: Secondary | ICD-10-CM

## 2016-05-12 DIAGNOSIS — I10 Essential (primary) hypertension: Secondary | ICD-10-CM | POA: Insufficient documentation

## 2016-05-12 NOTE — Progress Notes (Signed)
Patient ID: Wayne Ortiz, male   DOB: [birth year], 71 y.o.   MRN: [id number]    Primary Care Physician: Wayne Gravel, MD Referring Physician: D. Ortiz   Wayne Ortiz is a 71 y.o. male with a h/o afib . He had an ablation in 2016,  failed amiodarone with recent issues with recurrent afib. He was evaluated by Dr. Rayann Ortiz 10/13 and it was felt that he would benefit from repeat ablation which he had 05/02/16. On f/u he was in afib and had been in since the week before. He was set up for a cardioversion 1/8, which was successful and on f/u today, he is staying in [place]. He continues with apixaban without missed doses.  Today, he denies symptoms of palpitations, chest Ortiz, shortness of breath, orthopnea, PND, lower extremity edema, dizziness, presyncope, syncope, or neurologic sequela. The patient is tolerating medications without difficulties and is otherwise without complaint today.   Past Medical History:  Diagnosis Date  . Arthritis   . Atrial fibrillation ([place])   . BPH (benign prostatic hypertrophy)   . CAD (coronary artery disease)    a. s/p DES 05/2014 at [facility]  . Complication of anesthesia    BECAME COMBATIVE  . Dysrhythmia   . Femur fracture ([place])   . GERD (gastroesophageal reflux disease)    occasional TUMS  . Gout   . History of gout   . History of kidney stones   . Hypertension   . Hypothyroidism   . Persistent atrial fibrillation ([place])   . Precancerous skin lesion    Past Surgical History:  Procedure Laterality Date  . arm fracture surgery Left   . CARDIOVERSION [name] 12/09/2014   Procedure: CARDIOVERSION;  Surgeon: Wayne Pain, MD;  [facility];  Service: Cardiovascular;  Laterality: [name];  . CARDIOVERSION [name] 05/05/2016   Procedure: CARDIOVERSION;  Surgeon: Wayne Dresser, MD;  Location: [place];  Service: Cardiovascular;  Laterality: [name];  . CATARACTS     EXCISION  . CERVICAL SPINE SURGERY    . CHOLECYSTECTOMY [name] 01/03/2013   Procedure: LAPAROSCOPIC CHOLECYSTECTOMY;   Surgeon: Wayne So, MD;  [name];  Service: General;  Laterality: [name];  . ELECTROPHYSIOLOGIC STUDY [name] 03/15/2015   Procedure: Atrial Fibrillation Ablation;  Surgeon: Wayne Grayer, MD;  Location: [place] CV LAB;  Service: Cardiovascular;  Laterality: [name];  . ELECTROPHYSIOLOGIC STUDY [name] 04/01/2016   Procedure: Atrial Fibrillation Ablation;  Surgeon: Wayne Grayer, MD;  Location: [place] [facility] LAB;  Service: Cardiovascular;  Laterality: [name];  . ERCP [name] 01/02/2013   Procedure: ENDOSCOPIC RETROGRADE CHOLANGIOPANCREATOGRAPHY (ERCP);  Surgeon: Wayne Houston, MD;  Location[name];  Service: Endoscopy;  Laterality: [name];  . FEMUR FRACTURE SURGERY    . LIVER BIOPSY [name] 01/03/2013   Procedure: LIVER BIOPSY;  Surgeon: Wayne So, MD;  Location[name];  Service: General;  Laterality: [name];  . SPHINCTEROTOMY [name] 01/02/2013   Procedure: Joan Mayans;  Surgeon: Wayne Houston, MD;  Location: [name];  Service: Endoscopy;  Laterality: [name];  Stone Extraction  . TOTAL KNEE ARTHROPLASTY Right 05/30/2013   Procedure: RIGHT TOTAL KNEE ARTHROPLASTY;  Surgeon: Wayne Pole, MD;  [name];  Service: Orthopedics;  Laterality: Right;    Current Outpatient Prescriptions  Medication Sig Dispense Refill  . allopurinol (ZYLOPRIM) 300 MG tablet Take 300 mg by mouth every morning.     Wayne Ortiz apixaban (ELIQUIS) 5 MG TABS tablet Take 1 tablet (5 mg total) by mouth 2 (two) times  daily. 60 tablet 11  . diltiazem (CARDIZEM CD) 120 MG 24 hr capsule Take 2 capsules (240 mg total) by mouth 2 (two) times daily. 360 capsule 3  . furosemide (LASIX) 40 MG tablet Take 1 tablet (40 mg total) by mouth daily. 90 tablet 3  . HYDROcodone-acetaminophen (NORCO/VICODIN) 5-325 MG tablet Take 1 tablet by mouth every 6 (six) hours as needed for moderate Ortiz.    Wayne Ortiz ibuprofen (ADVIL,MOTRIN) 800 MG tablet Take 800 mg by mouth every 8 (eight) hours as needed for moderate Ortiz.     . pantoprazole (PROTONIX) 40 MG tablet TAKE 1  TABLET(40 MG) BY MOUTH DAILY 90 tablet 2  . sodium chloride (OCEAN) 0.65 % SOLN nasal spray Place 2 sprays into both nostrils as needed for congestion.    Wayne Ortiz SYNTHROID 150 MCG tablet Take 150 mcg by mouth daily.   0  . tamsulosin (FLOMAX) 0.4 MG CAPS capsule Take 0.4 mg by mouth daily.    . nitroGLYCERIN (NITROSTAT) 0.4 MG SL tablet Place 0.4 mg under the tongue every 5 (five) minutes x 3 doses as needed for chest Ortiz.      No current facility-administered medications for this encounter.     No Known Allergies  Social History   Social History  . Marital status: Widowed    Spouse name: [name]  . Number of children: [name]  . Years of education: [name]   Occupational History  . Not on file.   Social History Main Topics  . Smoking status: Former Smoker    Quit date: 05/23/1973  . Smokeless tobacco: Never Used  . Alcohol use 0.0 oz/week     Comment: RARE  . Drug use: No  . Sexual activity: Not on file   Other Topics Concern  . Not on file   Social History Narrative  . No narrative on file    Family History  Problem Relation Age of Onset  . Kidney Stones Mother     ROS- All systems are reviewed and negative except as per the HPI above  Physical Exam: Vitals:   05/12/16 0928  BP: (!) 158/86  Pulse: 64  Weight: 265 lb 6.4 oz (120.4 kg)  Height: 5\' 10"  (1.778 m)    GEN- The patient is well appearing, alert and oriented x 3 today.   Head- normocephalic, atraumatic Eyes-  Sclera clear, conjunctiva pink Ears- hearing intact Oropharynx- clear Neck- supple, no JVP Lymph- no cervical lymphadenopathy Lungs- Clear to ausculation bilaterally, normal work of breathing Heart- regular rate and rhythm, no murmurs, rubs or gallops, PMI not laterally displaced GI- soft, NT, ND, + BS Extremities- no clubbing, cyanosis, or edema MS- no significant deformity or atrophy Skin- no rash or lesion Psych- euthymic mood, full affect Neuro- strength and sensation are intact  EKG - SR at 64  bpm, pr int 188 ms, qrs int 92 ms, qtc 414 ms Epic records reviewed Dr. Jackalyn Lombard noted reviewed   Assessment and Plan:    1. Persistent symptomatic afib s/p ablation 04/01/16 Successful cardioversion 05/05/16 Maintaining SR Continue diltazem Continue eliquis without missed doses  2. Acute diastolic dysfunction stable Continue  daily lasix    3. HTN Stable  4. Morbid obesity Weight loss  strongly advised  F/u with Dr. Rayann Ortiz 06/30/16 afib clinic as needed  Geroge Baseman. Dontarious Schaum, [facility] [address] [place], [place] [address] [phone]

## 2016-06-10 ENCOUNTER — Telehealth (HOSPITAL_COMMUNITY): Payer: Self-pay | Admitting: *Deleted

## 2016-06-10 NOTE — Telephone Encounter (Signed)
Patient approved for eliquis patient assistance [id number] application [id number]

## 2016-06-24 ENCOUNTER — Telehealth (HOSPITAL_COMMUNITY): Payer: Self-pay | Admitting: *Deleted

## 2016-06-24 NOTE — Telephone Encounter (Signed)
Patient called in stating he was not feeling well HR was in the low 100s and did not sleep well last night. Feels he is back in afib today. Does say he is short of breath but he is always short of breath. States he does feel to be carrying extra fluid although he does not weigh himself. He took his normal medications at 6am. On cardizem 240mg  BID. Discussed with Roderic Palau NP -- will take extra lasix 20mg  this afternoon and normal cardizem dose tonight. Instructed to call tomorrow with update. Pt sees Dr. [name] on 3/5.

## 2016-06-25 NOTE — Telephone Encounter (Signed)
Pt called in today stating he was feeling some better. HR is better controlled in 90-105 range. He decided to take an extra 40mg  of lasix this morning and his BP is 95/60 now. He also took nyquil HBP earlier today for sinus congestion -- educated pt to avoid combination medications. Pt verbalized understanding will call back if further issues otherwise will follow up with Dr. [name] next week.

## 2016-06-26 DIAGNOSIS — Z79891 Long term (current) use of opiate analgesic: Secondary | ICD-10-CM | POA: Diagnosis not present

## 2016-06-26 DIAGNOSIS — E669 Obesity, unspecified: Secondary | ICD-10-CM | POA: Diagnosis not present

## 2016-06-26 DIAGNOSIS — R009 Unspecified abnormalities of heart beat: Secondary | ICD-10-CM | POA: Diagnosis not present

## 2016-06-26 DIAGNOSIS — Z0000 Encounter for general adult medical examination without abnormal findings: Secondary | ICD-10-CM | POA: Diagnosis not present

## 2016-06-26 DIAGNOSIS — I2510 Atherosclerotic heart disease of native coronary artery without angina pectoris: Secondary | ICD-10-CM | POA: Diagnosis not present

## 2016-06-26 DIAGNOSIS — Z85818 Personal history of malignant neoplasm of other sites of lip, oral cavity, and pharynx: Secondary | ICD-10-CM | POA: Diagnosis not present

## 2016-06-26 DIAGNOSIS — I4891 Unspecified atrial fibrillation: Secondary | ICD-10-CM | POA: Diagnosis not present

## 2016-06-26 DIAGNOSIS — I10 Essential (primary) hypertension: Secondary | ICD-10-CM | POA: Diagnosis not present

## 2016-06-26 DIAGNOSIS — N400 Enlarged prostate without lower urinary tract symptoms: Secondary | ICD-10-CM | POA: Diagnosis not present

## 2016-06-26 DIAGNOSIS — M5440 Lumbago with sciatica, unspecified side: Secondary | ICD-10-CM | POA: Diagnosis not present

## 2016-06-26 DIAGNOSIS — E039 Hypothyroidism, unspecified: Secondary | ICD-10-CM | POA: Diagnosis not present

## 2016-06-26 DIAGNOSIS — M109 Gout, unspecified: Secondary | ICD-10-CM | POA: Diagnosis not present

## 2016-06-30 ENCOUNTER — Ambulatory Visit (INDEPENDENT_AMBULATORY_CARE_PROVIDER_SITE_OTHER): Payer: Medicare HMO | Admitting: Internal Medicine

## 2016-06-30 ENCOUNTER — Encounter (INDEPENDENT_AMBULATORY_CARE_PROVIDER_SITE_OTHER): Payer: Self-pay

## 2016-06-30 ENCOUNTER — Encounter: Payer: Self-pay | Admitting: Internal Medicine

## 2016-06-30 VITALS — BP 140/82 | HR 84 | Ht 71.0 in | Wt 260.0 lb

## 2016-06-30 DIAGNOSIS — Z9861 Coronary angioplasty status: Secondary | ICD-10-CM | POA: Diagnosis not present

## 2016-06-30 DIAGNOSIS — Q245 Malformation of coronary vessels: Secondary | ICD-10-CM | POA: Diagnosis not present

## 2016-06-30 DIAGNOSIS — I4819 Other persistent atrial fibrillation: Secondary | ICD-10-CM

## 2016-06-30 DIAGNOSIS — R0602 Shortness of breath: Secondary | ICD-10-CM

## 2016-06-30 DIAGNOSIS — I10 Essential (primary) hypertension: Secondary | ICD-10-CM | POA: Diagnosis not present

## 2016-06-30 DIAGNOSIS — I481 Persistent atrial fibrillation: Secondary | ICD-10-CM

## 2016-06-30 DIAGNOSIS — I2510 Atherosclerotic heart disease of native coronary artery without angina pectoris: Secondary | ICD-10-CM | POA: Diagnosis not present

## 2016-06-30 LAB — CBC WITH DIFFERENTIAL/PLATELET
BASOS ABS: 0 10*3/uL (ref 0.0–0.2)
Basos: 0 %
EOS (ABSOLUTE): 0.2 10*3/uL (ref 0.0–0.4)
Eos: 3 %
Hematocrit: 39.6 % (ref 37.5–51.0)
Hemoglobin: 12.4 g/dL — ABNORMAL LOW (ref 13.0–17.7)
IMMATURE GRANS (ABS): 0.1 10*3/uL (ref 0.0–0.1)
Immature Granulocytes: 1 %
LYMPHS ABS: 1.8 10*3/uL (ref 0.7–3.1)
Lymphs: 25 %
MCH: 26 pg — AB (ref 26.6–33.0)
MCHC: 31.3 g/dL — AB (ref 31.5–35.7)
MCV: 83 fL (ref 79–97)
Monocytes Absolute: 0.7 10*3/uL (ref 0.1–0.9)
Monocytes: 11 %
Neutrophils Absolute: 4.2 10*3/uL (ref 1.4–7.0)
Neutrophils: 60 %
PLATELETS: 166 10*3/uL (ref 150–379)
RBC: 4.77 x10E6/uL (ref 4.14–5.80)
RDW: 16.3 % — ABNORMAL HIGH (ref 12.3–15.4)
WBC: 6.9 10*3/uL (ref 3.4–10.8)

## 2016-06-30 LAB — BASIC METABOLIC PANEL
BUN/Creatinine Ratio: 12 (ref 10–24)
BUN: 16 mg/dL (ref 8–27)
CALCIUM: 9.1 mg/dL (ref 8.6–10.2)
CO2: 20 mmol/L (ref 18–29)
CREATININE: 1.3 mg/dL — AB (ref 0.76–1.27)
Chloride: 103 mmol/L (ref 96–106)
GFR calc Af Amer: 63 mL/min/{1.73_m2} (ref >59)
GFR calc non Af Amer: 55 mL/min/{1.73_m2} — ABNORMAL LOW (ref >59)
GLUCOSE: 105 mg/dL — AB (ref 65–99)
Potassium: 4.2 mmol/L (ref 3.5–5.2)
SODIUM: 142 mmol/L (ref 134–144)

## 2016-06-30 LAB — HEPATIC FUNCTION PANEL
ALT: 15 IU/L (ref 0–44)
AST: 16 IU/L (ref 0–40)
Albumin: 4.1 g/dL (ref 3.5–4.8)
Alkaline Phosphatase: 110 IU/L (ref 39–117)
Bilirubin Total: 0.6 mg/dL (ref 0.0–1.2)
Bilirubin, Direct: 0.15 mg/dL (ref 0.00–0.40)
TOTAL PROTEIN: 6.8 g/dL (ref 6.0–8.5)

## 2016-06-30 LAB — LIPID PANEL
CHOLESTEROL TOTAL: 183 mg/dL (ref 100–199)
Chol/HDL Ratio: 4.5 ratio units (ref 0.0–5.0)
HDL: 41 mg/dL (ref >39)
LDL Calculated: 118 mg/dL — ABNORMAL HIGH (ref 0–99)
Triglycerides: 122 mg/dL (ref 0–149)
VLDL Cholesterol Cal: 24 mg/dL (ref 5–40)

## 2016-06-30 LAB — PROTIME-INR
INR: 1.1 (ref 0.8–1.2)
PROTHROMBIN TIME: 11.5 s (ref 9.1–12.0)

## 2016-06-30 LAB — TSH: TSH: 2.32 u[IU]/mL (ref 0.450–4.500)

## 2016-06-30 NOTE — Patient Instructions (Addendum)
Medication Instructions:  Your physician has recommended you make the following change in your medication:  1) Stop Protonix     Labwork: Your physician recommends that you return for lab work today: BMP/CBC/INR   Testing/Procedures: Your physician has requested that you have a cardiac catheterization. Cardiac catheterization is used to diagnose and/or treat various heart conditions. Doctors may recommend this procedure for a number of different reasons. The most common reason is to evaluate chest pain. Chest pain can be a symptom of coronary artery disease (CAD), and cardiac catheterization can show whether plaque is narrowing or blocking your heart's arteries. This procedure is also used to evaluate the valves, as well as measure the blood flow and oxygen levels in different parts of your heart. For further information please visit [email or web address]. Please follow instruction sheet, as given.---07/04/16  Please arrive at [facility] at 5:30am Do not eat or drink after midnight the night prior to the procedure HOLD Eliquis 48 hours prior to the procedure---last dose 07/01/16 May take medications the Morning of the procedure if needed with a small sip of water--DO NOT [place]  Your physician has recommended that you have a pulmonary function test. Pulmonary Function Tests are a group of tests that measure how well air moves in and out of your lungs.      Follow-Up: Your physician recommends that you schedule a follow-up appointment in: 3 weeks with Dr [name]   Any Other Special Instructions Will Be Listed Below (If Applicable).     If you need a refill on your cardiac medications before your next appointment, please call your pharmacy.

## 2016-06-30 NOTE — Progress Notes (Signed)
 Electrophysiology Office Note   Date:  06/30/2016   ID:  Wayne Ortiz, DOB 05/31/1945, MRN [id number]  PCP:  Era Parr Kim, MD  Cardiologist:  Dr Branch Primary Electrophysiologist: Olney Monier, MD    Chief Complaint  Patient presents with  . Atrial Fibrillation     History of Present Illness: Wayne Ortiz is a 71 y.o. male who presents today for electrophysiology evaluation.   He has h/o CAD- recent DES to LCX and DES x2 to RCA at [facility] 05/2014 and persistent afib.  He previously failed medical therapy with amiodarone and sotalol.  He underwent afib ablation by me 11/16 and again 12/17.  He continues to have SOB and decreased exercise tolerance.  He is very concerned with this.  Today, he denies symptoms of palpitations, chest pain, orthopnea, PND, lower extremity edema, claudication, dizziness, presyncope, syncope, bleeding, or neurologic sequela. The patient is tolerating medications without difficulties and is otherwise without complaint today.    Past Medical History:  Diagnosis Date  . Arthritis   . Atrial fibrillation (HCC)   . BPH (benign prostatic hypertrophy)   . CAD (coronary artery disease)    a. s/p DES 05/2014 at [facility]  . Complication of anesthesia    BECAME COMBATIVE  . Dysrhythmia   . Femur fracture (HCC)   . GERD (gastroesophageal reflux disease)    occasional TUMS  . Gout   . History of gout   . History of kidney stones   . Hypertension   . Hypothyroidism   . Persistent atrial fibrillation (HCC)   . Precancerous skin lesion    Past Surgical History:  Procedure Laterality Date  . arm fracture surgery Left   . CARDIOVERSION [name] 12/09/2014   Procedure: CARDIOVERSION;  Surgeon: Mark C Skains, MD;  Location: [facility];  Service: Cardiovascular;  Laterality: [name];  . CARDIOVERSION [name] 05/05/2016   Procedure: CARDIOVERSION;  Surgeon: Dalton S McLean, MD;  Location: [facility] ENDOSCOPY;  Service: Cardiovascular;  Laterality: [name];  . CATARACTS     EXCISION  . CERVICAL SPINE  SURGERY    . CHOLECYSTECTOMY [name] 01/03/2013   Procedure: LAPAROSCOPIC CHOLECYSTECTOMY;  Surgeon: Mark A Jenkins, MD;  Location: [name];  Service: General;  Laterality: [name];  . ELECTROPHYSIOLOGIC STUDY [name] 03/15/2015   Procedure: Atrial Fibrillation Ablation;  Surgeon: Kiwana Deblasi, MD;  Location: [facility] INVASIVE CV LAB;  Service: Cardiovascular;  Laterality: [name];  . ELECTROPHYSIOLOGIC STUDY [name] 04/01/2016   Procedure: Atrial Fibrillation Ablation;  Surgeon: Ahmed Inniss, MD;  [facility];  Service: Cardiovascular;  Laterality: [name];  . ERCP [name] 01/02/2013   Procedure: ENDOSCOPIC RETROGRADE CHOLANGIOPANCREATOGRAPHY (ERCP);  Surgeon: Najeeb U Rehman, MD;  Location: [name];  Service: Endoscopy;  Laterality: [name];  . FEMUR FRACTURE SURGERY    . LIVER BIOPSY [name] 01/03/2013   Procedure: LIVER BIOPSY;  Surgeon: Mark A Jenkins, MD;  Location: [name];  Service: General;  Laterality: [name];  . SPHINCTEROTOMY [name] 01/02/2013   Procedure: SPHINCTEROTOMY;  Surgeon: Najeeb U Rehman, MD;  Location: [name];  Service: Endoscopy;  Laterality: [name];  Stone Extraction  . TOTAL KNEE ARTHROPLASTY Right 05/30/2013   Procedure: RIGHT TOTAL KNEE ARTHROPLASTY;  Surgeon: Matthew D Olin, MD;  Location: [name];  Service: Orthopedics;  Laterality: Right;     Current Outpatient Prescriptions  Medication Sig Dispense Refill  . allopurinol (ZYLOPRIM) 300 MG tablet Take 300 mg by mouth every morning.     . apixaban (ELIQUIS) 5 MG TABS tablet Take 1   tablet (5 mg total) by mouth 2 (two) times daily. 60 tablet 11  . diltiazem (CARDIZEM CD) 120 MG 24 hr capsule Take 2 capsules (240 mg total) by mouth 2 (two) times daily. 360 capsule 3  . furosemide (LASIX) 40 MG tablet Take 1 tablet (40 mg total) by mouth daily. 90 tablet 3  . HYDROcodone-acetaminophen (NORCO/VICODIN) 5-325 MG tablet Take 1 tablet by mouth every 6 (six) hours as needed for moderate pain.    . ibuprofen (ADVIL,MOTRIN) 800 MG tablet Take 800 mg by mouth every 8  (eight) hours as needed for moderate pain.     . pantoprazole (PROTONIX) 40 MG tablet TAKE 1 TABLET(40 MG) BY MOUTH DAILY 90 tablet 2  . sodium chloride (OCEAN) 0.65 % SOLN nasal spray Place 2 sprays into both nostrils as needed for congestion (Use as directed).     . SYNTHROID 150 MCG tablet Take 150 mcg by mouth daily.   0  . tamsulosin (FLOMAX) 0.4 MG CAPS capsule Take 0.4 mg by mouth daily.    . nitroGLYCERIN (NITROSTAT) 0.4 MG SL tablet Place 0.4 mg under the tongue every 5 (five) minutes x 3 doses as needed for chest pain.      No current facility-administered medications for this visit.     Allergies:   Patient has no known allergies.   Social History:  The patient  reports that he quit smoking about 43 years ago. He has never used smokeless tobacco. He reports that he drinks alcohol. He reports that he does not use drugs.   Family History:  The patient's  family history includes Kidney Stones in his mother.    ROS:  Please see the history of present illness.   All other systems are reviewed and negative.    PHYSICAL EXAM: VS:  BP 140/82   Pulse 84   Ht 5' 11" (1.803 m)   Wt 260 lb (117.9 kg)   SpO2 93%   BMI 36.26 kg/m  , BMI Body mass index is 36.26 kg/m. GEN: overweight, in no acute distress  HEENT: normal  Neck: no JVD, carotid bruits, or masses Cardiac: irregular rhythm no murmurs, rubs, or gallops,no edema  Respiratory:  clear to auscultation bilaterally, normal work of breathing GI: soft, nontender, nondistended, + BS MS: no deformity or atrophy  Skin: warm and dry  Neuro:  Strength and sensation are intact Psych: euthymic mood, full affect  EKG:  EKG is ordered today. The ekg ordered today shows afib,  V rate 84 bpm, septal infarct  Recent Labs: 03/18/2016: TSH 0.856 05/02/2016: BUN 13; Creatinine, Ser 1.32; Hemoglobin 17.1; Platelets 147; Potassium 3.9; Sodium 141    Lipid Panel     Component Value Date/Time   CHOL 164 08/08/2014 0711   TRIG 104  08/08/2014 0711   HDL 41 08/08/2014 0711   CHOLHDL 4.0 08/08/2014 0711   VLDL 21 08/08/2014 0711   LDLCALC 102 (H) 08/08/2014 0711     Wt Readings from Last 3 Encounters:  06/30/16 260 lb (117.9 kg)  05/12/16 265 lb 6.4 oz (120.4 kg)  05/02/16 263 lb 9.6 oz (119.6 kg)      ASSESSMENT AND PLAN:  1.  Persistent atrial fibrillation ERAF post ablation Will evaluate SOB (below) and then consider hospitalization upon return for initiation of tikosyn if still in AF   2. SOB Unclear etiology Possibly due to afib, though he does not think that he was better with sinus rhythm recently Would advise proceed with RHC/LHC to   evaluate SOB.   The patient understands that risks included but are not limited to stroke (1 in 1000), death (1 in 1000), kidney failure [usually temporary] (1 in 500), bleeding (1 in 200), allergic reaction [possibly serious] (1 in 200). The patient understands and agrees to proceed.  Will hold eliquis for 48 hours prior to cath  PFTs also ordered to evaluate SOB Regular exercise, weight loss also discussed at length today   3. HTN Stable No change required today  4. Morbid obesity Body mass index is 36.26 kg/m. He has made no effort at lifestyle modification  Current medicines are reviewed at length with the patient today.   The patient does not have concerns regarding his medicines.  The following changes were made today:  None   Return to see me in 3 weeks  Very complicated patient.  At risks for decompensation/ hospitalization.  A high level of decision making is required for this visit  Signed, Emmilyn Crooke, MD  06/30/2016 10:05 AM     [facility] [address] [place] [address] [phone] (office) [phone] (fax) 

## 2016-07-04 ENCOUNTER — Encounter (HOSPITAL_COMMUNITY)
Admission: RE | Disposition: A | Discharge status: Home / Self Care | Payer: Self-pay | Source: Ambulatory Visit | Attending: Interventional Cardiology

## 2016-07-04 ENCOUNTER — Ambulatory Visit (HOSPITAL_COMMUNITY)
Admission: RE | Admit: 2016-07-04 | Discharge: 2016-07-04 | Disposition: A | Discharge status: Home / Self Care | Payer: Medicare HMO | Source: Ambulatory Visit | Attending: Interventional Cardiology | Admitting: Interventional Cardiology

## 2016-07-04 ENCOUNTER — Encounter (HOSPITAL_COMMUNITY): Payer: Self-pay | Admitting: Interventional Cardiology

## 2016-07-04 DIAGNOSIS — K219 Gastro-esophageal reflux disease without esophagitis: Secondary | ICD-10-CM | POA: Diagnosis not present

## 2016-07-04 DIAGNOSIS — M109 Gout, unspecified: Secondary | ICD-10-CM | POA: Diagnosis not present

## 2016-07-04 DIAGNOSIS — I4819 Other persistent atrial fibrillation: Secondary | ICD-10-CM | POA: Diagnosis present

## 2016-07-04 DIAGNOSIS — I481 Persistent atrial fibrillation: Secondary | ICD-10-CM | POA: Diagnosis present

## 2016-07-04 DIAGNOSIS — Z79899 Other long term (current) drug therapy: Secondary | ICD-10-CM | POA: Insufficient documentation

## 2016-07-04 DIAGNOSIS — Z87891 Personal history of nicotine dependence: Secondary | ICD-10-CM | POA: Insufficient documentation

## 2016-07-04 DIAGNOSIS — Z87442 Personal history of urinary calculi: Secondary | ICD-10-CM | POA: Diagnosis not present

## 2016-07-04 DIAGNOSIS — Z9861 Coronary angioplasty status: Secondary | ICD-10-CM

## 2016-07-04 DIAGNOSIS — I10 Essential (primary) hypertension: Secondary | ICD-10-CM | POA: Insufficient documentation

## 2016-07-04 DIAGNOSIS — Z6836 Body mass index (BMI) 36.0-36.9, adult: Secondary | ICD-10-CM | POA: Diagnosis not present

## 2016-07-04 DIAGNOSIS — Z841 Family history of disorders of kidney and ureter: Secondary | ICD-10-CM | POA: Insufficient documentation

## 2016-07-04 DIAGNOSIS — N400 Enlarged prostate without lower urinary tract symptoms: Secondary | ICD-10-CM | POA: Insufficient documentation

## 2016-07-04 DIAGNOSIS — E039 Hypothyroidism, unspecified: Secondary | ICD-10-CM | POA: Insufficient documentation

## 2016-07-04 DIAGNOSIS — R0600 Dyspnea, unspecified: Secondary | ICD-10-CM | POA: Diagnosis not present

## 2016-07-04 DIAGNOSIS — I2510 Atherosclerotic heart disease of native coronary artery without angina pectoris: Secondary | ICD-10-CM | POA: Diagnosis not present

## 2016-07-04 DIAGNOSIS — Z9049 Acquired absence of other specified parts of digestive tract: Secondary | ICD-10-CM | POA: Insufficient documentation

## 2016-07-04 DIAGNOSIS — Z96651 Presence of right artificial knee joint: Secondary | ICD-10-CM | POA: Diagnosis not present

## 2016-07-04 DIAGNOSIS — Z955 Presence of coronary angioplasty implant and graft: Secondary | ICD-10-CM | POA: Diagnosis not present

## 2016-07-04 DIAGNOSIS — R0609 Other forms of dyspnea: Secondary | ICD-10-CM | POA: Diagnosis not present

## 2016-07-04 HISTORY — PX: RIGHT/LEFT HEART CATH AND CORONARY ANGIOGRAPHY: CATH118266

## 2016-07-04 LAB — POCT I-STAT 3, VENOUS BLOOD GAS (G3P V)
Acid-Base Excess: 1 mmol/L (ref 0.0–2.0)
BICARBONATE: 26.3 mmol/L (ref 20.0–28.0)
O2 Saturation: 70 %
PCO2 VEN: 45.1 mmHg (ref 44.0–60.0)
PH VEN: 7.374 (ref 7.250–7.430)
PO2 VEN: 38 mmHg (ref 32.0–45.0)
TCO2: 28 mmol/L (ref 0–100)

## 2016-07-04 LAB — POCT I-STAT 3, ART BLOOD GAS (G3+)
Acid-Base Excess: 3 mmol/L — ABNORMAL HIGH (ref 0.0–2.0)
Bicarbonate: 27.2 mmol/L (ref 20.0–28.0)
O2 SAT: 99 %
PCO2 ART: 39.4 mmHg (ref 32.0–48.0)
TCO2: 28 mmol/L (ref 0–100)
pH, Arterial: 7.448 (ref 7.350–7.450)
pO2, Arterial: 127 mmHg — ABNORMAL HIGH (ref 83.0–108.0)

## 2016-07-04 SURGERY — RIGHT/LEFT HEART CATH AND CORONARY ANGIOGRAPHY

## 2016-07-04 MED ORDER — FENTANYL CITRATE (PF) 100 MCG/2ML IJ SOLN
INTRAMUSCULAR | Status: DC | PRN
  Administered 2016-07-04: 50 ug via INTRAVENOUS

## 2016-07-04 MED ORDER — MIDAZOLAM HCL 2 MG/2ML IJ SOLN
INTRAMUSCULAR | Status: DC | PRN
  Administered 2016-07-04 (×2): 1 mg via INTRAVENOUS

## 2016-07-04 MED ORDER — SODIUM CHLORIDE 0.9% FLUSH: 3.0000 mL | Freq: Two times a day (BID) | INTRAVENOUS | Status: DC

## 2016-07-04 MED ORDER — VERAPAMIL HCL 2.5 MG/ML IV SOLN
INTRAVENOUS | Status: DC | PRN
  Administered 2016-07-04: 10 mL via INTRA_ARTERIAL

## 2016-07-04 MED ORDER — SODIUM CHLORIDE 0.9 % WEIGHT BASED INFUSION
1.0000 mL/kg/h | INTRAVENOUS | Status: DC
Start: 2016-07-04 — End: 2016-07-04

## 2016-07-04 MED ORDER — OXYCODONE-ACETAMINOPHEN 5-325 MG PO TABS: 1.0000 | ORAL_TABLET | ORAL | Status: DC | PRN

## 2016-07-04 MED ORDER — HEPARIN SODIUM (PORCINE) 1000 UNIT/ML IJ SOLN
INTRAMUSCULAR | Status: DC | PRN
  Administered 2016-07-04: 5000 [IU] via INTRAVENOUS

## 2016-07-04 MED ORDER — FENTANYL CITRATE (PF) 100 MCG/2ML IJ SOLN
INTRAMUSCULAR | Status: AC
  Filled 2016-07-04: qty 2

## 2016-07-04 MED ORDER — ASPIRIN 81 MG PO CHEW
81.0000 mg | CHEWABLE_TABLET | ORAL | Status: AC
  Administered 2016-07-04: 81 mg via ORAL

## 2016-07-04 MED ORDER — LIDOCAINE HCL (PF) 1 % IJ SOLN
INTRAMUSCULAR | Status: AC
  Filled 2016-07-04: qty 30

## 2016-07-04 MED ORDER — IOPAMIDOL (ISOVUE-370) INJECTION 76%
INTRAVENOUS | Status: DC | PRN
  Administered 2016-07-04: 90 mL via INTRAVENOUS

## 2016-07-04 MED ORDER — ASPIRIN 81 MG PO CHEW
CHEWABLE_TABLET | ORAL | Status: AC
  Administered 2016-07-04: 81 mg via ORAL
  Filled 2016-07-04: qty 1

## 2016-07-04 MED ORDER — ACETAMINOPHEN 325 MG PO TABS: 650.0000 mg | ORAL_TABLET | ORAL | Status: DC | PRN

## 2016-07-04 MED ORDER — HEPARIN SODIUM (PORCINE) 1000 UNIT/ML IJ SOLN
INTRAMUSCULAR | Status: AC
  Filled 2016-07-04: qty 1

## 2016-07-04 MED ORDER — SODIUM CHLORIDE 0.9 % IV SOLN: 250.0000 mL | INTRAVENOUS | Status: DC | PRN

## 2016-07-04 MED ORDER — MIDAZOLAM HCL 2 MG/2ML IJ SOLN
INTRAMUSCULAR | Status: AC
Start: 2016-07-04 — End: ?
  Filled 2016-07-04: qty 2

## 2016-07-04 MED ORDER — SODIUM CHLORIDE 0.9% FLUSH: 3.0000 mL | INTRAVENOUS | Status: DC | PRN

## 2016-07-04 MED ORDER — LIDOCAINE HCL (PF) 1 % IJ SOLN
INTRAMUSCULAR | Status: DC | PRN
  Administered 2016-07-04 (×2): 2 mL

## 2016-07-04 MED ORDER — HEPARIN (PORCINE) IN NACL 2-0.9 UNIT/ML-% IJ SOLN
INTRAMUSCULAR | Status: AC
  Filled 2016-07-04: qty 1000

## 2016-07-04 MED ORDER — SODIUM CHLORIDE 0.9 % WEIGHT BASED INFUSION
3.0000 mL/kg/h | INTRAVENOUS | Status: AC
  Administered 2016-07-04: 3 mL/kg/h via INTRAVENOUS

## 2016-07-04 MED ORDER — HEPARIN (PORCINE) IN NACL 2-0.9 UNIT/ML-% IJ SOLN
INTRAMUSCULAR | Status: DC | PRN
  Administered 2016-07-04: 1000 mL

## 2016-07-04 MED ORDER — ONDANSETRON HCL 4 MG/2ML IJ SOLN: 4.0000 mg | Freq: Four times a day (QID) | INTRAMUSCULAR | Status: DC | PRN

## 2016-07-04 MED ORDER — SODIUM CHLORIDE 0.9 % IV SOLN: INTRAVENOUS | Status: DC

## 2016-07-04 MED ORDER — IOPAMIDOL (ISOVUE-370) INJECTION 76%
INTRAVENOUS | Status: AC
  Filled 2016-07-04: qty 100

## 2016-07-04 MED ORDER — VERAPAMIL HCL 2.5 MG/ML IV SOLN
INTRAVENOUS | Status: AC
  Filled 2016-07-04: qty 2

## 2016-07-04 SURGICAL SUPPLY — 15 items
CATH BALLN WEDGE 5F 110CM (CATHETERS) ×1 IMPLANT
CATH EXPO 5F FL3.5 (CATHETERS) ×1 IMPLANT
CATH INFINITI JR4 5F (CATHETERS) ×1 IMPLANT
COVER PRB 48X5XTLSCP FOLD TPE (BAG) IMPLANT
COVER PROBE 5X48 (BAG) ×2
DEVICE RAD COMP TR BAND LRG (VASCULAR PRODUCTS) ×1 IMPLANT
GLIDESHEATH SLEND A-KIT 6F 22G (SHEATH) ×1 IMPLANT
GUIDEWIRE .025 260CM (WIRE) ×1 IMPLANT
GUIDEWIRE INQWIRE 1.5J.035X260 (WIRE) IMPLANT
INQWIRE 1.5J .035X260CM (WIRE) ×2
KIT HEART LEFT (KITS) ×2 IMPLANT
PACK CARDIAC CATHETERIZATION (CUSTOM PROCEDURE TRAY) ×2 IMPLANT
SHEATH FAST CATH BRACH 5F 5CM (SHEATH) ×1 IMPLANT
TRANSDUCER W/STOPCOCK (MISCELLANEOUS) ×2 IMPLANT
TUBING CIL FLEX 10 FLL-RA (TUBING) ×2 IMPLANT

## 2016-07-04 NOTE — Discharge Instructions (Signed)
Radial Site Care °Refer to this sheet in the next few weeks. These instructions provide you with information about caring for yourself after your procedure. Your health care provider may also give you more specific instructions. Your treatment has been planned according to current medical practices, but problems sometimes occur. Call your health care provider if you have any problems or questions after your procedure. °What can I expect after the procedure? °After your procedure, it is typical to have the following: °· Bruising at the radial site that usually fades within 1-2 weeks. °· Blood collecting in the tissue (hematoma) that may be painful to the touch. It should usually decrease in size and tenderness within 1-2 weeks. °Follow these instructions at home: °· Take medicines only as directed by your health care provider. °· You may shower 24-48 hours after the procedure or as directed by your health care provider. Remove the bandage (dressing) and gently wash the site with plain soap and water. Pat the area dry with a clean towel. Do not rub the site, because this may cause bleeding. °· Do not take baths, swim, or use a hot tub until your health care provider approves. °· Check your insertion site every day for redness, swelling, or drainage. °· Do not apply powder or lotion to the site. °· Do not flex or bend the affected arm for 24 hours or as directed by your health care provider. °· Do not push or pull heavy objects with the affected arm for 24 hours or as directed by your health care provider. °· Do not lift over 10 lb (4.5 kg) for 5 days after your procedure or as directed by your health care provider. °· Ask your health care provider when it is okay to: °¨ Return to work or school. °¨ Resume usual physical activities or sports. °¨ Resume sexual activity. °· Do not drive home if you are discharged the same day as the procedure. Have someone else drive you. °· You may drive 24 hours after the procedure  unless otherwise instructed by your health care provider. °· Do not operate machinery or power tools for 24 hours after the procedure. °· If your procedure was done as an outpatient procedure, which means that you went home the same day as your procedure, a responsible adult should be with you for the first 24 hours after you arrive home. °· Keep all follow-up visits as directed by your health care provider. This is important. °Contact a health care provider if: °· You have a fever. °· You have chills. °· You have increased bleeding from the radial site. Hold pressure on the site. °Get help right away if: °· You have unusual pain at the radial site. °· You have redness, warmth, or swelling at the radial site. °· You have drainage (other than a small amount of blood on the dressing) from the radial site. °· The radial site is bleeding, and the bleeding does not stop after 30 minutes of holding steady pressure on the site. °· Your arm or hand becomes pale, cool, tingly, or numb. °This information is not intended to replace advice given to you by your health care provider. Make sure you discuss any questions you have with your health care provider. °Document Released: 05/17/2010 Document Revised: 09/20/2015 Document Reviewed: 10/31/2013 °Elsevier Interactive Patient Education © 2017 [employer] Inc[employer] ° °

## 2016-07-04 NOTE — Interval H&P Note (Signed)
Cath Lab Visit (complete for each Cath Lab visit)  Clinical Evaluation Leading to the Procedure:   ACS: No.  Non-ACS:    Anginal Classification: CCS Ortiz  Anti-ischemic medical therapy: Minimal Therapy (1 class of medications)  Non-Invasive Test Results: No non-invasive testing performed  Prior CABG: No previous CABG      History and Physical Interval Note:  07/04/2016 7:13 AM  Wayne Ortiz  has presented today for surgery, with the diagnosis of shortness of breath  The various methods of treatment have been discussed with the patient and family. After consideration of risks, benefits and other options for treatment, the patient has consented to  Procedure(s): Right/Left Heart Cath and Coronary Angiography ([name]) as a surgical intervention .  The patient's history has been reviewed, patient examined, no change in status, stable for surgery.  I have reviewed the patient's chart and labs.  Questions were answered to the patient's satisfaction.     Wayne Ortiz

## 2016-07-04 NOTE — H&P (View-Only) (Signed)
Electrophysiology Office Note   Date:  06/30/2016   ID:  Wayne Ortiz, DOB August 14, 1945, MRN [id number]  PCP:  [name], MD  Cardiologist:  Dr Harl Bowie Primary Electrophysiologist: [name], MD    Chief Complaint  Patient presents with  . Atrial Fibrillation     History of Present Illness: Wayne Ortiz is a 71 y.o. male who presents today for electrophysiology evaluation.   He has h/o CAD- recent DES to LCX and DES x2 to RCA at [facility] 05/2014 and persistent afib.  He previously failed medical therapy with amiodarone and sotalol.  He underwent afib ablation by me 11/16 and again 12/17.  He continues to have SOB and decreased exercise tolerance.  He is very concerned with this.  Today, he denies symptoms of palpitations, chest pain, orthopnea, PND, lower extremity edema, claudication, dizziness, presyncope, syncope, bleeding, or neurologic sequela. The patient is tolerating medications without difficulties and is otherwise without complaint today.    Past Medical History:  Diagnosis Date  . Arthritis   . Atrial fibrillation ([place])   . BPH (benign prostatic hypertrophy)   . CAD (coronary artery disease)    a. s/p DES 05/2014 at [facility]  . Complication of anesthesia    BECAME COMBATIVE  . Dysrhythmia   . Femur fracture ([place])   . GERD (gastroesophageal reflux disease)    occasional TUMS  . Gout   . History of gout   . History of kidney stones   . Hypertension   . Hypothyroidism   . Persistent atrial fibrillation ([place])   . Precancerous skin lesion    Past Surgical History:  Procedure Laterality Date  . arm fracture surgery Left   . CARDIOVERSION [name] 12/09/2014   Procedure: CARDIOVERSION;  Surgeon: [name], MD;  Location[facility];  Service: Cardiovascular;  Laterality: [name];  . CARDIOVERSION [name] 05/05/2016   Procedure: CARDIOVERSION;  Surgeon: [name], MD;  Location: [place];  Service: Cardiovascular;  Laterality: [name];  . CATARACTS     EXCISION  . CERVICAL SPINE  SURGERY    . CHOLECYSTECTOMY [name] 01/03/2013   Procedure: LAPAROSCOPIC CHOLECYSTECTOMY;  Surgeon: [name], MD;  Location: [name];  Service: General;  Laterality: [name];  . ELECTROPHYSIOLOGIC STUDY [name] 03/15/2015   Procedure: Atrial Fibrillation Ablation;  Surgeon: [name], MD;  Location: [place] CV LAB;  Service: Cardiovascular;  Laterality: [name];  . ELECTROPHYSIOLOGIC STUDY [name] 04/01/2016   Procedure: Atrial Fibrillation Ablation;  Surgeon: [name], MD;  Location: [place] CV LAB;  Service: Cardiovascular;  Laterality: [name];  . ERCP [name] 01/02/2013   Procedure: ENDOSCOPIC RETROGRADE CHOLANGIOPANCREATOGRAPHY (ERCP);  Surgeon: [name], MD;  Location: [name];  Service: Endoscopy;  Laterality: [name];  . FEMUR FRACTURE SURGERY    . LIVER BIOPSY [name] 01/03/2013   Procedure: LIVER BIOPSY;  Surgeon: [name], MD;  Location: [name];  Service: General;  Laterality: [name];  . SPHINCTEROTOMY [name] 01/02/2013   Procedure: Joan Mayans;  Surgeon: [name], MD;  Location: [name];  Service: Endoscopy;  Laterality: [name];  Stone Extraction  . TOTAL KNEE ARTHROPLASTY Right 05/30/2013   Procedure: RIGHT TOTAL KNEE ARTHROPLASTY;  Surgeon: [name], MD;  Location: [name];  Service: Orthopedics;  Laterality: Right;     Current Outpatient Prescriptions  Medication Sig Dispense Refill  . allopurinol (ZYLOPRIM) 300 MG tablet Take 300 mg by mouth every morning.     [name] apixaban (ELIQUIS) 5 MG TABS tablet Take 1  tablet (5 mg total) by mouth 2 (two) times daily. 60 tablet 11  . diltiazem (CARDIZEM CD) 120 MG 24 hr capsule Take 2 capsules (240 mg total) by mouth 2 (two) times daily. 360 capsule 3  . furosemide (LASIX) 40 MG tablet Take 1 tablet (40 mg total) by mouth daily. 90 tablet 3  . HYDROcodone-acetaminophen (NORCO/VICODIN) 5-325 MG tablet Take 1 tablet by mouth every 6 (six) hours as needed for moderate pain.    [name] ibuprofen (ADVIL,MOTRIN) 800 MG tablet Take 800 mg by mouth every 8  (eight) hours as needed for moderate pain.     . pantoprazole (PROTONIX) 40 MG tablet TAKE 1 TABLET(40 MG) BY MOUTH DAILY 90 tablet 2  . sodium chloride (OCEAN) 0.65 % SOLN nasal spray Place 2 sprays into both nostrils as needed for congestion (Use as directed).     . SYNTHROID 150 MCG tablet Take 150 mcg by mouth daily.   0  . tamsulosin (FLOMAX) 0.4 MG CAPS capsule Take 0.4 mg by mouth daily.    . nitroGLYCERIN (NITROSTAT) 0.4 MG SL tablet Place 0.4 mg under the tongue every 5 (five) minutes x 3 doses as needed for chest pain.      No current facility-administered medications for this visit.     Allergies:   Patient has no known allergies.   Social History:  The patient  reports that he quit smoking about 43 years ago. He has never used smokeless tobacco. He reports that he drinks alcohol. He reports that he does not use drugs.   Family History:  The patient's  family history includes Kidney Stones in his mother.    ROS:  Please see the history of present illness.   All other systems are reviewed and negative.    PHYSICAL EXAM: VS:  BP 140/82   Pulse 84   Ht 5\' 11"  (1.803 m)   Wt 260 lb (117.9 kg)   SpO2 93%   BMI 36.26 kg/m  , BMI Body mass index is 36.26 kg/m. GEN: overweight, in no acute distress  HEENT: normal  Neck: no JVD, carotid bruits, or masses Cardiac: irregular rhythm no murmurs, rubs, or gallops,no edema  Respiratory:  clear to auscultation bilaterally, normal work of breathing GI: soft, nontender, nondistended, + BS MS: no deformity or atrophy  Skin: warm and dry  Neuro:  Strength and sensation are intact Psych: euthymic mood, full affect  EKG:  EKG is ordered today. The ekg ordered today shows afib,  V rate 84 bpm, septal infarct  Recent Labs: 03/18/2016: TSH 0.856 05/02/2016: BUN 13; Creatinine, Ser 1.32; Hemoglobin 17.1; Platelets 147; Potassium 3.9; Sodium 141    Lipid Panel     Component Value Date/Time   CHOL 164 08/08/2014 0711   TRIG 104  08/08/2014 0711   HDL 41 08/08/2014 0711   CHOLHDL 4.0 08/08/2014 0711   VLDL 21 08/08/2014 0711   LDLCALC 102 (H) 08/08/2014 0711     Wt Readings from Last 3 Encounters:  06/30/16 260 lb (117.9 kg)  05/12/16 265 lb 6.4 oz (120.4 kg)  05/02/16 263 lb 9.6 oz (119.6 kg)      ASSESSMENT AND PLAN:  1.  Persistent atrial fibrillation ERAF post ablation Will evaluate SOB (below) and then consider hospitalization upon return for initiation of tikosyn if still in AF   2. SOB Unclear etiology Possibly due to afib, though he does not think that he was better with sinus rhythm recently Would advise proceed with RHC/LHC to  evaluate SOB.   The patient understands that risks included but are not limited to stroke (1 in 1000), death (1 in 109), kidney failure [usually temporary] (1 in 500), bleeding (1 in 200), allergic reaction [possibly serious] (1 in 200). The patient understands and agrees to proceed.  Will hold eliquis for 48 hours prior to cath  PFTs also ordered to evaluate SOB Regular exercise, weight loss also discussed at length today   3. HTN Stable No change required today  4. Morbid obesity Body mass index is 36.26 kg/m. He has made no effort at lifestyle modification  Current medicines are reviewed at length with the patient today.   The patient does not have concerns regarding his medicines.  The following changes were made today:  None   Return to see me in 3 weeks  Very complicated patient.  At risks for decompensation/ hospitalization.  A high level of decision making is required for this visit  Signed, [name], MD  06/30/2016 10:05 AM     [facility] [place]  [address] [phone] (office) [phone] (fax)

## 2016-07-09 ENCOUNTER — Ambulatory Visit (HOSPITAL_COMMUNITY)
Admission: RE | Admit: 2016-07-09 | Discharge: 2016-07-09 | Disposition: A | Discharge status: Home / Self Care | Payer: Medicare HMO | Source: Ambulatory Visit | Attending: Internal Medicine | Admitting: Internal Medicine

## 2016-07-09 DIAGNOSIS — R0602 Shortness of breath: Secondary | ICD-10-CM | POA: Diagnosis not present

## 2016-07-09 DIAGNOSIS — I481 Persistent atrial fibrillation: Secondary | ICD-10-CM | POA: Insufficient documentation

## 2016-07-09 DIAGNOSIS — I2510 Atherosclerotic heart disease of native coronary artery without angina pectoris: Secondary | ICD-10-CM | POA: Insufficient documentation

## 2016-07-09 DIAGNOSIS — I10 Essential (primary) hypertension: Secondary | ICD-10-CM | POA: Insufficient documentation

## 2016-07-09 LAB — PULMONARY FUNCTION TEST
DL/VA % pred: 86 %
DL/VA: 4.08 ml/min/mmHg/L
DLCO UNC % PRED: 66 %
DLCO UNC: 23.17 ml/min/mmHg
DLCO cor % pred: 66 %
DLCO cor: 23.17 ml/min/mmHg
FEF 25-75 POST: 2.7 L/s
FEF 25-75 PRE: 2.25 L/s
FEF2575-%Change-Post: 19 %
FEF2575-%Pred-Post: 104 %
FEF2575-%Pred-Pre: 87 %
FEV1-%CHANGE-POST: 5 %
FEV1-%Pred-Post: 77 %
FEV1-%Pred-Pre: 73 %
FEV1-POST: 2.69 L
FEV1-PRE: 2.55 L
FEV1FVC-%Change-Post: 1 %
FEV1FVC-%PRED-PRE: 105 %
FEV6-%CHANGE-POST: 4 %
FEV6-%PRED-POST: 77 %
FEV6-%PRED-PRE: 74 %
FEV6-POST: 3.46 L
FEV6-PRE: 3.31 L
FEV6FVC-%CHANGE-POST: 0 %
FEV6FVC-%PRED-POST: 106 %
FEV6FVC-%PRED-PRE: 105 %
FVC-%Change-Post: 3 %
FVC-%Pred-Post: 73 %
FVC-%Pred-Pre: 70 %
FVC-PRE: 3.33 L
FVC-Post: 3.46 L
POST FEV1/FVC RATIO: 78 %
Post FEV6/FVC ratio: 100 %
Pre FEV1/FVC ratio: 77 %
Pre FEV6/FVC Ratio: 100 %
RV % PRED: 114 %
RV: 2.98 L
TLC % PRED: 87 %
TLC: 6.51 L

## 2016-07-09 MED ORDER — ALBUTEROL SULFATE (2.5 MG/3ML) 0.083% IN NEBU
2.5000 mg | INHALATION_SOLUTION | Freq: Once | RESPIRATORY_TRACT | Status: AC
  Administered 2016-07-09: 2.5 mg via RESPIRATORY_TRACT

## 2016-07-15 ENCOUNTER — Telehealth: Payer: Self-pay

## 2016-07-15 NOTE — Telephone Encounter (Signed)
Pt is aware and agreeable to results. I stressed the importance of following up with PCP about Blood sugar and Cholesterol to make sure we get his levels normalized and under control. He expressed to me that he was not happy with his PCP office and wanted to find another doctor. He is aware of his follow up appt next week and wants to discuss medication with Dr. [name]. I informed him that is perfectly fine. He is agreeable

## 2016-07-23 ENCOUNTER — Encounter: Payer: Self-pay | Admitting: Internal Medicine

## 2016-07-23 ENCOUNTER — Ambulatory Visit (INDEPENDENT_AMBULATORY_CARE_PROVIDER_SITE_OTHER): Payer: Medicare HMO | Admitting: Internal Medicine

## 2016-07-23 VITALS — BP 138/78 | HR 79 | Ht 71.0 in | Wt 265.5 lb

## 2016-07-23 DIAGNOSIS — R0602 Shortness of breath: Secondary | ICD-10-CM

## 2016-07-23 DIAGNOSIS — I2510 Atherosclerotic heart disease of native coronary artery without angina pectoris: Secondary | ICD-10-CM | POA: Diagnosis not present

## 2016-07-23 DIAGNOSIS — I481 Persistent atrial fibrillation: Secondary | ICD-10-CM

## 2016-07-23 DIAGNOSIS — I10 Essential (primary) hypertension: Secondary | ICD-10-CM

## 2016-07-23 DIAGNOSIS — Z9861 Coronary angioplasty status: Secondary | ICD-10-CM | POA: Diagnosis not present

## 2016-07-23 DIAGNOSIS — I4819 Other persistent atrial fibrillation: Secondary | ICD-10-CM

## 2016-07-23 MED ORDER — DILTIAZEM HCL ER COATED BEADS 120 MG PO CP24: 120.0000 mg | ORAL_CAPSULE | Freq: Two times a day (BID) | ORAL | 3 refills | Status: DC

## 2016-07-23 NOTE — Patient Instructions (Signed)
Medication Instructions:  DECREASE diltiazem to 120mg  (1 tablet) two times daily.  Labwork: NONE  Testing/Procedures: NONE  Follow-Up: Your physician recommends that you schedule a follow-up appointment in: 3 Months with Roderic Palau NP at [facility] wants you to follow-up in: 6 Months with Dr. Vallery Ridge will receive a reminder letter in the mail two months in advance. If you don't receive a letter, please call our office to schedule the follow-up appointment.     Any Other Special Instructions Will Be Listed Below (If Applicable).     If you need a refill on your cardiac medications before your next appointment, please call your pharmacy.

## 2016-07-23 NOTE — Progress Notes (Signed)
Electrophysiology Office Note   Date:  07/23/2016   ID:  SQUIRE WITHEY, DOB 1945-12-24, MRN [id number]  PCP:  [name], MD  Cardiologist:  Dr Harl Bowie Primary Electrophysiologist: [name], MD    Chief Complaint  Patient presents with  . Atrial Flutter     History of Present Illness: Wayne Ortiz is a 71 y.o. male who presents today for electrophysiology evaluation.   He is s/p recent cath which revealed 70% RCA disease which is to be managed medically.  He converted to sinus about 2 days after his last visit and has maintained sinus since that time.  He feels so much better in sinus.  His SOB is better.  He is currently limited by back pain which we spent a fair bit of time talking about today.  Today, he denies symptoms of palpitations, chest pain, orthopnea, PND, lower extremity edema, claudication, dizziness, presyncope, syncope, bleeding, or neurologic sequela. The patient is tolerating medications without difficulties and is otherwise without complaint today.    Past Medical History:  Diagnosis Date  . Arthritis   . Atrial fibrillation ([place])   . BPH (benign prostatic hypertrophy)   . CAD (coronary artery disease)    a. s/p DES 05/2014 at [facility]  . Complication of anesthesia    BECAME COMBATIVE  . Dysrhythmia   . Femur fracture ([place])   . GERD (gastroesophageal reflux disease)    occasional TUMS  . Gout   . History of gout   . History of kidney stones   . Hypertension   . Hypothyroidism   . Persistent atrial fibrillation ([place])   . Precancerous skin lesion    Past Surgical History:  Procedure Laterality Date  . arm fracture surgery Left   . CARDIOVERSION [name] 12/09/2014   Procedure: CARDIOVERSION;  Surgeon: [name], MD;  Location: [facility] OR;  Service: Cardiovascular;  Laterality: [name];  . CARDIOVERSION [name] 05/05/2016   Procedure: CARDIOVERSION;  Surgeon: [name], MD;  Location: [place];  Service: Cardiovascular;  Laterality: [name];  . CATARACTS     EXCISION  . CERVICAL SPINE SURGERY    . CHOLECYSTECTOMY [name] 01/03/2013   Procedure: LAPAROSCOPIC CHOLECYSTECTOMY;  Surgeon: [name], MD;  Location: [name];  Service: General;  Laterality: [name];  . ELECTROPHYSIOLOGIC STUDY [name] 03/15/2015   Procedure: Atrial Fibrillation Ablation;  Surgeon: [name], MD;  Location: [place] CV LAB;  Service: Cardiovascular;  Laterality: [name];  . ELECTROPHYSIOLOGIC STUDY [name] 04/01/2016   Procedure: Atrial Fibrillation Ablation;  Surgeon: [name], MD;  Location: [place] CV LAB;  Service: Cardiovascular;  Laterality: [name];  . ERCP [name] 01/02/2013   Procedure: ENDOSCOPIC RETROGRADE CHOLANGIOPANCREATOGRAPHY (ERCP);  Surgeon: [name], MD;  Location: [name];  Service: Endoscopy;  Laterality: [name];  . FEMUR FRACTURE SURGERY    . LIVER BIOPSY [name] 01/03/2013   Procedure: LIVER BIOPSY;  Surgeon: [name], MD;  Location: [name];  Service: General;  Laterality: [name];  . RIGHT/LEFT HEART CATH AND CORONARY ANGIOGRAPHY [name] 07/04/2016   Procedure: Right/Left Heart Cath and Coronary Angiography;  Surgeon: Belva Crome, MD;  Location: [place] CV LAB;  Service: Cardiovascular;  Laterality: [name];  . SPHINCTEROTOMY [name] 01/02/2013   Procedure: SPHINCTEROTOMY;  Surgeon: [name], MD;  Location: [name];  Service: Endoscopy;  Laterality: [name];  Stone Extraction  . TOTAL KNEE ARTHROPLASTY Right 05/30/2013   Procedure: RIGHT TOTAL KNEE ARTHROPLASTY;  Surgeon: [name], MD;  Location: [facility];  Service: Orthopedics;  Laterality: Right;     Current Outpatient Prescriptions  Medication Sig Dispense Refill  . allopurinol (ZYLOPRIM) 300 MG tablet Take 300 mg by mouth every morning.     [name] apixaban (ELIQUIS) 5 MG TABS tablet Take 1 tablet (5 mg total) by mouth 2 (two) times daily. 60 tablet 11  . diltiazem (CARDIZEM CD) 120 MG 24 hr capsule Take 2 capsules (240 mg total) by mouth 2 (two) times daily. 360 capsule 3  . furosemide (LASIX) 40 MG tablet Take 1  tablet (40 mg total) by mouth daily. 90 tablet 3  . HYDROcodone-acetaminophen (NORCO/VICODIN) 5-325 MG tablet Take 1 tablet by mouth every 6 (six) hours as needed for moderate pain.    [name] SYNTHROID 150 MCG tablet Take 150 mcg by mouth daily before breakfast.   0  . tamsulosin (FLOMAX) 0.4 MG CAPS capsule Take 0.4 mg by mouth daily.    . nitroGLYCERIN (NITROSTAT) 0.4 MG SL tablet Place 0.4 mg under the tongue every 5 (five) minutes x 3 doses as needed for chest pain.      No current facility-administered medications for this visit.     Allergies:   Patient has no known allergies.   Social History:  The patient  reports that he quit smoking about 43 years ago. He has never used smokeless tobacco. He reports that he drinks alcohol. He reports that he does not use drugs.   Family History:  The patient's  family history includes Kidney Stones in his mother.    ROS:  Please see the history of present illness.   All other systems are reviewed and negative.    PHYSICAL EXAM: VS:  BP 138/78   Pulse 79   Ht 5\' 11"  (1.803 m)   Wt 265 lb 8 oz (120.4 kg)   BMI 37.03 kg/m  , BMI Body mass index is 37.03 kg/m. GEN: overweight, in no acute distress  HEENT: normal  Neck: no JVD, carotid bruits, or masses Cardiac: regular rhythm no murmurs, rubs, or gallops,no edema  Respiratory:  clear to auscultation bilaterally, normal work of breathing GI: soft, nontender, nondistended, + BS MS: no deformity or atrophy  Skin: warm and dry  Neuro:  Strength and sensation are intact Psych: euthymic mood, full affect  EKG:  EKG is ordered today. The ekg ordered today shows sinus rhythm 66 bpm< PR 148 msec, septal infarct  Recent Labs: 05/02/2016: Hemoglobin 17.1 06/30/2016: ALT 15; BUN 16; Creatinine, Ser 1.30; Platelets 166; Potassium 4.2; Sodium 142; TSH 2.320    Lipid Panel     Component Value Date/Time   CHOL 183 06/30/2016 1115   TRIG 122 06/30/2016 1115   HDL 41 06/30/2016 1115   CHOLHDL 4.5  06/30/2016 1115   CHOLHDL 4.0 08/08/2014 0711   VLDL 21 08/08/2014 0711   LDLCALC 118 (H) 06/30/2016 1115     Wt Readings from Last 3 Encounters:  07/23/16 265 lb 8 oz (120.4 kg)  07/04/16 263 lb (119.3 kg)  06/30/16 260 lb (117.9 kg)      ASSESSMENT AND PLAN:  1.  Persistent atrial fibrillation ERAF post ablation Now in sinus and doing well Reduced diltiazem CD to 120mg  BID If further afib, could consider MAZE and CABG by Dr Roxy Manns.   2. SOB Improved with sinus PFTs reviewed with him today  Regular exercise, weight loss also discussed at length today   3. HTN Stable No change required today  4. Morbid obesity Body mass index  is 37.03 kg/m. Again I have encouraged lifestyle modification  5. CAD Cath reviewed and discussed with Dr Tamala Julian Pt declines statin due to prior myalgias.  I have advised psk9 inhibitor which he also declines If further afib, would consider MAZE and CABG by Dr Roxy Manns.  Current medicines are reviewed at length with the patient today.   The patient does not have concerns regarding his medicines.  The following changes were made today:  None   Follow-up with Butch Penny in AF clinic in 3 months Return to see me in 6 months  Very complicated patient.  At risks for decompensation/ hospitalization.  A high level of decision making is required for this visit  Signed, [name], MD  07/23/2016 12:06 PM     [address] [place] [address] [phone] (office) [phone] (fax)

## 2016-07-30 ENCOUNTER — Inpatient Hospital Stay (HOSPITAL_COMMUNITY): Admission: RE | Admit: 2016-07-30 | Payer: Medicare HMO | Source: Ambulatory Visit | Admitting: Nurse Practitioner

## 2016-07-31 DIAGNOSIS — Z87442 Personal history of urinary calculi: Secondary | ICD-10-CM | POA: Diagnosis not present

## 2016-07-31 DIAGNOSIS — R310 Gross hematuria: Secondary | ICD-10-CM | POA: Diagnosis not present

## 2016-08-02 ENCOUNTER — Other Ambulatory Visit (HOSPITAL_COMMUNITY): Payer: Self-pay | Admitting: Nurse Practitioner

## 2016-08-05 ENCOUNTER — Telehealth (HOSPITAL_COMMUNITY): Payer: Self-pay | Admitting: *Deleted

## 2016-08-05 NOTE — Telephone Encounter (Signed)
Patient called in stating he was more short of breath today and noticed his HR has been up the last 2 days 90-106 range. Feels fluid overloaded. Discussed with Roderic Palau, NP will increase lasix to 60mg  for 3 days and he will call to report HR/BP/fluid status on Thursday. If HR still elevated at that time will adjust rate control medications. Pt in agreement.

## 2016-08-06 ENCOUNTER — Other Ambulatory Visit (HOSPITAL_COMMUNITY): Payer: Self-pay | Admitting: Nurse Practitioner

## 2016-08-07 NOTE — Telephone Encounter (Signed)
Pt called in today stating feeling much better with increased lasix doses. Will go back to normal dosing. Continue to watch sodium intake.

## 2016-08-11 DIAGNOSIS — M4726 Other spondylosis with radiculopathy, lumbar region: Secondary | ICD-10-CM | POA: Diagnosis not present

## 2016-08-11 DIAGNOSIS — M5416 Radiculopathy, lumbar region: Secondary | ICD-10-CM | POA: Diagnosis not present

## 2016-08-18 ENCOUNTER — Telehealth (HOSPITAL_COMMUNITY): Payer: Self-pay | Admitting: *Deleted

## 2016-08-18 NOTE — Telephone Encounter (Signed)
Patient called in stating his heart has been out of rhythm since Saturday with exertion working in the yard. HR was in the 130s now in the 90s. He decided to take an extra 240mg  of cardizem both Saturday and Sunday. Pt states his legs are very swollen - instructed to take extra lasix tablet this afternoon. Instructed pt he can take 120mg  of cardizem tonight since had already taken 240mg  this morning. Pt will call back on Wednesday -- if still in afib will bring in for office visit.

## 2016-08-20 ENCOUNTER — Telehealth (HOSPITAL_COMMUNITY): Payer: Self-pay | Admitting: *Deleted

## 2016-08-20 ENCOUNTER — Ambulatory Visit (HOSPITAL_COMMUNITY)
Admission: RE | Admit: 2016-08-20 | Discharge: 2016-08-20 | Disposition: A | Discharge status: Home / Self Care | Payer: Medicare HMO | Source: Ambulatory Visit | Attending: Nurse Practitioner | Admitting: Nurse Practitioner

## 2016-08-20 ENCOUNTER — Encounter (HOSPITAL_COMMUNITY): Payer: Self-pay | Admitting: Nurse Practitioner

## 2016-08-20 ENCOUNTER — Other Ambulatory Visit (HOSPITAL_COMMUNITY): Payer: Self-pay | Admitting: *Deleted

## 2016-08-20 VITALS — BP 110/86 | HR 126 | Ht 71.0 in | Wt 254.6 lb

## 2016-08-20 DIAGNOSIS — K219 Gastro-esophageal reflux disease without esophagitis: Secondary | ICD-10-CM | POA: Diagnosis not present

## 2016-08-20 DIAGNOSIS — I2510 Atherosclerotic heart disease of native coronary artery without angina pectoris: Secondary | ICD-10-CM | POA: Diagnosis not present

## 2016-08-20 DIAGNOSIS — E039 Hypothyroidism, unspecified: Secondary | ICD-10-CM | POA: Insufficient documentation

## 2016-08-20 DIAGNOSIS — M109 Gout, unspecified: Secondary | ICD-10-CM | POA: Diagnosis not present

## 2016-08-20 DIAGNOSIS — I10 Essential (primary) hypertension: Secondary | ICD-10-CM | POA: Diagnosis not present

## 2016-08-20 DIAGNOSIS — L989 Disorder of the skin and subcutaneous tissue, unspecified: Secondary | ICD-10-CM | POA: Insufficient documentation

## 2016-08-20 DIAGNOSIS — Z6835 Body mass index (BMI) 35.0-35.9, adult: Secondary | ICD-10-CM | POA: Insufficient documentation

## 2016-08-20 DIAGNOSIS — I4819 Other persistent atrial fibrillation: Secondary | ICD-10-CM

## 2016-08-20 DIAGNOSIS — Z87442 Personal history of urinary calculi: Secondary | ICD-10-CM | POA: Diagnosis not present

## 2016-08-20 DIAGNOSIS — I481 Persistent atrial fibrillation: Secondary | ICD-10-CM | POA: Diagnosis not present

## 2016-08-20 DIAGNOSIS — Z87891 Personal history of nicotine dependence: Secondary | ICD-10-CM | POA: Diagnosis not present

## 2016-08-20 DIAGNOSIS — I482 Chronic atrial fibrillation, unspecified: Secondary | ICD-10-CM

## 2016-08-20 DIAGNOSIS — N400 Enlarged prostate without lower urinary tract symptoms: Secondary | ICD-10-CM | POA: Diagnosis not present

## 2016-08-20 DIAGNOSIS — Z9889 Other specified postprocedural states: Secondary | ICD-10-CM | POA: Diagnosis not present

## 2016-08-20 DIAGNOSIS — Z9049 Acquired absence of other specified parts of digestive tract: Secondary | ICD-10-CM | POA: Insufficient documentation

## 2016-08-20 DIAGNOSIS — Z96651 Presence of right artificial knee joint: Secondary | ICD-10-CM | POA: Diagnosis not present

## 2016-08-20 DIAGNOSIS — Z7901 Long term (current) use of anticoagulants: Secondary | ICD-10-CM | POA: Insufficient documentation

## 2016-08-20 DIAGNOSIS — M545 Low back pain: Secondary | ICD-10-CM | POA: Diagnosis not present

## 2016-08-20 LAB — BASIC METABOLIC PANEL
Anion gap: 8 (ref 5–15)
BUN: 23 mg/dL — AB (ref 6–20)
CHLORIDE: 103 mmol/L (ref 101–111)
CO2: 24 mmol/L (ref 22–32)
CREATININE: 1.11 mg/dL (ref 0.61–1.24)
Calcium: 8.7 mg/dL — ABNORMAL LOW (ref 8.9–10.3)
GFR calc Af Amer: >60 mL/min (ref >60)
GFR calc non Af Amer: >60 mL/min (ref >60)
GLUCOSE: 70 mg/dL (ref 65–99)
Potassium: 4.2 mmol/L (ref 3.5–5.1)
SODIUM: 135 mmol/L (ref 135–145)

## 2016-08-20 LAB — CBC
HEMATOCRIT: 50.7 % (ref 39.0–52.0)
HEMOGLOBIN: 17.4 g/dL — AB (ref 13.0–17.0)
MCH: 32.8 pg (ref 26.0–34.0)
MCHC: 34.3 g/dL (ref 30.0–36.0)
MCV: 95.7 fL (ref 78.0–100.0)
Platelets: 160 10*3/uL (ref 150–400)
RBC: 5.3 MIL/uL (ref 4.22–5.81)
RDW: 13.9 % (ref 11.5–15.5)
WBC: 11 10*3/uL — ABNORMAL HIGH (ref 4.0–10.5)

## 2016-08-20 NOTE — Telephone Encounter (Signed)
Pt notified of TEE/DCCV scheduled for 4/27 with Dr. [name]. NPO after MN to arrive at 830am take meds with sip of water. Follow up appointment made for 5/3. Pt verbalized understanding.

## 2016-08-20 NOTE — Progress Notes (Signed)
Patient ID: Wayne Ortiz, male   DOB: January 31, 1946, 71 y.o.   MRN: [id number]    Primary Care Physician: [name], MD Referring Physician: [name]   Wayne Ortiz is a 71 y.o. male with a h/o afib . He had an ablation in 2016,  failed amiodarone with recent issues with recurrent afib. He was evaluated by Dr. [name] 02/08/16 and it was felt that he would benefit from repeat ablation which he had 04/01/16. On f/u he was in afib and had been in since the week before. He was set up for a cardioversion 05/05/16, which was successful and on f/u today, he was staying in rhythm. He had a cardiac cath for persistent shortness of breath and it did show an area of the RCA that is 70% stenosed which is to be managed medically. Statins were recommended but when he saw Dr. [name], he said that he could not take statins due ot myalgias. On questioning pt he only tried one in the past and was encouraged to discuss with his new PCP which which he establishes with tomorrow to explore this again.  He had been staying in [place] but had a back steroid injection, 4/16 and went back into afib on Friday which has persisted. Increased his cardizem to 240 am and 120 pm and still with rvr He also had to increase lasix to 40 mg bid.Marland KitchenHe is in the clinic today saying that he does not feel well in afib and wishes to be cardioverted. I explored the maze/bypass option that Dr. [name] had discussed in his note, but he states there is no way he is ready for that. He did stop apixaban for the back injection x 3 days and restarted 4/17.  This could have been the trigger.Given option to wait 3 weeks or go ahead with a TEE, he would prefer the latter.  Today, he denies symptoms of palpitations, chest pain, shortness of breath, orthopnea, PND, lower extremity edema, dizziness, presyncope, syncope, or neurologic sequela. The patient is tolerating medications without difficulties and is otherwise without complaint today.   Past Medical History:    Diagnosis Date  . Arthritis   . Atrial fibrillation ([place])   . BPH (benign prostatic hypertrophy)   . CAD (coronary artery disease)    a. s/p DES 05/2014 at [facility]  . Complication of anesthesia    BECAME COMBATIVE  . Dysrhythmia   . Femur fracture ([place])   . GERD (gastroesophageal reflux disease)    occasional TUMS  . Gout   . History of gout   . History of kidney stones   . Hypertension   . Hypothyroidism   . Persistent atrial fibrillation ([place])   . Precancerous skin lesion    Past Surgical History:  Procedure Laterality Date  . arm fracture surgery Left   . CARDIOVERSION [name] 12/09/2014   Procedure: CARDIOVERSION;  Surgeon: [name], MD;  Location: [facility] OR;  Service: Cardiovascular;  Laterality: [name];  . CARDIOVERSION [name] 05/05/2016   Procedure: CARDIOVERSION;  Surgeon: [name], MD;  Location: [place];  Service: Cardiovascular;  Laterality: [name];  . CATARACTS     EXCISION  . CERVICAL SPINE SURGERY    . CHOLECYSTECTOMY [name] 01/03/2013   Procedure: LAPAROSCOPIC CHOLECYSTECTOMY;  Surgeon: [name], MD;  Location: [name];  Service: General;  Laterality: [name];  . ELECTROPHYSIOLOGIC STUDY [name] 03/15/2015   Procedure: Atrial Fibrillation Ablation;  Surgeon: [name], MD;  Location: [place] CV LAB;  Service:  Cardiovascular;  Laterality: [name];  . ELECTROPHYSIOLOGIC STUDY [name] 04/01/2016   Procedure: Atrial Fibrillation Ablation;  Surgeon: [name], MD;  Location: [place] CV LAB;  Service: Cardiovascular;  Laterality: [name];  . ERCP [name] 01/02/2013   Procedure: ENDOSCOPIC RETROGRADE CHOLANGIOPANCREATOGRAPHY (ERCP);  Surgeon: [name], MD;  Location: [name];  Service: Endoscopy;  Laterality: [name];  . FEMUR FRACTURE SURGERY    . LIVER BIOPSY [name] 01/03/2013   Procedure: LIVER BIOPSY;  Surgeon: [name], MD;  Location: [name];  Service: General;  Laterality: [name];  . RIGHT/LEFT HEART CATH AND CORONARY ANGIOGRAPHY [name] 07/04/2016   Procedure: Right/Left Heart Cath  and Coronary Angiography;  Surgeon: Belva Crome, MD;  Location: [place] CV LAB;  Service: Cardiovascular;  Laterality: [name];  . SPHINCTEROTOMY [name] 01/02/2013   Procedure: SPHINCTEROTOMY;  Surgeon: [name], MD;  Location: [name];  Service: Endoscopy;  Laterality: [name];  Stone Extraction  . TOTAL KNEE ARTHROPLASTY Right 05/30/2013   Procedure: RIGHT TOTAL KNEE ARTHROPLASTY;  Surgeon: [name], MD;  Location: [name];  Service: Orthopedics;  Laterality: Right;    Current Outpatient Prescriptions  Medication Sig Dispense Refill  . allopurinol (ZYLOPRIM) 300 MG tablet Take 300 mg by mouth every morning.     [name] apixaban (ELIQUIS) 5 MG TABS tablet Take 1 tablet (5 mg total) by mouth 2 (two) times daily. 60 tablet 11  . diltiazem (CARDIZEM CD) 120 MG 24 hr capsule Take 1 capsule (120 mg total) by mouth 2 (two) times daily. 180 capsule 3  . furosemide (LASIX) 40 MG tablet Take 1 tablet (40 mg total) by mouth daily. 90 tablet 3  . HYDROcodone-acetaminophen (NORCO/VICODIN) 5-325 MG tablet Take 1 tablet by mouth every 6 (six) hours as needed for moderate pain.    [name] SYNTHROID 150 MCG tablet Take 150 mcg by mouth daily before breakfast.   0  . tamsulosin (FLOMAX) 0.4 MG CAPS capsule Take 0.4 mg by mouth daily.    . nitroGLYCERIN (NITROSTAT) 0.4 MG SL tablet Place 0.4 mg under the tongue every 5 (five) minutes x 3 doses as needed for chest pain.      No current facility-administered medications for this encounter.     No Known Allergies  Social History   Social History  . Marital status: Widowed    Spouse name: [name]  . Number of children: [name]  . Years of education: [name]   Occupational History  . Not on file.   Social History Main Topics  . Smoking status: Former Smoker    Quit date: 05/23/1973  . Smokeless tobacco: Never Used  . Alcohol use 0.0 oz/week     Comment: RARE  . Drug use: No  . Sexual activity: Not on file   Other Topics Concern  . Not on file   Social History  Narrative  . No narrative on file    Family History  Problem Relation Age of Onset  . Kidney Stones Mother     ROS- All systems are reviewed and negative except as per the HPI above  Physical Exam: Vitals:   08/20/16 0937  BP: 110/86  Pulse: (!) 126  Weight: 254 lb 9.6 oz (115.5 kg)  Height: 5\' 11"  (1.803 m)    GEN- The patient is well appearing, alert and oriented x 3 today.   Head- normocephalic, atraumatic Eyes-  Sclera clear, conjunctiva pink Ears- hearing intact Oropharynx- clear Neck- supple, no JVP Lymph- no cervical lymphadenopathy Lungs- Clear to  ausculation bilaterally, normal work of breathing Heart- regular rate and rhythm, no murmurs, rubs or gallops, PMI not laterally displaced GI- soft, NT, ND, + BS Extremities- no clubbing, cyanosis, or edema MS- no significant deformity or atrophy Skin- no rash or lesion Psych- euthymic mood, full affect Neuro- strength and sensation are intact  EKG - SR at 64 bpm, pr int 188 ms, qrs int 92 ms, qtc 414 ms Epic records reviewed Dr. Jackalyn Lombard noted reviewed   Assessment and Plan:    1. Persistent symptomatic afib s/p ablation 04/01/16 Successful cardioversion 05/05/16 Unfortunately has returned to afib, possible trigger was steroid back injection 4/16 Will plan on TEE guided cardioversion this Friday 4/27 Continue diltazem Continue eliquis without missed doses  2. Acute diastolic dysfunction stable Continue  daily lasix at 40 mg bid  Weight is actually down from 265 lbs on last office visit in March to 254 lbs  3. HTN Stable  4. Morbid obesity Weight loss  strongly advised  5. CAD No chest pain Discussed possible future bypass/maze He is ready at this time to consider Really needs statin therapy and can discuss further with new PCP, Dr. Merlyn Albert when he establishes tomorrow   F/u afib clinic one week s/p cardioversion  Butch Penny C. Azuri Bozard, [facility] [address] [place], [place] [address] [phone]

## 2016-08-21 DIAGNOSIS — I482 Chronic atrial fibrillation: Secondary | ICD-10-CM | POA: Diagnosis not present

## 2016-08-21 DIAGNOSIS — M1000 Idiopathic gout, unspecified site: Secondary | ICD-10-CM | POA: Diagnosis not present

## 2016-08-21 DIAGNOSIS — E039 Hypothyroidism, unspecified: Secondary | ICD-10-CM | POA: Diagnosis not present

## 2016-08-22 ENCOUNTER — Encounter (HOSPITAL_COMMUNITY): Payer: Self-pay | Admitting: *Deleted

## 2016-08-22 ENCOUNTER — Encounter (HOSPITAL_COMMUNITY)
Admission: RE | Disposition: A | Discharge status: Home / Self Care | Payer: Self-pay | Source: Ambulatory Visit | Attending: Cardiology

## 2016-08-22 ENCOUNTER — Ambulatory Visit (HOSPITAL_COMMUNITY): Payer: Medicare HMO | Admitting: Anesthesiology

## 2016-08-22 ENCOUNTER — Ambulatory Visit (HOSPITAL_BASED_OUTPATIENT_CLINIC_OR_DEPARTMENT_OTHER): Payer: Medicare HMO

## 2016-08-22 ENCOUNTER — Ambulatory Visit (HOSPITAL_COMMUNITY)
Admission: RE | Admit: 2016-08-22 | Discharge: 2016-08-22 | Disposition: A | Discharge status: Home / Self Care | Payer: Medicare HMO | Source: Ambulatory Visit | Attending: Cardiology | Admitting: Cardiology

## 2016-08-22 DIAGNOSIS — K219 Gastro-esophageal reflux disease without esophagitis: Secondary | ICD-10-CM | POA: Insufficient documentation

## 2016-08-22 DIAGNOSIS — I2510 Atherosclerotic heart disease of native coronary artery without angina pectoris: Secondary | ICD-10-CM | POA: Diagnosis not present

## 2016-08-22 DIAGNOSIS — I4891 Unspecified atrial fibrillation: Secondary | ICD-10-CM

## 2016-08-22 DIAGNOSIS — Z7901 Long term (current) use of anticoagulants: Secondary | ICD-10-CM | POA: Insufficient documentation

## 2016-08-22 DIAGNOSIS — Z6834 Body mass index (BMI) 34.0-34.9, adult: Secondary | ICD-10-CM | POA: Insufficient documentation

## 2016-08-22 DIAGNOSIS — M109 Gout, unspecified: Secondary | ICD-10-CM | POA: Insufficient documentation

## 2016-08-22 DIAGNOSIS — I340 Nonrheumatic mitral (valve) insufficiency: Secondary | ICD-10-CM | POA: Diagnosis not present

## 2016-08-22 DIAGNOSIS — E039 Hypothyroidism, unspecified: Secondary | ICD-10-CM | POA: Insufficient documentation

## 2016-08-22 DIAGNOSIS — I10 Essential (primary) hypertension: Secondary | ICD-10-CM | POA: Diagnosis not present

## 2016-08-22 DIAGNOSIS — I482 Chronic atrial fibrillation, unspecified: Secondary | ICD-10-CM

## 2016-08-22 DIAGNOSIS — M1990 Unspecified osteoarthritis, unspecified site: Secondary | ICD-10-CM | POA: Insufficient documentation

## 2016-08-22 DIAGNOSIS — N400 Enlarged prostate without lower urinary tract symptoms: Secondary | ICD-10-CM | POA: Diagnosis not present

## 2016-08-22 DIAGNOSIS — R002 Palpitations: Secondary | ICD-10-CM | POA: Diagnosis not present

## 2016-08-22 DIAGNOSIS — Z955 Presence of coronary angioplasty implant and graft: Secondary | ICD-10-CM | POA: Insufficient documentation

## 2016-08-22 DIAGNOSIS — I481 Persistent atrial fibrillation: Secondary | ICD-10-CM | POA: Insufficient documentation

## 2016-08-22 DIAGNOSIS — Z87891 Personal history of nicotine dependence: Secondary | ICD-10-CM | POA: Insufficient documentation

## 2016-08-22 HISTORY — PX: CARDIOVERSION: SHX1299

## 2016-08-22 HISTORY — PX: TEE WITHOUT CARDIOVERSION: SHX5443

## 2016-08-22 SURGERY — ECHOCARDIOGRAM, TRANSESOPHAGEAL
Anesthesia: General

## 2016-08-22 MED ORDER — PROPOFOL 10 MG/ML IV BOLUS
INTRAVENOUS | Status: DC | PRN
Start: 2016-08-22 — End: 2016-08-22
  Administered 2016-08-22 (×2): 20 mg via INTRAVENOUS

## 2016-08-22 MED ORDER — LACTATED RINGERS IV SOLN
INTRAVENOUS | Status: DC
  Administered 2016-08-22: 1000 mL via INTRAVENOUS
  Administered 2016-08-22: 10:00:00 via INTRAVENOUS

## 2016-08-22 MED ORDER — SODIUM CHLORIDE 0.9 % IV SOLN: INTRAVENOUS | Status: DC

## 2016-08-22 MED ORDER — BUTAMBEN-TETRACAINE-BENZOCAINE 2-2-14 % EX AERO
INHALATION_SPRAY | CUTANEOUS | Status: DC | PRN
  Administered 2016-08-22: 2 via TOPICAL

## 2016-08-22 MED ORDER — PROPOFOL 500 MG/50ML IV EMUL
INTRAVENOUS | Status: DC | PRN
  Administered 2016-08-22: 75 ug/kg/min via INTRAVENOUS

## 2016-08-22 NOTE — Anesthesia Postprocedure Evaluation (Signed)
Anesthesia Post Note  Patient: Wayne Ortiz  Procedure(s) Performed: Procedure(s) (LRB): TRANSESOPHAGEAL ECHOCARDIOGRAM (TEE) ([name]) CARDIOVERSION ([name])  Patient location during evaluation: PACU Anesthesia Type: General Level of consciousness: awake and alert Pain management: pain level controlled Vital Signs Assessment: post-procedure vital signs reviewed and stable Respiratory status: spontaneous breathing, nonlabored ventilation and respiratory function stable Cardiovascular status: blood pressure returned to baseline and stable Postop Assessment: no signs of nausea or vomiting Anesthetic complications: no       Last Vitals:  Vitals:   08/22/16 1040 08/22/16 1050  BP: 94/71 134/74  Pulse: 68 70  Resp: 15 18  Temp:      Last Pain:  Vitals:   08/22/16 1032  TempSrc: Oral                 Topacio Cella,W. EDMOND

## 2016-08-22 NOTE — Anesthesia Procedure Notes (Addendum)
Date/Time: 08/22/2016 10:00 AM Performed by: Jenne Campus Pre-anesthesia Checklist: Patient identified, Emergency Drugs available, Suction available, Patient being monitored and Timeout performed Patient Re-evaluated:Patient Re-evaluated prior to inductionOxygen Delivery Method: Nasal cannula and Ambu bag

## 2016-08-22 NOTE — Anesthesia Preprocedure Evaluation (Addendum)
Anesthesia Evaluation  Patient identified by MRN, date of birth, ID band Patient awake    Reviewed: Allergy & Precautions, H&P , NPO status , Patient's Chart, lab work & pertinent test results  Airway Mallampati: II  TM Distance: >3 FB Neck ROM: Full    Dental no notable dental hx. (+) Partial Lower, Partial Upper, Dental Advisory Given   Pulmonary neg pulmonary ROS, former smoker,    Pulmonary exam normal breath sounds clear to auscultation       Cardiovascular hypertension, On Medications + CAD and + Cardiac Stents  negative cardio ROS  + dysrhythmias Atrial Fibrillation  Rhythm:Irregular Rate:Normal     Neuro/Psych negative neurological ROS  negative psych ROS   GI/Hepatic negative GI ROS, Neg liver ROS, GERD  Controlled,  Endo/Other  negative endocrine ROSHypothyroidism   Renal/GU negative Renal ROS  negative genitourinary   Musculoskeletal   Abdominal   Peds  Hematology negative hematology ROS (+)   Anesthesia Other Findings   Reproductive/Obstetrics negative OB ROS                            Anesthesia Physical Anesthesia Plan  ASA: III  Anesthesia Plan: General   Post-op Pain Management:    Induction: Intravenous  Airway Management Planned: Nasal Cannula  Additional Equipment:   Intra-op Plan:   Post-operative Plan:   Informed Consent: I have reviewed the patients History and Physical, chart, labs and discussed the procedure including the risks, benefits and alternatives for the proposed anesthesia with the patient or authorized representative who has indicated his/her understanding and acceptance.   Dental advisory given  Plan Discussed with: CRNA  Anesthesia Plan Comments:         Anesthesia Quick Evaluation

## 2016-08-22 NOTE — Progress Notes (Signed)
TEE/DCCV Pt sedated by anesthesia with diprovan 350 mg IV total by anesthesia.  TEE LV function normal No LAA thrombus  Pt subsequently had DCCV with 120 J to sinus rhythm; no immediate complications. Continue apixaban. Kirk Ruths, MD

## 2016-08-22 NOTE — H&P (Signed)
ATRIAL FIB OFFICE VISIT   08/20/2016 [place] [facility]  Sherran Needs, NP  Cardiology   Persistent atrial fibrillation [facility])  Dx   Atrial Fibrillation; Referred by [name], MD  Reason for Visit   Additional Documentation   Vitals:   BP 110/86 (BP Location: Left Arm, Patient Position: Sitting, Cuff Size: Large)   Pulse  126   Ht 5\' 11"  (1.803 m)   Wt 254 lb 9.6 oz (115.5 kg)   BMI 35.51 kg/m   BSA 2.41 m      More Vitals   Flowsheets:   Custom Formula Data,   Anthropometrics,   MEWS Score     Encounter Info:   Billing Info,   History,   Allergies,   Detailed Report     All Notes   Progress Notes by Sherran Needs, NP at 08/20/2016 10:02 AM   Author: Sherran Needs, NP Author Type: Nurse Practitioner Filed: 08/20/2016 11:46 AM  Note Status: Signed Cosign: Cosign Not Required Date of Service: 08/20/2016 10:02 AM  Editor: Sherran Needs, NP (Nurse Practitioner)    Patient ID: Dorris Singh, male   DOB: Oct 24, 1945, 71 y.o.   MRN: [id number]    Primary Care Physician: [name], MD Referring Physician: [name]   ASHLY GOETHE is a 71 y.o. male with a h/o afib . He had an ablation in 2016,  failed amiodarone with recent issues with recurrent afib. He was evaluated by Dr. [name] 02/08/16 and it was felt that he would benefit from repeat ablation which he had 04/01/16. On f/u he was in afib and had been in since the week before. He was set up for a cardioversion 05/05/16, which was successful and on f/u today, he was staying in rhythm. He had a cardiac cath for persistent shortness of breath and it did show an area of the RCA that is 70% stenosed which is to be managed medically. Statins were recommended but when he saw Dr. [name], he said that he could not take statins due ot myalgias. On questioning pt he only tried one in the past and was encouraged to discuss with his new PCP which which he establishes with tomorrow to explore this again.  He had  been staying in [place] but had a back steroid injection, 4/16 and went back into afib on Friday which has persisted. Increased his cardizem to 240 am and 120 pm and still with rvr He also had to increase lasix to 40 mg bid.Marland KitchenHe is in the clinic today saying that he does not feel well in afib and wishes to be cardioverted. I explored the maze/bypass option that Dr. [name] had discussed in his note, but he states there is no way he is ready for that. He did stop apixaban for the back injection x 3 days and restarted 4/17.  This could have been the trigger.Given option to wait 3 weeks or go ahead with a TEE, he would prefer the latter.  Today, he denies symptoms of palpitations, chest pain, shortness of breath, orthopnea, PND, lower extremity edema, dizziness, presyncope, syncope, or neurologic sequela. The patient is tolerating medications without difficulties and is otherwise without complaint today.       Past Medical History:  Diagnosis Date  . Arthritis   . Atrial fibrillation ([place])   . BPH (benign prostatic hypertrophy)   . CAD (coronary artery disease)    a. s/p DES 05/2014 at [facility]  . Complication of anesthesia  BECAME COMBATIVE  . Dysrhythmia   . Femur fracture ([place])   . GERD (gastroesophageal reflux disease)    occasional TUMS  . Gout   . History of gout   . History of kidney stones   . Hypertension   . Hypothyroidism   . Persistent atrial fibrillation ([place])   . Precancerous skin lesion         Past Surgical History:  Procedure Laterality Date  . arm fracture surgery Left   . CARDIOVERSION [name] 12/09/2014   Procedure: CARDIOVERSION;  Surgeon: [name], MD;  Location: [facility] OR;  Service: Cardiovascular;  Laterality: [name];  . CARDIOVERSION [name] 05/05/2016   Procedure: CARDIOVERSION;  Surgeon: [name], MD;  Location: [place];  Service: Cardiovascular;  Laterality: [name];  . CATARACTS     EXCISION  . CERVICAL SPINE SURGERY    . CHOLECYSTECTOMY  [name] 01/03/2013   Procedure: LAPAROSCOPIC CHOLECYSTECTOMY;  Surgeon: [name], MD;  [name];  Service: General;  Laterality: [name];  . ELECTROPHYSIOLOGIC STUDY [name] 03/15/2015   Procedure: Atrial Fibrillation Ablation;  Surgeon: [name], MD;  Location: [place] CV LAB;  Service: Cardiovascular;  Laterality: [name];  . ELECTROPHYSIOLOGIC STUDY [name] 04/01/2016   Procedure: Atrial Fibrillation Ablation;  Surgeon: [name], MD;  Location: [place] CV LAB;  Service: Cardiovascular;  Laterality: [name];  . ERCP [name] 01/02/2013   Procedure: ENDOSCOPIC RETROGRADE CHOLANGIOPANCREATOGRAPHY (ERCP);  Surgeon: [name], MD;  Location: [name];  Service: Endoscopy;  Laterality: [name];  . FEMUR FRACTURE SURGERY    . LIVER BIOPSY [name] 01/03/2013   Procedure: LIVER BIOPSY;  Surgeon: [name], MD;  Location: [name];  Service: General;  Laterality: [name];  . RIGHT/LEFT HEART CATH AND CORONARY ANGIOGRAPHY [name] 07/04/2016   Procedure: Right/Left Heart Cath and Coronary Angiography;  Surgeon: Belva Crome, MD;  Location: [place] CV LAB;  Service: Cardiovascular;  Laterality: [name];  . SPHINCTEROTOMY [name] 01/02/2013   Procedure: SPHINCTEROTOMY;  Surgeon: [name], MD;  Location[name];  Service: Endoscopy;  Laterality: [name];  Stone Extraction  . TOTAL KNEE ARTHROPLASTY Right 05/30/2013   Procedure: RIGHT TOTAL KNEE ARTHROPLASTY;  Surgeon: [name], MD;  Location: [name];  Service: Orthopedics;  Laterality: Right;          Current Outpatient Prescriptions  Medication Sig Dispense Refill  . allopurinol (ZYLOPRIM) 300 MG tablet Take 300 mg by mouth every morning.     [name] apixaban (ELIQUIS) 5 MG TABS tablet Take 1 tablet (5 mg total) by mouth 2 (two) times daily. 60 tablet 11  . diltiazem (CARDIZEM CD) 120 MG 24 hr capsule Take 1 capsule (120 mg total) by mouth 2 (two) times daily. 180 capsule 3  . furosemide (LASIX) 40 MG tablet Take 1 tablet (40 mg total) by mouth daily. 90  tablet 3  . HYDROcodone-acetaminophen (NORCO/VICODIN) 5-325 MG tablet Take 1 tablet by mouth every 6 (six) hours as needed for moderate pain.    [name] SYNTHROID 150 MCG tablet Take 150 mcg by mouth daily before breakfast.   0  . tamsulosin (FLOMAX) 0.4 MG CAPS capsule Take 0.4 mg by mouth daily.    . nitroGLYCERIN (NITROSTAT) 0.4 MG SL tablet Place 0.4 mg under the tongue every 5 (five) minutes x 3 doses as needed for chest pain.      No current facility-administered medications for this encounter.     No Known Allergies  Social History  Social History  . Marital status: Widowed    Spouse name: [name]  . Number of children: [name]  . Years of education: [name]      Occupational History  . Not on file.         Social History Main Topics  . Smoking status: Former Smoker    Quit date: 05/23/1973  . Smokeless tobacco: Never Used  . Alcohol use 0.0 oz/week     Comment: RARE  . Drug use: No  . Sexual activity: Not on file       Other Topics Concern  . Not on file      Social History Narrative  . No narrative on file         Family History  Problem Relation Age of Onset  . Kidney Stones Mother     ROS- All systems are reviewed and negative except as per the HPI above  Physical Exam:    Vitals:   08/20/16 0937  BP: 110/86  Pulse: (!) 126  Weight: 254 lb 9.6 oz (115.5 kg)  Height: 5\' 11"  (1.803 m)    GEN- The patient is well appearing, alert and oriented x 3 today.   Head- normocephalic, atraumatic Eyes-  Sclera clear, conjunctiva pink Ears- hearing intact Oropharynx- clear Neck- supple, no JVP Lymph- no cervical lymphadenopathy Lungs- Clear to ausculation bilaterally, normal work of breathing Heart- regular rate and rhythm, no murmurs, rubs or gallops, PMI not laterally displaced GI- soft, NT, ND, + BS Extremities- no clubbing, cyanosis, or edema MS- no significant deformity or atrophy Skin- no rash or lesion Psych- euthymic  mood, full affect Neuro- strength and sensation are intact  EKG - SR at 64 bpm, pr int 188 ms, qrs int 92 ms, qtc 414 ms Epic records reviewed Dr. Jackalyn Lombard noted reviewed   Assessment and Plan:    1. Persistent symptomatic afib s/p ablation 04/01/16 Successful cardioversion 05/05/16 Unfortunately has returned to afib, possible trigger was steroid back injection 4/16 Will plan on TEE guided cardioversion this Friday 4/27 Continue diltazem Continue eliquis without missed doses  2. Acute diastolic dysfunction stable Continue  daily lasix at 40 mg bid  Weight is actually down from 265 lbs on last office visit in March to 254 lbs  3. HTN Stable  4. Morbid obesity Weight loss  strongly advised  5. CAD No chest pain Discussed possible future bypass/maze He is ready at this time to consider Really needs statin therapy and can discuss further with new PCP, Dr. Merlyn Albert when he establishes tomorrow   F/u afib clinic one week s/p cardioversion  Butch Penny C. Carroll, [facility] [address] [place], [place] [address] [phone]      For TEE/DCCV No changes Kirk Ruths, MD

## 2016-08-22 NOTE — Transfer of Care (Signed)
Immediate Anesthesia Transfer of Care Note  Patient: Wayne Ortiz  Procedure(s) Performed: Procedure(s): TRANSESOPHAGEAL ECHOCARDIOGRAM (TEE) ([name]) CARDIOVERSION ([name])  Patient Location: Endoscopy Unit  Anesthesia Type:General  Level of Consciousness: awake, oriented and patient cooperative  Airway & Oxygen Therapy: Patient Spontanous Breathing and Patient connected to face mask oxygen  Post-op Assessment: Report given to RN and Post -op Vital signs reviewed and stable  Post vital signs: Reviewed  Last Vitals:  Vitals:   08/22/16 0856  BP: (!) 134/91  Pulse: 97  Resp: 16  Temp: 36.8 C    Last Pain:  Vitals:   08/22/16 0856  TempSrc: Oral         Complications: No apparent anesthesia complications

## 2016-08-22 NOTE — Discharge Instructions (Signed)
Transesophageal Echocardiogram Transesophageal echocardiography (TEE) is a picture test of your heart using sound waves. The pictures taken can give very detailed pictures of your heart. This can help your doctor see if there are problems with your heart. TEE can check:  If your heart has blood clots in it.  How well your heart valves are working.  If you have an infection on the inside of your heart.  Some of the major arteries of your heart.  If your heart valve is working after a Office manager.  Your heart before a procedure that uses a shock to your heart to get the rhythm back to normal. What happens before the procedure?  Do not eat or drink for 6 hours before the procedure or as told by your doctor.  Make plans to have someone drive you home after the procedure. Do not drive yourself home.  An IV tube will be put in your arm. What happens during the procedure?  You will be given a medicine to help you relax (sedative). It will be given through the IV tube.  A numbing medicine will be sprayed or gargled in the back of your throat to help numb it.  The tip of the probe is placed into the back of your mouth. You will be asked to swallow. This helps to pass the probe into your esophagus.  Once the tip of the probe is in the right place, your doctor can take pictures of your heart.  You may feel pressure at the back of your throat. What happens after the procedure?  You will be taken to a recovery area so the sedative can wear off.  Your throat may be sore and scratchy. This will go away slowly over time.  You will go home when you are fully awake and able to swallow liquids.  You should have someone stay with you for the next 24 hours.  Do not drive or operate machinery for the next 24 hours. This information is not intended to replace advice given to you by your health care provider. Make sure you discuss any questions you have with your health care provider. Document  Released: 02/09/2009 Document Revised: 09/20/2015 Document Reviewed: 10/14/2012 Elsevier Interactive Patient Education  2017 [employer].   Electrical Cardioversion, Care After This sheet gives you information about how to care for yourself after your procedure. Your health care provider may also give you more specific instructions. If you have problems or questions, contact your health care provider. What can I expect after the procedure? After the procedure, it is common to have:  Some redness on the skin where the shocks were given. Follow these instructions at home:  Do not drive for 24 hours if you were given a medicine to help you relax (sedative).  Take over-the-counter and prescription medicines only as told by your health care provider.  Ask your health care provider how to check your pulse. Check it often.  Rest for 48 hours after the procedure or as told by your health care provider.  Avoid or limit your caffeine use as told by your health care provider. Contact a health care provider if:  You feel like your heart is beating too quickly or your pulse is not regular.  You have a serious muscle cramp that does not go away. Get help right away if:  You have discomfort in your chest.  You are dizzy or you feel faint.  You have trouble breathing or you are short of breath.  Your speech is slurred.  You have trouble moving an arm or leg on one side of your body.  Your fingers or toes turn cold or blue. This information is not intended to replace advice given to you by your health care provider. Make sure you discuss any questions you have with your health care provider. Document Released: 02/02/2013 Document Revised: 11/16/2015 Document Reviewed: 10/19/2015 Elsevier Interactive Patient Education  2017 [employer].

## 2016-08-25 ENCOUNTER — Encounter (HOSPITAL_COMMUNITY): Payer: Self-pay | Admitting: Cardiology

## 2016-08-28 ENCOUNTER — Encounter (HOSPITAL_COMMUNITY): Payer: Self-pay | Admitting: Nurse Practitioner

## 2016-08-28 ENCOUNTER — Ambulatory Visit (HOSPITAL_COMMUNITY)
Admission: RE | Admit: 2016-08-28 | Discharge: 2016-08-28 | Disposition: A | Discharge status: Home / Self Care | Payer: Medicare HMO | Source: Ambulatory Visit | Attending: Nurse Practitioner | Admitting: Nurse Practitioner

## 2016-08-28 VITALS — BP 114/76 | HR 97 | Ht 71.0 in | Wt 252.8 lb

## 2016-08-28 DIAGNOSIS — I2510 Atherosclerotic heart disease of native coronary artery without angina pectoris: Secondary | ICD-10-CM | POA: Diagnosis not present

## 2016-08-28 DIAGNOSIS — I4819 Other persistent atrial fibrillation: Secondary | ICD-10-CM

## 2016-08-28 DIAGNOSIS — E039 Hypothyroidism, unspecified: Secondary | ICD-10-CM | POA: Insufficient documentation

## 2016-08-28 DIAGNOSIS — K219 Gastro-esophageal reflux disease without esophagitis: Secondary | ICD-10-CM | POA: Insufficient documentation

## 2016-08-28 DIAGNOSIS — N400 Enlarged prostate without lower urinary tract symptoms: Secondary | ICD-10-CM | POA: Insufficient documentation

## 2016-08-28 DIAGNOSIS — Z87891 Personal history of nicotine dependence: Secondary | ICD-10-CM | POA: Diagnosis not present

## 2016-08-28 DIAGNOSIS — I10 Essential (primary) hypertension: Secondary | ICD-10-CM | POA: Diagnosis not present

## 2016-08-28 DIAGNOSIS — Z79899 Other long term (current) drug therapy: Secondary | ICD-10-CM | POA: Insufficient documentation

## 2016-08-28 DIAGNOSIS — Z6835 Body mass index (BMI) 35.0-35.9, adult: Secondary | ICD-10-CM | POA: Diagnosis not present

## 2016-08-28 DIAGNOSIS — M109 Gout, unspecified: Secondary | ICD-10-CM | POA: Insufficient documentation

## 2016-08-28 DIAGNOSIS — Z7901 Long term (current) use of anticoagulants: Secondary | ICD-10-CM | POA: Insufficient documentation

## 2016-08-28 DIAGNOSIS — I481 Persistent atrial fibrillation: Secondary | ICD-10-CM | POA: Diagnosis not present

## 2016-08-28 NOTE — Progress Notes (Signed)
Patient ID: Wayne Ortiz, male   DOB: 09-20-1945, 71 y.o.   MRN: [id number]    Primary Care Physician: Dr. Merlyn Albert  Referring Physician: [name]   SAHID BORBA is a 71 y.o. male with a h/o afib . He had an ablation in 2016,  failed amiodarone with recent issues with recurrent afib. He was evaluated by Dr. [name] 02/08/16 and it was felt that he would benefit from repeat ablation which he had 04/01/16. On f/u he was in afib and had been in since the week before. He was set up for a cardioversion 05/05/16, which was successful and on f/u today, he was staying in rhythm. He had a cardiac cath for persistent shortness of breath and it did show an area of the RCA that is 70% stenosed which is to be managed medically. Statins were recommended but when he saw Dr. [name], he said that he could not take statins due ot myalgias. On questioning pt he only tried one in the past and was encouraged to discuss with his new PCP which which he establishes with tomorrow to explore this again.  He had been staying in [place] but had a back steroid injection, 4/16 and went back into afib on Friday which has persisted. Increased his cardizem to 240 am and 120 pm and still with rvr He also had to increase lasix to 40 mg bid.Marland KitchenHe is in the clinic today saying that he does not feel well in afib and wishes to be cardioverted. I explored the maze/bypass option that Dr. [name] had discussed in his note, but he states there is no way he is ready for that. He did stop apixaban for the back injection x 3 days and restarted 4/17.  This could have been the trigger.Given option to wait 3 weeks or go ahead with a TEE, he would prefer the latter.  F/u Afib clinic, s/p successful cardioversion. He feels improved. LLE improved in SR, he has dropped back to lasix 40 mg daily. Continues on eliquis 5 mg bid for chadsvasc score of at least 3.  Today, he denies symptoms of palpitations, chest pain, shortness of breath, orthopnea, PND, lower extremity  edema, dizziness, presyncope, syncope, or neurologic sequela. The patient is tolerating medications without difficulties and is otherwise without complaint today.   Past Medical History:  Diagnosis Date  . Arthritis   . Atrial fibrillation ([place])   . BPH (benign prostatic hypertrophy)   . CAD (coronary artery disease)    a. s/p DES 05/2014 at [facility]  . Complication of anesthesia    BECAME COMBATIVE  . Dysrhythmia   . Femur fracture ([place])   . GERD (gastroesophageal reflux disease)    occasional TUMS  . Gout   . History of gout   . History of kidney stones   . Hypertension   . Hypothyroidism   . Persistent atrial fibrillation ([place])   . Precancerous skin lesion    Past Surgical History:  Procedure Laterality Date  . arm fracture surgery Left   . CARDIOVERSION [name] 12/09/2014   Procedure: CARDIOVERSION;  Surgeon: [name], MD;  Location: [place];  Service: Cardiovascular;  Laterality: [name];  . CARDIOVERSION [name] 05/05/2016   Procedure: CARDIOVERSION;  Surgeon: [name], MD;  Location: [place];  Service: Cardiovascular;  Laterality: [name];  . CARDIOVERSION [name] 08/22/2016   Procedure: CARDIOVERSION;  Surgeon: Lelon Perla, MD;  Location: [place];  Service: Cardiovascular;  Laterality: [name];  . CATARACTS  EXCISION  . CERVICAL SPINE SURGERY    . CHOLECYSTECTOMY [name] 01/03/2013   Procedure: LAPAROSCOPIC CHOLECYSTECTOMY;  Surgeon: [name], MD;  [name];  Service: General;  Laterality: [name];  . ELECTROPHYSIOLOGIC STUDY [name] 03/15/2015   Procedure: Atrial Fibrillation Ablation;  Surgeon: [name], MD;  Location: [place] CV LAB;  Service: Cardiovascular;  Laterality: [name];  . ELECTROPHYSIOLOGIC STUDY [name] 04/01/2016   Procedure: Atrial Fibrillation Ablation;  Surgeon: [name], MD;  Location: [place] CV LAB;  Service: Cardiovascular;  Laterality: [name];  . ERCP [name] 01/02/2013   Procedure: ENDOSCOPIC RETROGRADE CHOLANGIOPANCREATOGRAPHY (ERCP);  Surgeon:  [name], MD;  Location[name];  Service: Endoscopy;  Laterality: [name];  . FEMUR FRACTURE SURGERY    . LIVER BIOPSY [name] 01/03/2013   Procedure: LIVER BIOPSY;  Surgeon: [name], MD;  [name];  Service: General;  Laterality: [name];  . RIGHT/LEFT HEART CATH AND CORONARY ANGIOGRAPHY [name] 07/04/2016   Procedure: Right/Left Heart Cath and Coronary Angiography;  Surgeon: Belva Crome, MD;  Location: [place] CV LAB;  Service: Cardiovascular;  Laterality: [name];  . SPHINCTEROTOMY [name] 01/02/2013   Procedure: SPHINCTEROTOMY;  Surgeon: [name], MD;  [name];  Service: Endoscopy;  Laterality: [name];  Stone Extraction  . TEE WITHOUT CARDIOVERSION [name] 08/22/2016   Procedure: TRANSESOPHAGEAL ECHOCARDIOGRAM (TEE);  Surgeon: Lelon Perla, MD;  Location: [place];  Service: Cardiovascular;  Laterality: [name];  . TOTAL KNEE ARTHROPLASTY Right 05/30/2013   Procedure: RIGHT TOTAL KNEE ARTHROPLASTY;  Surgeon: [name], MD;  [name];  Service: Orthopedics;  Laterality: Right;    Current Outpatient Prescriptions  Medication Sig Dispense Refill  . allopurinol (ZYLOPRIM) 300 MG tablet Take 300 mg by mouth every morning.     [name] apixaban (ELIQUIS) 5 MG TABS tablet Take 1 tablet (5 mg total) by mouth 2 (two) times daily. 60 tablet 11  . diltiazem (CARDIZEM CD) 120 MG 24 hr capsule Take 1 capsule (120 mg total) by mouth 2 (two) times daily. (Patient taking differently: Take 240 mg by mouth daily. ) 180 capsule 3  . furosemide (LASIX) 40 MG tablet Take 1 tablet (40 mg total) by mouth daily. 90 tablet 3  . HYDROcodone-acetaminophen (NORCO/VICODIN) 5-325 MG tablet Take 1 tablet by mouth every 6 (six) hours as needed for moderate pain.    [name] SYNTHROID 150 MCG tablet Take 150 mcg by mouth daily before breakfast.   0  . tamsulosin (FLOMAX) 0.4 MG CAPS capsule Take 0.4 mg by mouth daily.    . nitroGLYCERIN (NITROSTAT) 0.4 MG SL tablet Place 0.4 mg under the tongue every 5 (five)  minutes x 3 doses as needed for chest pain.      No current facility-administered medications for this encounter.     No Known Allergies  Social History   Social History  . Marital status: Widowed    Spouse name: [name]  . Number of children: [name]  . Years of education: [name]   Occupational History  . Not on file.   Social History Main Topics  . Smoking status: Former Smoker    Quit date: 05/23/1973  . Smokeless tobacco: Never Used  . Alcohol use No  . Drug use: No  . Sexual activity: Not on file   Other Topics Concern  . Not on file   Social History Narrative  . No narrative on file    Family History  Problem Relation Age of Onset  .  Kidney Stones Mother     ROS- All systems are reviewed and negative except as per the HPI above  Physical Exam: Vitals:   08/28/16 1338  BP: 114/76  Pulse: 97  Weight: 252 lb 12.8 oz (114.7 kg)  Height: 5\' 11"  (1.803 m)    GEN- The patient is well appearing, alert and oriented x 3 today.   Head- normocephalic, atraumatic Eyes-  Sclera clear, conjunctiva pink Ears- hearing intact Oropharynx- clear Neck- supple, no JVP Lymph- no cervical lymphadenopathy Lungs- Clear to ausculation bilaterally, normal work of breathing Heart- regular rate and rhythm, no murmurs, rubs or gallops, PMI not laterally displaced GI- soft, NT, ND, + BS Extremities- no clubbing, cyanosis, or edema MS- no significant deformity or atrophy Skin- no rash or lesion Psych- euthymic mood, full affect Neuro- strength and sensation are intact  EKG - SR at 97 bpm, pr int 164 ms, qrs int 82 ms, qtc 421 ms Epic records reviewed Dr. Jackalyn Lombard noted reviewed   Assessment and Plan:    1. Persistent symptomatic afib s/p ablation 04/01/16 Successful cardioversion  08/22/16 Continue diltazem Continue eliquis without missed doses  2. Acute diastolic dysfunction stable Continue  daily lasix at 40 mg qd   3. HTN Stable  4. Morbid obesity Weight loss  strongly  advised  5. CAD No chest pain Discussed possible future bypass/maze He is not ready at this time to consider Really needs statin therapy and can discuss further with new PCP  F/u afib clinic late June  Donna C. Carroll, [facility] [address] [place], [place] [address] [phone]

## 2016-09-12 ENCOUNTER — Other Ambulatory Visit (HOSPITAL_COMMUNITY): Payer: Self-pay | Admitting: Nurse Practitioner

## 2016-09-16 DIAGNOSIS — Z125 Encounter for screening for malignant neoplasm of prostate: Secondary | ICD-10-CM | POA: Diagnosis not present

## 2016-09-16 DIAGNOSIS — E039 Hypothyroidism, unspecified: Secondary | ICD-10-CM | POA: Diagnosis not present

## 2016-09-16 DIAGNOSIS — I10 Essential (primary) hypertension: Secondary | ICD-10-CM | POA: Diagnosis not present

## 2016-09-18 DIAGNOSIS — E782 Mixed hyperlipidemia: Secondary | ICD-10-CM | POA: Diagnosis not present

## 2016-09-18 DIAGNOSIS — I482 Chronic atrial fibrillation: Secondary | ICD-10-CM | POA: Diagnosis not present

## 2016-09-18 DIAGNOSIS — E039 Hypothyroidism, unspecified: Secondary | ICD-10-CM | POA: Diagnosis not present

## 2016-09-18 DIAGNOSIS — M545 Low back pain: Secondary | ICD-10-CM | POA: Diagnosis not present

## 2016-09-18 DIAGNOSIS — M109 Gout, unspecified: Secondary | ICD-10-CM | POA: Diagnosis not present

## 2016-09-18 DIAGNOSIS — G8929 Other chronic pain: Secondary | ICD-10-CM | POA: Diagnosis not present

## 2016-09-25 ENCOUNTER — Telehealth (HOSPITAL_COMMUNITY): Payer: Self-pay | Admitting: *Deleted

## 2016-09-25 NOTE — Telephone Encounter (Signed)
Pt states he went back into afib over the past weekend. He on his own increased his cardizem to 120mg  TID. His fluid status has been stable - although he does report eating BBQ for several days and taking an extra fluid pill yesterday. His HR is in the 90-110 range. He would like to see how he does over this weekend and will call on Monday if still in afib to be seen. He did inquire in regards to MAZE procedure success rates/if it would help him. Pt maybe interested in exploring options if afib burden continues to increase. Pt will call Monday with report of how he is feeling.

## 2016-09-29 ENCOUNTER — Other Ambulatory Visit (HOSPITAL_COMMUNITY): Payer: Self-pay | Admitting: *Deleted

## 2016-09-29 MED ORDER — DILTIAZEM HCL ER COATED BEADS 120 MG PO CP24: 120.0000 mg | ORAL_CAPSULE | Freq: Two times a day (BID) | ORAL | 3 refills | Status: DC

## 2016-09-29 MED ORDER — AMIODARONE HCL 200 MG PO TABS: 200.0000 mg | ORAL_TABLET | Freq: Two times a day (BID) | ORAL | 0 refills | Status: DC

## 2016-09-30 ENCOUNTER — Telehealth (HOSPITAL_COMMUNITY): Payer: Self-pay | Admitting: *Deleted

## 2016-09-30 NOTE — Telephone Encounter (Signed)
Patient called back stating he remains in afib. He is not quite ready to commit to CABG/MAZE at this time. Discussed with Roderic Palau NP recommends reloading on Amiodarone as patient cannot afford tikosyn and sotalol caused symptomatic bradycardia in the past. Patient agreeable. He will start Amiodarone 200mg  BID and follow up next week. He will also decrease his cardizem to 120mg  BID. Pt verbalized understanding of instructions.

## 2016-10-08 ENCOUNTER — Ambulatory Visit (HOSPITAL_COMMUNITY)
Admission: RE | Admit: 2016-10-08 | Discharge: 2016-10-08 | Disposition: A | Discharge status: Home / Self Care | Payer: Medicare HMO | Source: Ambulatory Visit | Attending: Nurse Practitioner | Admitting: Nurse Practitioner

## 2016-10-08 ENCOUNTER — Encounter (HOSPITAL_COMMUNITY): Payer: Self-pay | Admitting: Nurse Practitioner

## 2016-10-08 VITALS — BP 104/70 | HR 74 | Ht 71.0 in | Wt 260.0 lb

## 2016-10-08 DIAGNOSIS — I10 Essential (primary) hypertension: Secondary | ICD-10-CM | POA: Diagnosis not present

## 2016-10-08 DIAGNOSIS — N400 Enlarged prostate without lower urinary tract symptoms: Secondary | ICD-10-CM | POA: Insufficient documentation

## 2016-10-08 DIAGNOSIS — Z9889 Other specified postprocedural states: Secondary | ICD-10-CM | POA: Insufficient documentation

## 2016-10-08 DIAGNOSIS — Z87891 Personal history of nicotine dependence: Secondary | ICD-10-CM | POA: Diagnosis not present

## 2016-10-08 DIAGNOSIS — M109 Gout, unspecified: Secondary | ICD-10-CM | POA: Diagnosis not present

## 2016-10-08 DIAGNOSIS — K219 Gastro-esophageal reflux disease without esophagitis: Secondary | ICD-10-CM | POA: Diagnosis not present

## 2016-10-08 DIAGNOSIS — E039 Hypothyroidism, unspecified: Secondary | ICD-10-CM | POA: Diagnosis not present

## 2016-10-08 DIAGNOSIS — Z7901 Long term (current) use of anticoagulants: Secondary | ICD-10-CM | POA: Diagnosis not present

## 2016-10-08 DIAGNOSIS — I4510 Unspecified right bundle-branch block: Secondary | ICD-10-CM | POA: Insufficient documentation

## 2016-10-08 DIAGNOSIS — I491 Atrial premature depolarization: Secondary | ICD-10-CM | POA: Insufficient documentation

## 2016-10-08 DIAGNOSIS — Z96651 Presence of right artificial knee joint: Secondary | ICD-10-CM | POA: Insufficient documentation

## 2016-10-08 DIAGNOSIS — I481 Persistent atrial fibrillation: Secondary | ICD-10-CM | POA: Diagnosis not present

## 2016-10-08 DIAGNOSIS — Z87442 Personal history of urinary calculi: Secondary | ICD-10-CM | POA: Diagnosis not present

## 2016-10-08 DIAGNOSIS — I2510 Atherosclerotic heart disease of native coronary artery without angina pectoris: Secondary | ICD-10-CM | POA: Insufficient documentation

## 2016-10-08 DIAGNOSIS — I4819 Other persistent atrial fibrillation: Secondary | ICD-10-CM

## 2016-10-08 MED ORDER — DILTIAZEM HCL ER COATED BEADS 120 MG PO CP24: 120.0000 mg | ORAL_CAPSULE | Freq: Every day | ORAL | 3 refills | Status: DC

## 2016-10-08 NOTE — Patient Instructions (Signed)
Your physician has recommended you make the following change in your medication:  1)Decrease cardizem to 120mg  once a day  2)On 10/15/16 decrease Amiodarone to 200mg  once a day

## 2016-10-08 NOTE — Progress Notes (Signed)
Patient ID: Wayne Ortiz, male   DOB: [birth year], 71 y.o.   MRN: [id number]    Primary Care Physician: Dr. Merlyn Albert  Referring Physician: [name]   Wayne Ortiz is a 71 y.o. male with a h/o afib . He had an ablation in 2016,  failed amiodarone with recent issues with recurrent afib. On sotalol in the past but had symptomatic bradycardia. He was evaluated by Dr. [name] 02/08/16 and it was felt that he would benefit from repeat ablation which he had 04/01/16. On f/u he was in afib and had been in since the week before. He was set up for a cardioversion 05/05/16, which was successful and on f/u today, he was staying in rhythm. He had a cardiac cath for persistent shortness of breath and it did show an area of the RCA that is 70% stenosed which is to be managed medically. Statins were recommended but when he saw Dr. [name], he said that he could not take statins due ot myalgias. On questioning pt he only tried one in the past and was encouraged to discuss with his new PCP which which he establishes with tomorrow to explore this again.  He had been staying in [place] but had a back steroid injection, 4/16 and went back into afib on Friday which has persisted. Increased his cardizem to 240 am and 120 pm and still with rvr He also had to increase lasix to 40 mg bid.Marland KitchenHe is in the clinic today saying that he does not feel well in afib and wishes to be cardioverted. I explored the maze/bypass option that Dr. [name] had discussed in his note, but he states there is no way he is ready for that. He did stop apixaban for the back injection x 3 days and restarted 4/17.  This could have been the trigger.Given option to wait 3 weeks or go ahead with a TEE, he would prefer the latter.  F/u Afib clinic, s/p successful cardioversion. He feels improved. LLE improved in SR, he has dropped back to lasix 40 mg daily. Continues on eliquis 5 mg bid for chadsvasc score of at least 3.  Called the clinic 1-2 weeks ago and had gone back to  afib. He decided to  try amiodarone again and was started on 200 mg bid. [name], [name] He felt poorly the first day he started amiodarone but is feeling better now.  Today, he denies symptoms of palpitations, chest pain, shortness of breath, orthopnea, PND, lower extremity edema, dizziness, presyncope, syncope, or neurologic sequela. The patient is tolerating medications without difficulties and is otherwise without complaint today.   Past Medical History:  Diagnosis Date  . Arthritis   . Atrial fibrillation ([place])   . BPH (benign prostatic hypertrophy)   . CAD (coronary artery disease)    a. s/p DES 05/2014 at [facility]  . Complication of anesthesia    BECAME COMBATIVE  . Dysrhythmia   . Femur fracture ([place])   . GERD (gastroesophageal reflux disease)    occasional TUMS  . Gout   . History of gout   . History of kidney stones   . Hypertension   . Hypothyroidism   . Persistent atrial fibrillation ([place])   . Precancerous skin lesion    Past Surgical History:  Procedure Laterality Date  . arm fracture surgery Left   . CARDIOVERSION [name] 12/09/2014   Procedure: CARDIOVERSION;  Surgeon: [name], MD;  Location: [place];  Service: Cardiovascular;  Laterality: [name];  . CARDIOVERSION [name] 05/05/2016   Procedure: CARDIOVERSION;  Surgeon: [name], MD;  Location: [place];  Service: Cardiovascular;  Laterality: [name];  . CARDIOVERSION [name] 08/22/2016   Procedure: CARDIOVERSION;  Surgeon: Lelon Perla, MD;  Location: [facility] ENDOSCOPY;  Service: Cardiovascular;  Laterality: [name];  . CATARACTS     EXCISION  . CERVICAL SPINE SURGERY    . CHOLECYSTECTOMY [name] 01/03/2013   Procedure: LAPAROSCOPIC CHOLECYSTECTOMY;  Surgeon: [name], MD;  [name];  Service: General;  Laterality: [name];  . ELECTROPHYSIOLOGIC STUDY [name] 03/15/2015   Procedure: Atrial Fibrillation Ablation;  Surgeon: [name], MD;  Location: [place] CV LAB;  Service: Cardiovascular;  Laterality:  [name];  . ELECTROPHYSIOLOGIC STUDY [name] 04/01/2016   Procedure: Atrial Fibrillation Ablation;  Surgeon: [name], MD;  Location: [place] CV LAB;  Service: Cardiovascular;  Laterality: [name];  . ERCP [name] 01/02/2013   Procedure: ENDOSCOPIC RETROGRADE CHOLANGIOPANCREATOGRAPHY (ERCP);  Surgeon: [name], MD;  Location: [name];  Service: Endoscopy;  Laterality: [name];  . FEMUR FRACTURE SURGERY    . LIVER BIOPSY [name] 01/03/2013   Procedure: LIVER BIOPSY;  Surgeon: [name], MD;  [name];  Service: General;  Laterality: [name];  . RIGHT/LEFT HEART CATH AND CORONARY ANGIOGRAPHY [name] 07/04/2016   Procedure: Right/Left Heart Cath and Coronary Angiography;  Surgeon: Belva Crome, MD;  Location: [place] CV LAB;  Service: Cardiovascular;  Laterality: [name];  . SPHINCTEROTOMY [name] 01/02/2013   Procedure: SPHINCTEROTOMY;  Surgeon: [name], MD;  [name];  Service: Endoscopy;  Laterality: [name];  Stone Extraction  . TEE WITHOUT CARDIOVERSION [name] 08/22/2016   Procedure: TRANSESOPHAGEAL ECHOCARDIOGRAM (TEE);  Surgeon: Lelon Perla, MD;  Location: [place];  Service: Cardiovascular;  Laterality: [name];  . TOTAL KNEE ARTHROPLASTY Right 05/30/2013   Procedure: RIGHT TOTAL KNEE ARTHROPLASTY;  Surgeon: [name], MD;  [name];  Service: Orthopedics;  Laterality: Right;    Current Outpatient Prescriptions  Medication Sig Dispense Refill  . allopurinol (ZYLOPRIM) 300 MG tablet Take 300 mg by mouth every morning.     [name] amiodarone (PACERONE) 200 MG tablet Take 1 tablet (200 mg total) by mouth 2 (two) times daily. 60 tablet 0  . apixaban (ELIQUIS) 5 MG TABS tablet Take 1 tablet (5 mg total) by mouth 2 (two) times daily. 60 tablet 11  . diltiazem (CARDIZEM CD) 120 MG 24 hr capsule Take 1 capsule (120 mg total) by mouth daily. 180 capsule 3  . furosemide (LASIX) 40 MG tablet Take 1 tablet (40 mg total) by mouth daily. May take extra tablet daily as needed for wt gain/swelling 120  tablet 1  . HYDROcodone-acetaminophen (NORCO/VICODIN) 5-325 MG tablet Take 1 tablet by mouth every 6 (six) hours as needed for moderate pain.    . nitroGLYCERIN (NITROSTAT) 0.4 MG SL tablet Place 0.4 mg under the tongue every 5 (five) minutes x 3 doses as needed for chest pain.     [name] SYNTHROID 150 MCG tablet Take 150 mcg by mouth daily before breakfast.   0  . tamsulosin (FLOMAX) 0.4 MG CAPS capsule Take 0.4 mg by mouth daily.     No current facility-administered medications for this encounter.     No Known Allergies  Social History   Social History  . Marital status: Widowed    Spouse name: [name]  . Number of children: [name]  . Years of education: [name]   Occupational History  .  Not on file.   Social History Main Topics  . Smoking status: Former Smoker    Quit date: 05/23/1973  . Smokeless tobacco: Never Used  . Alcohol use No  . Drug use: No  . Sexual activity: Not on file   Other Topics Concern  . Not on file   Social History Narrative  . No narrative on file    Family History  Problem Relation Age of Onset  . Kidney Stones Mother     ROS- All systems are reviewed and negative except as per the HPI above  Physical Exam: Vitals:   10/08/16 0938  BP: 104/70  Pulse: 74  SpO2: 95%  Weight: 260 lb (117.9 kg)  Height: 5\' 11"  (1.803 m)    GEN- The patient is well appearing, alert and oriented x 3 today.   Head- normocephalic, atraumatic Eyes-  Sclera clear, conjunctiva pink Ears- hearing intact Oropharynx- clear Neck- supple, no JVP Lymph- no cervical lymphadenopathy Lungs- Clear to ausculation bilaterally, normal work of breathing Heart- regular rate and rhythm, no murmurs, rubs or gallops, PMI not laterally displaced GI- soft, NT, ND, + BS Extremities- no clubbing, cyanosis, or edema MS- no significant deformity or atrophy Skin- no rash or lesion Psych- euthymic mood, full affect Neuro- strength and sensation are intact  EKG - SR at 74 bpm, pr int 192 ms,  qrs int 92 ms, qtc 455 ms Epic records reviewed  Assessment and Plan:    1. Persistent symptomatic afib s/p ablation 04/01/16 Successful cardioversion  08/22/16 but ERAF Continue diltiazem but reduce to 120 mg daily Continue amio load at 200 mg bid x one more week then reduce to 1 a day Continue eliquis without missed doses  2. Acute diastolic dysfunction stable Continue  daily lasix at 40 mg qd   3. HTN Stable  4. Morbid obesity Weight loss  strongly advised  5. CAD No chest pain Discussed possible future bypass/maze He is not ready at this time to consider Really needs statin therapy and can discuss further with new PCP  F/u afib clinic  In 2 weeks  Butch Penny C. Dimple Bastyr, [facility] [address] [place], [place] [address] [phone]

## 2016-10-23 ENCOUNTER — Ambulatory Visit (HOSPITAL_COMMUNITY)
Admission: RE | Admit: 2016-10-23 | Discharge: 2016-10-23 | Disposition: A | Discharge status: Home / Self Care | Payer: Medicare HMO | Source: Ambulatory Visit | Attending: Nurse Practitioner | Admitting: Nurse Practitioner

## 2016-10-23 ENCOUNTER — Encounter (HOSPITAL_COMMUNITY): Payer: Self-pay | Admitting: Nurse Practitioner

## 2016-10-23 VITALS — BP 114/82 | HR 68 | Ht 71.0 in | Wt 262.2 lb

## 2016-10-23 DIAGNOSIS — K219 Gastro-esophageal reflux disease without esophagitis: Secondary | ICD-10-CM | POA: Diagnosis not present

## 2016-10-23 DIAGNOSIS — I10 Essential (primary) hypertension: Secondary | ICD-10-CM | POA: Insufficient documentation

## 2016-10-23 DIAGNOSIS — Z87442 Personal history of urinary calculi: Secondary | ICD-10-CM | POA: Insufficient documentation

## 2016-10-23 DIAGNOSIS — Z9049 Acquired absence of other specified parts of digestive tract: Secondary | ICD-10-CM | POA: Insufficient documentation

## 2016-10-23 DIAGNOSIS — M109 Gout, unspecified: Secondary | ICD-10-CM | POA: Diagnosis not present

## 2016-10-23 DIAGNOSIS — I481 Persistent atrial fibrillation: Secondary | ICD-10-CM | POA: Diagnosis not present

## 2016-10-23 DIAGNOSIS — E039 Hypothyroidism, unspecified: Secondary | ICD-10-CM | POA: Insufficient documentation

## 2016-10-23 DIAGNOSIS — Z841 Family history of disorders of kidney and ureter: Secondary | ICD-10-CM | POA: Diagnosis not present

## 2016-10-23 DIAGNOSIS — Z9889 Other specified postprocedural states: Secondary | ICD-10-CM | POA: Insufficient documentation

## 2016-10-23 DIAGNOSIS — Z87891 Personal history of nicotine dependence: Secondary | ICD-10-CM | POA: Insufficient documentation

## 2016-10-23 DIAGNOSIS — Z96651 Presence of right artificial knee joint: Secondary | ICD-10-CM | POA: Insufficient documentation

## 2016-10-23 DIAGNOSIS — N400 Enlarged prostate without lower urinary tract symptoms: Secondary | ICD-10-CM | POA: Diagnosis not present

## 2016-10-23 DIAGNOSIS — Z7901 Long term (current) use of anticoagulants: Secondary | ICD-10-CM | POA: Diagnosis not present

## 2016-10-23 DIAGNOSIS — I2510 Atherosclerotic heart disease of native coronary artery without angina pectoris: Secondary | ICD-10-CM | POA: Diagnosis not present

## 2016-10-23 DIAGNOSIS — Z79899 Other long term (current) drug therapy: Secondary | ICD-10-CM | POA: Diagnosis not present

## 2016-10-23 DIAGNOSIS — I4819 Other persistent atrial fibrillation: Secondary | ICD-10-CM

## 2016-10-23 MED ORDER — AMIODARONE HCL 200 MG PO TABS: 200.0000 mg | ORAL_TABLET | Freq: Every day | ORAL | 2 refills | Status: DC

## 2016-10-23 NOTE — Progress Notes (Signed)
Patient ID: Wayne Ortiz, male   DOB: [birth year], 71 y.o.   MRN: [id number]    Primary Care Physician: Dr. Merlyn Albert  Referring Physician: [name]   Wayne Ortiz is a 71 y.o. male with a h/o afib . He had an ablation in 2016,  failed amiodarone with recent issues with recurrent afib. On sotalol in the past but had symptomatic bradycardia. He was evaluated by Dr. [name] 02/08/16 and it was felt that he would benefit from repeat ablation which he had 04/01/16. On f/u he was in afib and had been in since the week before. He was set up for a cardioversion 05/05/16, which was successful and on f/u today, he was staying in rhythm. He had a cardiac cath for persistent shortness of breath and it did show an area of the RCA that is 70% stenosed which is to be managed medically. Statins were recommended but when he saw Dr. [name], he said that he could not take statins due ot myalgias. On questioning pt he only tried one in the past and was encouraged to discuss with his new PCP which which he establishes with tomorrow to explore this again.  He had been staying in [place] but had a back steroid injection, 4/16 and went back into afib on Friday which has persisted. Increased his cardizem to 240 am and 120 pm and still with rvr He also had to increase lasix to 40 mg bid.Marland KitchenHe is in the clinic today saying that he does not feel well in afib and wishes to be cardioverted. I explored the maze/bypass option that Dr. [name] had discussed in his note, but he states there is no way he is ready for that. He did stop apixaban for the back injection x 3 days and restarted 4/17.  This could have been the trigger.Given option to wait 3 weeks or go ahead with a TEE, he would prefer the latter.  F/u Afib clinic, s/p successful cardioversion. He feels improved. LLE improved in SR, he has dropped back to lasix 40 mg daily. Continues on eliquis 5 mg bid for chadsvasc score of at least 3.  Called the clinic 1-2 weeks ago and had gone back to  afib. He decided to  try amiodarone again and was started on 200 mg bid. [name], he is in [place]. He felt poorly the first day he started amiodarone but is feeling better now.  F/u in afib clinic, he is in SR on amiodarone one pill a day. He states that he feels the best now that he has in a long time. He does not feel that he has had any afib since start of the drug. Continues on eliquis.  Today, he denies symptoms of palpitations, chest pain, shortness of breath, orthopnea, PND, lower extremity edema, dizziness, presyncope, syncope, or neurologic sequela. The patient is tolerating medications without difficulties and is otherwise without complaint today.   Past Medical History:  Diagnosis Date  . Arthritis   . Atrial fibrillation ([place])   . BPH (benign prostatic hypertrophy)   . CAD (coronary artery disease)    a. s/p DES 05/2014 at [facility]  . Complication of anesthesia    BECAME COMBATIVE  . Dysrhythmia   . Femur fracture ([place])   . GERD (gastroesophageal reflux disease)    occasional TUMS  . Gout   . History of gout   . History of kidney stones   . Hypertension   . Hypothyroidism   . Persistent  atrial fibrillation ([place])   . Precancerous skin lesion    Past Surgical History:  Procedure Laterality Date  . arm fracture surgery Left   . CARDIOVERSION [name] 12/09/2014   Procedure: CARDIOVERSION;  Surgeon: [name], MD;  Location: [place];  Service: Cardiovascular;  Laterality: [name];  . CARDIOVERSION [name] 05/05/2016   Procedure: CARDIOVERSION;  Surgeon: [name], MD;  Location: [place];  Service: Cardiovascular;  Laterality: [name];  . CARDIOVERSION [name] 08/22/2016   Procedure: CARDIOVERSION;  Surgeon: Lelon Perla, MD;  Location: [facility] ENDOSCOPY;  Service: Cardiovascular;  Laterality: [name];  . CATARACTS     EXCISION  . CERVICAL SPINE SURGERY    . CHOLECYSTECTOMY [name] 01/03/2013   Procedure: LAPAROSCOPIC CHOLECYSTECTOMY;  Surgeon: [name], MD;  [name];   Service: General;  Laterality: [name];  . ELECTROPHYSIOLOGIC STUDY [name] 03/15/2015   Procedure: Atrial Fibrillation Ablation;  Surgeon: [name], MD;  Location: [place] CV LAB;  Service: Cardiovascular;  Laterality: [name];  . ELECTROPHYSIOLOGIC STUDY [name] 04/01/2016   Procedure: Atrial Fibrillation Ablation;  Surgeon: [name], MD;  Location: [place] CV LAB;  Service: Cardiovascular;  Laterality: [name];  . ERCP [name] 01/02/2013   Procedure: ENDOSCOPIC RETROGRADE CHOLANGIOPANCREATOGRAPHY (ERCP);  Surgeon: [name], MD;  [name];  Service: Endoscopy;  Laterality: [name];  . FEMUR FRACTURE SURGERY    . LIVER BIOPSY [name] 01/03/2013   Procedure: LIVER BIOPSY;  Surgeon: [name], MD;  [name];  Service: General;  Laterality: [name];  . RIGHT/LEFT HEART CATH AND CORONARY ANGIOGRAPHY [name] 07/04/2016   Procedure: Right/Left Heart Cath and Coronary Angiography;  Surgeon: Belva Crome, MD;  Location: [place] CV LAB;  Service: Cardiovascular;  Laterality: [name];  . SPHINCTEROTOMY [name] 01/02/2013   Procedure: SPHINCTEROTOMY;  Surgeon: [name], MD;  [name];  Service: Endoscopy;  Laterality: [name];  Stone Extraction  . TEE WITHOUT CARDIOVERSION [name] 08/22/2016   Procedure: TRANSESOPHAGEAL ECHOCARDIOGRAM (TEE);  Surgeon: Lelon Perla, MD;  Location: [place];  Service: Cardiovascular;  Laterality: [name];  . TOTAL KNEE ARTHROPLASTY Right 05/30/2013   Procedure: RIGHT TOTAL KNEE ARTHROPLASTY;  Surgeon: [name], MD;  [name];  Service: Orthopedics;  Laterality: Right;    Current Outpatient Prescriptions  Medication Sig Dispense Refill  . allopurinol (ZYLOPRIM) 300 MG tablet Take 300 mg by mouth every morning.     [name] amiodarone (PACERONE) 200 MG tablet Take 1 tablet (200 mg total) by mouth daily. 90 tablet 2  . apixaban (ELIQUIS) 5 MG TABS tablet Take 1 tablet (5 mg total) by mouth 2 (two) times daily. 60 tablet 11  . diltiazem (CARDIZEM CD) 120 MG 24 hr  capsule Take 1 capsule (120 mg total) by mouth daily. 180 capsule 3  . furosemide (LASIX) 40 MG tablet Take 1 tablet (40 mg total) by mouth daily. May take extra tablet daily as needed for wt gain/swelling 120 tablet 1  . HYDROcodone-acetaminophen (NORCO/VICODIN) 5-325 MG tablet Take 1 tablet by mouth every 6 (six) hours as needed for moderate pain.    [name] SYNTHROID 150 MCG tablet Take 150 mcg by mouth daily before breakfast.   0  . tamsulosin (FLOMAX) 0.4 MG CAPS capsule Take 0.4 mg by mouth daily.    . nitroGLYCERIN (NITROSTAT) 0.4 MG SL tablet Place 0.4 mg under the tongue every 5 (five) minutes x 3 doses as needed for chest pain.      No current facility-administered medications  for this encounter.     No Known Allergies  Social History   Social History  . Marital status: Widowed    Spouse name: [name]  . Number of children: [name]  . Years of education: [name]   Occupational History  . Not on file.   Social History Main Topics  . Smoking status: Former Smoker    Quit date: 05/23/1973  . Smokeless tobacco: Never Used  . Alcohol use No  . Drug use: No  . Sexual activity: Not on file   Other Topics Concern  . Not on file   Social History Narrative  . No narrative on file    Family History  Problem Relation Age of Onset  . Kidney Stones Mother     ROS- All systems are reviewed and negative except as per the HPI above  Physical Exam: Vitals:   10/23/16 0958  BP: 114/82  Pulse: 68  Weight: 262 lb 3.2 oz (118.9 kg)  Height: 5\' 11"  (1.803 m)    GEN- The patient is well appearing, alert and oriented x 3 today.   Head- normocephalic, atraumatic Eyes-  Sclera clear, conjunctiva pink Ears- hearing intact Oropharynx- clear Neck- supple, no JVP Lymph- no cervical lymphadenopathy Lungs- Clear to ausculation bilaterally, normal work of breathing Heart- regular rate and rhythm, no murmurs, rubs or gallops, PMI not laterally displaced GI- soft, NT, ND, + BS Extremities- no  clubbing, cyanosis, or edema MS- no significant deformity or atrophy Skin- no rash or lesion Psych- euthymic mood, full affect Neuro- strength and sensation are intact  EKG - SR at 74 bpm, pr int 192 ms, qrs int 92 ms, qtc 455 ms Epic records reviewed  Assessment and Plan:    1. Persistent symptomatic afib s/p ablation 04/01/16 Successful cardioversion  08/22/16 but ERAF Continue diltiazem at 120 mg daily Continue amiodarone at 200 mg a day Continue eliquis without missed doses for chadsvasc score of at least   2. Acute diastolic dysfunction Stable Continue daily lasix at 40 mg qd at least 3  3. HTN Stable  4. Morbid obesity Weight loss  strongly advised  5. CAD No recent chest pain Discussed possible future bypass/maze He is not ready at this time to consider Really needs statin therapy and can encouraged to discuss further with  PCP, states myalgias in the past  F/u with Dr. [name] as scheduled in September afib clinic as needed  [place]. Arnell Mausolf, [facility] [address] [place],  [address] [phone]

## 2016-11-17 ENCOUNTER — Other Ambulatory Visit (HOSPITAL_COMMUNITY): Payer: Self-pay | Admitting: Nurse Practitioner

## 2016-11-21 ENCOUNTER — Telehealth: Payer: Self-pay | Admitting: Pharmacist

## 2016-11-21 NOTE — Telephone Encounter (Signed)
Received fax from [facility] that pt is scheduled for an ESI on 12/11/16 with request to hold Eliquis for 3 days prior. Pt takes Eliquis for afib with no hx of stroke. DCCV 3 months ago. Ok to hold for 3 days per protocol. Clearance faxed to [phone].

## 2016-12-11 DIAGNOSIS — M5416 Radiculopathy, lumbar region: Secondary | ICD-10-CM | POA: Diagnosis not present

## 2016-12-11 DIAGNOSIS — M4726 Other spondylosis with radiculopathy, lumbar region: Secondary | ICD-10-CM | POA: Diagnosis not present

## 2016-12-19 ENCOUNTER — Encounter: Payer: Self-pay | Admitting: Internal Medicine

## 2017-01-08 ENCOUNTER — Encounter: Payer: Self-pay | Admitting: Internal Medicine

## 2017-01-08 ENCOUNTER — Ambulatory Visit (INDEPENDENT_AMBULATORY_CARE_PROVIDER_SITE_OTHER): Payer: Medicare HMO | Admitting: Internal Medicine

## 2017-01-08 VITALS — BP 120/84 | HR 71 | Ht 71.0 in | Wt 260.2 lb

## 2017-01-08 DIAGNOSIS — I4819 Other persistent atrial fibrillation: Secondary | ICD-10-CM

## 2017-01-08 DIAGNOSIS — I481 Persistent atrial fibrillation: Secondary | ICD-10-CM | POA: Diagnosis not present

## 2017-01-08 DIAGNOSIS — Z9861 Coronary angioplasty status: Secondary | ICD-10-CM

## 2017-01-08 DIAGNOSIS — I2510 Atherosclerotic heart disease of native coronary artery without angina pectoris: Secondary | ICD-10-CM

## 2017-01-08 NOTE — Progress Notes (Signed)
PCP: Celene Squibb, MD Primary EP: Dr [name]  Dorris Singh is a 71 y.o. male who presents today for routine electrophysiology followup.  Since last being seen in our clinic, the patient reports doing very well.  Afib is controlled.  Denies CP or ischemic symptoms.  Today, he denies symptoms of palpitations, chest pain, shortness of breath,  lower extremity edema, dizziness, presyncope, or syncope.  The patient is otherwise without complaint today.   Past Medical History:  Diagnosis Date  . Arthritis   . Atrial fibrillation ([place])   . BPH (benign prostatic hypertrophy)   . CAD (coronary artery disease)    a. s/p DES 05/2014 at [facility]  . Complication of anesthesia    BECAME COMBATIVE  . Dysrhythmia   . Femur fracture ([place])   . GERD (gastroesophageal reflux disease)    occasional TUMS  . Gout   . History of gout   . History of kidney stones   . Hypertension   . Hypothyroidism   . Persistent atrial fibrillation ([place])   . Precancerous skin lesion    Past Surgical History:  Procedure Laterality Date  . arm fracture surgery Left   . CARDIOVERSION [name] 12/09/2014   Procedure: CARDIOVERSION;  Surgeon: [name], MD;  Location: [place];  Service: Cardiovascular;  Laterality: [name];  . CARDIOVERSION [name] 05/05/2016   Procedure: CARDIOVERSION;  Surgeon: [name], MD;  Location: [place];  Service: Cardiovascular;  Laterality: [name];  . CARDIOVERSION [name] 08/22/2016   Procedure: CARDIOVERSION;  Surgeon: Lelon Perla, MD;  Location: [facility] ENDOSCOPY;  Service: Cardiovascular;  Laterality: [name];  . CATARACTS     EXCISION  . CERVICAL SPINE SURGERY    . CHOLECYSTECTOMY [name] 01/03/2013   Procedure: LAPAROSCOPIC CHOLECYSTECTOMY;  Surgeon: [name], MD;  [name];  Service: General;  Laterality: [name];  . ELECTROPHYSIOLOGIC STUDY [name] 03/15/2015   Procedure: Atrial Fibrillation Ablation;  Surgeon: [name], MD;  Location: [place] CV LAB;  Service: Cardiovascular;  Laterality:  [name];  . ELECTROPHYSIOLOGIC STUDY [name] 04/01/2016   Procedure: Atrial Fibrillation Ablation;  Surgeon: [name], MD;  Location: [place] CV LAB;  Service: Cardiovascular;  Laterality: [name];  . ERCP [name] 01/02/2013   Procedure: ENDOSCOPIC RETROGRADE CHOLANGIOPANCREATOGRAPHY (ERCP);  Surgeon: [name], MD;  [name];  Service: Endoscopy;  Laterality: [name];  . FEMUR FRACTURE SURGERY    . LIVER BIOPSY [name] 01/03/2013   Procedure: LIVER BIOPSY;  Surgeon: [name], MD;  [name];  Service: General;  Laterality: [name];  . RIGHT/LEFT HEART CATH AND CORONARY ANGIOGRAPHY [name] 07/04/2016   Procedure: Right/Left Heart Cath and Coronary Angiography;  Surgeon: Belva Crome, MD;  Location: [place] CV LAB;  Service: Cardiovascular;  Laterality: [name];  . SPHINCTEROTOMY [name] 01/02/2013   Procedure: SPHINCTEROTOMY;  Surgeon: [name], MD;  [name];  Service: Endoscopy;  Laterality: [name];  Stone Extraction  . TEE WITHOUT CARDIOVERSION [name] 08/22/2016   Procedure: TRANSESOPHAGEAL ECHOCARDIOGRAM (TEE);  Surgeon: Lelon Perla, MD;  Location: [place];  Service: Cardiovascular;  Laterality: [name];  . TOTAL KNEE ARTHROPLASTY Right 05/30/2013   Procedure: RIGHT TOTAL KNEE ARTHROPLASTY;  Surgeon: [name], MD;  [name];  Service: Orthopedics;  Laterality: Right;    ROS- all systems are reviewed and negatives except as per HPI above  Current Outpatient Prescriptions  Medication Sig Dispense Refill  . allopurinol (ZYLOPRIM) 300 MG tablet Take 300 mg by mouth  every morning.     [name] amiodarone (PACERONE) 200 MG tablet Take 1 tablet (200 mg total) by mouth daily. 90 tablet 2  . apixaban (ELIQUIS) 5 MG TABS tablet Take 1 tablet (5 mg total) by mouth 2 (two) times daily. 60 tablet 11  . diltiazem (CARDIZEM CD) 120 MG 24 hr capsule Take 1 capsule (120 mg total) by mouth daily. 180 capsule 3  . furosemide (LASIX) 40 MG tablet TAKE ONE TABLET BY MOUTH DAILY,MAY TAKE EXTRA TAB  DAILY AS NEEDED FOR WEIGHT GAIN/SWELLING 120 tablet 2  . HYDROcodone-acetaminophen (NORCO/VICODIN) 5-325 MG tablet Take 1 tablet by mouth every 6 (six) hours as needed for moderate pain.    . nitroGLYCERIN (NITROSTAT) 0.4 MG SL tablet Place 0.4 mg under the tongue every 5 (five) minutes x 3 doses as needed for chest pain.     [name] SYNTHROID 150 MCG tablet Take 150 mcg by mouth daily before breakfast.   0  . tamsulosin (FLOMAX) 0.4 MG CAPS capsule Take 0.4 mg by mouth daily.     No current facility-administered medications for this visit.     Physical Exam: Vitals:   01/08/17 1223  BP: 120/84  Pulse: 71  SpO2: 97%  Weight: 260 lb 3.2 oz (118 kg)  Height: 5\' 11"  (1.803 m)    GEN- The patient is overweight appearing, alert and oriented x 3 today.   Head- normocephalic, atraumatic Eyes-  Sclera clear, conjunctiva pink Ears- hearing intact Oropharynx- clear Lungs- Clear to ausculation bilaterally, normal work of breathing Heart- Regular rate and rhythm, no murmurs, rubs or gallops, PMI not laterally displaced GI- soft, NT, ND, + BS Extremities- no clubbing, cyanosis, or edema  EKG tracing ordered today is personally reviewed and shows sinus rhythm 71 bpm, otherwise normal ekg  Assessment and Plan:  1. Persistent atrial fibrillation Doing well s/p ablation afib is well controlled Lfts, tfts today (on amiodarone) Cbc, bmet today (on eliquis) If further afib, could consider MAZE and CABG by Dr Roxy Manns. Lifestyle modification was again discussed today  2. HTN Stable No change required today  3. Morbid obesity Body mass index is 36.29 kg/m. Lifestyle modification again encouraged  4. CAD Medical management for CAD He declines lipid management including psk9 inhibitors If symptoms worsen, would consider MAZE and CABG by Dr Roxy Manns.  Return to see Butch Penny in AF clinic every 3 months  [name] MD, [facility] 01/08/2017 12:36 PM

## 2017-01-08 NOTE — Patient Instructions (Signed)
Medication Instructions:  Your physician recommends that you continue on your current medications as directed. Please refer to the Current Medication list given to you today.   Labwork: Your physician recommends that you return for lab work today: Liver/TSH/BMP/CBC   Testing/Procedures: None ordered   Follow-Up: Your physician recommends that you schedule a follow-up appointment in: 3 months with Roderic Palau, NP   Any Other Special Instructions Will Be Listed Below (If Applicable).     If you need a refill on your cardiac medications before your next appointment, please call your pharmacy.

## 2017-01-08 NOTE — Addendum Note (Signed)
Addended by: Janan Halter F on: 01/08/2017 01:03 PM   Modules accepted: Orders

## 2017-01-09 ENCOUNTER — Telehealth: Payer: Self-pay | Admitting: Internal Medicine

## 2017-01-09 LAB — CBC WITH DIFFERENTIAL/PLATELET
BASOS: 0 %
Basophils Absolute: 0 10*3/uL (ref 0.0–0.2)
EOS (ABSOLUTE): 0.1 10*3/uL (ref 0.0–0.4)
EOS: 2 %
HEMATOCRIT: 50.5 % (ref 37.5–51.0)
Hemoglobin: 17 g/dL (ref 13.0–17.7)
Immature Grans (Abs): 0 10*3/uL (ref 0.0–0.1)
Immature Granulocytes: 0 %
LYMPHS ABS: 1.1 10*3/uL (ref 0.7–3.1)
Lymphs: 23 %
MCH: 33.2 pg — AB (ref 26.6–33.0)
MCHC: 33.7 g/dL (ref 31.5–35.7)
MCV: 99 fL — AB (ref 79–97)
MONOS ABS: 0.5 10*3/uL (ref 0.1–0.9)
Monocytes: 10 %
NEUTROS ABS: 3 10*3/uL (ref 1.4–7.0)
Neutrophils: 65 %
Platelets: 144 10*3/uL — ABNORMAL LOW (ref 150–379)
RBC: 5.12 x10E6/uL (ref 4.14–5.80)
RDW: 14.6 % (ref 12.3–15.4)
WBC: 4.7 10*3/uL (ref 3.4–10.8)

## 2017-01-09 LAB — BASIC METABOLIC PANEL
BUN/Creatinine Ratio: 17 (ref 10–24)
BUN: 20 mg/dL (ref 8–27)
CALCIUM: 9.1 mg/dL (ref 8.6–10.2)
CO2: 23 mmol/L (ref 20–29)
Chloride: 103 mmol/L (ref 96–106)
Creatinine, Ser: 1.2 mg/dL (ref 0.76–1.27)
GFR calc Af Amer: 69 mL/min/{1.73_m2} (ref >59)
GFR calc non Af Amer: 60 mL/min/{1.73_m2} (ref >59)
Glucose: 78 mg/dL (ref 65–99)
POTASSIUM: 4.7 mmol/L (ref 3.5–5.2)
Sodium: 145 mmol/L — ABNORMAL HIGH (ref 134–144)

## 2017-01-09 LAB — TSH: TSH: 0.829 u[IU]/mL (ref 0.450–4.500)

## 2017-01-09 LAB — HEPATIC FUNCTION PANEL
ALT: 29 IU/L (ref 0–44)
AST: 17 IU/L (ref 0–40)
Albumin: 4.5 g/dL (ref 3.5–4.8)
Alkaline Phosphatase: 68 IU/L (ref 39–117)
BILIRUBIN, DIRECT: 0.16 mg/dL (ref 0.00–0.40)
Bilirubin Total: 0.4 mg/dL (ref 0.0–1.2)
TOTAL PROTEIN: 6.8 g/dL (ref 6.0–8.5)

## 2017-01-09 NOTE — Telephone Encounter (Signed)
Pt voiced that he already saw results via [place]. He has no further questions.

## 2017-01-09 NOTE — Telephone Encounter (Signed)
°  New Prob  Calling to follow up on most recent blood work results. Please call.

## 2017-01-29 DIAGNOSIS — R350 Frequency of micturition: Secondary | ICD-10-CM | POA: Diagnosis not present

## 2017-01-29 DIAGNOSIS — N401 Enlarged prostate with lower urinary tract symptoms: Secondary | ICD-10-CM | POA: Diagnosis not present

## 2017-03-16 DIAGNOSIS — D485 Neoplasm of uncertain behavior of skin: Secondary | ICD-10-CM | POA: Diagnosis not present

## 2017-03-16 DIAGNOSIS — L820 Inflamed seborrheic keratosis: Secondary | ICD-10-CM | POA: Diagnosis not present

## 2017-03-16 DIAGNOSIS — L814 Other melanin hyperpigmentation: Secondary | ICD-10-CM | POA: Diagnosis not present

## 2017-03-16 DIAGNOSIS — S60562A Insect bite (nonvenomous) of left hand, initial encounter: Secondary | ICD-10-CM | POA: Diagnosis not present

## 2017-03-16 DIAGNOSIS — L298 Other pruritus: Secondary | ICD-10-CM | POA: Diagnosis not present

## 2017-03-16 DIAGNOSIS — L538 Other specified erythematous conditions: Secondary | ICD-10-CM | POA: Diagnosis not present

## 2017-03-16 DIAGNOSIS — Z789 Other specified health status: Secondary | ICD-10-CM | POA: Diagnosis not present

## 2017-03-16 DIAGNOSIS — L821 Other seborrheic keratosis: Secondary | ICD-10-CM | POA: Diagnosis not present

## 2017-03-24 DIAGNOSIS — E039 Hypothyroidism, unspecified: Secondary | ICD-10-CM | POA: Diagnosis not present

## 2017-03-24 DIAGNOSIS — E782 Mixed hyperlipidemia: Secondary | ICD-10-CM | POA: Diagnosis not present

## 2017-03-26 DIAGNOSIS — Z6837 Body mass index (BMI) 37.0-37.9, adult: Secondary | ICD-10-CM | POA: Diagnosis not present

## 2017-03-26 DIAGNOSIS — I482 Chronic atrial fibrillation: Secondary | ICD-10-CM | POA: Diagnosis not present

## 2017-03-26 DIAGNOSIS — R0602 Shortness of breath: Secondary | ICD-10-CM | POA: Diagnosis not present

## 2017-03-26 DIAGNOSIS — G250 Essential tremor: Secondary | ICD-10-CM | POA: Diagnosis not present

## 2017-04-03 ENCOUNTER — Other Ambulatory Visit (HOSPITAL_COMMUNITY): Payer: Self-pay | Admitting: Internal Medicine

## 2017-04-03 ENCOUNTER — Ambulatory Visit (HOSPITAL_COMMUNITY)
Admission: RE | Admit: 2017-04-03 | Discharge: 2017-04-03 | Disposition: A | Discharge status: Home / Self Care | Payer: Medicare HMO | Source: Ambulatory Visit | Attending: Internal Medicine | Admitting: Internal Medicine

## 2017-04-03 DIAGNOSIS — R0602 Shortness of breath: Secondary | ICD-10-CM

## 2017-04-03 DIAGNOSIS — R918 Other nonspecific abnormal finding of lung field: Secondary | ICD-10-CM | POA: Insufficient documentation

## 2017-04-09 ENCOUNTER — Ambulatory Visit (HOSPITAL_COMMUNITY)
Admission: RE | Admit: 2017-04-09 | Discharge: 2017-04-09 | Disposition: A | Discharge status: Home / Self Care | Payer: Medicare HMO | Source: Ambulatory Visit | Attending: Nurse Practitioner | Admitting: Nurse Practitioner

## 2017-04-09 ENCOUNTER — Encounter (HOSPITAL_COMMUNITY): Payer: Self-pay | Admitting: Nurse Practitioner

## 2017-04-09 VITALS — BP 120/74 | HR 67 | Ht 71.0 in | Wt 264.0 lb

## 2017-04-09 DIAGNOSIS — Z7901 Long term (current) use of anticoagulants: Secondary | ICD-10-CM | POA: Diagnosis not present

## 2017-04-09 DIAGNOSIS — Z9049 Acquired absence of other specified parts of digestive tract: Secondary | ICD-10-CM | POA: Diagnosis not present

## 2017-04-09 DIAGNOSIS — I2510 Atherosclerotic heart disease of native coronary artery without angina pectoris: Secondary | ICD-10-CM | POA: Diagnosis not present

## 2017-04-09 DIAGNOSIS — Z9889 Other specified postprocedural states: Secondary | ICD-10-CM | POA: Diagnosis not present

## 2017-04-09 DIAGNOSIS — N400 Enlarged prostate without lower urinary tract symptoms: Secondary | ICD-10-CM | POA: Diagnosis not present

## 2017-04-09 DIAGNOSIS — I5189 Other ill-defined heart diseases: Secondary | ICD-10-CM | POA: Insufficient documentation

## 2017-04-09 DIAGNOSIS — I4891 Unspecified atrial fibrillation: Secondary | ICD-10-CM | POA: Diagnosis present

## 2017-04-09 DIAGNOSIS — K219 Gastro-esophageal reflux disease without esophagitis: Secondary | ICD-10-CM | POA: Diagnosis not present

## 2017-04-09 DIAGNOSIS — Z79890 Hormone replacement therapy (postmenopausal): Secondary | ICD-10-CM | POA: Diagnosis not present

## 2017-04-09 DIAGNOSIS — R0609 Other forms of dyspnea: Secondary | ICD-10-CM | POA: Insufficient documentation

## 2017-04-09 DIAGNOSIS — Z79899 Other long term (current) drug therapy: Secondary | ICD-10-CM | POA: Diagnosis not present

## 2017-04-09 DIAGNOSIS — I10 Essential (primary) hypertension: Secondary | ICD-10-CM | POA: Insufficient documentation

## 2017-04-09 DIAGNOSIS — Z96651 Presence of right artificial knee joint: Secondary | ICD-10-CM | POA: Diagnosis not present

## 2017-04-09 DIAGNOSIS — E039 Hypothyroidism, unspecified: Secondary | ICD-10-CM | POA: Insufficient documentation

## 2017-04-09 DIAGNOSIS — I481 Persistent atrial fibrillation: Secondary | ICD-10-CM | POA: Insufficient documentation

## 2017-04-09 DIAGNOSIS — Z87891 Personal history of nicotine dependence: Secondary | ICD-10-CM | POA: Insufficient documentation

## 2017-04-09 DIAGNOSIS — M109 Gout, unspecified: Secondary | ICD-10-CM | POA: Diagnosis not present

## 2017-04-09 DIAGNOSIS — I4819 Other persistent atrial fibrillation: Secondary | ICD-10-CM

## 2017-04-09 NOTE — Progress Notes (Signed)
Patient ID: KERMAN PFOST, male   DOB: 12/01/1945, 71 y.o.   MRN: [id number]    Primary Care Physician: Dr. Merlyn Albert  Referring Physician: [name]   KHAREE LESESNE is a 71 y.o. male with a h/o afib . He had an ablation in 2016,  failed amiodarone with recent issues with recurrent afib. On sotalol in the past but had symptomatic bradycardia. He was evaluated by Dr. [name] 02/08/16 and it was felt that he would benefit from repeat ablation which he had 04/01/16. On f/u he was in afib and had been in since the week before. He was set up for a cardioversion 05/05/16, which was successful and on f/u today, he was staying in rhythm. He had a cardiac cath for persistent shortness of breath and it did show an area of the RCA that is 70% stenosed which is to be managed medically. Statins were recommended but when he saw Dr. [name], he said that he could not take statins due ot myalgias. On questioning pt he only tried one in the past and was encouraged to discuss with his new PCP which which he establishes with tomorrow to explore this again.  He had been staying in [place] but had a back steroid injection, 08/11/16 and went back into afib on Friday which has persisted. Increased his cardizem to 240 am and 120 pm and still with rvr He also had to increase lasix to 40 mg bid.Marland KitchenHe is in the clinic today saying that he does not feel well in afib and wished to be cardioverted. I explored the maze/bypass option that Dr. [name] had discussed in his note, but he states there is no way he is ready for that. He did stop apixaban for the back injection x 3 days and restarted 4/17.  This could have been the trigger.Given option to wait 3 weeks or go ahead with a TEE, he would prefer the latter.  F/u Afib clinic, s/p successful cardioversion, 08/22/16. He feels improved. LLE improved in SR, he has dropped back to lasix 40 mg daily. Continues on eliquis 5 mg bid for chadsvasc score of at least 3.  Called the clinic 1-2 weeks ago and had  gone back to afib. He decided to  try amiodarone again and was started on 200 mg bid. [name], he is in [place]. He felt poorly the first day he started amiodarone but is feeling better now.  F/u in afib clinic, 08/28/16, he is in SR on amiodarone one pill a day. He states that he feels the best now that he has in a long time. He does not feel that he has had any afib since start of the drug. Continues on eliquis.  F/u in afib clinic 12/13. He is in SR but still describes a lot of shortness of breath. He does have significant CAD, but does not see a general cardiologist. I am concerned that his shortness of breath may be anginal equivalence. He has a cardiac cath 07/04/16 and did have a  RCA stenosis that was  intermediate in severity and was  within a tortuous segment. Could be intervened upon but in the absence of anti-ischemic therapy or evidence of ischemia, does not fit appropriate use criteria for stenting. He did have a cardiologist in the [place], [place] area but no longer sees him. Original stents were placed at [facility] in 2016 but never followed up with the interventionist there. TSH/hepatic panel was checked in September and was  in normal range.  Today, he denies symptoms of palpitations, chest pain,  orthopnea, PND, lower extremity edema, dizziness, presyncope, syncope, or neurologic sequela. Positive foe exertional shortness of breath. The patient is tolerating medications without difficulties and is otherwise without complaint today.   Past Medical History:  Diagnosis Date  . Arthritis   . Atrial fibrillation ([place])   . BPH (benign prostatic hypertrophy)   . CAD (coronary artery disease)    a. s/p DES 05/2014 at [facility]  . Complication of anesthesia    BECAME COMBATIVE  . Dysrhythmia   . Femur fracture ([place])   . GERD (gastroesophageal reflux disease)    occasional TUMS  . Gout   . History of gout   . History of kidney stones   . Hypertension   . Hypothyroidism   .  Persistent atrial fibrillation ([place])   . Precancerous skin lesion    Past Surgical History:  Procedure Laterality Date  . arm fracture surgery Left   . CARDIOVERSION [name] 12/09/2014   Procedure: CARDIOVERSION;  Surgeon: [name], MD;  Location: [place];  Service: Cardiovascular;  Laterality: [name];  . CARDIOVERSION [name] 05/05/2016   Procedure: CARDIOVERSION;  Surgeon: [name], MD;  Location: [place];  Service: Cardiovascular;  Laterality: [name];  . CARDIOVERSION [name] 08/22/2016   Procedure: CARDIOVERSION;  Surgeon: Lelon Perla, MD;  Location: [facility] ENDOSCOPY;  Service: Cardiovascular;  Laterality: [name];  . CATARACTS     EXCISION  . CERVICAL SPINE SURGERY    . CHOLECYSTECTOMY [name] 01/03/2013   Procedure: LAPAROSCOPIC CHOLECYSTECTOMY;  Surgeon: [name], MD;  [name];  Service: General;  Laterality: [name];  . ELECTROPHYSIOLOGIC STUDY [name] 03/15/2015   Procedure: Atrial Fibrillation Ablation;  Surgeon: [name], MD;  [place] [facility];  Service: Cardiovascular;  Laterality: [name];  . ELECTROPHYSIOLOGIC STUDY [name] 04/01/2016   Procedure: Atrial Fibrillation Ablation;  Surgeon: [name], MD;  Location: [place] CV LAB;  Service: Cardiovascular;  Laterality: [name];  . ERCP [name] 01/02/2013   Procedure: ENDOSCOPIC RETROGRADE CHOLANGIOPANCREATOGRAPHY (ERCP);  Surgeon: [name], MD;  [name];  Service: Endoscopy;  Laterality: [name];  . FEMUR FRACTURE SURGERY    . LIVER BIOPSY [name] 01/03/2013   Procedure: LIVER BIOPSY;  Surgeon: [name], MD;  [name];  Service: General;  Laterality: [name];  . RIGHT/LEFT HEART CATH AND CORONARY ANGIOGRAPHY [name] 07/04/2016   Procedure: Right/Left Heart Cath and Coronary Angiography;  Surgeon: Belva Crome, MD;  Location: [place] CV LAB;  Service: Cardiovascular;  Laterality: [name];  . SPHINCTEROTOMY [name] 01/02/2013   Procedure: SPHINCTEROTOMY;  Surgeon: [name], MD;  [name];  Service: Endoscopy;   Laterality: [name];  Stone Extraction  . TEE WITHOUT CARDIOVERSION [name] 08/22/2016   Procedure: TRANSESOPHAGEAL ECHOCARDIOGRAM (TEE);  Surgeon: Lelon Perla, MD;  Location: [place];  Service: Cardiovascular;  Laterality: [name];  . TOTAL KNEE ARTHROPLASTY Right 05/30/2013   Procedure: RIGHT TOTAL KNEE ARTHROPLASTY;  Surgeon: [name], MD;  [name];  Service: Orthopedics;  Laterality: Right;    Current Outpatient Medications  Medication Sig Dispense Refill  . allopurinol (ZYLOPRIM) 300 MG tablet Take 300 mg by mouth every morning.     [name] amiodarone (PACERONE) 200 MG tablet Take 1 tablet (200 mg total) by mouth daily. 90 tablet 2  . apixaban (ELIQUIS) 5 MG TABS tablet Take 1 tablet (5 mg total) by mouth 2 (two) times daily. 60 tablet 11  .  diltiazem (CARDIZEM CD) 120 MG 24 hr capsule Take 1 capsule (120 mg total) by mouth daily. 180 capsule 3  . furosemide (LASIX) 40 MG tablet TAKE ONE TABLET BY MOUTH DAILY,MAY TAKE EXTRA TAB DAILY AS NEEDED FOR WEIGHT GAIN/SWELLING 120 tablet 2  . HYDROcodone-acetaminophen (NORCO/VICODIN) 5-325 MG tablet Take 1 tablet by mouth every 6 (six) hours as needed for moderate pain.    [name] SYNTHROID 150 MCG tablet Take 150 mcg by mouth daily before breakfast.   0  . tamsulosin (FLOMAX) 0.4 MG CAPS capsule Take 0.4 mg by mouth daily.    . nitroGLYCERIN (NITROSTAT) 0.4 MG SL tablet Place 0.4 mg under the tongue every 5 (five) minutes x 3 doses as needed for chest pain.      No current facility-administered medications for this encounter.     No Known Allergies  Social History   Socioeconomic History  . Marital status: Widowed    Spouse name: Not on file  . Number of children: Not on file  . Years of education: Not on file  . Highest education level: Not on file  Social Needs  . Financial resource strain: Not on file  . Food insecurity - worry: Not on file  . Food insecurity - inability: Not on file  . Transportation needs - medical: Not on file    . Transportation needs - non-medical: Not on file  Occupational History  . Not on file  Tobacco Use  . Smoking status: Former Smoker    Last attempt to quit: 05/23/1973    Years since quitting: 43.9  . Smokeless tobacco: Never Used  Substance and Sexual Activity  . Alcohol use: No    Alcohol/week: 0.0 oz  . Drug use: No  . Sexual activity: Not on file  Other Topics Concern  . Not on file  Social History Narrative  . Not on file    Family History  Problem Relation Age of Onset  . Kidney Stones Mother     ROS- All systems are reviewed and negative except as per the HPI above  Physical Exam: Vitals:   04/09/17 1019  BP: 120/74  Pulse: 67  SpO2: 93%  Weight: 264 lb (119.7 kg)  Height: 5\' 11"  (1.803 m)    GEN- The patient is well appearing, alert and oriented x 3 today.   Head- normocephalic, atraumatic Eyes-  Sclera clear, conjunctiva pink Ears- hearing intact Oropharynx- clear Neck- supple, no JVP Lymph- no cervical lymphadenopathy Lungs- Clear to ausculation bilaterally, normal work of breathing Heart- regular rate and rhythm, no murmurs, rubs or gallops, PMI not laterally displaced GI- soft, NT, ND, + BS Extremities- no clubbing, cyanosis, or edema MS- no significant deformity or atrophy Skin- no rash or lesion Psych- euthymic mood, full affect Neuro- strength and sensation are intact  EKG - SR at 74 bpm, pr int 192 ms, qrs int 92 ms, qtc 455 ms Epic records reviewed Lipid panel 07/04/16- Total chol 183, trigs, 122, HDL 41, LDL 118   Assessment and Plan:    1. Persistent symptomatic afib s/p ablation 04/01/16 Currently staying in SR but has c/o increased exertional dyspnea Continue diltiazem at 120 mg daily Continue amiodarone at 200 mg a day Continue eliquis without missed doses for chadsvasc score of at least 3  2. Acute diastolic dysfunction Stable Continue daily lasix at 40 mg qd   3. HTN Stable  4. Morbid obesity Weight loss  strongly  advised  5. CAD Worry that exertional dyspnea may  be anginal equivalent He has significant CAD and is not followed by general cardiology I would like him to establish with general cardiology and will refer him to see Dr. Tamala Julian who did his cath this past March DR. Allred has said in the past if CAD worsens/afib burden increases he may be a bypass/maze candidate  He is not ready at this time to consider He is not on optimal medical management for CAD but he has refused statins in the past, 2/2 myalgias   F/u in afib clinic in 3 months Will encourage pt to get established with Dr. Tamala Julian for management of CAD  Geroge Baseman. Gwynne Kemnitz, [facility] [address] [place], [place] [address] [phone]

## 2017-04-16 DIAGNOSIS — R0602 Shortness of breath: Secondary | ICD-10-CM | POA: Diagnosis not present

## 2017-04-16 DIAGNOSIS — I482 Chronic atrial fibrillation: Secondary | ICD-10-CM | POA: Diagnosis not present

## 2017-05-19 DIAGNOSIS — M5416 Radiculopathy, lumbar region: Secondary | ICD-10-CM | POA: Diagnosis not present

## 2017-06-17 ENCOUNTER — Ambulatory Visit: Payer: Medicare HMO | Admitting: Interventional Cardiology

## 2017-07-06 ENCOUNTER — Other Ambulatory Visit (HOSPITAL_COMMUNITY): Payer: Self-pay | Admitting: *Deleted

## 2017-07-06 MED ORDER — APIXABAN 5 MG PO TABS: 5.0000 mg | ORAL_TABLET | Freq: Two times a day (BID) | ORAL | 6 refills | Status: DC

## 2017-07-06 NOTE — Telephone Encounter (Signed)
Patient reports he took himself off diltiazem and flomax few weeks ago and has had no chest pressure since stopping. States his HR is controlled without the diltiazem.

## 2017-07-09 DIAGNOSIS — G8929 Other chronic pain: Secondary | ICD-10-CM | POA: Diagnosis not present

## 2017-07-09 DIAGNOSIS — M109 Gout, unspecified: Secondary | ICD-10-CM | POA: Diagnosis not present

## 2017-07-09 DIAGNOSIS — Z6837 Body mass index (BMI) 37.0-37.9, adult: Secondary | ICD-10-CM | POA: Diagnosis not present

## 2017-07-09 DIAGNOSIS — Z7901 Long term (current) use of anticoagulants: Secondary | ICD-10-CM | POA: Diagnosis not present

## 2017-07-09 DIAGNOSIS — I4891 Unspecified atrial fibrillation: Secondary | ICD-10-CM | POA: Diagnosis not present

## 2017-07-09 DIAGNOSIS — I509 Heart failure, unspecified: Secondary | ICD-10-CM | POA: Diagnosis not present

## 2017-07-09 DIAGNOSIS — E039 Hypothyroidism, unspecified: Secondary | ICD-10-CM | POA: Diagnosis not present

## 2017-07-09 DIAGNOSIS — I2510 Atherosclerotic heart disease of native coronary artery without angina pectoris: Secondary | ICD-10-CM | POA: Diagnosis not present

## 2017-07-09 DIAGNOSIS — Z7722 Contact with and (suspected) exposure to environmental tobacco smoke (acute) (chronic): Secondary | ICD-10-CM | POA: Diagnosis not present

## 2017-07-09 DIAGNOSIS — E669 Obesity, unspecified: Secondary | ICD-10-CM | POA: Diagnosis not present

## 2017-07-27 ENCOUNTER — Other Ambulatory Visit (HOSPITAL_COMMUNITY): Payer: Self-pay | Admitting: *Deleted

## 2017-07-27 MED ORDER — AMIODARONE HCL 200 MG PO TABS: 200.0000 mg | ORAL_TABLET | Freq: Every day | ORAL | 2 refills | Status: DC

## 2017-08-13 DIAGNOSIS — M4726 Other spondylosis with radiculopathy, lumbar region: Secondary | ICD-10-CM | POA: Diagnosis not present

## 2017-08-13 DIAGNOSIS — M5416 Radiculopathy, lumbar region: Secondary | ICD-10-CM | POA: Diagnosis not present

## 2017-09-03 ENCOUNTER — Ambulatory Visit: Payer: Medicare HMO | Admitting: Interventional Cardiology

## 2017-09-03 ENCOUNTER — Encounter: Payer: Self-pay | Admitting: Interventional Cardiology

## 2017-09-03 VITALS — BP 134/74 | HR 70 | Ht 71.0 in | Wt 258.0 lb

## 2017-09-03 DIAGNOSIS — I10 Essential (primary) hypertension: Secondary | ICD-10-CM

## 2017-09-03 DIAGNOSIS — Z7901 Long term (current) use of anticoagulants: Secondary | ICD-10-CM

## 2017-09-03 DIAGNOSIS — I4819 Other persistent atrial fibrillation: Secondary | ICD-10-CM

## 2017-09-03 DIAGNOSIS — I481 Persistent atrial fibrillation: Secondary | ICD-10-CM

## 2017-09-03 DIAGNOSIS — Z9861 Coronary angioplasty status: Secondary | ICD-10-CM

## 2017-09-03 DIAGNOSIS — Z79899 Other long term (current) drug therapy: Secondary | ICD-10-CM | POA: Diagnosis not present

## 2017-09-03 DIAGNOSIS — I2510 Atherosclerotic heart disease of native coronary artery without angina pectoris: Secondary | ICD-10-CM | POA: Diagnosis not present

## 2017-09-03 NOTE — Progress Notes (Signed)
Cardiology Office Note    Date:  09/03/2017   ID:  Wayne Ortiz, DOB 09-22-1945, MRN [id number]  PCP:  Celene Squibb, MD  Cardiologist: Sinclair Grooms, MD   Chief Complaint  Patient presents with  . Coronary Artery Disease    History of Present Illness:  Wayne Ortiz is a 72 y.o. male with history of CAD with DES 2016 [facility], nonobstructive disease on most recent angiogram, atrial fibrillation and atrial flutter with history of ablation by Dr. [name] 2016, essential hypertension, and prior electrical cardioversions.  He is on chronic anticoagulation therapy and amiodarone.  Wayne Ortiz has multivessel coronary disease.  He has had both atrial fibrillation and atrial flutter.  For the most part he has been followed by Dr. [name] and has undergone ablation.  He is now on chronic amiodarone therapy for rhythm control.  Patient states that since atrial fibrillation is been under control he feels great.  He has not had significant bleeding event on apixaban.  Coronary symptoms have always been shortness of breath.  He has the same level of shortness of breath now as he has had over the past 3 months.  Past Medical History:  Diagnosis Date  . Arthritis   . Atrial fibrillation ([place])   . BPH (benign prostatic hypertrophy)   . CAD (coronary artery disease)    a. s/p DES 05/2014 at [facility]  . Complication of anesthesia    BECAME COMBATIVE  . Dysrhythmia   . Femur fracture ([place])   . GERD (gastroesophageal reflux disease)    occasional TUMS  . Gout   . History of gout   . History of kidney stones   . Hypertension   . Hypothyroidism   . Persistent atrial fibrillation ([place])   . Precancerous skin lesion     Past Surgical History:  Procedure Laterality Date  . arm fracture surgery Left   . CARDIOVERSION [name] 12/09/2014   Procedure: CARDIOVERSION;  Surgeon: [name], MD;  Location: [place];  Service: Cardiovascular;  Laterality: [name];  . CARDIOVERSION [name]  05/05/2016   Procedure: CARDIOVERSION;  Surgeon: [name], MD;  Location: [place];  Service: Cardiovascular;  Laterality: [name];  . CARDIOVERSION [name] 08/22/2016   Procedure: CARDIOVERSION;  Surgeon: Lelon Perla, MD;  Location: [facility] ENDOSCOPY;  Service: Cardiovascular;  Laterality: [name];  . CATARACTS     EXCISION  . CERVICAL SPINE SURGERY    . CHOLECYSTECTOMY [name] 01/03/2013   Procedure: LAPAROSCOPIC CHOLECYSTECTOMY;  Surgeon: [name], MD;  [name];  Service: General;  Laterality: [name];  . ELECTROPHYSIOLOGIC STUDY [name] 03/15/2015   Procedure: Atrial Fibrillation Ablation;  Surgeon: [name], MD;  Location: [place] CV LAB;  Service: Cardiovascular;  Laterality: [name];  . ELECTROPHYSIOLOGIC STUDY [name] 04/01/2016   Procedure: Atrial Fibrillation Ablation;  Surgeon: [name], MD;  Location: [place] CV LAB;  Service: Cardiovascular;  Laterality: [name];  . ERCP [name] 01/02/2013   Procedure: ENDOSCOPIC RETROGRADE CHOLANGIOPANCREATOGRAPHY (ERCP);  Surgeon: [name], MD;  Location[name];  Service: Endoscopy;  Laterality: [name];  . FEMUR FRACTURE SURGERY    . LIVER BIOPSY [name] 01/03/2013   Procedure: LIVER BIOPSY;  Surgeon: [name], MD;  Location[name];  Service: General;  Laterality: [name];  . RIGHT/LEFT HEART CATH AND CORONARY ANGIOGRAPHY [name] 07/04/2016   Procedure: Right/Left Heart Cath and Coronary Angiography;  Surgeon: Belva Crome, MD;  Location: [place] CV LAB;  Service: Cardiovascular;  Laterality: [name];  . SPHINCTEROTOMY [name] 01/02/2013   Procedure: SPHINCTEROTOMY;  Surgeon: [name], MD;  Location: [name];  Service: Endoscopy;  Laterality: [name];  Stone Extraction  . TEE WITHOUT CARDIOVERSION [name] 08/22/2016   Procedure: TRANSESOPHAGEAL ECHOCARDIOGRAM (TEE);  Surgeon: Lelon Perla, MD;  Location: [place];  Service: Cardiovascular;  Laterality: [name];  . TOTAL KNEE ARTHROPLASTY Right 05/30/2013   Procedure: RIGHT TOTAL KNEE ARTHROPLASTY;  Surgeon:  [name], MD;  [name];  Service: Orthopedics;  Laterality: Right;    Current Medications: Outpatient Medications Prior to Visit  Medication Sig Dispense Refill  . allopurinol (ZYLOPRIM) 300 MG tablet Take 300 mg by mouth every morning.     [name] amiodarone (PACERONE) 200 MG tablet Take 1 tablet (200 mg total) by mouth daily. 90 tablet 2  . apixaban (ELIQUIS) 5 MG TABS tablet Take 1 tablet (5 mg total) by mouth 2 (two) times daily. 60 tablet 6  . furosemide (LASIX) 40 MG tablet TAKE ONE TABLET BY MOUTH DAILY,MAY TAKE EXTRA TAB DAILY AS NEEDED FOR WEIGHT GAIN/SWELLING 120 tablet 2  . HYDROcodone-acetaminophen (NORCO/VICODIN) 5-325 MG tablet Take 1 tablet by mouth every 6 (six) hours as needed for moderate pain.    [name] SYNTHROID 150 MCG tablet Take 150 mcg by mouth daily before breakfast.   0  . diltiazem (CARDIZEM CD) 120 MG 24 hr capsule Take 1 capsule (120 mg total) by mouth daily. (Patient not taking: Reported on 09/03/2017) 180 capsule 3  . nitroGLYCERIN (NITROSTAT) 0.4 MG SL tablet Place 0.4 mg under the tongue every 5 (five) minutes x 3 doses as needed for chest pain.     . tamsulosin (FLOMAX) 0.4 MG CAPS capsule Take 0.4 mg by mouth daily.     No facility-administered medications prior to visit.      Allergies:   Patient has no known allergies.   Social History   Socioeconomic History  . Marital status: Widowed    Spouse name: Not on file  . Number of children: Not on file  . Years of education: Not on file  . Highest education level: Not on file  Occupational History  . Not on file  Social Needs  . Financial resource strain: Not on file  . Food insecurity:    Worry: Not on file    Inability: Not on file  . Transportation needs:    Medical: Not on file    Non-medical: Not on file  Tobacco Use  . Smoking status: Former Smoker    Last attempt to quit: 05/23/1973    Years since quitting: 44.3  . Smokeless tobacco: Never Used  Substance and Sexual Activity  .  Alcohol use: No    Alcohol/week: 0.0 oz  . Drug use: No  . Sexual activity: Not on file  Lifestyle  . Physical activity:    Days per week: Not on file    Minutes per session: Not on file  . Stress: Not on file  Relationships  . Social connections:    Talks on phone: Not on file    Gets together: Not on file    Attends religious service: Not on file    Active member of club or organization: Not on file    Attends meetings of clubs or organizations: Not on file    Relationship status: Not on file  Other Topics Concern  . Not on file  Social History Narrative  . Not on file  Family History:  The patient's family history includes Kidney Stones in his mother.   ROS:   Please see the history of present illness.    He is happy with the way things are going.  He does not have any new complaints.  He is here today to establish for chronic longitudinal care.  He has had no shortness of breath, cough, hemoptysis, chills, fever, or musculoskeletal complaints.  Denies melena. All other systems reviewed and are negative.   PHYSICAL EXAM:   VS:  BP 134/74   Pulse 70   Ht 5\' 11"  (1.803 m)   Wt 258 lb (117 kg)   BMI 35.98 kg/m    GEN: Well nourished, well developed, in no acute distress.  Morbidly obese. HEENT: normal  Neck: no JVD, carotid bruits, or masses Cardiac: RRR; no murmurs, rubs, or gallops,no edema  Respiratory:  clear to auscultation bilaterally, normal work of breathing GI: soft, nontender, nondistended, + BS MS: no deformity or atrophy  Skin: warm and dry, no rash Neuro:  Alert and Oriented x 3, Strength and sensation are intact Psych: euthymic mood, full affect  Wt Readings from Last 3 Encounters:  09/03/17 258 lb (117 kg)  04/09/17 264 lb (119.7 kg)  01/08/17 260 lb 3.2 oz (118 kg)      Studies/Labs Reviewed:   EKG:  EKG normal sinus rhythm without evidence of prior infarction or ongoing ischemia from tracing performed in December 2018  Recent  Labs: 01/08/2017: ALT 29; BUN 20; Creatinine, Ser 1.20; Hemoglobin 17.0; Platelets 144; Potassium 4.7; Sodium 145; TSH 0.829   Lipid Panel    Component Value Date/Time   CHOL 183 06/30/2016 1115   TRIG 122 06/30/2016 1115   HDL 41 06/30/2016 1115   CHOLHDL 4.5 06/30/2016 1115   CHOLHDL 4.0 08/08/2014 0711   VLDL 21 08/08/2014 0711   LDLCALC 118 (H) 06/30/2016 1115    Additional studies/ records that were reviewed today include:  Cardiac catheterization March 2018:  Diagnostic Diagram          ASSESSMENT:    1. CAD S/P RCA and CFX DES Feb 2016   2. Persistent atrial fibrillation ([place])   3. Chronic anticoagulation-Eliquis   4. On amiodarone therapy   5. Essential hypertension      PLAN:  In order of problems listed above:  1. Circumflex and right coronary stents documented by cath most recently performed in March 2018.  He has been stable from a symptomatic standpoint since that time without angina or change in his anginal equivalent complain of exertional dyspnea.  At that time he had moderate mid right coronary disease and high-grade obstruction and a relatively small obtuse marginal that we decided to treat with medical therapy.  We will continue that approach based upon today's office visit. 2. Atrial fibrillation now resolved to sinus rhythm with rhythm control on amiodarone. 3. No bleeding complications on Eliquis. 4. TSH and hepatic panel twice yearly chest x-ray once per year.  He feels he has had recent blood work and will get Dr. Allyn Kenner to forward [place] information or draw it when he sees him in June.  Plan clinical follow-up with [place] in 6 months. 5. Adequate blood pressure control.    Medication Adjustments/Labs and Tests Ordered: Current medicines are reviewed at length with the patient today.  Concerns regarding medicines are outlined above.  Medication changes, Labs and Tests ordered today are listed in the Patient Instructions below. Patient Instructions   Medication Instructions:  Your physician recommends that you continue on your current medications as directed. Please refer to the Current Medication list given to you today.  Labwork: Please have your Primary Care Physician drawn  TSH and hepatic panel now and then again in 6 months  Testing/Procedures: None  Follow-Up: Your physician wants you to follow-up in: 6 months with Dr. Tamala Julian.  You will receive a reminder letter in the mail two months in advance. If you don't receive a letter, please call our office to schedule the follow-up appointment.   Any Other Special Instructions Will Be Listed Below (If Applicable).     If you need a refill on your cardiac medications before your next appointment, please call your pharmacy.      Signed, Sinclair Grooms, MD  09/03/2017 11:04 AM    [facility] [place], [place], [place]  [address] Phone: [phone]; Fax: [phone]

## 2017-09-03 NOTE — Patient Instructions (Signed)
Medication Instructions:  Your physician recommends that you continue on your current medications as directed. Please refer to the Current Medication list given to you today.  Labwork: Please have your Primary Care Physician drawn  TSH and hepatic panel now and then again in 6 months  Testing/Procedures: None  Follow-Up: Your physician wants you to follow-up in: 6 months with Dr. Tamala Julian.  You will receive a reminder letter in the mail two months in advance. If you don't receive a letter, please call our office to schedule the follow-up appointment.   Any Other Special Instructions Will Be Listed Below (If Applicable).     If you need a refill on your cardiac medications before your next appointment, please call your pharmacy.

## 2017-09-08 DIAGNOSIS — E782 Mixed hyperlipidemia: Secondary | ICD-10-CM | POA: Diagnosis not present

## 2017-09-10 DIAGNOSIS — I482 Chronic atrial fibrillation: Secondary | ICD-10-CM | POA: Diagnosis not present

## 2017-09-10 DIAGNOSIS — Z6836 Body mass index (BMI) 36.0-36.9, adult: Secondary | ICD-10-CM | POA: Diagnosis not present

## 2017-09-10 DIAGNOSIS — E782 Mixed hyperlipidemia: Secondary | ICD-10-CM | POA: Diagnosis not present

## 2017-09-10 DIAGNOSIS — E039 Hypothyroidism, unspecified: Secondary | ICD-10-CM | POA: Diagnosis not present

## 2017-09-10 DIAGNOSIS — N400 Enlarged prostate without lower urinary tract symptoms: Secondary | ICD-10-CM | POA: Diagnosis not present

## 2017-10-15 DIAGNOSIS — R3914 Feeling of incomplete bladder emptying: Secondary | ICD-10-CM | POA: Diagnosis not present

## 2017-10-15 DIAGNOSIS — R3912 Poor urinary stream: Secondary | ICD-10-CM | POA: Diagnosis not present

## 2017-10-15 DIAGNOSIS — R3916 Straining to void: Secondary | ICD-10-CM | POA: Diagnosis not present

## 2017-10-15 DIAGNOSIS — N401 Enlarged prostate with lower urinary tract symptoms: Secondary | ICD-10-CM | POA: Diagnosis not present

## 2017-11-19 DIAGNOSIS — G8929 Other chronic pain: Secondary | ICD-10-CM | POA: Diagnosis not present

## 2017-11-19 DIAGNOSIS — G250 Essential tremor: Secondary | ICD-10-CM | POA: Diagnosis not present

## 2017-11-19 DIAGNOSIS — M109 Gout, unspecified: Secondary | ICD-10-CM | POA: Diagnosis not present

## 2017-11-19 DIAGNOSIS — E039 Hypothyroidism, unspecified: Secondary | ICD-10-CM | POA: Diagnosis not present

## 2017-11-19 DIAGNOSIS — Z6836 Body mass index (BMI) 36.0-36.9, adult: Secondary | ICD-10-CM | POA: Diagnosis not present

## 2017-11-19 DIAGNOSIS — N400 Enlarged prostate without lower urinary tract symptoms: Secondary | ICD-10-CM | POA: Diagnosis not present

## 2017-11-19 DIAGNOSIS — E782 Mixed hyperlipidemia: Secondary | ICD-10-CM | POA: Diagnosis not present

## 2017-11-19 DIAGNOSIS — I482 Chronic atrial fibrillation: Secondary | ICD-10-CM | POA: Diagnosis not present

## 2017-11-19 DIAGNOSIS — R0602 Shortness of breath: Secondary | ICD-10-CM | POA: Diagnosis not present

## 2017-11-19 DIAGNOSIS — M545 Low back pain: Secondary | ICD-10-CM | POA: Diagnosis not present

## 2017-11-23 DIAGNOSIS — R69 Illness, unspecified: Secondary | ICD-10-CM | POA: Diagnosis not present

## 2017-12-03 DIAGNOSIS — R3912 Poor urinary stream: Secondary | ICD-10-CM | POA: Diagnosis not present

## 2017-12-03 IMAGING — MR MR LUMBAR SPINE W/O CM
4 of 5 series · 15 of 48 positions shown · non-contrast
Comparison: MRI lumbar spine 06/21/2014.

CLINICAL DATA: Chronic low back pain radiating into both legs. No
known injury. Initial encounter.

EXAM:
MRI LUMBAR SPINE WITHOUT CONTRAST
TECHNIQUE: Multiplanar, multisequence MR imaging of the lumbar spine was
performed. No intravenous contrast was administered.

[Series 3: T2 · sagittal · 4.0mm · 0.72mm/px · 4 of 15 slices shown (1 of 3)]
[im 1/15]
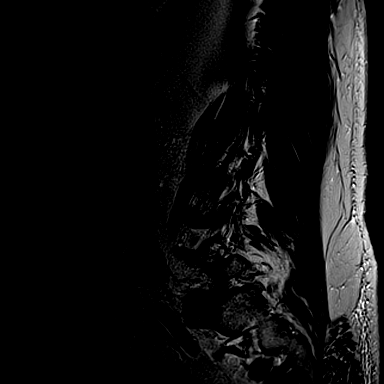
[im 5/15]
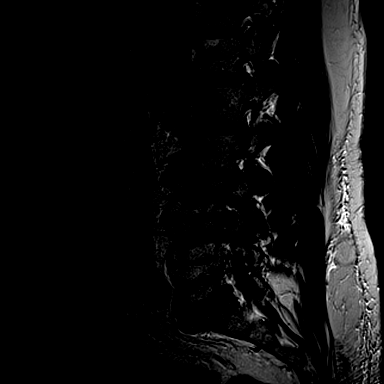
[im 10/15]
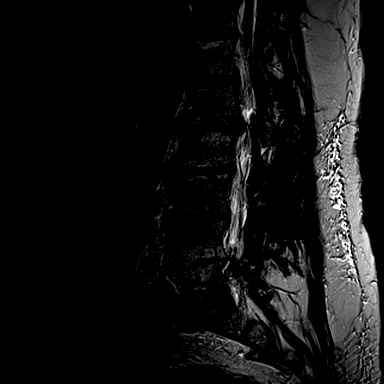
[im 15/15]
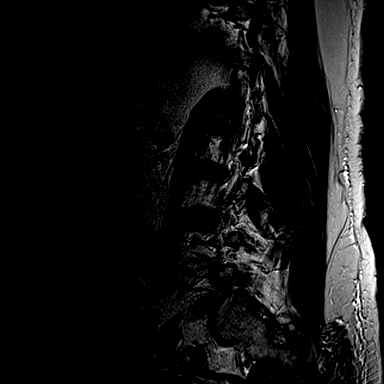

[Series 4: T1 · sagittal · 4.0mm · 0.43mm/px · 3 of 13 slices shown]
[im 1/13]
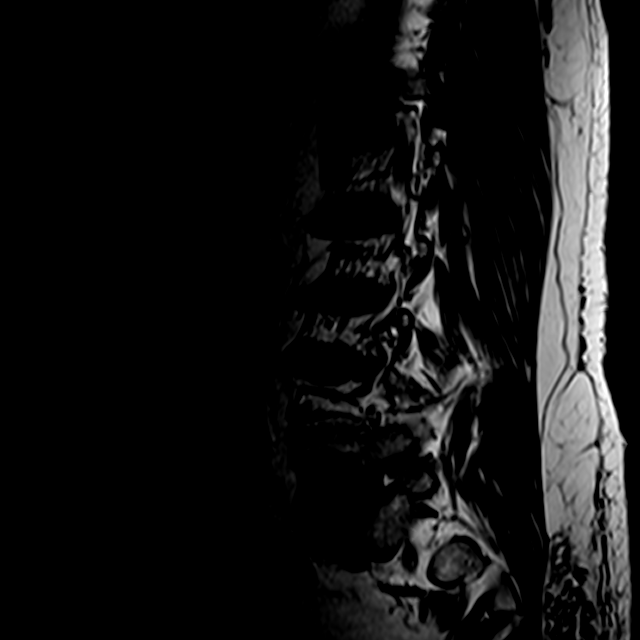
[im 9/13]
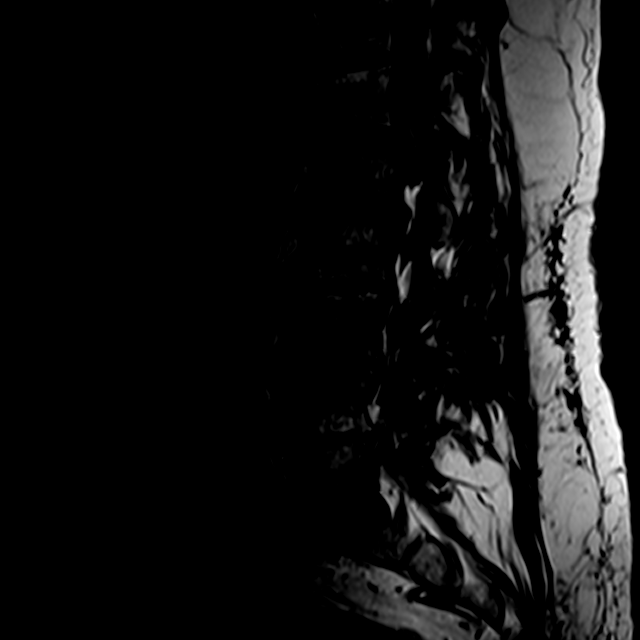
[im 13/13]
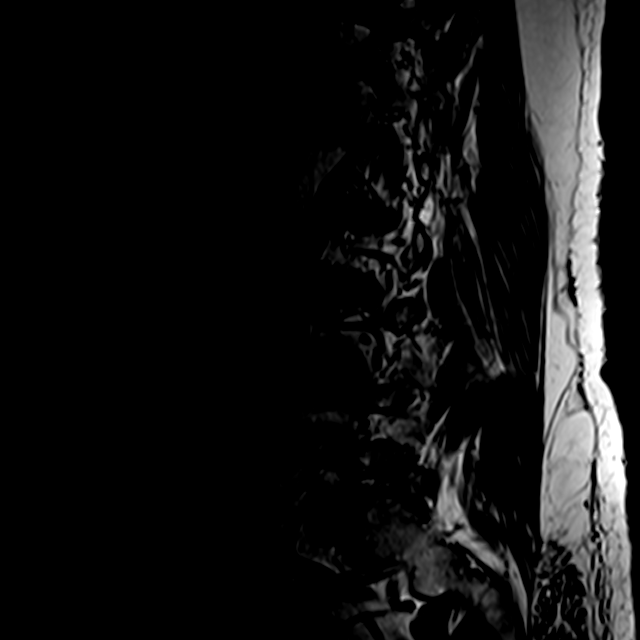

[Series 5: T2 · sagittal · 4.0mm · 0.43mm/px · 5 of 20 slices shown (2 of 3)]
[im 1/20]
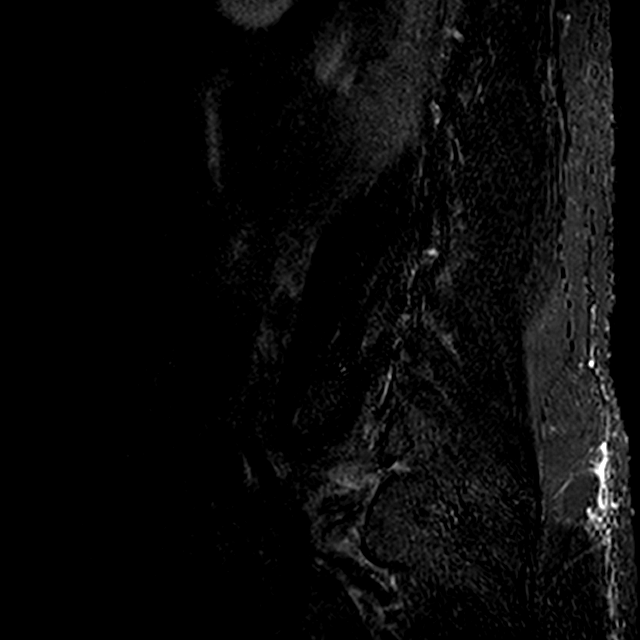
[im 4/20]
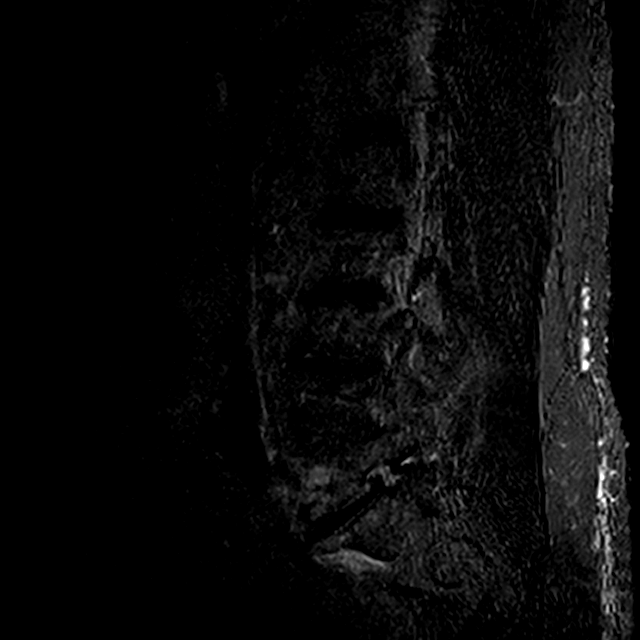
[im 8/20]
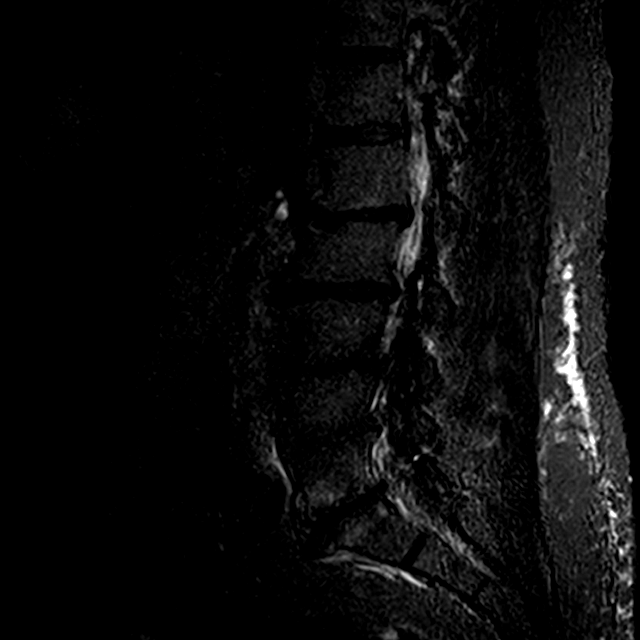
[im 12/20]
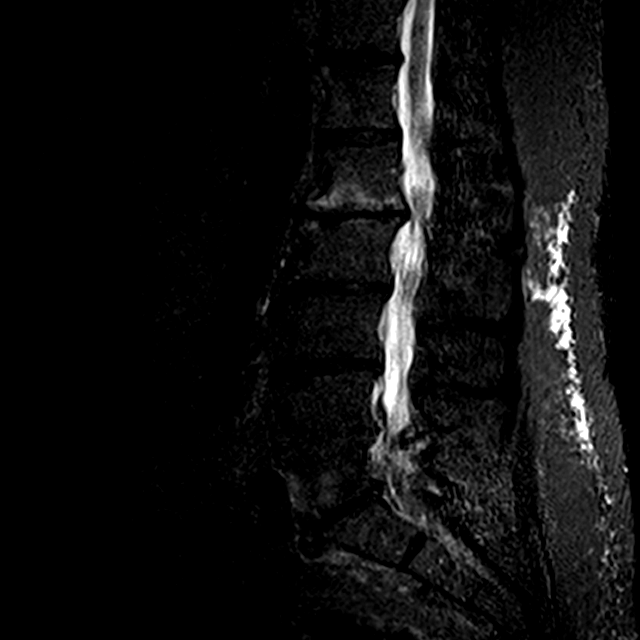
[im 20/20]
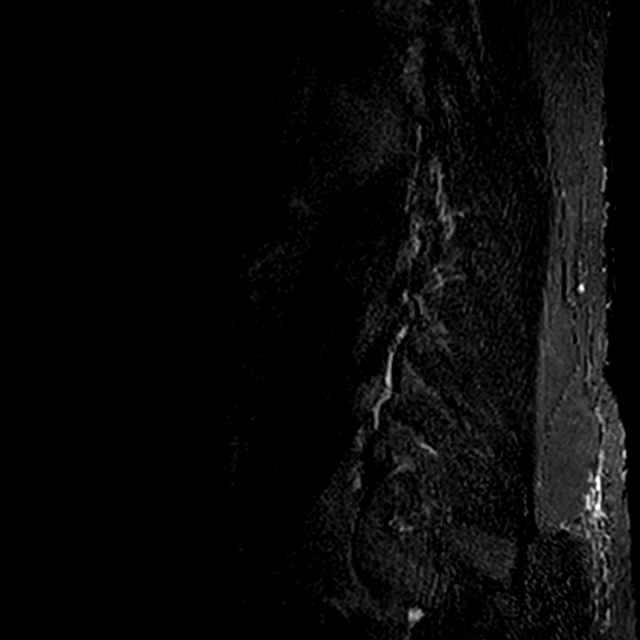

[Series 6: T2 · axial · 4.0mm · 0.31mm/px · z∈[-176,+29]mm · 3 of 53 slices shown (3 of 3)]
[im 7/53]
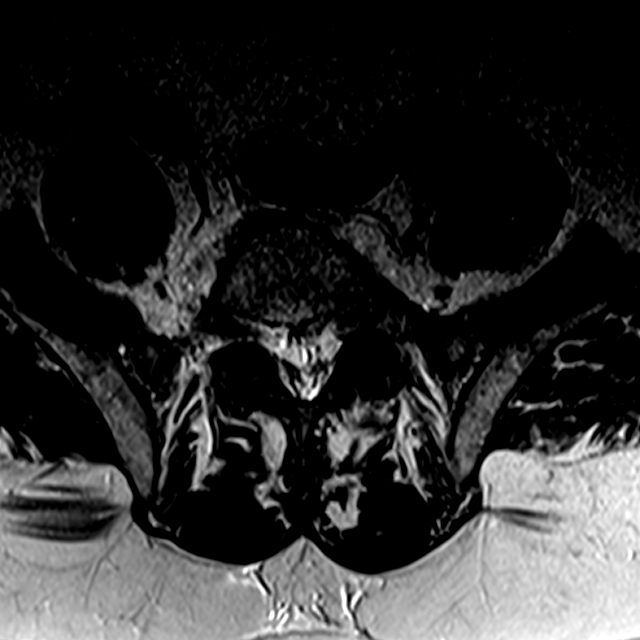
[im 27/53]
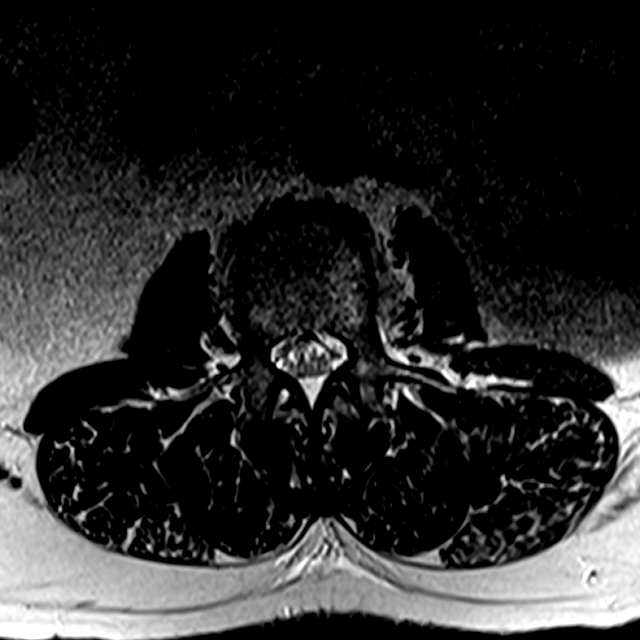
[im 46/53]
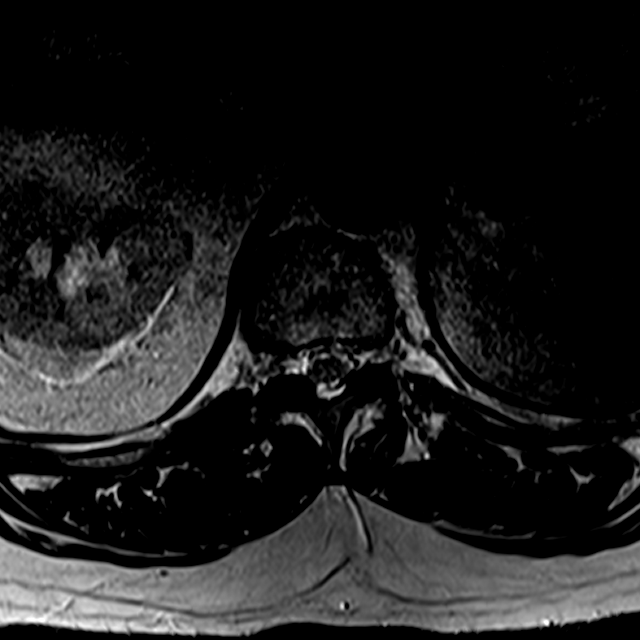

[15 of 48 positions shown; findings below may reference images not displayed]

FINDINGS: Vertebral body height is maintained. Trace multilevel retrolisthesis
and convex right scoliosis are again seen. Scattered degenerative
endplate signal change is also again seen. There is partial
autologous fusion across the L4-5 disc interspace. The conus
medullaris is normal in signal and position. Imaged intra-abdominal
contents demonstrate a cyst in the liver.

T11-12: There is a shallow central protrusion and some facet
degenerative change. The central canal and foramina are open.

T12-L1: There is facet degenerative disease and a minimal disc bulge
without stenosis.

L1-2: Shallow disc bulge with endplate spur somewhat more prominent
to the left. The central canal and foramina are open. The appearance
is unchanged.

L2-3: Shallow disc bulge with some facet degenerative disease. The
central canal and foramina are open.

L3-4: Right paravertebral disc bulge extends into the right foramen
and lateral recess. There is only mild right foraminal stenosis. The
central canal and left foramen are open. The appearance is
unchanged.

L4-5: The disc interspace is largely fused. There is endplate
spurring. Mild to moderate central canal and moderate bilateral
foraminal narrowing is present. The appearance is unchanged.

L5-S1: Bilateral L5 pars interarticularis defects without
anterolisthesis again seen. Shallow broad-based disc bulge without
central canal narrowing is seen. Epidural fat is prominent. The
foramina appear markedly narrowed.
IMPRESSION: No change in the appearance of the lumbar spine.

Autologous fusion across the L4-5 interspace. Endplate spurring at
this level results in mild to moderate central canal narrowing and
moderate bilateral foraminal narrowing.

No change in marked bilateral foraminal narrowing at L5-S1.
Bilateral L5 pars interarticularis defects without anterolisthesis
are noted.

## 2017-12-08 ENCOUNTER — Telehealth (HOSPITAL_COMMUNITY): Payer: Self-pay | Admitting: *Deleted

## 2017-12-08 DIAGNOSIS — N390 Urinary tract infection, site not specified: Secondary | ICD-10-CM | POA: Diagnosis not present

## 2017-12-08 DIAGNOSIS — N210 Calculus in bladder: Secondary | ICD-10-CM | POA: Diagnosis not present

## 2017-12-08 DIAGNOSIS — R3912 Poor urinary stream: Secondary | ICD-10-CM | POA: Diagnosis not present

## 2017-12-08 DIAGNOSIS — R3915 Urgency of urination: Secondary | ICD-10-CM | POA: Diagnosis not present

## 2017-12-08 DIAGNOSIS — N401 Enlarged prostate with lower urinary tract symptoms: Secondary | ICD-10-CM | POA: Diagnosis not present

## 2017-12-08 NOTE — Telephone Encounter (Signed)
Anderson Malta with [facility] in [place] called stating patient needs to have a bladder stone removed. Need to know if he can hold eliquis 3 days prior to procedure and how long after can he hold?

## 2017-12-08 NOTE — Telephone Encounter (Signed)
Letter faxed to [place] [facility] attention to Langley Adie, RN.

## 2017-12-08 NOTE — Telephone Encounter (Signed)
Patient with diagnosis of Afib on Eliquis for anticoagulation.    Procedure: bladder stone removal Date of procedure: pending  CHADS2-VASc score of  3 (CHF, HTN, AGE, DM2, stroke/tia x 2, CAD, AGE, male)  CrCl 15ml/min  Per office protocol, patient can hold Eliquis for 3 days prior to procedure if needed.  Would recommend resume Eliquis the day after the procedure if safe from a bleeding prospective.

## 2017-12-17 DIAGNOSIS — I10 Essential (primary) hypertension: Secondary | ICD-10-CM | POA: Diagnosis not present

## 2017-12-18 ENCOUNTER — Telehealth: Payer: Self-pay | Admitting: Interventional Cardiology

## 2017-12-18 DIAGNOSIS — Z87442 Personal history of urinary calculi: Secondary | ICD-10-CM | POA: Diagnosis not present

## 2017-12-18 DIAGNOSIS — E039 Hypothyroidism, unspecified: Secondary | ICD-10-CM | POA: Diagnosis not present

## 2017-12-18 DIAGNOSIS — R3916 Straining to void: Secondary | ICD-10-CM | POA: Diagnosis not present

## 2017-12-18 DIAGNOSIS — Z7901 Long term (current) use of anticoagulants: Secondary | ICD-10-CM | POA: Diagnosis not present

## 2017-12-18 DIAGNOSIS — I10 Essential (primary) hypertension: Secondary | ICD-10-CM | POA: Diagnosis not present

## 2017-12-18 DIAGNOSIS — R3914 Feeling of incomplete bladder emptying: Secondary | ICD-10-CM | POA: Diagnosis not present

## 2017-12-18 DIAGNOSIS — N210 Calculus in bladder: Secondary | ICD-10-CM | POA: Diagnosis not present

## 2017-12-18 DIAGNOSIS — N401 Enlarged prostate with lower urinary tract symptoms: Secondary | ICD-10-CM | POA: Diagnosis not present

## 2017-12-18 DIAGNOSIS — I4891 Unspecified atrial fibrillation: Secondary | ICD-10-CM | POA: Diagnosis not present

## 2017-12-18 DIAGNOSIS — R3912 Poor urinary stream: Secondary | ICD-10-CM | POA: Diagnosis not present

## 2017-12-18 NOTE — Telephone Encounter (Signed)
New message  Per Amy states that she needs the last office note, ekg and any other test or echo in the past 2 or 3 years. The patient is having Bladder Stone Removal Surgery on 12/25/2017.  Please fax to [phone] to Amy's Attention.

## 2017-12-18 NOTE — Telephone Encounter (Signed)
Will route to medical records 

## 2017-12-21 DIAGNOSIS — R3914 Feeling of incomplete bladder emptying: Secondary | ICD-10-CM | POA: Diagnosis not present

## 2017-12-23 ENCOUNTER — Telehealth: Payer: Self-pay | Admitting: Interventional Cardiology

## 2017-12-23 DIAGNOSIS — E782 Mixed hyperlipidemia: Secondary | ICD-10-CM | POA: Diagnosis not present

## 2017-12-23 DIAGNOSIS — E039 Hypothyroidism, unspecified: Secondary | ICD-10-CM | POA: Diagnosis not present

## 2017-12-23 DIAGNOSIS — G8929 Other chronic pain: Secondary | ICD-10-CM | POA: Diagnosis not present

## 2017-12-23 DIAGNOSIS — I482 Chronic atrial fibrillation: Secondary | ICD-10-CM | POA: Diagnosis not present

## 2017-12-23 DIAGNOSIS — M109 Gout, unspecified: Secondary | ICD-10-CM | POA: Diagnosis not present

## 2017-12-23 NOTE — Telephone Encounter (Signed)
New Message    [place] with Dr. Delphina Cahill office is calling on behalf of provider. The provider wants to know why the patient is not on a statin. Please call to discuss.

## 2017-12-23 NOTE — Telephone Encounter (Signed)
He should be on a statin. Not sure why he is not. Has he had trouble on statins?

## 2017-12-23 NOTE — Telephone Encounter (Signed)
Wayne Ortiz with Dr. Olivia Canter office. Informed her that there is nothing in the last office note or lab results stated a reason for the patient not to be on a statin. [name] verbalized understanding and will call back with any addition questions.

## 2017-12-23 NOTE — Telephone Encounter (Signed)
Pt currently not on a statin.  Do you want him on one?  I seen mention of one from heart cath in 2018 but doesn't appear he has ever been on one.

## 2017-12-24 NOTE — Telephone Encounter (Signed)
Spoke with pt and he states he tried Atorvastatin in the past and had terrible shin and leg pain.  Stopped medication on his own.  Pt does not want to be placed on another statin.  Spoke to him about importance and he still preferred no statin.  Spoke with him about PCSK9 Inhibitors.  Pt prefers to see Dr. Tamala Julian in November as scheduled and discuss at that time.  Will route to Dr. Tamala Julian to make him aware.

## 2017-12-24 NOTE — Telephone Encounter (Signed)
Left message to call back  

## 2017-12-24 NOTE — Telephone Encounter (Signed)
Let Dr. Wende Neighbors know therapy was prescribed but patient stopped due to side effects. Also, not very interested in [place] [address].

## 2017-12-24 NOTE — Telephone Encounter (Signed)
Follow up    Patient is returning call in reference to statins. Please call

## 2017-12-25 DIAGNOSIS — N401 Enlarged prostate with lower urinary tract symptoms: Secondary | ICD-10-CM | POA: Diagnosis not present

## 2017-12-25 DIAGNOSIS — N210 Calculus in bladder: Secondary | ICD-10-CM | POA: Diagnosis not present

## 2017-12-25 DIAGNOSIS — I10 Essential (primary) hypertension: Secondary | ICD-10-CM | POA: Diagnosis not present

## 2017-12-25 DIAGNOSIS — I4891 Unspecified atrial fibrillation: Secondary | ICD-10-CM | POA: Diagnosis not present

## 2017-12-25 DIAGNOSIS — R3912 Poor urinary stream: Secondary | ICD-10-CM | POA: Diagnosis not present

## 2017-12-25 DIAGNOSIS — R3914 Feeling of incomplete bladder emptying: Secondary | ICD-10-CM | POA: Diagnosis not present

## 2017-12-25 DIAGNOSIS — Z7901 Long term (current) use of anticoagulants: Secondary | ICD-10-CM | POA: Diagnosis not present

## 2017-12-25 DIAGNOSIS — R3916 Straining to void: Secondary | ICD-10-CM | POA: Diagnosis not present

## 2017-12-25 DIAGNOSIS — E039 Hypothyroidism, unspecified: Secondary | ICD-10-CM | POA: Diagnosis not present

## 2017-12-25 DIAGNOSIS — Z87442 Personal history of urinary calculi: Secondary | ICD-10-CM | POA: Diagnosis not present

## 2017-12-25 NOTE — Telephone Encounter (Signed)
Spoke with [name] at Dr. Juel Burrow office and made her aware.  [name] thanked me for the call.

## 2017-12-29 DIAGNOSIS — R69 Illness, unspecified: Secondary | ICD-10-CM | POA: Diagnosis not present

## 2018-01-06 DIAGNOSIS — M4726 Other spondylosis with radiculopathy, lumbar region: Secondary | ICD-10-CM | POA: Diagnosis not present

## 2018-01-11 DIAGNOSIS — R351 Nocturia: Secondary | ICD-10-CM | POA: Diagnosis not present

## 2018-01-11 DIAGNOSIS — R3912 Poor urinary stream: Secondary | ICD-10-CM | POA: Diagnosis not present

## 2018-01-11 DIAGNOSIS — N401 Enlarged prostate with lower urinary tract symptoms: Secondary | ICD-10-CM | POA: Diagnosis not present

## 2018-01-11 DIAGNOSIS — N210 Calculus in bladder: Secondary | ICD-10-CM | POA: Diagnosis not present

## 2018-01-20 DIAGNOSIS — R3912 Poor urinary stream: Secondary | ICD-10-CM | POA: Diagnosis not present

## 2018-02-03 ENCOUNTER — Other Ambulatory Visit (HOSPITAL_COMMUNITY): Payer: Self-pay | Admitting: Nurse Practitioner

## 2018-02-18 DIAGNOSIS — Z955 Presence of coronary angioplasty implant and graft: Secondary | ICD-10-CM | POA: Diagnosis not present

## 2018-02-18 DIAGNOSIS — I25119 Atherosclerotic heart disease of native coronary artery with unspecified angina pectoris: Secondary | ICD-10-CM | POA: Diagnosis not present

## 2018-02-18 DIAGNOSIS — I480 Paroxysmal atrial fibrillation: Secondary | ICD-10-CM | POA: Diagnosis not present

## 2018-03-01 ENCOUNTER — Ambulatory Visit: Payer: Medicare HMO | Admitting: Interventional Cardiology

## 2018-03-05 DIAGNOSIS — I25119 Atherosclerotic heart disease of native coronary artery with unspecified angina pectoris: Secondary | ICD-10-CM | POA: Diagnosis not present

## 2018-03-29 DIAGNOSIS — L02821 Furuncle of head [any part, except face]: Secondary | ICD-10-CM | POA: Diagnosis not present

## 2018-03-29 DIAGNOSIS — L918 Other hypertrophic disorders of the skin: Secondary | ICD-10-CM | POA: Diagnosis not present

## 2018-03-29 DIAGNOSIS — Z85828 Personal history of other malignant neoplasm of skin: Secondary | ICD-10-CM | POA: Diagnosis not present

## 2018-03-29 DIAGNOSIS — Z08 Encounter for follow-up examination after completed treatment for malignant neoplasm: Secondary | ICD-10-CM | POA: Diagnosis not present

## 2018-03-29 DIAGNOSIS — L821 Other seborrheic keratosis: Secondary | ICD-10-CM | POA: Diagnosis not present

## 2018-03-29 DIAGNOSIS — L814 Other melanin hyperpigmentation: Secondary | ICD-10-CM | POA: Diagnosis not present

## 2018-03-29 DIAGNOSIS — Z1283 Encounter for screening for malignant neoplasm of skin: Secondary | ICD-10-CM | POA: Diagnosis not present

## 2018-04-13 ENCOUNTER — Other Ambulatory Visit (HOSPITAL_COMMUNITY): Payer: Self-pay | Admitting: Nurse Practitioner

## 2018-04-13 NOTE — Telephone Encounter (Signed)
This is a A-Fib clinic pt 

## 2018-05-12 DIAGNOSIS — M4726 Other spondylosis with radiculopathy, lumbar region: Secondary | ICD-10-CM | POA: Diagnosis not present

## 2018-05-27 DIAGNOSIS — G4733 Obstructive sleep apnea (adult) (pediatric): Secondary | ICD-10-CM | POA: Diagnosis not present

## 2018-05-27 DIAGNOSIS — R002 Palpitations: Secondary | ICD-10-CM | POA: Diagnosis not present

## 2018-05-27 DIAGNOSIS — I5032 Chronic diastolic (congestive) heart failure: Secondary | ICD-10-CM | POA: Diagnosis not present

## 2018-05-27 DIAGNOSIS — I110 Hypertensive heart disease with heart failure: Secondary | ICD-10-CM | POA: Diagnosis not present

## 2018-05-27 DIAGNOSIS — I25119 Atherosclerotic heart disease of native coronary artery with unspecified angina pectoris: Secondary | ICD-10-CM | POA: Diagnosis not present

## 2018-05-27 DIAGNOSIS — R0609 Other forms of dyspnea: Secondary | ICD-10-CM | POA: Diagnosis not present

## 2018-05-27 DIAGNOSIS — I480 Paroxysmal atrial fibrillation: Secondary | ICD-10-CM | POA: Diagnosis not present

## 2018-05-27 DIAGNOSIS — Z5181 Encounter for therapeutic drug level monitoring: Secondary | ICD-10-CM | POA: Diagnosis not present

## 2018-05-27 DIAGNOSIS — I10 Essential (primary) hypertension: Secondary | ICD-10-CM | POA: Diagnosis not present

## 2018-05-27 DIAGNOSIS — I5030 Unspecified diastolic (congestive) heart failure: Secondary | ICD-10-CM | POA: Diagnosis not present

## 2018-05-27 DIAGNOSIS — Z87891 Personal history of nicotine dependence: Secondary | ICD-10-CM | POA: Diagnosis not present

## 2018-05-27 DIAGNOSIS — E785 Hyperlipidemia, unspecified: Secondary | ICD-10-CM | POA: Diagnosis not present

## 2018-05-27 DIAGNOSIS — Z955 Presence of coronary angioplasty implant and graft: Secondary | ICD-10-CM | POA: Diagnosis not present

## 2018-06-28 DIAGNOSIS — E039 Hypothyroidism, unspecified: Secondary | ICD-10-CM | POA: Diagnosis not present

## 2018-06-28 DIAGNOSIS — M109 Gout, unspecified: Secondary | ICD-10-CM | POA: Diagnosis not present

## 2018-06-28 DIAGNOSIS — E782 Mixed hyperlipidemia: Secondary | ICD-10-CM | POA: Diagnosis not present

## 2018-07-02 DIAGNOSIS — I480 Paroxysmal atrial fibrillation: Secondary | ICD-10-CM | POA: Diagnosis not present

## 2018-07-06 DIAGNOSIS — M109 Gout, unspecified: Secondary | ICD-10-CM | POA: Diagnosis not present

## 2018-07-06 DIAGNOSIS — N400 Enlarged prostate without lower urinary tract symptoms: Secondary | ICD-10-CM | POA: Diagnosis not present

## 2018-07-06 DIAGNOSIS — E782 Mixed hyperlipidemia: Secondary | ICD-10-CM | POA: Diagnosis not present

## 2018-07-06 DIAGNOSIS — E039 Hypothyroidism, unspecified: Secondary | ICD-10-CM | POA: Diagnosis not present

## 2018-07-06 DIAGNOSIS — R0602 Shortness of breath: Secondary | ICD-10-CM | POA: Diagnosis not present

## 2018-07-06 DIAGNOSIS — I482 Chronic atrial fibrillation, unspecified: Secondary | ICD-10-CM | POA: Diagnosis not present

## 2018-07-06 DIAGNOSIS — D696 Thrombocytopenia, unspecified: Secondary | ICD-10-CM | POA: Diagnosis not present

## 2018-07-06 DIAGNOSIS — G8929 Other chronic pain: Secondary | ICD-10-CM | POA: Diagnosis not present

## 2018-07-06 DIAGNOSIS — G9009 Other idiopathic peripheral autonomic neuropathy: Secondary | ICD-10-CM | POA: Diagnosis not present

## 2018-08-10 DIAGNOSIS — M4726 Other spondylosis with radiculopathy, lumbar region: Secondary | ICD-10-CM | POA: Diagnosis not present

## 2018-08-24 DIAGNOSIS — Z0000 Encounter for general adult medical examination without abnormal findings: Secondary | ICD-10-CM | POA: Diagnosis not present

## 2018-09-02 DIAGNOSIS — I25119 Atherosclerotic heart disease of native coronary artery with unspecified angina pectoris: Secondary | ICD-10-CM | POA: Diagnosis not present

## 2018-10-04 ENCOUNTER — Telehealth: Payer: Self-pay | Admitting: *Deleted

## 2018-10-04 ENCOUNTER — Other Ambulatory Visit (HOSPITAL_COMMUNITY): Payer: Self-pay | Admitting: Interventional Cardiology

## 2018-10-04 NOTE — Telephone Encounter (Addendum)
Prescription refill request for eliquis received. Called pt and made him aware that he is due for a f/u appointment with Dr. Tamala Julian . Pt stated he no longer comes here and that he is followed by [facility]. Instructed pt to call his new physcian to refill his Eliquis.

## 2018-11-09 DIAGNOSIS — M4726 Other spondylosis with radiculopathy, lumbar region: Secondary | ICD-10-CM | POA: Diagnosis not present

## 2019-01-07 DIAGNOSIS — I480 Paroxysmal atrial fibrillation: Secondary | ICD-10-CM | POA: Diagnosis not present

## 2019-01-11 DIAGNOSIS — E782 Mixed hyperlipidemia: Secondary | ICD-10-CM | POA: Diagnosis not present

## 2019-01-11 DIAGNOSIS — E039 Hypothyroidism, unspecified: Secondary | ICD-10-CM | POA: Diagnosis not present

## 2019-01-11 DIAGNOSIS — N400 Enlarged prostate without lower urinary tract symptoms: Secondary | ICD-10-CM | POA: Diagnosis not present

## 2019-01-12 DIAGNOSIS — R69 Illness, unspecified: Secondary | ICD-10-CM | POA: Diagnosis not present

## 2019-01-18 DIAGNOSIS — M109 Gout, unspecified: Secondary | ICD-10-CM | POA: Diagnosis not present

## 2019-01-18 DIAGNOSIS — I482 Chronic atrial fibrillation, unspecified: Secondary | ICD-10-CM | POA: Diagnosis not present

## 2019-01-18 DIAGNOSIS — G250 Essential tremor: Secondary | ICD-10-CM | POA: Diagnosis not present

## 2019-01-18 DIAGNOSIS — Z96651 Presence of right artificial knee joint: Secondary | ICD-10-CM | POA: Diagnosis not present

## 2019-01-18 DIAGNOSIS — Z6836 Body mass index (BMI) 36.0-36.9, adult: Secondary | ICD-10-CM | POA: Diagnosis not present

## 2019-01-18 DIAGNOSIS — E782 Mixed hyperlipidemia: Secondary | ICD-10-CM | POA: Diagnosis not present

## 2019-01-18 DIAGNOSIS — M545 Low back pain: Secondary | ICD-10-CM | POA: Diagnosis not present

## 2019-01-18 DIAGNOSIS — D696 Thrombocytopenia, unspecified: Secondary | ICD-10-CM | POA: Diagnosis not present

## 2019-01-18 DIAGNOSIS — G8929 Other chronic pain: Secondary | ICD-10-CM | POA: Diagnosis not present

## 2019-01-18 DIAGNOSIS — R0602 Shortness of breath: Secondary | ICD-10-CM | POA: Diagnosis not present

## 2019-01-18 DIAGNOSIS — M25562 Pain in left knee: Secondary | ICD-10-CM | POA: Diagnosis not present

## 2019-01-18 DIAGNOSIS — N400 Enlarged prostate without lower urinary tract symptoms: Secondary | ICD-10-CM | POA: Diagnosis not present

## 2019-01-18 DIAGNOSIS — Z0000 Encounter for general adult medical examination without abnormal findings: Secondary | ICD-10-CM | POA: Diagnosis not present

## 2019-01-18 DIAGNOSIS — E039 Hypothyroidism, unspecified: Secondary | ICD-10-CM | POA: Diagnosis not present

## 2019-01-18 DIAGNOSIS — G9009 Other idiopathic peripheral autonomic neuropathy: Secondary | ICD-10-CM | POA: Diagnosis not present

## 2019-02-06 ENCOUNTER — Encounter (HOSPITAL_COMMUNITY): Payer: Self-pay | Admitting: Emergency Medicine

## 2019-02-06 ENCOUNTER — Emergency Department (HOSPITAL_COMMUNITY)
Admission: EM | Admit: 2019-02-06 | Discharge: 2019-02-06 | Disposition: A | Discharge status: Home / Self Care | Payer: Medicare HMO | Attending: Emergency Medicine | Admitting: Emergency Medicine

## 2019-02-06 ENCOUNTER — Other Ambulatory Visit: Payer: Self-pay

## 2019-02-06 ENCOUNTER — Emergency Department (HOSPITAL_COMMUNITY): Payer: Medicare HMO

## 2019-02-06 DIAGNOSIS — Z20828 Contact with and (suspected) exposure to other viral communicable diseases: Secondary | ICD-10-CM | POA: Insufficient documentation

## 2019-02-06 DIAGNOSIS — I2510 Atherosclerotic heart disease of native coronary artery without angina pectoris: Secondary | ICD-10-CM | POA: Diagnosis not present

## 2019-02-06 DIAGNOSIS — E039 Hypothyroidism, unspecified: Secondary | ICD-10-CM | POA: Diagnosis not present

## 2019-02-06 DIAGNOSIS — Z79899 Other long term (current) drug therapy: Secondary | ICD-10-CM | POA: Insufficient documentation

## 2019-02-06 DIAGNOSIS — Z7901 Long term (current) use of anticoagulants: Secondary | ICD-10-CM | POA: Diagnosis not present

## 2019-02-06 DIAGNOSIS — R509 Fever, unspecified: Secondary | ICD-10-CM

## 2019-02-06 DIAGNOSIS — I10 Essential (primary) hypertension: Secondary | ICD-10-CM | POA: Insufficient documentation

## 2019-02-06 DIAGNOSIS — Z87891 Personal history of nicotine dependence: Secondary | ICD-10-CM | POA: Insufficient documentation

## 2019-02-06 DIAGNOSIS — R109 Unspecified abdominal pain: Secondary | ICD-10-CM | POA: Diagnosis not present

## 2019-02-06 DIAGNOSIS — R000 Tachycardia, unspecified: Secondary | ICD-10-CM | POA: Diagnosis not present

## 2019-02-06 HISTORY — DX: Disorder of kidney and ureter, unspecified: N28.9

## 2019-02-06 LAB — CBC WITH DIFFERENTIAL/PLATELET
Abs Immature Granulocytes: 0.03 10*3/uL (ref 0.00–0.07)
Basophils Absolute: 0 10*3/uL (ref 0.0–0.1)
Basophils Relative: 0 %
Eosinophils Absolute: 0.2 10*3/uL (ref 0.0–0.5)
Eosinophils Relative: 2 %
HCT: 52.9 % — ABNORMAL HIGH (ref 39.0–52.0)
Hemoglobin: 17.5 g/dL — ABNORMAL HIGH (ref 13.0–17.0)
Immature Granulocytes: 0 %
Lymphocytes Relative: 8 %
Lymphs Abs: 1 10*3/uL (ref 0.7–4.0)
MCH: 33.5 pg (ref 26.0–34.0)
MCHC: 33.1 g/dL (ref 30.0–36.0)
MCV: 101.1 fL — ABNORMAL HIGH (ref 80.0–100.0)
Monocytes Absolute: 1.5 10*3/uL — ABNORMAL HIGH (ref 0.1–1.0)
Monocytes Relative: 13 %
Neutro Abs: 8.7 10*3/uL — ABNORMAL HIGH (ref 1.7–7.7)
Neutrophils Relative %: 77 %
Platelets: 143 10*3/uL — ABNORMAL LOW (ref 150–400)
RBC: 5.23 MIL/uL (ref 4.22–5.81)
RDW: 13.6 % (ref 11.5–15.5)
WBC: 11.4 10*3/uL — ABNORMAL HIGH (ref 4.0–10.5)
nRBC: 0 % (ref 0.0–0.2)

## 2019-02-06 LAB — COMPREHENSIVE METABOLIC PANEL
ALT: 25 U/L (ref 0–44)
AST: 17 U/L (ref 15–41)
Albumin: 4.5 g/dL (ref 3.5–5.0)
Alkaline Phosphatase: 63 U/L (ref 38–126)
Anion gap: 13 (ref 5–15)
BUN: 18 mg/dL (ref 8–23)
CO2: 22 mmol/L (ref 22–32)
Calcium: 9 mg/dL (ref 8.9–10.3)
Chloride: 100 mmol/L (ref 98–111)
Creatinine, Ser: 1.12 mg/dL (ref 0.61–1.24)
GFR calc Af Amer: >60 mL/min (ref >60)
GFR calc non Af Amer: >60 mL/min (ref >60)
Glucose, Bld: 135 mg/dL — ABNORMAL HIGH (ref 70–99)
Potassium: 4 mmol/L (ref 3.5–5.1)
Sodium: 135 mmol/L (ref 135–145)
Total Bilirubin: 1.8 mg/dL — ABNORMAL HIGH (ref 0.3–1.2)
Total Protein: 8 g/dL (ref 6.5–8.1)

## 2019-02-06 LAB — URINALYSIS, ROUTINE W REFLEX MICROSCOPIC
Bacteria, UA: NONE SEEN
Bilirubin Urine: NEGATIVE
Glucose, UA: NEGATIVE mg/dL
Hgb urine dipstick: NEGATIVE
Ketones, ur: NEGATIVE mg/dL
Leukocytes,Ua: NEGATIVE
Nitrite: NEGATIVE
Protein, ur: 30 mg/dL — AB
Specific Gravity, Urine: 1.021 (ref 1.005–1.030)
pH: 5 (ref 5.0–8.0)

## 2019-02-06 LAB — LACTIC ACID, PLASMA
Lactic Acid, Venous: 1.8 mmol/L (ref 0.5–1.9)
Lactic Acid, Venous: 1.3 mmol/L (ref 0.5–1.9)

## 2019-02-06 MED ORDER — CIPROFLOXACIN HCL 250 MG PO TABS
500.0000 mg | ORAL_TABLET | Freq: Once | ORAL | Status: AC
  Administered 2019-02-06: 500 mg via ORAL
  Filled 2019-02-06: qty 2

## 2019-02-06 MED ORDER — HYDROCODONE-ACETAMINOPHEN 5-325 MG PO TABS
1.0000 | ORAL_TABLET | Freq: Once | ORAL | Status: AC
  Administered 2019-02-06: 1 via ORAL
  Filled 2019-02-06: qty 1

## 2019-02-06 MED ORDER — ACETAMINOPHEN 325 MG PO TABS
650.0000 mg | ORAL_TABLET | Freq: Once | ORAL | Status: AC
  Administered 2019-02-06: 650 mg via ORAL
  Filled 2019-02-06: qty 2

## 2019-02-06 MED ORDER — CIPROFLOXACIN HCL 500 MG PO TABS: 500.0000 mg | ORAL_TABLET | Freq: Two times a day (BID) | ORAL | 0 refills | Status: DC

## 2019-02-06 NOTE — Discharge Instructions (Signed)
As discussed, it is important that you monitor your condition carefully at home, take your antibiotics as prescribed. If you receive a phone call stating that your urine culture is normal, you may stop the antibiotics at that time.  Additionally, a coronavirus test has been sent, you will be made aware of abnormal findings.  Please follow-up with your physician as needed.

## 2019-02-06 NOTE — ED Provider Notes (Signed)
[facility] EMERGENCY DEPARTMENT Provider Note   CSN: [id number] Arrival date & time: 02/06/19  1437     History   Chief Complaint Chief Complaint  Patient presents with  . Flank Pain    HPI Wayne Ortiz is a 73 y.o. male.     HPI Elderly male with multiple medical problems including chronic pain, prior kidney stone, prior kidney infection now presents with right-sided flank pain, nausea. He notes that he typically has some degree of pain, but over the past 2 days has developed new degree of pain in the right flank, with unclear radiation. No obvious new dysuria, no obvious new hematuria, no vomiting, though he is nauseous, anorexic. Patient has a urologist in [place], has not yet seen that practitioner for this illness. Patient takes Norco at home, without appreciable change in his pain. Past Medical History:  Diagnosis Date  . Arthritis   . Atrial fibrillation ([place])   . BPH (benign prostatic hypertrophy)   . CAD (coronary artery disease)    a. s/p DES 05/2014 at [facility]  . Complication of anesthesia    BECAME COMBATIVE  . Dysrhythmia   . Femur fracture ([place])   . GERD (gastroesophageal reflux disease)    occasional TUMS  . Gout   . History of gout   . History of kidney stones   . Hypertension   . Hypothyroidism   . Persistent atrial fibrillation ([place])   . Precancerous skin lesion   . Renal disorder     Patient Active Problem List   Diagnosis Date Noted  . A-fib ([place]) 03/15/2015  . Essential hypertension 12/09/2014  . Morbid obesity-BMI 37 12/09/2014  . Hypothyroidism 12/09/2014  . Dyspnea 12/09/2014  . Chronic anticoagulation-Eliquis 12/09/2014  . Persistent atrial fibrillation ([place]) 12/07/2014  . Chest pain 09/13/2014  . Palpitations 09/13/2014  . CAD S/P RCA and CFX DES Feb 2016 09/13/2014  . Coronary artery abnormality 06/22/2014  . Presence of stent in right coronary artery 06/22/2014  . Presence of coronary angioplasty implant and graft 06/22/2014   . Abnormal cardiovascular function study 06/13/2014  . Postoperative stiffness of total knee replacement ([place]) 06/21/2013  . Pain in right knee 06/21/2013  . Difficulty in walking(719.7) 06/21/2013  . S/P right TKA 05/30/2013  . Abdominal pain, right upper quadrant 12/31/2012  . Abnormal transaminases 12/31/2012  . Thrombocytopenia, unspecified ([place]) 12/31/2012    Past Surgical History:  Procedure Laterality Date  . arm fracture surgery Left   . CARDIOVERSION [name] 12/09/2014   Procedure: CARDIOVERSION;  Surgeon: [name], MD;  Location: [facility];  Service: Cardiovascular;  Laterality: [name];  . CARDIOVERSION [name] 05/05/2016   Procedure: CARDIOVERSION;  Surgeon: [name], MD;  Location: [place];  Service: Cardiovascular;  Laterality: [name];  . CARDIOVERSION [name] 08/22/2016   Procedure: CARDIOVERSION;  Surgeon: Lelon Perla, MD;  Location: [facility] ENDOSCOPY;  Service: Cardiovascular;  Laterality: [name];  . CATARACTS     EXCISION  . CERVICAL SPINE SURGERY    . CHOLECYSTECTOMY [name] 01/03/2013   Procedure: LAPAROSCOPIC CHOLECYSTECTOMY;  Surgeon: [name], MD;  Location: [name];  Service: General;  Laterality: [name];  . ELECTROPHYSIOLOGIC STUDY [name] 03/15/2015   Procedure: Atrial Fibrillation Ablation;  Surgeon: [name], MD;  Location: [place] CV LAB;  Service: Cardiovascular;  Laterality: [name];  . ELECTROPHYSIOLOGIC STUDY [name] 04/01/2016   Procedure: Atrial Fibrillation Ablation;  Surgeon: [name], MD;  Location: [place] CV LAB;  Service: Cardiovascular;  Laterality: [name];  .  ERCP [name] 01/02/2013   Procedure: ENDOSCOPIC RETROGRADE CHOLANGIOPANCREATOGRAPHY (ERCP);  Surgeon: [name], MD;  Location: [name];  Service: Endoscopy;  Laterality: [name];  . FEMUR FRACTURE SURGERY    . LIVER BIOPSY [name] 01/03/2013   Procedure: LIVER BIOPSY;  Surgeon: [name], MD;  Location: [name];  Service: General;  Laterality: [name];  . RIGHT/LEFT HEART CATH AND CORONARY ANGIOGRAPHY [name]  07/04/2016   Procedure: Right/Left Heart Cath and Coronary Angiography;  Surgeon: Belva Crome, MD;  Location: [place] CV LAB;  Service: Cardiovascular;  Laterality: [name];  . SPHINCTEROTOMY [name] 01/02/2013   Procedure: SPHINCTEROTOMY;  Surgeon: [name], MD;  Location: [name];  Service: Endoscopy;  Laterality: [name];  Stone Extraction  . TEE WITHOUT CARDIOVERSION [name] 08/22/2016   Procedure: TRANSESOPHAGEAL ECHOCARDIOGRAM (TEE);  Surgeon: Lelon Perla, MD;  Location: [place];  Service: Cardiovascular;  Laterality: [name];  . TOTAL KNEE ARTHROPLASTY Right 05/30/2013   Procedure: RIGHT TOTAL KNEE ARTHROPLASTY;  Surgeon: [name], MD;  Location: [name];  Service: Orthopedics;  Laterality: Right;        Home Medications    Prior to Admission medications   Medication Sig Start Date End Date Taking? Authorizing Provider  allopurinol (ZYLOPRIM) 300 MG tablet Take 300 mg by mouth every morning.     [provider]  amiodarone (PACERONE) 200 MG tablet TAKE 1 TABLET BY MOUTH EVERY DAY 04/13/18   Sherran Needs, NP  ELIQUIS 5 MG TABS tablet TAKE 1 TABLET BY MOUTH TWICE A DAY 02/03/18   Belva Crome, MD  furosemide (LASIX) 40 MG tablet TAKE ONE TABLET BY MOUTH DAILY,MAY TAKE EXTRA TAB DAILY AS NEEDED FOR WEIGHT GAIN/SWELLING 11/17/16   Sherran Needs, NP  HYDROcodone-acetaminophen (NORCO/VICODIN) 5-325 MG tablet Take 1 tablet by mouth every 6 (six) hours as needed for moderate pain.    [provider]  SYNTHROID 150 MCG tablet Take 150 mcg by mouth daily before breakfast.  11/03/14   [provider]    Family History Family History  Problem Relation Age of Onset  . Kidney Stones Mother     Social History Social History   Tobacco Use  . Smoking status: Former Smoker    Quit date: 05/23/1973    Years since quitting: 45.7  . Smokeless tobacco: Never Used  Substance Use Topics  . Alcohol use: No    Alcohol/week: 0.0 standard drinks    Frequency: Never   . Drug use: No     Allergies   Patient has no known allergies.   Review of Systems Review of Systems  Constitutional:       Per HPI, otherwise negative  HENT:       Per HPI, otherwise negative  Respiratory:       Per HPI, otherwise negative  Cardiovascular:       Per HPI, otherwise negative  Gastrointestinal: Negative for vomiting.  Endocrine:       Negative aside from HPI  Genitourinary:       Neg aside from HPI   Musculoskeletal:       Per HPI, otherwise negative  Skin: Negative.   Neurological: Negative for syncope.     Physical Exam Updated Vital Signs BP 119/70 (BP Location: Right Arm)   Pulse (!) 125   Temp 100.3 F (37.9 C) (Oral)   Resp 18   Ht 5\' 11"  (1.803 m)   Wt 113.4 kg   SpO2 97%   BMI 34.87  kg/m   Physical Exam Vitals signs and nursing note reviewed.  Constitutional:      General: He is not in acute distress.    Appearance: He is well-developed. He is obese.  HENT:     Head: Normocephalic and atraumatic.  Eyes:     Conjunctiva/sclera: Conjunctivae normal.  Cardiovascular:     Rate and Rhythm: Regular rhythm. Tachycardia present.  Pulmonary:     Effort: Pulmonary effort is normal. No respiratory distress.     Breath sounds: No stridor.  Abdominal:     General: There is no distension.     Tenderness: There is no abdominal tenderness. There is no guarding.  Skin:    General: Skin is warm and dry.  Neurological:     Mental Status: He is alert and oriented to person, place, and time.      ED Treatments / Results  Labs (all labs ordered are listed, but only abnormal results are displayed) Labs Reviewed  COMPREHENSIVE METABOLIC PANEL - Abnormal; Notable for the following components:      Result Value   Glucose, Bld 135 (*)    Total Bilirubin 1.8 (*)    All other components within normal limits  CBC WITH DIFFERENTIAL/PLATELET - Abnormal; Notable for the following components:   WBC 11.4 (*)    Hemoglobin 17.5 (*)    HCT 52.9 (*)     MCV 101.1 (*)    Platelets 143 (*)    Neutro Abs 8.7 (*)    Monocytes Absolute 1.5 (*)    All other components within normal limits  URINALYSIS, ROUTINE W REFLEX MICROSCOPIC - Abnormal; Notable for the following components:   Color, Urine AMBER (*)    APPearance HAZY (*)    Protein, ur 30 (*)    All other components within normal limits  NOVEL CORONAVIRUS, NAA (HOSP ORDER, SEND-OUT TO REF LAB; TAT 18-24 HRS)  LACTIC ACID, PLASMA  LACTIC ACID, PLASMA    EKG None  Radiology Dg Chest 2 View  Result Date: 02/06/2019 CLINICAL DATA:  Onset fever and right flank pain 02/03/2019. EXAM: CHEST - 2 VIEW COMPARISON:  PA and lateral chest 04/03/2017. FINDINGS: Lungs clear. Heart size normal. No pneumothorax or pleural fluid. Atherosclerosis noted. No acute or focal bony abnormality. IMPRESSION: No acute disease. Atherosclerosis. Electronically Signed   By: Inge Rise M.D.   On: 02/06/2019 15:56   Ct Renal Stone Study  Result Date: 02/06/2019 CLINICAL DATA:  Flank pain. Fever. Right flank pain since Thursday. EXAM: CT ABDOMEN AND PELVIS WITHOUT CONTRAST TECHNIQUE: Multidetector CT imaging of the abdomen and pelvis was performed following the standard protocol without IV contrast. COMPARISON:  12/30/2012 FINDINGS: Lower chest: Right hemidiaphragm elevation. Normal heart size without pericardial or pleural effusion. Multivessel coronary artery atherosclerosis. Hepatobiliary: Moderate hepatic steatosis. Right hepatic lobe cysts, maximally 4.5 cm. Pneumobilia. Cholecystectomy, without biliary ductal dilatation. Pancreas: Normal, without mass or ductal dilatation. Spleen: Normal in size, without focal abnormality. Adrenals/Urinary Tract: Normal adrenal glands. Mild renal cortical thinning bilaterally. 2.2 cm upper/interpolar left renal cyst. Left renal vascular calcification. No renal calculi or hydronephrosis. No hydroureter or ureteric calculi. No bladder calculi. Stomach/Bowel: Normal stomach,  without wall thickening. Scattered colonic diverticula. Normal terminal ileum and appendix. Normal small bowel. Vascular/Lymphatic: Aortic and branch vessel atherosclerosis. Nonaneurysmal dilatation of the infrarenal aorta at 3.0 cm. No abdominopelvic adenopathy. Reproductive: Mild prostatomegaly. Other: No significant free fluid. Musculoskeletal: Proximal left femoral fixation. Lumbosacral spondylosis. 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 IMPRESSION: 1.  No  urinary tract calculi or hydronephrosis. 2. Hepatic steatosis. 3.  Aortic Atherosclerosis (ICD10-I70.0). 4. Prostatomegaly. Electronically Signed   By: Abigail Miyamoto M.D.   On: 02/06/2019 15:59    Procedures Procedures (including critical care time)  Medications Ordered in ED Medications  ciprofloxacin (CIPRO) tablet 500 mg (has no administration in time range)  acetaminophen (TYLENOL) tablet 650 mg (650 mg Oral Given 02/06/19 1631)     Initial Impression / Assessment and Plan / ED Course  I have reviewed the triage vital signs and the nursing notes.  Pertinent labs & imaging results that were available during my care of the patient were reviewed by me and considered in my medical decision making (see chart for details).        4:39 PM Patient in no distress, awake, alert. I reviewed all findings with him at length, including reassuring CT, x-ray, blood work, UA. He now notes that yesterday he did feel prickly twinge, while urinating, there are some suspicion for passed kidney stone, though this is conjecture. Patient also found to have enlarged prostate, and was subsequent for prostatitis possibly contributing to his illness, given his history of infections, he will start Cipro, while urine culture is pending. Notably, the patient has some concern about possible coronavirus, though he does not have additional complaints.  On however, given his fever, without obvious source of infection, coronavirus testing sent.  No evidence  for bacteremia, sepsis, patient amenable to discharge, with self monitoring, awaiting culture, COVID results at home.  Final Clinical Impressions(s) / ED Diagnoses   Final diagnoses:  Fever in adult    ED Discharge Orders         Ordered    ciprofloxacin (CIPRO) 500 MG tablet  2 times daily     02/06/19 1641           Carmin Muskrat, MD 02/06/19 1642

## 2019-02-06 NOTE — ED Triage Notes (Signed)
Pt states he has had a fever and right flank pain since about Thursday.

## 2019-02-08 LAB — URINE CULTURE: Culture: NO GROWTH

## 2019-02-08 LAB — NOVEL CORONAVIRUS, NAA (HOSP ORDER, SEND-OUT TO REF LAB; TAT 18-24 HRS): SARS-CoV-2, NAA: NOT DETECTED

## 2019-02-10 DIAGNOSIS — E039 Hypothyroidism, unspecified: Secondary | ICD-10-CM | POA: Diagnosis not present

## 2019-02-10 DIAGNOSIS — K769 Liver disease, unspecified: Secondary | ICD-10-CM | POA: Diagnosis not present

## 2019-02-10 DIAGNOSIS — I5030 Unspecified diastolic (congestive) heart failure: Secondary | ICD-10-CM | POA: Diagnosis not present

## 2019-02-10 DIAGNOSIS — R109 Unspecified abdominal pain: Secondary | ICD-10-CM | POA: Diagnosis not present

## 2019-02-10 DIAGNOSIS — Z20828 Contact with and (suspected) exposure to other viral communicable diseases: Secondary | ICD-10-CM | POA: Diagnosis not present

## 2019-02-10 DIAGNOSIS — K750 Abscess of liver: Secondary | ICD-10-CM | POA: Diagnosis not present

## 2019-02-10 DIAGNOSIS — R1031 Right lower quadrant pain: Secondary | ICD-10-CM | POA: Diagnosis not present

## 2019-02-10 DIAGNOSIS — B179 Acute viral hepatitis, unspecified: Secondary | ICD-10-CM | POA: Diagnosis not present

## 2019-02-10 DIAGNOSIS — G4733 Obstructive sleep apnea (adult) (pediatric): Secondary | ICD-10-CM | POA: Diagnosis not present

## 2019-02-10 DIAGNOSIS — E785 Hyperlipidemia, unspecified: Secondary | ICD-10-CM | POA: Diagnosis not present

## 2019-02-10 DIAGNOSIS — R509 Fever, unspecified: Secondary | ICD-10-CM | POA: Diagnosis not present

## 2019-02-10 DIAGNOSIS — R7881 Bacteremia: Secondary | ICD-10-CM | POA: Diagnosis not present

## 2019-02-10 DIAGNOSIS — I480 Paroxysmal atrial fibrillation: Secondary | ICD-10-CM | POA: Diagnosis not present

## 2019-02-10 DIAGNOSIS — I110 Hypertensive heart disease with heart failure: Secondary | ICD-10-CM | POA: Diagnosis not present

## 2019-02-10 DIAGNOSIS — N410 Acute prostatitis: Secondary | ICD-10-CM | POA: Diagnosis not present

## 2019-02-10 DIAGNOSIS — R0600 Dyspnea, unspecified: Secondary | ICD-10-CM | POA: Diagnosis not present

## 2019-02-10 DIAGNOSIS — I25119 Atherosclerotic heart disease of native coronary artery with unspecified angina pectoris: Secondary | ICD-10-CM | POA: Diagnosis not present

## 2019-02-10 DIAGNOSIS — R0781 Pleurodynia: Secondary | ICD-10-CM | POA: Diagnosis not present

## 2019-02-10 DIAGNOSIS — I5032 Chronic diastolic (congestive) heart failure: Secondary | ICD-10-CM | POA: Diagnosis not present

## 2019-02-13 DIAGNOSIS — K750 Abscess of liver: Secondary | ICD-10-CM | POA: Diagnosis not present

## 2019-02-21 DIAGNOSIS — U071 COVID-19: Secondary | ICD-10-CM | POA: Diagnosis not present

## 2019-02-21 DIAGNOSIS — N179 Acute kidney failure, unspecified: Secondary | ICD-10-CM | POA: Diagnosis not present

## 2019-02-21 DIAGNOSIS — K750 Abscess of liver: Secondary | ICD-10-CM | POA: Diagnosis not present

## 2019-02-21 DIAGNOSIS — Z87891 Personal history of nicotine dependence: Secondary | ICD-10-CM | POA: Diagnosis not present

## 2019-02-23 DIAGNOSIS — K750 Abscess of liver: Secondary | ICD-10-CM | POA: Diagnosis not present

## 2019-02-27 DIAGNOSIS — Z79899 Other long term (current) drug therapy: Secondary | ICD-10-CM | POA: Diagnosis not present

## 2019-02-27 DIAGNOSIS — Z4682 Encounter for fitting and adjustment of non-vascular catheter: Secondary | ICD-10-CM | POA: Diagnosis not present

## 2019-02-27 DIAGNOSIS — Z87891 Personal history of nicotine dependence: Secondary | ICD-10-CM | POA: Diagnosis not present

## 2019-02-27 DIAGNOSIS — Z5181 Encounter for therapeutic drug level monitoring: Secondary | ICD-10-CM | POA: Diagnosis not present

## 2019-02-27 DIAGNOSIS — I25119 Atherosclerotic heart disease of native coronary artery with unspecified angina pectoris: Secondary | ICD-10-CM | POA: Diagnosis not present

## 2019-02-27 DIAGNOSIS — U071 COVID-19: Secondary | ICD-10-CM | POA: Diagnosis not present

## 2019-02-27 DIAGNOSIS — R918 Other nonspecific abnormal finding of lung field: Secondary | ICD-10-CM | POA: Diagnosis not present

## 2019-02-27 DIAGNOSIS — I498 Other specified cardiac arrhythmias: Secondary | ICD-10-CM | POA: Diagnosis not present

## 2019-02-27 DIAGNOSIS — R9431 Abnormal electrocardiogram [ECG] [EKG]: Secondary | ICD-10-CM | POA: Diagnosis not present

## 2019-02-27 DIAGNOSIS — R000 Tachycardia, unspecified: Secondary | ICD-10-CM | POA: Diagnosis not present

## 2019-03-02 ENCOUNTER — Inpatient Hospital Stay (HOSPITAL_COMMUNITY)
Admission: EM | Admit: 2019-03-02 | Discharge: 2019-03-11 | DRG: 177 | Disposition: A | Discharge status: Home Health | Payer: Medicare HMO | Attending: Internal Medicine | Admitting: Internal Medicine

## 2019-03-02 ENCOUNTER — Emergency Department (HOSPITAL_COMMUNITY): Payer: Medicare HMO

## 2019-03-02 ENCOUNTER — Other Ambulatory Visit: Payer: Self-pay

## 2019-03-02 DIAGNOSIS — G4733 Obstructive sleep apnea (adult) (pediatric): Secondary | ICD-10-CM | POA: Diagnosis present

## 2019-03-02 DIAGNOSIS — I4891 Unspecified atrial fibrillation: Secondary | ICD-10-CM | POA: Diagnosis present

## 2019-03-02 DIAGNOSIS — Z79899 Other long term (current) drug therapy: Secondary | ICD-10-CM | POA: Diagnosis not present

## 2019-03-02 DIAGNOSIS — N400 Enlarged prostate without lower urinary tract symptoms: Secondary | ICD-10-CM | POA: Diagnosis present

## 2019-03-02 DIAGNOSIS — Z79891 Long term (current) use of opiate analgesic: Secondary | ICD-10-CM

## 2019-03-02 DIAGNOSIS — I2510 Atherosclerotic heart disease of native coronary artery without angina pectoris: Secondary | ICD-10-CM | POA: Diagnosis not present

## 2019-03-02 DIAGNOSIS — J9600 Acute respiratory failure, unspecified whether with hypoxia or hypercapnia: Secondary | ICD-10-CM | POA: Diagnosis present

## 2019-03-02 DIAGNOSIS — M109 Gout, unspecified: Secondary | ICD-10-CM | POA: Diagnosis present

## 2019-03-02 DIAGNOSIS — L989 Disorder of the skin and subcutaneous tissue, unspecified: Secondary | ICD-10-CM | POA: Diagnosis present

## 2019-03-02 DIAGNOSIS — I110 Hypertensive heart disease with heart failure: Secondary | ICD-10-CM | POA: Diagnosis not present

## 2019-03-02 DIAGNOSIS — Z7901 Long term (current) use of anticoagulants: Secondary | ICD-10-CM

## 2019-03-02 DIAGNOSIS — J1289 Other viral pneumonia: Secondary | ICD-10-CM | POA: Diagnosis present

## 2019-03-02 DIAGNOSIS — K750 Abscess of liver: Secondary | ICD-10-CM | POA: Diagnosis not present

## 2019-03-02 DIAGNOSIS — E785 Hyperlipidemia, unspecified: Secondary | ICD-10-CM | POA: Diagnosis not present

## 2019-03-02 DIAGNOSIS — J9601 Acute respiratory failure with hypoxia: Secondary | ICD-10-CM | POA: Diagnosis present

## 2019-03-02 DIAGNOSIS — E669 Obesity, unspecified: Secondary | ICD-10-CM | POA: Diagnosis present

## 2019-03-02 DIAGNOSIS — Z6833 Body mass index (BMI) 33.0-33.9, adult: Secondary | ICD-10-CM

## 2019-03-02 DIAGNOSIS — K651 Peritoneal abscess: Secondary | ICD-10-CM | POA: Diagnosis not present

## 2019-03-02 DIAGNOSIS — Z955 Presence of coronary angioplasty implant and graft: Secondary | ICD-10-CM

## 2019-03-02 DIAGNOSIS — I714 Abdominal aortic aneurysm, without rupture: Secondary | ICD-10-CM | POA: Diagnosis present

## 2019-03-02 DIAGNOSIS — M160 Bilateral primary osteoarthritis of hip: Secondary | ICD-10-CM | POA: Diagnosis not present

## 2019-03-02 DIAGNOSIS — E039 Hypothyroidism, unspecified: Secondary | ICD-10-CM | POA: Diagnosis present

## 2019-03-02 DIAGNOSIS — Z79890 Hormone replacement therapy (postmenopausal): Secondary | ICD-10-CM

## 2019-03-02 DIAGNOSIS — J069 Acute upper respiratory infection, unspecified: Secondary | ICD-10-CM

## 2019-03-02 DIAGNOSIS — Z87442 Personal history of urinary calculi: Secondary | ICD-10-CM | POA: Diagnosis not present

## 2019-03-02 DIAGNOSIS — Z96651 Presence of right artificial knee joint: Secondary | ICD-10-CM | POA: Diagnosis present

## 2019-03-02 DIAGNOSIS — I4819 Other persistent atrial fibrillation: Secondary | ICD-10-CM | POA: Diagnosis present

## 2019-03-02 DIAGNOSIS — I10 Essential (primary) hypertension: Secondary | ICD-10-CM | POA: Diagnosis not present

## 2019-03-02 DIAGNOSIS — Z87891 Personal history of nicotine dependence: Secondary | ICD-10-CM

## 2019-03-02 DIAGNOSIS — Z888 Allergy status to other drugs, medicaments and biological substances status: Secondary | ICD-10-CM

## 2019-03-02 DIAGNOSIS — Z841 Family history of disorders of kidney and ureter: Secondary | ICD-10-CM

## 2019-03-02 DIAGNOSIS — Z9861 Coronary angioplasty status: Secondary | ICD-10-CM | POA: Diagnosis not present

## 2019-03-02 DIAGNOSIS — I5033 Acute on chronic diastolic (congestive) heart failure: Secondary | ICD-10-CM | POA: Diagnosis not present

## 2019-03-02 DIAGNOSIS — U071 COVID-19: Secondary | ICD-10-CM | POA: Diagnosis not present

## 2019-03-02 DIAGNOSIS — M25551 Pain in right hip: Secondary | ICD-10-CM | POA: Diagnosis not present

## 2019-03-02 DIAGNOSIS — R7989 Other specified abnormal findings of blood chemistry: Secondary | ICD-10-CM | POA: Diagnosis not present

## 2019-03-02 DIAGNOSIS — J1282 Pneumonia due to coronavirus disease 2019: Secondary | ICD-10-CM | POA: Diagnosis present

## 2019-03-02 DIAGNOSIS — E873 Alkalosis: Secondary | ICD-10-CM | POA: Diagnosis not present

## 2019-03-02 DIAGNOSIS — R0602 Shortness of breath: Secondary | ICD-10-CM | POA: Diagnosis not present

## 2019-03-02 DIAGNOSIS — K769 Liver disease, unspecified: Secondary | ICD-10-CM | POA: Diagnosis not present

## 2019-03-02 DIAGNOSIS — K219 Gastro-esophageal reflux disease without esophagitis: Secondary | ICD-10-CM | POA: Diagnosis present

## 2019-03-02 LAB — COMPREHENSIVE METABOLIC PANEL
ALT: 15 U/L (ref 0–44)
AST: 32 U/L (ref 15–41)
Albumin: 3 g/dL — ABNORMAL LOW (ref 3.5–5.0)
Alkaline Phosphatase: 44 U/L (ref 38–126)
Anion gap: 15 (ref 5–15)
BUN: 22 mg/dL (ref 8–23)
CO2: 20 mmol/L — ABNORMAL LOW (ref 22–32)
Calcium: 8 mg/dL — ABNORMAL LOW (ref 8.9–10.3)
Chloride: 99 mmol/L (ref 98–111)
Creatinine, Ser: 1.12 mg/dL (ref 0.61–1.24)
GFR calc Af Amer: >60 mL/min (ref >60)
GFR calc non Af Amer: >60 mL/min (ref >60)
Glucose, Bld: 103 mg/dL — ABNORMAL HIGH (ref 70–99)
Potassium: 4.1 mmol/L (ref 3.5–5.1)
Sodium: 134 mmol/L — ABNORMAL LOW (ref 135–145)
Total Bilirubin: 0.7 mg/dL (ref 0.3–1.2)
Total Protein: 7.2 g/dL (ref 6.5–8.1)

## 2019-03-02 LAB — CBC WITH DIFFERENTIAL/PLATELET
Abs Immature Granulocytes: 0.04 10*3/uL (ref 0.00–0.07)
Basophils Absolute: 0 10*3/uL (ref 0.0–0.1)
Basophils Relative: 0 %
Eosinophils Absolute: 0 10*3/uL (ref 0.0–0.5)
Eosinophils Relative: 0 %
HCT: 45.8 % (ref 39.0–52.0)
Hemoglobin: 15.2 g/dL (ref 13.0–17.0)
Immature Granulocytes: 1 %
Lymphocytes Relative: 6 %
Lymphs Abs: 0.4 10*3/uL — ABNORMAL LOW (ref 0.7–4.0)
MCH: 32.7 pg (ref 26.0–34.0)
MCHC: 33.2 g/dL (ref 30.0–36.0)
MCV: 98.5 fL (ref 80.0–100.0)
Monocytes Absolute: 0.6 10*3/uL (ref 0.1–1.0)
Monocytes Relative: 9 %
Neutro Abs: 6.3 10*3/uL (ref 1.7–7.7)
Neutrophils Relative %: 84 %
Platelets: 150 10*3/uL (ref 150–400)
RBC: 4.65 MIL/uL (ref 4.22–5.81)
RDW: 14.1 % (ref 11.5–15.5)
WBC: 7.4 10*3/uL (ref 4.0–10.5)
nRBC: 0 % (ref 0.0–0.2)

## 2019-03-02 LAB — TRIGLYCERIDES: Triglycerides: 62 mg/dL (ref <150)

## 2019-03-02 LAB — TROPONIN I (HIGH SENSITIVITY)
Troponin I (High Sensitivity): 13 ng/L (ref <18)
Troponin I (High Sensitivity): 14 ng/L (ref <18)

## 2019-03-02 LAB — BLOOD GAS, ARTERIAL
Acid-base deficit: 1 mmol/L (ref 0.0–2.0)
Bicarbonate: 24.2 mmol/L (ref 20.0–28.0)
FIO2: 100
O2 Saturation: 95.1 %
Patient temperature: 37
pCO2 arterial: 31.7 mmHg — ABNORMAL LOW (ref 32.0–48.0)
pH, Arterial: 7.461 — ABNORMAL HIGH (ref 7.350–7.450)
pO2, Arterial: 78.6 mmHg — ABNORMAL LOW (ref 83.0–108.0)

## 2019-03-02 LAB — LACTIC ACID, PLASMA
Lactic Acid, Venous: 3.1 mmol/L (ref 0.5–1.9)
Lactic Acid, Venous: 3.4 mmol/L (ref 0.5–1.9)

## 2019-03-02 LAB — SARS CORONAVIRUS 2 BY RT PCR (HOSPITAL ORDER, PERFORMED IN ~~LOC~~ HOSPITAL LAB): SARS Coronavirus 2: POSITIVE — AB

## 2019-03-02 LAB — FIBRINOGEN: Fibrinogen: >800 mg/dL — ABNORMAL HIGH (ref 210–475)

## 2019-03-02 LAB — PROCALCITONIN: Procalcitonin: 0.34 ng/mL

## 2019-03-02 LAB — BRAIN NATRIURETIC PEPTIDE: B Natriuretic Peptide: 77 pg/mL (ref 0.0–100.0)

## 2019-03-02 LAB — GLUCOSE, CAPILLARY: Glucose-Capillary: 126 mg/dL — ABNORMAL HIGH (ref 70–99)

## 2019-03-02 LAB — D-DIMER, QUANTITATIVE: D-Dimer, Quant: 19.8 ug/mL-FEU — ABNORMAL HIGH (ref 0.00–0.50)

## 2019-03-02 LAB — C-REACTIVE PROTEIN: CRP: 24 mg/dL — ABNORMAL HIGH (ref <1.0)

## 2019-03-02 LAB — LACTATE DEHYDROGENASE: LDH: 370 U/L — ABNORMAL HIGH (ref 98–192)

## 2019-03-02 LAB — FERRITIN: Ferritin: 507 ng/mL — ABNORMAL HIGH (ref 24–336)

## 2019-03-02 MED ORDER — VITAMIN C 500 MG PO TABS
500.0000 mg | ORAL_TABLET | Freq: Every day | ORAL | Status: DC
  Administered 2019-03-02 – 2019-03-11 (×10): 500 mg via ORAL
  Filled 2019-03-02 (×10): qty 1

## 2019-03-02 MED ORDER — VANCOMYCIN HCL IN DEXTROSE 1-5 GM/200ML-% IV SOLN
1000.0000 mg | Freq: Once | INTRAVENOUS | Status: AC
  Administered 2019-03-02: 1000 mg via INTRAVENOUS

## 2019-03-02 MED ORDER — ALBUTEROL SULFATE HFA 108 (90 BASE) MCG/ACT IN AERS
8.0000 | INHALATION_SPRAY | Freq: Once | RESPIRATORY_TRACT | Status: AC
  Administered 2019-03-02: 8 via RESPIRATORY_TRACT
  Filled 2019-03-02: qty 6.7

## 2019-03-02 MED ORDER — SODIUM CHLORIDE 0.9% FLUSH: 3.0000 mL | INTRAVENOUS | Status: DC | PRN

## 2019-03-02 MED ORDER — SODIUM CHLORIDE 0.9 % IV BOLUS
500.0000 mL | Freq: Once | INTRAVENOUS | Status: AC
  Administered 2019-03-02: 500 mL via INTRAVENOUS

## 2019-03-02 MED ORDER — VANCOMYCIN HCL IN DEXTROSE 1-5 GM/200ML-% IV SOLN: 1000.0000 mg | Freq: Two times a day (BID) | INTRAVENOUS | Status: DC

## 2019-03-02 MED ORDER — SODIUM CHLORIDE 0.9 % IV SOLN: 250.0000 mL | INTRAVENOUS | Status: DC | PRN

## 2019-03-02 MED ORDER — APIXABAN 5 MG PO TABS
5.0000 mg | ORAL_TABLET | Freq: Two times a day (BID) | ORAL | Status: DC
  Administered 2019-03-02 – 2019-03-04 (×5): 5 mg via ORAL
  Filled 2019-03-02 (×5): qty 1

## 2019-03-02 MED ORDER — SODIUM CHLORIDE 0.9 % IV SOLN
2.0000 g | Freq: Once | INTRAVENOUS | Status: AC
  Administered 2019-03-02: 2 g via INTRAVENOUS
  Filled 2019-03-02: qty 2

## 2019-03-02 MED ORDER — ZINC SULFATE 220 (50 ZN) MG PO CAPS
220.0000 mg | ORAL_CAPSULE | Freq: Every day | ORAL | Status: DC
  Administered 2019-03-02 – 2019-03-11 (×10): 220 mg via ORAL
  Filled 2019-03-02 (×10): qty 1

## 2019-03-02 MED ORDER — LEVOTHYROXINE SODIUM 75 MCG PO TABS
150.0000 ug | ORAL_TABLET | Freq: Every day | ORAL | Status: DC
  Administered 2019-03-03: 150 ug via ORAL
  Filled 2019-03-02: qty 2

## 2019-03-02 MED ORDER — AMIODARONE HCL 100 MG PO TABS
200.0000 mg | ORAL_TABLET | Freq: Every day | ORAL | Status: DC
  Administered 2019-03-02 – 2019-03-03 (×2): 200 mg via ORAL
  Filled 2019-03-02: qty 2

## 2019-03-02 MED ORDER — SODIUM CHLORIDE 0.9 % IV SOLN
200.0000 mg | Freq: Once | INTRAVENOUS | Status: AC
  Administered 2019-03-02: 200 mg via INTRAVENOUS
  Filled 2019-03-02: qty 40

## 2019-03-02 MED ORDER — INSULIN ASPART 100 UNIT/ML ~~LOC~~ SOLN: 0.0000 [IU] | Freq: Three times a day (TID) | SUBCUTANEOUS | Status: DC

## 2019-03-02 MED ORDER — SODIUM CHLORIDE 0.9 % IV SOLN
100.0000 mg | INTRAVENOUS | Status: AC
  Administered 2019-03-03 – 2019-03-06 (×4): 100 mg via INTRAVENOUS
  Filled 2019-03-02 (×4): qty 20

## 2019-03-02 MED ORDER — AMIODARONE HCL IN DEXTROSE 360-4.14 MG/200ML-% IV SOLN
30.0000 mg/h | INTRAVENOUS | Status: DC
  Administered 2019-03-02: 30 mg/h via INTRAVENOUS
  Filled 2019-03-02 (×2): qty 200

## 2019-03-02 MED ORDER — ALBUTEROL SULFATE HFA 108 (90 BASE) MCG/ACT IN AERS
2.0000 | INHALATION_SPRAY | Freq: Four times a day (QID) | RESPIRATORY_TRACT | Status: DC
  Administered 2019-03-02 – 2019-03-03 (×3): 2 via RESPIRATORY_TRACT
  Filled 2019-03-02: qty 6.7

## 2019-03-02 MED ORDER — SODIUM CHLORIDE 0.9 % IV BOLUS
1000.0000 mL | Freq: Once | INTRAVENOUS | Status: AC
  Administered 2019-03-02: 1000 mL via INTRAVENOUS

## 2019-03-02 MED ORDER — SODIUM CHLORIDE 0.9 % IV SOLN: 2.0000 g | Freq: Three times a day (TID) | INTRAVENOUS | Status: DC

## 2019-03-02 MED ORDER — METHYLPREDNISOLONE SODIUM SUCC 125 MG IJ SOLR: 0.5000 mg/kg | Freq: Two times a day (BID) | INTRAMUSCULAR | Status: DC

## 2019-03-02 MED ORDER — ALLOPURINOL 100 MG PO TABS
300.0000 mg | ORAL_TABLET | Freq: Every morning | ORAL | Status: DC
  Administered 2019-03-03 – 2019-03-11 (×9): 300 mg via ORAL
  Filled 2019-03-02 (×9): qty 3

## 2019-03-02 MED ORDER — SODIUM CHLORIDE 0.9% FLUSH
3.0000 mL | Freq: Two times a day (BID) | INTRAVENOUS | Status: DC
  Administered 2019-03-02: 3 mL via INTRAVENOUS

## 2019-03-02 MED ORDER — AMIODARONE HCL IN DEXTROSE 360-4.14 MG/200ML-% IV SOLN
60.0000 mg/h | INTRAVENOUS | Status: DC
  Administered 2019-03-02: 13:00:00 60 mg/h via INTRAVENOUS
  Filled 2019-03-02 (×2): qty 200

## 2019-03-02 MED ORDER — VANCOMYCIN HCL IN DEXTROSE 1-5 GM/200ML-% IV SOLN
1000.0000 mg | INTRAVENOUS | Status: AC
  Administered 2019-03-02: 1000 mg via INTRAVENOUS
  Filled 2019-03-02 (×2): qty 200

## 2019-03-02 MED ORDER — TRAZODONE HCL 50 MG PO TABS
50.0000 mg | ORAL_TABLET | Freq: Every evening | ORAL | Status: DC | PRN
  Administered 2019-03-02 – 2019-03-10 (×6): 50 mg via ORAL
  Filled 2019-03-02 (×7): qty 1

## 2019-03-02 MED ORDER — HYDROCOD POLST-CPM POLST ER 10-8 MG/5ML PO SUER: 5.0000 mL | Freq: Two times a day (BID) | ORAL | Status: DC | PRN

## 2019-03-02 MED ORDER — DEXAMETHASONE SODIUM PHOSPHATE 10 MG/ML IJ SOLN
10.0000 mg | Freq: Once | INTRAMUSCULAR | Status: AC
  Administered 2019-03-02: 10 mg via INTRAVENOUS
  Filled 2019-03-02: qty 1

## 2019-03-02 MED ORDER — VANCOMYCIN HCL IN DEXTROSE 1-5 GM/200ML-% IV SOLN: 1000.0000 mg | Freq: Once | INTRAVENOUS | Status: DC

## 2019-03-02 MED ORDER — ACETAMINOPHEN 325 MG PO TABS
650.0000 mg | ORAL_TABLET | Freq: Four times a day (QID) | ORAL | Status: DC | PRN
  Administered 2019-03-05 – 2019-03-09 (×2): 650 mg via ORAL
  Filled 2019-03-02 (×2): qty 2

## 2019-03-02 MED ORDER — CIPROFLOXACIN HCL 500 MG PO TABS
500.0000 mg | ORAL_TABLET | Freq: Two times a day (BID) | ORAL | Status: DC
  Administered 2019-03-02 – 2019-03-11 (×17): 500 mg via ORAL
  Filled 2019-03-02 (×21): qty 1

## 2019-03-02 MED ORDER — ONDANSETRON HCL 4 MG PO TABS: 4.0000 mg | ORAL_TABLET | Freq: Four times a day (QID) | ORAL | Status: DC | PRN

## 2019-03-02 MED ORDER — ONDANSETRON HCL 4 MG/2ML IJ SOLN: 4.0000 mg | Freq: Four times a day (QID) | INTRAMUSCULAR | Status: DC | PRN

## 2019-03-02 MED ORDER — POLYETHYLENE GLYCOL 3350 17 G PO PACK: 17.0000 g | PACK | Freq: Every day | ORAL | Status: DC | PRN

## 2019-03-02 MED ORDER — INSULIN ASPART 100 UNIT/ML ~~LOC~~ SOLN: 0.0000 [IU] | Freq: Every day | SUBCUTANEOUS | Status: DC

## 2019-03-02 MED ORDER — AMIODARONE HCL 100 MG PO TABS: 200.0000 mg | ORAL_TABLET | Freq: Every day | ORAL | Status: DC

## 2019-03-02 MED ORDER — ACETAMINOPHEN 650 MG RE SUPP: 650.0000 mg | Freq: Four times a day (QID) | RECTAL | Status: DC | PRN

## 2019-03-02 MED ORDER — ADULT MULTIVITAMIN W/MINERALS CH
1.0000 | ORAL_TABLET | Freq: Every day | ORAL | Status: DC
  Administered 2019-03-02 – 2019-03-11 (×10): 1 via ORAL
  Filled 2019-03-02 (×10): qty 1

## 2019-03-02 MED ORDER — HYDROCODONE-ACETAMINOPHEN 5-325 MG PO TABS
1.0000 | ORAL_TABLET | Freq: Four times a day (QID) | ORAL | Status: DC | PRN
  Administered 2019-03-02 – 2019-03-04 (×4): 1 via ORAL
  Filled 2019-03-02 (×4): qty 1

## 2019-03-02 MED ORDER — GUAIFENESIN-DM 100-10 MG/5ML PO SYRP
10.0000 mL | ORAL_SOLUTION | ORAL | Status: DC | PRN
  Administered 2019-03-02 – 2019-03-04 (×4): 10 mL via ORAL
  Filled 2019-03-02 (×5): qty 10

## 2019-03-02 NOTE — ED Notes (Signed)
Family given update 

## 2019-03-02 NOTE — ED Triage Notes (Signed)
Pt in from home brought in by family, pt 75% on RA, pt has labored breathing, pt using accessory muscles, pt c/o generalized weakness, pt has JP drain in place, pt states, "I have a liver infection." pt family left their contact info [facility] sone cell [phone], Home [phone], pt on daughter-in-law Kennyth Pearson [phone]- home [phone], pt placed on NRB upon arrival to room

## 2019-03-02 NOTE — ED Notes (Signed)
ED TO INPATIENT HANDOFF REPORT  ED Nurse Name and Phone #: [phone]  S Name/Age/Gender Wayne Ortiz 73 y.o. male Room/Bed: APA10/APA10  Code Status   Code Status: Prior  Home/SNF/Other Home Patient oriented to: self, place, time and situation Is this baseline? Yes   Triage Complete: Triage complete  Chief Complaint Shortness of Breath  Triage Note Pt in from home brought in by family, pt 75% on RA, pt has labored breathing, pt using accessory muscles, pt c/o generalized weakness, pt has JP drain in place, pt states, "I have a liver infection." pt family left their contact info Linda Seper sone cell [phone], Home [phone], pt on daughter-in-law Hawk Taufa [phone]- home [phone], pt placed on NRB upon arrival to room   Allergies Allergies  Allergen Reactions  . Atorvastatin Other (See Comments)    Arthralgias  . Lisinopril Other (See Comments)    CP and SOB  . Metoprolol Other (See Comments)    CP and SOb  . Rocephin [Ceftriaxone Sodium In Dextrose] Rash    Level of Care/Admitting Diagnosis ED Disposition    ED Disposition Condition [facility]: [place] [100101]  Level of Care: Progressive [102]  Covid Evaluation: Confirmed COVID Positive  Diagnosis: Acute respiratory disease due to COVID-19 virus [id number]  Admitting Physician: Morrison Old  Attending Physician: Morrison Old  Estimated length of stay: 3 - 4 days  Certification:: I certify this patient will need inpatient services for at least 2 midnights  Bed request comments: progressive  PT Class (Do Not Modify): Inpatient [101]  PT Acc Code (Do Not Modify): Private [1]       B Medical/Surgery History Past Medical History:  Diagnosis Date  . Arthritis   . Atrial fibrillation ([place])   . BPH (benign prostatic hypertrophy)   . CAD (coronary artery disease)    a. s/p DES 05/2014 at [facility]  . Complication of anesthesia     BECAME COMBATIVE  . Dysrhythmia   . Femur fracture ([place])   . GERD (gastroesophageal reflux disease)    occasional TUMS  . Gout   . History of gout   . History of kidney stones   . Hypertension   . Hypothyroidism   . Persistent atrial fibrillation ([place])   . Precancerous skin lesion   . Renal disorder    Past Surgical History:  Procedure Laterality Date  . arm fracture surgery Left   . CARDIOVERSION [name] 12/09/2014   Procedure: CARDIOVERSION;  Surgeon: [name], MD;  Location: [place];  Service: Cardiovascular;  Laterality: [name];  . CARDIOVERSION [name] 05/05/2016   Procedure: CARDIOVERSION;  Surgeon: [name], MD;  Location: [place];  Service: Cardiovascular;  Laterality: [name];  . CARDIOVERSION [name] 08/22/2016   Procedure: CARDIOVERSION;  Surgeon: Lelon Perla, MD;  Location: [facility];  Service: Cardiovascular;  Laterality: [name];  . CATARACTS     EXCISION  . CERVICAL SPINE SURGERY    . CHOLECYSTECTOMY [name] 01/03/2013   Procedure: LAPAROSCOPIC CHOLECYSTECTOMY;  Surgeon: [name], MD;  Location[name];  Service: General;  Laterality: [name];  . ELECTROPHYSIOLOGIC STUDY [name] 03/15/2015   Procedure: Atrial Fibrillation Ablation;  Surgeon: [name], MD;  Location: [place] [facility];  Service: Cardiovascular;  Laterality: [name];  . ELECTROPHYSIOLOGIC STUDY [name] 04/01/2016   Procedure: Atrial Fibrillation Ablation;  Surgeon: [name], MD;  Location: [place] [facility];  Service: Cardiovascular;  Laterality: [name];  . ERCP  [name] 01/02/2013   Procedure: ENDOSCOPIC RETROGRADE CHOLANGIOPANCREATOGRAPHY (ERCP);  Surgeon: [name], MD;  Location: [name];  Service: Endoscopy;  Laterality: [name];  . FEMUR FRACTURE SURGERY    . LIVER BIOPSY [name] 01/03/2013   Procedure: LIVER BIOPSY;  Surgeon: [name], MD;  Location[name];  Service: General;  Laterality: [name];  . RIGHT/LEFT HEART CATH AND CORONARY ANGIOGRAPHY [name] 07/04/2016   Procedure: Right/Left Heart Cath and Coronary  Angiography;  Surgeon: Belva Crome, MD;  Location: [place] [facility];  Service: Cardiovascular;  Laterality: [name];  . SPHINCTEROTOMY [name] 01/02/2013   Procedure: SPHINCTEROTOMY;  Surgeon: [name], MD;  Location: [name];  Service: Endoscopy;  Laterality: [name];  Stone Extraction  . TEE WITHOUT CARDIOVERSION [name] 08/22/2016   Procedure: TRANSESOPHAGEAL ECHOCARDIOGRAM (TEE);  Surgeon: Lelon Perla, MD;  Location: [place];  Service: Cardiovascular;  Laterality: [name];  . TOTAL KNEE ARTHROPLASTY Right 05/30/2013   Procedure: RIGHT TOTAL KNEE ARTHROPLASTY;  Surgeon: [name], MD;  Location: [name];  Service: Orthopedics;  Laterality: Right;     A IV Location/Drains/Wounds Patient Lines/Drains/Airways Status   Active Line/Drains/Airways    Name:   Placement date:   Placement time:   Site:   Days:   Peripheral IV 03/02/19 Left Wrist   03/02/19    1100    Wrist   less than 1          Intake/Output Last 24 hours  Intake/Output Summary (Last 24 hours) at 03/02/2019 1839 Last data filed at 03/02/2019 1835 Gross per 24 hour  Intake 3192.35 ml  Output -  Net 3192.35 ml    Labs/Imaging Results for orders placed or performed during the hospital encounter of 03/02/19 (from the past 48 hour(s))  Lactic acid, plasma     Status: Abnormal   Collection Time: 03/02/19 10:38 AM  Result Value Ref Range   Lactic Acid, Venous 3.1 (HH) 0.5 - 1.9 mmol/L    Comment: CRITICAL RESULT CALLED TO, READ BACK BY AND VERIFIED WITH: KEMP,C AT 11:15AM ON 03/02/19 BY Derrill Memo Performed at [facility], [address] [place], [place] [address]   CBC WITH DIFFERENTIAL     Status: Abnormal   Collection Time: 03/02/19 10:38 AM  Result Value Ref Range   WBC 7.4 4.0 - 10.5 K/uL   RBC 4.65 4.22 - 5.81 MIL/uL   Hemoglobin 15.2 13.0 - 17.0 g/dL   HCT 45.8 39.0 - 52.0 %   MCV 98.5 80.0 - 100.0 fL   MCH 32.7 26.0 - 34.0 pg   MCHC 33.2 30.0 - 36.0 g/dL   RDW 14.1 11.5 - 15.5 %   Platelets 150 150 - 400  K/uL   nRBC 0.0 0.0 - 0.2 %   Neutrophils Relative % 84 %   Neutro Abs 6.3 1.7 - 7.7 K/uL   Lymphocytes Relative 6 %   Lymphs Abs 0.4 (L) 0.7 - 4.0 K/uL   Monocytes Relative 9 %   Monocytes Absolute 0.6 0.1 - 1.0 K/uL   Eosinophils Relative 0 %   Eosinophils Absolute 0.0 0.0 - 0.5 K/uL   Basophils Relative 0 %   Basophils Absolute 0.0 0.0 - 0.1 K/uL   Immature Granulocytes 1 %   Abs Immature Granulocytes 0.04 0.00 - 0.07 K/uL    Comment: Performed at [facility], [address], [place], [place] [address]  Comprehensive metabolic panel     Status: Abnormal   Collection Time: 03/02/19 10:38 AM  Result Value  Ref Range   Sodium 134 (L) 135 - 145 mmol/L   Potassium 4.1 3.5 - 5.1 mmol/L   Chloride 99 98 - 111 mmol/L   CO2 20 (L) 22 - 32 mmol/L   Glucose, Bld 103 (H) 70 - 99 mg/dL   BUN 22 8 - 23 mg/dL   Creatinine, Ser 1.12 0.61 - 1.24 mg/dL   Calcium 8.0 (L) 8.9 - 10.3 mg/dL   Total Protein 7.2 6.5 - 8.1 g/dL   Albumin 3.0 (L) 3.5 - 5.0 g/dL   AST 32 15 - 41 U/L   ALT 15 0 - 44 U/L   Alkaline Phosphatase 44 38 - 126 U/L   Total Bilirubin 0.7 0.3 - 1.2 mg/dL   GFR calc non Af Amer >60 >60 mL/min   GFR calc Af Amer >60 >60 mL/min   Anion gap 15 5 - 15    Comment: Performed at [facility], [address] [place], [place] [address]  D-dimer, quantitative     Status: Abnormal   Collection Time: 03/02/19 10:38 AM  Result Value Ref Range   D-Dimer, Quant 19.80 (H) 0.00 - 0.50 ug/mL-FEU    Comment: (NOTE) At the manufacturer cut-off of 0.50 ug/mL FEU, this assay has been documented to exclude PE with a sensitivity and negative predictive value of 97 to 99%.  At this time, this assay has not been approved by the FDA to exclude DVT/VTE. Results should be correlated with clinical presentation. Performed at [facility], [address], [place],  [address]   Procalcitonin     Status: None   Collection Time: 03/02/19 10:38 AM  Result Value Ref Range   Procalcitonin 0.34 ng/mL     Comment:        Interpretation: PCT (Procalcitonin) <= 0.5 ng/mL: Systemic infection (sepsis) is not likely. Local bacterial infection is possible. (NOTE)       Sepsis PCT Algorithm           Lower Respiratory Tract                                      Infection PCT Algorithm    ----------------------------     ----------------------------         PCT < 0.25 ng/mL                PCT < 0.10 ng/mL         Strongly encourage             Strongly discourage   discontinuation of antibiotics    initiation of antibiotics    ----------------------------     -----------------------------       PCT 0.25 - 0.50 ng/mL            PCT 0.10 - 0.25 ng/mL               OR       >80% decrease in PCT            Discourage initiation of                                            antibiotics      Encourage discontinuation           of antibiotics    ----------------------------     -----------------------------  PCT >= 0.50 ng/mL              PCT 0.26 - 0.50 ng/mL               AND        <80% decrease in PCT             Encourage initiation of                                             antibiotics       Encourage continuation           of antibiotics    ----------------------------     -----------------------------        PCT >= 0.50 ng/mL                  PCT > 0.50 ng/mL               AND         increase in PCT                  Strongly encourage                                      initiation of antibiotics    Strongly encourage escalation           of antibiotics                                     -----------------------------                                           PCT <= 0.25 ng/mL                                                 OR                                        > 80% decrease in PCT                                     Discontinue / Do not initiate                                             antibiotics Performed at [facility], [address], [place], [place]  [address]   Lactate dehydrogenase     Status: Abnormal   Collection Time: 03/02/19 10:38 AM  Result Value Ref Range   LDH 370 (H) 98 - 192 U/L    Comment: Performed at [facility], [address] [place], [place] [address]  Ferritin     Status: Abnormal  Collection Time: 03/02/19 10:38 AM  Result Value Ref Range   Ferritin 507 (H) 24 - 336 ng/mL    Comment: Performed at [facility], [address] [place], [place] [address]  Triglycerides     Status: None   Collection Time: 03/02/19 10:38 AM  Result Value Ref Range   Triglycerides 62 <150 mg/dL    Comment: Performed at [facility], [address], [place], [place] [address]  Fibrinogen     Status: Abnormal   Collection Time: 03/02/19 10:38 AM  Result Value Ref Range   Fibrinogen >800 (H) 210 - 475 mg/dL    Comment: Performed at [facility], [address] [place], [place] [address]  C-reactive protein     Status: Abnormal   Collection Time: 03/02/19 10:38 AM  Result Value Ref Range   CRP 24 (H) <1.0 mg/dL    Comment: RESULTS CONFIRMED BY MANUAL DILUTION Performed at [facility], [address], [place], [place] [address]   Blood gas, arterial (at [facility])     Status: Abnormal   Collection Time: 03/02/19 10:43 AM  Result Value Ref Range   FIO2 100.00    pH, Arterial 7.461 (H) 7.350 - 7.450   pCO2 arterial 31.7 (L) 32.0 - 48.0 mmHg   pO2, Arterial 78.6 (L) 83.0 - 108.0 mmHg   Bicarbonate 24.2 20.0 - 28.0 mmol/L   Acid-base deficit 1.0 0.0 - 2.0 mmol/L   O2 Saturation 95.1 %   Patient temperature 37.0    Allens test (pass/fail) PASS PASS    Comment: Performed at [facility], [address], [place], [place] [address]  Troponin I (High Sensitivity)     Status: None   Collection Time: 03/02/19 10:46 AM  Result Value Ref Range   Troponin I (High Sensitivity) 13 <18 ng/L    Comment: (NOTE) Elevated high sensitivity troponin I (hsTnI) values and significant  changes across serial measurements may suggest ACS but many other   chronic and acute conditions are known to elevate hsTnI results.  Refer to the "Links" section for chest pain algorithms and additional  guidance. Performed at [facility], [address] [place], [place] [address]   Brain natriuretic peptide     Status: None   Collection Time: 03/02/19 10:46 AM  Result Value Ref Range   B Natriuretic Peptide 77.0 0.0 - 100.0 pg/mL    Comment: Performed at [facility], [address], [place], [place] [address]  SARS Coronavirus 2 by RT PCR (hospital order, performed in [facility] hospital lab) Nasopharyngeal Nasopharyngeal Swab     Status: Abnormal   Collection Time: 03/02/19 11:08 AM   Specimen: Nasopharyngeal Swab  Result Value Ref Range   SARS Coronavirus 2 POSITIVE (A) NEGATIVE    Comment: RESULT CALLED TO, READ BACK BY AND VERIFIED WITH: R HOLCOLMB AT 1410 BY HFLYNT 03/02/19 (NOTE) If result is NEGATIVE SARS-CoV-2 target nucleic acids are NOT DETECTED. The SARS-CoV-2 RNA is generally detectable in upper and lower  respiratory specimens during the acute phase of infection. The lowest  concentration of SARS-CoV-2 viral copies this assay can detect is 250  copies / mL. A negative result does not preclude SARS-CoV-2 infection  and should not be used as the sole basis for treatment or other  patient management decisions.  A negative result may occur with  improper specimen collection / handling, submission of specimen other  than nasopharyngeal swab, presence of viral mutation(s) within the  areas targeted by this assay, and inadequate  number of viral copies  (<250 copies / mL). A negative result must be combined with clinical  observations, patient history, and epidemiological information. If result is POSITIVE SARS-CoV-2 target nucleic acids are DETECTED.  The SARS-CoV-2 RNA is generally detectable in upper and lower  respiratory specimens during the acute phase of infection.  Positive  results are indicative of active infection with  SARS-CoV-2.  Clinical  correlation with patient history and other diagnostic information is  necessary to determine patient infection status.  Positive results do  not rule out bacterial infection or co-infection with other viruses. If result is PRESUMPTIVE POSTIVE SARS-CoV-2 nucleic acids MAY BE PRESENT.   A presumptive positive result was obtained on the submitted specimen  and confirmed on repeat testing.  While 2019 novel coronavirus  (SARS-CoV-2) nucleic acids may be present in the submitted sample  additional confirmatory testing may be necessary for epidemiological  and / or clinical management purposes  to differentiate between  SARS-CoV-2 and other Sarbecovirus currently known to infect humans.  If clinically indicated additional testing with an alternate test  methodology [phone])  is advised. The SARS-CoV-2 RNA is generally  detectable in upper and lower respiratory specimens during the acute  phase of infection. The expected result is Negative. Fact Sheet for Patients:  StrictlyIdeas.no Fact Sheet for Healthcare Providers: BankingDealers.co.za This test is not yet approved or cleared by the [place] FDA and has been authorized for detection and/or diagnosis of SARS-CoV-2 by FDA under an Emergency Use Authorization (EUA).  This EUA will remain in effect (meaning this test can be used) for the duration of the COVID-19 declaration under Section 564(b)(1) of the Act, 21 U.S.C. section 360bbb-3(b)(1), unless the authorization is terminated or revoked sooner. Performed at [facility], [address] [place], [place] [address]   Blood Culture (routine x 2)     Status: None (Preliminary result)   Collection Time: 03/02/19 11:37 AM   Specimen: BLOOD LEFT ARM  Result Value Ref Range   Specimen Description BLOOD LEFT ARM    Special Requests      BOTTLES DRAWN AEROBIC AND ANAEROBIC Blood Culture adequate volume   Culture       NO GROWTH <12 HOURS Performed at [facility], [address] [place], [place] [address]    Report Status PENDING   Blood Culture (routine x 2)     Status: None (Preliminary result)   Collection Time: 03/02/19 11:37 AM   Specimen: BLOOD LEFT ARM  Result Value Ref Range   Specimen Description BLOOD LEFT ARM    Special Requests      BOTTLES DRAWN AEROBIC AND ANAEROBIC Blood Culture adequate volume   Culture      NO GROWTH <12 HOURS Performed at [facility], [address], [place], [place] [address]    Report Status PENDING   Lactic acid, plasma     Status: Abnormal   Collection Time: 03/02/19  1:01 PM  Result Value Ref Range   Lactic Acid, Venous 3.4 (HH) 0.5 - 1.9 mmol/L    Comment: CRITICAL RESULT CALLED TO, READ BACK BY AND VERIFIED WITH: Ezra Denne,H AT 1400 ON 11.4.20 BY ISLEY,B Performed at [facility], [address], [place], [place] [address]   Troponin I (High Sensitivity)     Status: None   Collection Time: 03/02/19  1:01 PM  Result Value Ref Range   Troponin I (High Sensitivity) 14 <18 ng/L    Comment: (NOTE) Elevated high sensitivity troponin I (hsTnI) values and significant  changes across serial measurements may suggest ACS but many other  chronic and acute conditions are known to elevate hsTnI results.  Refer to the "Links" section for chest pain algorithms and additional  guidance. Performed at [facility], [address] [place], [place] [address]    Dg Pelvis Portable  Result Date: 03/02/2019 CLINICAL DATA:  Right hip pain.  No injury. EXAM: PORTABLE PELVIS 1-2 VIEWS COMPARISON:  CT abdomen pelvis dated February 06, 2019. FINDINGS: No acute fracture or dislocation. Prior left proximal femur ORIF. Unchanged mild bilateral hip joint space narrowing with small marginal osteophytes. The pubic symphysis and sacroiliac joints are unremarkable. Lower lumbar spondylosis. Bone mineralization is normal. Soft tissues are unremarkable. IMPRESSION: 1.  No acute osseous  abnormality. 2. Unchanged mild bilateral hip degenerative changes. Electronically Signed   By: Titus Dubin M.D.   On: 03/02/2019 11:27   Dg Chest Port 1 View  Result Date: 03/02/2019 CLINICAL DATA:  Severe shortness of breath. EXAM: PORTABLE CHEST 1 VIEW COMPARISON:  Chest x-ray dated February 06, 2019. FINDINGS: The heart size and mediastinal contours are within normal limits. Normal pulmonary vascularity. Patchy asymmetric peripheral opacities in the left greater than right mid to lower lungs. No pleural effusion or pneumothorax. Unchanged elevation of the right hemidiaphragm. No acute osseous abnormality. IMPRESSION: 1. There are findings in the lungs which are nonspecific, but concerning for atypical infection, including potential viral pneumonia. Electronically Signed   By: Titus Dubin M.D.   On: 03/02/2019 11:25    Pending Labs Unresulted Labs (From admission, onward)    Start     Ordered   03/04/19 0500  Creatinine, serum  Every Mon-Wed-Fri (0500),   R     03/02/19 1424   03/02/19 1311  MRSA PCR Screening  Once,   STAT     03/02/19 1310   Signed and Held  ABO/Rh  Once,   R     Signed and Held   Signed and Held  CBC with Differential/Platelet  Daily,   R     Signed and Held   Signed and Held  Comprehensive metabolic panel  Daily,   R     Signed and Held   Signed and Held  C-reactive protein  Daily,   R     Signed and Held   Signed and Held  D-dimer, quantitative (not at [facility])  Daily,   R     Signed and Held   Signed and Held  Ferritin  Daily,   R     Signed and Held   Signed and Held  Magnesium  Daily,   R     Signed and Held   Signed and Held  Phosphorus  Daily,   R     Signed and Held          Vitals/Pain Today's Vitals   03/02/19 1732 03/02/19 1800 03/02/19 1804 03/02/19 1830  BP:  101/68  103/76  Pulse: 91  78 82  Resp: (!) 34 (!) 27 (!) 29 (!) 27  SpO2: 98%  98% 99%  Weight:      Height:        Isolation Precautions Airborne and Contact  precautions  Medications Medications  vancomycin (VANCOCIN) IVPB 1000 mg/200 mL premix ( Intravenous Canceled Entry 03/02/19 1437)  amiodarone (NEXTERONE PREMIX) 360-4.14 MG/200ML-% (1.8 mg/mL) IV infusion (0 mg/hr Intravenous Stopped 03/02/19 1835)  amiodarone (NEXTERONE PREMIX) 360-4.14 MG/200ML-% (1.8 mg/mL) IV infusion (30 mg/hr Intravenous New Bag/Given 03/02/19 1835)  insulin  aspart (novoLOG) injection 0-9 Units (has no administration in time range)  insulin aspart (novoLOG) injection 0-5 Units (has no administration in time range)  albuterol (VENTOLIN HFA) 108 (90 Base) MCG/ACT inhaler 8 puff (8 puffs Inhalation Given 03/02/19 1132)  dexamethasone (DECADRON) injection 10 mg (10 mg Intravenous Given 03/02/19 1113)  sodium chloride 0.9 % bolus 500 mL (0 mLs Intravenous Stopped 03/02/19 1219)  sodium chloride 0.9 % bolus 500 mL (0 mLs Intravenous Stopped 03/02/19 1219)  ceFEPIme (MAXIPIME) 2 g in sodium chloride 0.9 % 100 mL IVPB (0 g Intravenous Stopped 03/02/19 1315)  sodium chloride 0.9 % bolus 1,000 mL (0 mLs Intravenous Stopped 03/02/19 1345)  sodium chloride 0.9 % bolus 500 mL (0 mLs Intravenous Stopped 03/02/19 1532)  vancomycin (VANCOCIN) IVPB 1000 mg/200 mL premix (0 mg Intravenous Stopped 03/02/19 1659)    Mobility walks     Focused Assessments    R Recommendations: See Admitting Provider Note  Report given to:   Additional Notes:

## 2019-03-02 NOTE — ED Notes (Signed)
Date and time results received: 03/02/19 1113  Test: Lactic Acid Critical Value: 3.1  Name of Provider Notified: Dr. Wyvonnia Dusky  Orders Received? Or Actions Taken?: See chart

## 2019-03-02 NOTE — ED Notes (Signed)
Family updated.

## 2019-03-02 NOTE — Progress Notes (Signed)
Pharmacy Antibiotic Note  Wayne Ortiz is a 73 y.o. male admitted on 03/02/2019 with pneumonia.  Pharmacy has been consulted for Vancomycin and Cefepime dosing.  Patient is also COVID (+) with remdesivir ordered  Plan: Vancomycin 2000 mg IV x 1 dose. Vancomycin 1000 mg IV every 12 hours.  Goal trough 15-20 mcg/mL.  Cefepime 2000 mg IV every 8 hours. Remdesivir 200 mg IV x 1 dose followed by remdesivir 100 mg IV daily on days 2-5.  Monitor labs, c/s, and vanco level as indicated.  Height: 6' (182.9 cm) Weight: 250 lb (113.4 kg) IBW/kg (Calculated) : 77.6  No data recorded.  Recent Labs  Lab 03/02/19 1038 03/02/19 1301  WBC 7.4  --   CREATININE 1.12  --   LATICACIDVEN 3.1* 3.4*    Estimated Creatinine Clearance: 75.2 mL/min (by C-G formula based on SCr of 1.12 mg/dL).    Allergies  Allergen Reactions  . Atorvastatin Other (See Comments)    Arthralgias  . Lisinopril Other (See Comments)    CP and SOB  . Metoprolol Other (See Comments)    CP and SOb  . Rocephin [Ceftriaxone Sodium In Dextrose] Rash    Antimicrobials this admission: Vanco 11/4 >>  Cefepime 11/4 >>     Microbiology results: 11/4 BCx: pending   11/4 MRSA PCR: pending 11/4 COVID: positive  Thank you for allowing pharmacy to be a part of this patient's care.  Ramond Craver 03/02/2019 2:25 PM

## 2019-03-02 NOTE — Progress Notes (Signed)
Pharmacy Note - Remdesivir Dosing  O:  ALT: 15 CXR: findings in the lungs which are nonspecific, but concerning for atypical infection, including potential viral pneumonia. Requiring supplemental O2: 15L   A/P:  Patient meets criteria for remdesivir.  Begin remdesivir 200 mg IV x 1, followed by 100 mg IV daily x 4 days  Monitor ALT, clinical progress  Peggyann Juba, PharmD, [place] [phone] 03/02/2019 8:24 PM

## 2019-03-02 NOTE — ED Notes (Signed)
Pt refusing to sit in bed, pt assisted to chair, Pt SOB getting up to chair. O2 remains on. Hospitalist made aware.

## 2019-03-02 NOTE — ED Notes (Signed)
IV in right forearm noted to be infiltrated. Dr Denton Brick called to room and notified, vancomycin infusing and IV infiltrated. Redness marked with skin marker, ice applied. Dr Denton Brick updated on pt.

## 2019-03-02 NOTE — H&P (Addendum)
Patient Demographics:    Wayne Ortiz, is a 73 y.o. male  MRN: [id number]   DOB - [birth year]  Admit Date - 03/02/2019  Outpatient Primary MD for the patient is Celene Squibb, MD   Assessment & Plan:    Principal Problem:   Acute Hypoxic Respiratory failure, acute due to COVID PNA Active Problems:   A-fib with RVR   Acute respiratory disease due to COVID-19 virus   Pneumonia due to COVID-19 virus   CAD S/P RCA and CFX DES Feb 2016   Chronic anticoagulation-Eliquis   Liver abscess- s/p JP drain   Essential hypertension    1)Acute hypoxic respiratory failure secondary to COVID-19 infection/pneumonia--- patient with severe hypoxia requiring nonrebreather bag - the treatment plan and use of medications  for treatment of COVID-19 infection and possible side effects were discussed with patient and his son (Wayne Ortiz)  explained that there is No proven definitive treatment for COVID-19 infection, any medications used here are based on published clinical articles/anecdotal data which at times and not yet peer-reviewed or randomized control trials. Complete risks and long-term side effects are unknown, however in the best clinical judgment they seem to be of some clinical benefit . --potential side effects of Remdesivir including, but not limited to allergic reaction, nausea, vomiting, elevated LFTs discussed with patient /family ,also discussed potential steroid side effect including elevated blood sugars, elevated blood pressure, psychosis/anxiety,  insomnia --Patient and his son verbalizes understanding and agrees to treatment protocols    --Patient is positive for COVID-19 infection, chest x-ray/CTA chest with findings of infiltrates/opacities,  patient is hypoxic and requiring continuous supplemental  oxygen---patient meets criteria for initiation of Remdesivir AND  Steroid therapy per protocol  --Elevated inflammatory markers noted including--LDH is 370, ferritin is 507,   D-dimer is 19.8, fibrinogen is over 800, C-reactive protein is 24 --Check and trend fibrinogen, CRP, pro calcitonin, CBC, BMP, d-dimer, LDH, ferritin and LFTs --Supplemental oxygen via NRB to keep O2 sats above 93% -Follow serial chest x-rays and ABGs as indicated --- Encourage prone positioning for More than 16 hours/day in increments of 2 to 3 hours at a time if able to tolerate --Attempt to maintain euvolemic state --Zinc and vitamin C as ordered -Albuterol inhaler as needed   2)Acute Hypoxic Respiratory Failure secondary to #1 above- chest x-ray-showing viral pneumonia --ABG with PO2 of 78.6 on nonrebreather bag with a PCO2 of 31.7 and a pH of 7.46 reflecting some degree of respiratory alkalosis from hyperventilation/tachypnea Review of lab reveals elevated inflammatory markers -EDP initially gave Vanco and cefepime and obtained cultures -No further iv antibiotics at this time, suspect Covid pneumonia treat as above #1 -Continue supplemental oxygen  3)Social/Ethics- Plan of care and CODE STATUS discussed with patient and son--- patient is a full code without limitations to treatment -Family Contact- Wayne Casto Mikel Ortiz-- [phone] or cell [phone]-  4)PAF--patient is back in A. fib with RVR most likely  secondary to #1 and #2 above--currently on IV amiodarone for rate control, already on Eliquis for stroke prophylaxis -PTA patient was on p.o. amiodarone  5)Liver abscess--status post percutaneous JP drain placement on 02/13/2019, previously treated with Rocephin and Flagyl, currently on p.o. Cipro through 03/14/2019 -Plan was for patient to have repeat imaging at [facility] with possible removal of JP drain -Elevated procalcitonin (0.34) may be related to liver abscess  6)HFpEF--last known EF 65% based on  echo from 2019, hold Lasix, in the setting of Covid euvolemia desired  7)CAD (05/2014 RCA & LCX PCI)-despite A. fib with RVR troponin is not elevated -No chest pain no ACS concerns at this time -PTA patient was not on aspirin most likely because he requires Eliquis for A. Fib -PTA patient was  not on beta-blockers or statins  8)Hypothyroidism- .  No recent TSH available, continue levothyroxine 150 mcg daily  9)Obesity and Mild OSA--- unable to use CPAP in the setting of COVID-19 pneumonia  With History of - Reviewed by me  Past Medical History:  Diagnosis Date  . Arthritis   . Atrial fibrillation ([name])   . BPH (benign prostatic hypertrophy)   . CAD (coronary artery disease)    a. s/p DES 05/2014 at [facility]  . Complication of anesthesia    BECAME COMBATIVE  . Dysrhythmia   . Femur fracture ([place])   . GERD (gastroesophageal reflux disease)    occasional TUMS  . Gout   . History of gout   . History of kidney stones   . Hypertension   . Hypothyroidism   . Persistent atrial fibrillation ([place])   . Precancerous skin lesion   . Renal disorder       Past Surgical History:  Procedure Laterality Date  . arm fracture surgery Left   . CARDIOVERSION [name] 12/09/2014   Procedure: CARDIOVERSION;  Surgeon: [name], MD;  Location: [place];  Service: Cardiovascular;  Laterality: [name];  . CARDIOVERSION [name] 05/05/2016   Procedure: CARDIOVERSION;  Surgeon: [name], MD;  Location: [place];  Service: Cardiovascular;  Laterality: [name];  . CARDIOVERSION [name] 08/22/2016   Procedure: CARDIOVERSION;  Surgeon: Lelon Perla, MD;  Location: [facility] ENDOSCOPY;  Service: Cardiovascular;  Laterality: [name];  . CATARACTS     EXCISION  . CERVICAL SPINE SURGERY    . CHOLECYSTECTOMY [name] 01/03/2013   Procedure: LAPAROSCOPIC CHOLECYSTECTOMY;  Surgeon: [name], MD;  Location: [name];  Service: General;  Laterality: [name];  . ELECTROPHYSIOLOGIC STUDY [name] 03/15/2015   Procedure: Atrial Fibrillation  Ablation;  Surgeon: [name], MD;  Location: [place] CV LAB;  Service: Cardiovascular;  Laterality: [name];  . ELECTROPHYSIOLOGIC STUDY [name] 04/01/2016   Procedure: Atrial Fibrillation Ablation;  Surgeon: [name], MD;  Location: [place] CV LAB;  Service: Cardiovascular;  Laterality: [name];  . ERCP [name] 01/02/2013   Procedure: ENDOSCOPIC RETROGRADE CHOLANGIOPANCREATOGRAPHY (ERCP);  Surgeon: [name], MD;  Location: [name];  Service: Endoscopy;  Laterality: [name];  . FEMUR FRACTURE SURGERY    . LIVER BIOPSY [name] 01/03/2013   Procedure: LIVER BIOPSY;  Surgeon: [name], MD;  Location: [name];  Service: General;  Laterality: [name];  . RIGHT/LEFT HEART CATH AND CORONARY ANGIOGRAPHY [name] 07/04/2016   Procedure: Right/Left Heart Cath and Coronary Angiography;  Surgeon: Belva Crome, MD;  Location: [place] CV LAB;  Service: Cardiovascular;  Laterality: [name];  . SPHINCTEROTOMY [name] 01/02/2013   Procedure: SPHINCTEROTOMY;  Surgeon: [name], MD;  Location: [facility];  Service: Endoscopy;  Laterality: [name];  Stone Extraction  . TEE WITHOUT CARDIOVERSION [name] 08/22/2016   Procedure: TRANSESOPHAGEAL ECHOCARDIOGRAM (TEE);  Surgeon: Lelon Perla, MD;  Location: [place];  Service: Cardiovascular;  Laterality: [name];  . TOTAL KNEE ARTHROPLASTY Right 05/30/2013   Procedure: RIGHT TOTAL KNEE ARTHROPLASTY;  Surgeon: [name], MD;  Location: [name];  Service: Orthopedics;  Laterality: Right;    Chief Complaint  Patient presents with  . Shortness of Breath      HPI:    Donathan Tomer  is a 73 y.o. male reformed smoker with past medical history relevant for PAF/atrial flutter (Hx multiple DCCV/ablationson amiodarone and chronic AC with Eliquis), s/p Ablation by [name] 2016 Afib ablation 04/01/2016, HFpEF (02/2018 EF 65%)and CAD (05/2014 RCA & LCX PCI), HTN, dyslipidemia, mild OSA.  HTN, HLD, mild OSA who was diagnosed with COVID-19 infection at [facility] on 02/21/2019- -chest x-ray  at [facility] at that time showed opacities, -Continue to be symptomatic with fevers, cough, shortness of breath and myalgias  -He was evaluated at [facility] on 02/27/2019 for similar symptoms in the setting of known positive COVID-19 test--- CTA chest on 02/27/2019 with Extensive bilateral peripheral predominant groundglass opacity, in  keeping with Covid19 infection.  --Please note that patient was recently treated for liver abscess with Rocephin and Flagyl,  - - Patient had percutaneous hepatic drain placed on 02/13/2019-- -we will supposed to complete Cipro on 03/14/2019  -Since being seen at [facility] on 02/27/2019 his shortness of breath and cough has progressively gotten worse - Today in the ED is found to be hypoxic with O2 sats in the 70s requiring nonrebreather bag -Is also found to be tachycardic but has a history of A. fib  -Review of lab reveals elevated inflammatory markers -EDP initially gave Vanco and cefepime and obtained cultures and chest x-ray-showing viral pneumonia --ABG with PO2 of 78.6 on nonrebreather bag with a PCO2 of 31.7 and a pH of 7.46 reflecting some degree of respiratory alkalosis from hyperventilation/tachypnea  -No leg pains,  or pleuritic symptoms  In ED labs show troponin is not elevated, -Lactic acid is 3.4--EDP gave IV fluids and IV Vanco and cefepime and obtained cultures -BNP 77 -LDH is 370, ferritin is 507, procalcitonin 0.34, D-dimer is 19.8, fibrinogen is over 800, C-reactive protein is 24 -   Review of systems:    In addition to the HPI above,   A full Review of  Systems was done, all other systems reviewed are negative except as noted above in HPI , .    Social History:  Reviewed by me    Social History   Tobacco Use  . Smoking status: Former Smoker    Quit date: 05/23/1973    Years since quitting: 45.8  . Smokeless tobacco: Never Used  Substance Use Topics  . Alcohol use: No    Alcohol/week: 0.0 standard drinks     Frequency: Never     Family History :  Reviewed by me    Family History  Problem Relation Age of Onset  . Kidney Stones Mother     Home Medications:   Prior to Admission medications   Medication Sig Start Date End Date Taking? Authorizing Provider  allopurinol (ZYLOPRIM) 300 MG tablet Take 300 mg by mouth every morning.     [provider]  amiodarone (PACERONE) 200 MG tablet TAKE 1 TABLET BY MOUTH EVERY DAY 04/13/18   Sherran Needs, NP  ciprofloxacin (CIPRO)  500 MG tablet Take 1 tablet (500 mg total) by mouth 2 (two) times daily. 02/06/19   Carmin Muskrat, MD  ELIQUIS 5 MG TABS tablet TAKE 1 TABLET BY MOUTH TWICE A DAY 02/03/18   Belva Crome, MD  furosemide (LASIX) 40 MG tablet TAKE ONE TABLET BY MOUTH DAILY,MAY TAKE EXTRA TAB DAILY AS NEEDED FOR WEIGHT GAIN/SWELLING 11/17/16   Sherran Needs, NP  HYDROcodone-acetaminophen (NORCO/VICODIN) 5-325 MG tablet Take 1 tablet by mouth every 6 (six) hours as needed for moderate pain.    [provider]  SYNTHROID 150 MCG tablet Take 150 mcg by mouth daily before breakfast.  11/03/14   [provider]     Allergies:     Allergies  Allergen Reactions  . Atorvastatin Other (See Comments)    Arthralgias  . Lisinopril Other (See Comments)    CP and SOB  . Metoprolol Other (See Comments)    CP and SOb  . Rocephin [Ceftriaxone Sodium In Dextrose] Rash     Physical Exam:   Vitals  Blood pressure 101/68, pulse 78, resp. rate (!) 29, height 6' (1.829 m), weight 113.4 kg, SpO2 98 %.  Physical Examination: General appearance - alert, ill- appearing, and in acute Resp distress with increased work of breathing Mental status - alert, oriented to person, place, and time,  Eyes - sclera anicteric Nose- NRB Neck - supple, no JVD elevation , Chest -diminished breath sounds with scattered wheezes and rhonchi bilaterally, tachypenic Heart - S1 and S2 normal, irregularly irregular and tachycardic Abdomen - soft,  nontender, nondistended, -right flank JP drain  neurological - screening mental status exam normal, neck supple without rigidity, cranial nerves Ortiz through XII intact, -Generalized weakness without new focal deficits, DTR's normal and symmetric Extremities -negative Homans, intact peripheral pulses Skin - warm, dry     Data Review:    CBC Recent Labs  Lab 03/02/19 1038  WBC 7.4  HGB 15.2  HCT 45.8  PLT 150  MCV 98.5  MCH 32.7  MCHC 33.2  RDW 14.1  LYMPHSABS 0.4*  MONOABS 0.6  EOSABS 0.0  BASOSABS 0.0   ------------------------------------------------------------------------------------------------------------------  Chemistries  Recent Labs  Lab 03/02/19 1038  NA 134*  K 4.1  CL 99  CO2 20*  GLUCOSE 103*  BUN 22  CREATININE 1.12  CALCIUM 8.0*  AST 32  ALT 15  ALKPHOS 44  BILITOT 0.7   ------------------------------------------------------------------------------------------------------------------ estimated creatinine clearance is 75.2 mL/min (by C-G formula based on SCr of 1.12 mg/dL). ------------------------------------------------------------------------------------------------------------------ No results for input(s): TSH, T4TOTAL, T3FREE, THYROIDAB in the last 72 hours.  Invalid input(s): FREET3   Coagulation profile No results for input(s): INR, PROTIME in the last 168 hours. ------------------------------------------------------------------------------------------------------------------- Recent Labs    03/02/19 1038  DDIMER 19.80*   -------------------------------------------------------------------------------------------------------------------  Cardiac Enzymes No results for input(s): CKMB, TROPONINI, MYOGLOBIN in the last 168 hours.  Invalid input(s): CK ------------------------------------------------------------------------------------------------------------------    Component Value Date/Time   BNP 77.0 03/02/2019 1046      ---------------------------------------------------------------------------------------------------------------  Urinalysis    Component Value Date/Time   COLORURINE AMBER (A) 02/06/2019 1502   APPEARANCEUR HAZY (A) 02/06/2019 1502   LABSPEC 1.021 02/06/2019 1502   PHURINE 5.0 02/06/2019 1502   GLUCOSEU NEGATIVE 02/06/2019 1502   HGBUR NEGATIVE 02/06/2019 1502   BILIRUBINUR NEGATIVE 02/06/2019 1502   KETONESUR NEGATIVE 02/06/2019 1502   PROTEINUR 30 (A) 02/06/2019 1502   UROBILINOGEN 0.2 05/23/2013 1033   NITRITE NEGATIVE 02/06/2019 [place] 02/06/2019 1502    ----------------------------------------------------------------------------------------------------------------  Imaging Results:    Dg Pelvis Portable  Result Date: 03/02/2019 CLINICAL DATA:  Right hip pain.  No injury. EXAM: PORTABLE PELVIS 1-2 VIEWS COMPARISON:  CT abdomen pelvis dated February 06, 2019. FINDINGS: No acute fracture or dislocation. Prior left proximal femur ORIF. Unchanged mild bilateral hip joint space narrowing with small marginal osteophytes. The pubic symphysis and sacroiliac joints are unremarkable. Lower lumbar spondylosis. Bone mineralization is normal. Soft tissues are unremarkable. IMPRESSION: 1.  No acute osseous abnormality. 2. Unchanged mild bilateral hip degenerative changes. Electronically Signed   By: Titus Dubin M.D.   On: 03/02/2019 11:27   Dg Chest Port 1 View  Result Date: 03/02/2019 CLINICAL DATA:  Severe shortness of breath. EXAM: PORTABLE CHEST 1 VIEW COMPARISON:  Chest x-ray dated February 06, 2019. FINDINGS: The heart size and mediastinal contours are within normal limits. Normal pulmonary vascularity. Patchy asymmetric peripheral opacities in the left greater than right mid to lower lungs. No pleural effusion or pneumothorax. Unchanged elevation of the right hemidiaphragm. No acute osseous abnormality. IMPRESSION: 1. There are findings in the lungs which are  nonspecific, but concerning for atypical infection, including potential viral pneumonia. Electronically Signed   By: Titus Dubin M.D.   On: 03/02/2019 11:25    Radiological Exams on Admission: Dg Pelvis Portable  Result Date: 03/02/2019 CLINICAL DATA:  Right hip pain.  No injury. EXAM: PORTABLE PELVIS 1-2 VIEWS COMPARISON:  CT abdomen pelvis dated February 06, 2019. FINDINGS: No acute fracture or dislocation. Prior left proximal femur ORIF. Unchanged mild bilateral hip joint space narrowing with small marginal osteophytes. The pubic symphysis and sacroiliac joints are unremarkable. Lower lumbar spondylosis. Bone mineralization is normal. Soft tissues are unremarkable. IMPRESSION: 1.  No acute osseous abnormality. 2. Unchanged mild bilateral hip degenerative changes. Electronically Signed   By: Titus Dubin M.D.   On: 03/02/2019 11:27   Dg Chest Port 1 View  Result Date: 03/02/2019 CLINICAL DATA:  Severe shortness of breath. EXAM: PORTABLE CHEST 1 VIEW COMPARISON:  Chest x-ray dated February 06, 2019. FINDINGS: The heart size and mediastinal contours are within normal limits. Normal pulmonary vascularity. Patchy asymmetric peripheral opacities in the left greater than right mid to lower lungs. No pleural effusion or pneumothorax. Unchanged elevation of the right hemidiaphragm. No acute osseous abnormality. IMPRESSION: 1. There are findings in the lungs which are nonspecific, but concerning for atypical infection, including potential viral pneumonia. Electronically Signed   By: Titus Dubin M.D.   On: 03/02/2019 11:25    DVT Prophylaxis -SCD /eliquis AM Labs Ordered, also please review Full Orders  Family Communication: Admission, patients condition and plan of care including tests being ordered have been discussed with the patient and son  who indicate understanding and agree with the plan   Code Status - Full Code  Likely --transfer to [facility]   Condition   stable  Roxan Hockey M.D on  03/02/2019 at 6:24 PM Go to [email or web address] -  for contact info  [facility] - Office  [phone]

## 2019-03-02 NOTE — ED Notes (Signed)
Date and time results received: 03/02/19 1405(use smartphrase ".now" to insert current time)  Test: Lactic Acid Critical Value: 3.4 Name of Provider Notified: Dr Wyvonnia Dusky Orders Received? Or Actions Taken?: NA

## 2019-03-02 NOTE — ED Provider Notes (Signed)
[facility] EMERGENCY DEPARTMENT Provider Note   CSN: [id number] Arrival date & time: 03/02/19  1020     History   Chief Complaint Chief Complaint  Patient presents with  . Shortness of Breath    HPI Wayne Ortiz is a 73 y.o. male.     Level 5 caveat for respiratory distress.  Patient brought in from PCPs office with hypoxia, shortness of breath and difficulty breathing.  He was diagnosed with coronavirus at [facility] on November 1.  States he cannot breathe he feels like he is going to die.  Unable to give much of a history due to respiratory distress but denies any history of COPD or asthma.  States he has had a cough productive of green and yellow mucus.  Has had fevers and chills at home and some intermittent diarrhea.  Complaining of right hip pain which is chronic.  Denies any recent falls. Denies any history of COPD or asthma. Has JP drain in place for possible liver gallbladder infection.  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 ID recommended continuation of IV ceftriaxone and po metronidazole with EOT 11/16.. Will need repeat imaging and follow-up with infectious disease in the outpatient setting; drain to removed in ID clinic.    A. fib on Eliquis, heart failure with reduced ejection fraction, CAD status post PCI x2, hypertension, HLD, mild OSA who presents for SOB  Initial positive Covid test appears to be on October 26  The history is provided by the patient. The history is limited by the condition of the patient. No language interpreter was used.  Shortness of Breath   Past Medical History:  Diagnosis Date  . Arthritis   . Atrial fibrillation ([place])   . BPH (benign prostatic hypertrophy)   . CAD (coronary artery disease)    a. s/p DES 05/2014 at [facility]  . Complication of anesthesia    BECAME COMBATIVE  . Dysrhythmia   . Femur fracture ([place])   . GERD (gastroesophageal reflux disease)    occasional TUMS  . Gout   . History of gout   . History of kidney  stones   . Hypertension   . Hypothyroidism   . Persistent atrial fibrillation ([place])   . Precancerous skin lesion   . Renal disorder     Patient Active Problem List   Diagnosis Date Noted  . A-fib ([place]) 03/15/2015  . Essential hypertension 12/09/2014  . Morbid obesity-BMI 37 12/09/2014  . Hypothyroidism 12/09/2014  . Dyspnea 12/09/2014  . Chronic anticoagulation-Eliquis 12/09/2014  . Persistent atrial fibrillation ([place]) 12/07/2014  . Chest pain 09/13/2014  . Palpitations 09/13/2014  . CAD S/P RCA and CFX DES Feb 2016 09/13/2014  . Coronary artery abnormality 06/22/2014  . Presence of stent in right coronary artery 06/22/2014  . Presence of coronary angioplasty implant and graft 06/22/2014  . Abnormal cardiovascular function study 06/13/2014  . Postoperative stiffness of total knee replacement ([place]) 06/21/2013  . Pain in right knee 06/21/2013  . Difficulty in walking(719.7) 06/21/2013  . S/P right TKA 05/30/2013  . Abdominal pain, right upper quadrant 12/31/2012  . Abnormal transaminases 12/31/2012  . Thrombocytopenia, unspecified ([place]) 12/31/2012    Past Surgical History:  Procedure Laterality Date  . arm fracture surgery Left   . CARDIOVERSION [name] 12/09/2014   Procedure: CARDIOVERSION;  Surgeon: [name], MD;  Location: [facility] OR;  Service: Cardiovascular;  Laterality: [name];  . CARDIOVERSION [name] 05/05/2016   Procedure: CARDIOVERSION;  Surgeon: [name], MD;  Location: [place];  Service: Cardiovascular;  Laterality: [name];  . CARDIOVERSION [name] 08/22/2016   Procedure: CARDIOVERSION;  Surgeon: Lelon Perla, MD;  Location: [facility] ENDOSCOPY;  Service: Cardiovascular;  Laterality: [name];  . CATARACTS     EXCISION  . CERVICAL SPINE SURGERY    . CHOLECYSTECTOMY [name] 01/03/2013   Procedure: LAPAROSCOPIC CHOLECYSTECTOMY;  Surgeon: [name], MD;  Location: [name];  Service: General;  Laterality: [name];  . ELECTROPHYSIOLOGIC STUDY [name] 03/15/2015   Procedure: Atrial  Fibrillation Ablation;  Surgeon: [name], MD;  Location: [place] CV LAB;  Service: Cardiovascular;  Laterality: [name];  . ELECTROPHYSIOLOGIC STUDY [name] 04/01/2016   Procedure: Atrial Fibrillation Ablation;  Surgeon: [name], MD;  Location: [place] CV LAB;  Service: Cardiovascular;  Laterality: [name];  . ERCP [name] 01/02/2013   Procedure: ENDOSCOPIC RETROGRADE CHOLANGIOPANCREATOGRAPHY (ERCP);  Surgeon: [name], MD;  Location: [name];  Service: Endoscopy;  Laterality: [name];  . FEMUR FRACTURE SURGERY    . LIVER BIOPSY [name] 01/03/2013   Procedure: LIVER BIOPSY;  Surgeon: [name], MD;  Location: [name];  Service: General;  Laterality: [name];  . RIGHT/LEFT HEART CATH AND CORONARY ANGIOGRAPHY [name] 07/04/2016   Procedure: Right/Left Heart Cath and Coronary Angiography;  Surgeon: Belva Crome, MD;  Location: [place] CV LAB;  Service: Cardiovascular;  Laterality: [name];  . SPHINCTEROTOMY [name] 01/02/2013   Procedure: SPHINCTEROTOMY;  Surgeon: [name], MD;  Location: [name];  Service: Endoscopy;  Laterality: [name];  Stone Extraction  . TEE WITHOUT CARDIOVERSION [name] 08/22/2016   Procedure: TRANSESOPHAGEAL ECHOCARDIOGRAM (TEE);  Surgeon: Lelon Perla, MD;  Location: [place];  Service: Cardiovascular;  Laterality: [name];  . TOTAL KNEE ARTHROPLASTY Right 05/30/2013   Procedure: RIGHT TOTAL KNEE ARTHROPLASTY;  Surgeon: [name], MD;  Location: [name];  Service: Orthopedics;  Laterality: Right;        Home Medications    Prior to Admission medications   Medication Sig Start Date End Date Taking? Authorizing Provider  allopurinol (ZYLOPRIM) 300 MG tablet Take 300 mg by mouth every morning.     [provider]  amiodarone (PACERONE) 200 MG tablet TAKE 1 TABLET BY MOUTH EVERY DAY 04/13/18   Sherran Needs, NP  ciprofloxacin (CIPRO) 500 MG tablet Take 1 tablet (500 mg total) by mouth 2 (two) times daily. 02/06/19   Carmin Muskrat, MD  ELIQUIS 5 MG TABS tablet TAKE 1  TABLET BY MOUTH TWICE A DAY 02/03/18   Belva Crome, MD  furosemide (LASIX) 40 MG tablet TAKE ONE TABLET BY MOUTH DAILY,MAY TAKE EXTRA TAB DAILY AS NEEDED FOR WEIGHT GAIN/SWELLING 11/17/16   Sherran Needs, NP  HYDROcodone-acetaminophen (NORCO/VICODIN) 5-325 MG tablet Take 1 tablet by mouth every 6 (six) hours as needed for moderate pain.    [provider]  SYNTHROID 150 MCG tablet Take 150 mcg by mouth daily before breakfast.  11/03/14   [provider]    Family History Family History  Problem Relation Age of Onset  . Kidney Stones Mother     Social History Social History   Tobacco Use  . Smoking status: Former Smoker    Quit date: 05/23/1973    Years since quitting: 45.8  . Smokeless tobacco: Never Used  Substance Use Topics  . Alcohol use: No    Alcohol/week: 0.0 standard drinks    Frequency: Never  . Drug use: No     Allergies   Patient has no known allergies.  Review of Systems Review of Systems  Unable to perform ROS: Acuity of condition  Respiratory: Positive for shortness of breath.      Physical Exam Updated Vital Signs Pulse (!) 134   Resp (!) 30   SpO2 (!) 74% Comment: 91% on NRB  Physical Exam Vitals signs and nursing note reviewed.  Constitutional:      General: He is in acute distress.     Appearance: He is well-developed. He is obese. He is ill-appearing.     Comments: Respiratory distress, speaking in short sentences, tachypneic to the 30s, irregular tachycardia  HENT:     Head: Normocephalic and atraumatic.     Mouth/Throat:     Pharynx: No oropharyngeal exudate.  Eyes:     Conjunctiva/sclera: Conjunctivae normal.     Pupils: Pupils are equal, round, and reactive to light.  Neck:     Musculoskeletal: Normal range of motion and neck supple.     Comments: No meningismus. Cardiovascular:     Rate and Rhythm: Tachycardia present. Rhythm irregular.     Heart sounds: Normal heart sounds. No murmur.  Pulmonary:     Effort:  Respiratory distress present.     Breath sounds: Rhonchi present.     Comments: Rhonchi bilaterally Abdominal:     Palpations: Abdomen is soft.     Tenderness: There is no abdominal tenderness. There is no guarding or rebound.     Comments: JP drain in place  Musculoskeletal: Normal range of motion.        General: No tenderness.     Right lower leg: No edema.     Left lower leg: No edema.  Skin:    General: Skin is warm.  Neurological:     Mental Status: He is alert and oriented to person, place, and time.     Cranial Nerves: No cranial nerve deficit.     Motor: No abnormal muscle tone.     Coordination: Coordination normal.     Comments:  5/5 strength throughout. CN 2-12 intact.Equal grip strength.   Psychiatric:        Behavior: Behavior normal.      ED Treatments / Results  Labs (all labs ordered are listed, but only abnormal results are displayed) Labs Reviewed  SARS CORONAVIRUS 2 BY RT PCR ([place]) - Abnormal; Notable for the following components:      Result Value   SARS Coronavirus 2 POSITIVE (*)    All other components within normal limits  LACTIC ACID, PLASMA - Abnormal; Notable for the following components:   Lactic Acid, Venous 3.1 (*)    All other components within normal limits  LACTIC ACID, PLASMA - Abnormal; Notable for the following components:   Lactic Acid, Venous 3.4 (*)    All other components within normal limits  CBC WITH DIFFERENTIAL/PLATELET - Abnormal; Notable for the following components:   Lymphs Abs 0.4 (*)    All other components within normal limits  COMPREHENSIVE METABOLIC PANEL - Abnormal; Notable for the following components:   Sodium 134 (*)    CO2 20 (*)    Glucose, Bld 103 (*)    Calcium 8.0 (*)    Albumin 3.0 (*)    All other components within normal limits  D-DIMER, QUANTITATIVE (NOT AT [facility]) - Abnormal; Notable for the following components:   D-Dimer, Quant 19.80 (*)    All other  components within normal limits  LACTATE DEHYDROGENASE - Abnormal; Notable for the  following components:   LDH 370 (*)    All other components within normal limits  FERRITIN - Abnormal; Notable for the following components:   Ferritin 507 (*)    All other components within normal limits  FIBRINOGEN - Abnormal; Notable for the following components:   Fibrinogen >800 (*)    All other components within normal limits  C-REACTIVE PROTEIN - Abnormal; Notable for the following components:   CRP 24 (*)    All other components within normal limits  BLOOD GAS, ARTERIAL - Abnormal; Notable for the following components:   pH, Arterial 7.461 (*)    pCO2 arterial 31.7 (*)    pO2, Arterial 78.6 (*)    All other components within normal limits  CULTURE, BLOOD (ROUTINE X 2)  CULTURE, BLOOD (ROUTINE X 2)  MRSA PCR SCREENING  PROCALCITONIN  TRIGLYCERIDES  BRAIN NATRIURETIC PEPTIDE  TROPONIN I (HIGH SENSITIVITY)  TROPONIN I (HIGH SENSITIVITY)    EKG EKG Interpretation  Date/Time:  Wednesday March 02 2019 10:45:40 EST Ventricular Rate:  142 PR Interval:    QRS Duration: 95 QT Interval:  317 QTC Calculation: 488 R Axis:   -78 Text Interpretation: Sinus or ectopic atrial tachycardia Left axis deviation Low voltage, precordial leads Anteroseptal infarct, age indeterminate Minimal ST depression, inferior leads Nonspecific ST abnormality rate faster\ Confirmed by Ezequiel Essex [phone]) on 03/02/2019 11:05:13 AM   Radiology Dg Pelvis Portable  Result Date: 03/02/2019 CLINICAL DATA:  Right hip pain.  No injury. EXAM: PORTABLE PELVIS 1-2 VIEWS COMPARISON:  CT abdomen pelvis dated February 06, 2019. FINDINGS: No acute fracture or dislocation. Prior left proximal femur ORIF. Unchanged mild bilateral hip joint space narrowing with small marginal osteophytes. The pubic symphysis and sacroiliac joints are unremarkable. Lower lumbar spondylosis. Bone mineralization is normal. Soft tissues are  unremarkable. IMPRESSION: 1.  No acute osseous abnormality. 2. Unchanged mild bilateral hip degenerative changes. Electronically Signed   By: Titus Dubin M.D.   On: 03/02/2019 11:27   Dg Chest Port 1 View  Result Date: 03/02/2019 CLINICAL DATA:  Severe shortness of breath. EXAM: PORTABLE CHEST 1 VIEW COMPARISON:  Chest x-ray dated February 06, 2019. FINDINGS: The heart size and mediastinal contours are within normal limits. Normal pulmonary vascularity. Patchy asymmetric peripheral opacities in the left greater than right mid to lower lungs. No pleural effusion or pneumothorax. Unchanged elevation of the right hemidiaphragm. No acute osseous abnormality. IMPRESSION: 1. There are findings in the lungs which are nonspecific, but concerning for atypical infection, including potential viral pneumonia. Electronically Signed   By: Titus Dubin M.D.   On: 03/02/2019 11:25    Procedures .Critical Care Performed by: Ezequiel Essex, MD Authorized by: Ezequiel Essex, MD   Critical care provider statement:    Critical care time (minutes):  60   Critical care was necessary to treat or prevent imminent or life-threatening deterioration of the following conditions:  Respiratory failure, shock and cardiac failure   Critical care was time spent personally by me on the following activities:  Discussions with consultants, evaluation of patient's response to treatment, examination of patient, ordering and performing treatments and interventions, ordering and review of laboratory studies, ordering and review of radiographic studies, pulse oximetry, re-evaluation of patient's condition, obtaining history from patient or surrogate and review of old charts   (including critical care time)  Medications Ordered in ED Medications  albuterol (VENTOLIN HFA) 108 (90 Base) MCG/ACT inhaler 8 puff (has no administration in time range)  dexamethasone (DECADRON) injection 10 mg (  has no administration in time range)      Initial Impression / Assessment and Plan / ED Course  I have reviewed the triage vital signs and the nursing notes.  Pertinent labs & imaging results that were available during my care of the patient were reviewed by me and considered in my medical decision making (see chart for details).       Shortness of breath and respiratory distress with recent diagnosis of coronavirus 3 days ago.  Hypoxic to 74% on arrival.  Patient with a irregular tachycardia.  He is placed on supplemental oxygen, given IV Decadron, bronchodilators.   Records reviewed and he had a negative CT angiogram for pulmonary embolism in November 1.  States compliance with his Eliquis.  Sepsis protocol initiated and broad spectrum antibiotics given after cultures obtained. Judicious fluids due to work of breathing and hypotension.  Lactate elevated  Patient HR 110s to 130s.  Blood pressure has been difficult to measure and is up and down as well in the 80s to 90s up to 120s.  Unclear whether his A. fib with RVR is driving his hypotension.  Patient denies any missed doses of his Eliquis.  Discussed with Dr. Domenic Polite cardiology who does not feel like his A. fib could be driving his hypotension who does not recommend emergent cardioversion.  Agrees with amiodarone and blood pressure support with fluids.  WOrk of breathing and BP improving. Patient mentating well. tachypneic on NRB, ABG without significant CO2 retention.  Patient would want to be intubated if necessary.  Will need admission for hypoxic respiratory failure due to COVID 19. D-dimer and inflammatory markers elevated. CTPE was negative 3 days ago and patient is on eliquis. Limited value to repeat CT at this time.  D/w Dr. Fredric Dine who accepts patient for admission and agrees.  D/w patient [facility]. ASH KOSIOR was evaluated in Emergency Department on 03/02/2019 for the symptoms described in the history of present illness. He was  evaluated in the context of the global COVID-19 pandemic, which necessitated consideration that the patient might be at risk for infection with the SARS-CoV-2 virus that causes COVID-19. Institutional protocols and algorithms that pertain to the evaluation of patients at risk for COVID-19 are in a state of rapid change based on information released by regulatory bodies including the CDC and federal and state organizations. These policies and algorithms were followed during the patient's care in the ED.  Final Clinical Impressions(s) / ED Diagnoses   Final diagnoses:  COVID-19 virus infection  Acute respiratory failure with hypoxia [facility])    ED Discharge Orders    None       Ezequiel Essex, MD 03/02/19 1705

## 2019-03-03 ENCOUNTER — Inpatient Hospital Stay (HOSPITAL_COMMUNITY): Payer: Medicare HMO

## 2019-03-03 ENCOUNTER — Encounter (HOSPITAL_COMMUNITY): Payer: Self-pay

## 2019-03-03 DIAGNOSIS — J1289 Other viral pneumonia: Secondary | ICD-10-CM

## 2019-03-03 DIAGNOSIS — K750 Abscess of liver: Secondary | ICD-10-CM

## 2019-03-03 DIAGNOSIS — I4819 Other persistent atrial fibrillation: Secondary | ICD-10-CM

## 2019-03-03 DIAGNOSIS — Z7901 Long term (current) use of anticoagulants: Secondary | ICD-10-CM

## 2019-03-03 DIAGNOSIS — J9601 Acute respiratory failure with hypoxia: Secondary | ICD-10-CM

## 2019-03-03 DIAGNOSIS — R7989 Other specified abnormal findings of blood chemistry: Secondary | ICD-10-CM

## 2019-03-03 LAB — CBC WITH DIFFERENTIAL/PLATELET
Abs Immature Granulocytes: 0.03 10*3/uL (ref 0.00–0.07)
Basophils Absolute: 0 10*3/uL (ref 0.0–0.1)
Basophils Relative: 0 %
Eosinophils Absolute: 0 10*3/uL (ref 0.0–0.5)
Eosinophils Relative: 0 %
HCT: 40.2 % (ref 39.0–52.0)
Hemoglobin: 13.1 g/dL (ref 13.0–17.0)
Immature Granulocytes: 1 %
Lymphocytes Relative: 6 %
Lymphs Abs: 0.4 10*3/uL — ABNORMAL LOW (ref 0.7–4.0)
MCH: 32 pg (ref 26.0–34.0)
MCHC: 32.6 g/dL (ref 30.0–36.0)
MCV: 98 fL (ref 80.0–100.0)
Monocytes Absolute: 0.4 10*3/uL (ref 0.1–1.0)
Monocytes Relative: 7 %
Neutro Abs: 5.1 10*3/uL (ref 1.7–7.7)
Neutrophils Relative %: 86 %
Platelets: 102 10*3/uL — ABNORMAL LOW (ref 150–400)
RBC: 4.1 MIL/uL — ABNORMAL LOW (ref 4.22–5.81)
RDW: 14.3 % (ref 11.5–15.5)
WBC: 6 10*3/uL (ref 4.0–10.5)
nRBC: 0 % (ref 0.0–0.2)

## 2019-03-03 LAB — COMPREHENSIVE METABOLIC PANEL
ALT: 11 U/L (ref 0–44)
AST: 26 U/L (ref 15–41)
Albumin: 2.7 g/dL — ABNORMAL LOW (ref 3.5–5.0)
Alkaline Phosphatase: 38 U/L (ref 38–126)
Anion gap: 12 (ref 5–15)
BUN: 22 mg/dL (ref 8–23)
CO2: 21 mmol/L — ABNORMAL LOW (ref 22–32)
Calcium: 6.9 mg/dL — ABNORMAL LOW (ref 8.9–10.3)
Chloride: 100 mmol/L (ref 98–111)
Creatinine, Ser: 0.99 mg/dL (ref 0.61–1.24)
GFR calc Af Amer: >60 mL/min (ref >60)
GFR calc non Af Amer: >60 mL/min (ref >60)
Glucose, Bld: 127 mg/dL — ABNORMAL HIGH (ref 70–99)
Potassium: 4.4 mmol/L (ref 3.5–5.1)
Sodium: 133 mmol/L — ABNORMAL LOW (ref 135–145)
Total Bilirubin: 0.4 mg/dL (ref 0.3–1.2)
Total Protein: 6.4 g/dL — ABNORMAL LOW (ref 6.5–8.1)

## 2019-03-03 LAB — FERRITIN: Ferritin: 493 ng/mL — ABNORMAL HIGH (ref 24–336)

## 2019-03-03 LAB — C-REACTIVE PROTEIN: CRP: 19.9 mg/dL — ABNORMAL HIGH (ref <1.0)

## 2019-03-03 LAB — GLUCOSE, CAPILLARY
Glucose-Capillary: 128 mg/dL — ABNORMAL HIGH (ref 70–99)
Glucose-Capillary: 136 mg/dL — ABNORMAL HIGH (ref 70–99)
Glucose-Capillary: 114 mg/dL — ABNORMAL HIGH (ref 70–99)

## 2019-03-03 LAB — D-DIMER, QUANTITATIVE: D-Dimer, Quant: >20 ug/mL-FEU — ABNORMAL HIGH (ref 0.00–0.50)

## 2019-03-03 LAB — PHOSPHORUS: Phosphorus: 2.9 mg/dL (ref 2.5–4.6)

## 2019-03-03 LAB — MAGNESIUM: Magnesium: 2.2 mg/dL (ref 1.7–2.4)

## 2019-03-03 LAB — ABO/RH: ABO/RH(D): A POS

## 2019-03-03 MED ORDER — FUROSEMIDE 10 MG/ML IJ SOLN
40.0000 mg | Freq: Two times a day (BID) | INTRAMUSCULAR | Status: DC
  Administered 2019-03-03 – 2019-03-04 (×4): 40 mg via INTRAVENOUS
  Filled 2019-03-03 (×4): qty 4

## 2019-03-03 MED ORDER — LEVOTHYROXINE SODIUM 137 MCG PO TABS
137.0000 ug | ORAL_TABLET | Freq: Every day | ORAL | Status: DC
  Administered 2019-03-04 – 2019-03-11 (×8): 137 ug via ORAL
  Filled 2019-03-03 (×9): qty 1

## 2019-03-03 MED ORDER — DEXAMETHASONE SODIUM PHOSPHATE 10 MG/ML IJ SOLN
6.0000 mg | INTRAMUSCULAR | Status: DC
  Administered 2019-03-03 – 2019-03-09 (×7): 6 mg via INTRAVENOUS
  Filled 2019-03-03 (×7): qty 1

## 2019-03-03 MED ORDER — METRONIDAZOLE 500 MG PO TABS
500.0000 mg | ORAL_TABLET | Freq: Three times a day (TID) | ORAL | Status: DC
  Administered 2019-03-03 – 2019-03-11 (×24): 500 mg via ORAL
  Filled 2019-03-03 (×29): qty 1

## 2019-03-03 MED ORDER — SODIUM CHLORIDE 0.9% IV SOLUTION
Freq: Once | INTRAVENOUS | Status: AC
  Administered 2019-03-03: 17:00:00 via INTRAVENOUS

## 2019-03-03 MED ORDER — CIPROFLOXACIN HCL 500 MG PO TABS: 500.0000 mg | ORAL_TABLET | Freq: Two times a day (BID) | ORAL | Status: DC

## 2019-03-03 MED ORDER — ALBUTEROL SULFATE HFA 108 (90 BASE) MCG/ACT IN AERS
2.0000 | INHALATION_SPRAY | RESPIRATORY_TRACT | Status: DC | PRN
  Filled 2019-03-03: qty 6.7

## 2019-03-03 NOTE — Plan of Care (Signed)
  Problem: Activity: °Goal: Ability to tolerate increased activity will improve °Outcome: Progressing °  °Problem: Clinical Measurements: °Goal: Ability to maintain a body temperature in the normal range will improve °Outcome: Progressing °  °Problem: Respiratory: °Goal: Ability to maintain adequate ventilation will improve °Outcome: Progressing °Goal: Ability to maintain a clear airway will improve °Outcome: Progressing °  °Problem: Education: °Goal: Knowledge of risk factors and measures for prevention of condition will improve °Outcome: Progressing °  °Problem: Coping: °Goal: Psychosocial and spiritual needs will be supported °Outcome: Progressing °  °Problem: Respiratory: °Goal: Will maintain a patent airway °Outcome: Progressing °Goal: Complications related to the disease process, condition or treatment will be avoided or minimized °Outcome: Progressing °  °

## 2019-03-03 NOTE — Progress Notes (Signed)
Pt currently sitting at bedside with no s/s of distress noted.

## 2019-03-03 NOTE — Progress Notes (Signed)
Venous duplex lower ext.  has been completed. Refer to [facility] under chart review to view preliminary results.   03/03/2019  4:23 PM Torrey Horseman, Bonnye Fava

## 2019-03-03 NOTE — Progress Notes (Signed)
Wayne Ortiz  [id number] DOB: [birth year] DOA: 03/02/2019 PCP: Celene Squibb, MD    Brief Narrative:  73 year old with a history of tobacco abuse, recent hepatic abscess status post percutaneous drainage, paroxysmal atrial fibrillation/flutter, status post ablation 2016, CAD status post PCI x2 vessels, HTN, HLD, and OSA who was diagnosed as Covid positive at [facility] 10/26 at which time a chest x-ray noted opacities bilaterally.  He was reevaluated at [facility] 11/1 at which time a CTA of the chest noted extensive bilateral peripheral predominant groundglass opacities consistent with Covid pneumonia.  Since the time of his evaluation 11/1 the patient reported that his shortness of breath has gradually and consistently worsened.  [facility] to the [facility] 11/4 at which time he was found to have a saturation of 70% on room air.  He was also tachycardic in atrial fibrillation.  Significant Events: 10/26 Covid positive at [facility] 11/1 CTA chest [place] consistent with Covid pneumonitis 11/4 admit to [facility] via [facility]  COVID-19 specific Treatment: Remdesivir 11/4 > Decadron 11/4 > Convalescent plasma 11/5  Subjective: The patient is awake alert and conversant.  He rapidly desaturates with the slightest of exertion but is able to recover and his respirations are calm when not exerting himself.  He is requiring salter high flow nasal cannula as well as nonrebreather, both maxed out.  He denies chest pain nausea vomiting or abdominal pain.  He and I spoke at great length about our treatment regimens here at [facility].  I discussed with him the 2 treatment options in addition to those he is already receiving.  I advised against Actemra given his recent hepatic abscess.  We discussed the pros and cons of convalescent plasma transfusion to include the fact that it is currently considered an experimental treatment.  I discussed the possible negative reactions/adverse effects.  He voiced understanding but did give  consent to proceed with convalescent plasma transfusion.  Assessment & Plan:  Covid pneumonia - acute hypoxic respiratory failure Requiring substantial oxygen support with both salter high flow as well as nonrebreather -low threshold to move to ICU for heated high flow if declines further -transfuse convalescent plasma today -attempt to diurese  Recent Labs  Lab 03/02/19 1038 03/03/19 0155  DDIMER 19.80* >20.00*  FERRITIN 507* 493*  CRP 24* 19.9*  ALT 15 11  PROCALCITON 0.34  --     Markedly elevated D-dimer Is already fully anticoagulated with Eliquis -check venous duplex to rule out DVT  Chronic paroxysmal atrial fibrillation with acute RVR Chronically on Eliquis and amiodarone -heart rate now well controlled -monitor on telemetry  Hepatic abscess Status post percutaneous drain placement 10/18 -treated with Rocephin and Flagyl -was to have repeat imaging at [facility] with consideration being given to removing his drain at that time -drain appears benign at present -continue Rocephin and Flagyl as per documentation in care everywhere  CAD - PCI to RCA and LCx February 2016  Hypothyroidism Continue home Synthroid dose  Obesity - Estimated body mass index is 33.91 kg/m as calculated from the following:   Height as of this encounter: 6' (1.829 m).   Weight as of this encounter: 113.4 kg.  OSA  DVT prophylaxis: Eliquis Code Status: FULL CODE Family Communication:  Disposition Plan:   Consultants:  none  Antimicrobials:  Cefepime 11/4 Ciprofloxacin 11/4 > Vancomycin 11/4 Rocephin 2 g IV daily >11/16 per [facility] Flagyl 500 mg p.o. 3 times daily >11/16 per [facility]  Objective: Blood pressure 118/72, pulse 74,  temperature 98.1 F (36.7 C), temperature source Oral, resp. rate (!) 28, height 6' (1.829 m), weight 113.4 kg, SpO2 90 %.  Intake/Output Summary (Last 24 hours) at 03/03/2019 0809 Last data filed at 03/03/2019 0600 Gross per 24 hour  Intake 3689.73 ml  Output 750 ml   Net 2939.73 ml   Filed Weights   03/02/19 1229  Weight: 113.4 kg    Examination: General: Moderate respiratory distress Lungs: Fine diffuse crackles Cardiovascular: Irregularly irregular with rate controlled without murmur Abdomen: Obese, soft, bowel sounds positive, no rebound Extremities: 1+ bilateral lower extremity edema  CBC: Recent Labs  Lab 03/02/19 1038 03/03/19 0155  WBC 7.4 6.0  NEUTROABS 6.3 5.1  HGB 15.2 13.1  HCT 45.8 40.2  MCV 98.5 98.0  PLT 150 [id number]*   Basic Metabolic Panel: Recent Labs  Lab 03/02/19 1038 03/03/19 0155  NA 134* 133*  K 4.1 4.4  CL 99 100  CO2 20* 21*  GLUCOSE 103* 127*  BUN 22 22  CREATININE 1.12 0.99  CALCIUM 8.0* 6.9*  MG  --  2.2  PHOS  --  2.9   GFR: Estimated Creatinine Clearance: 85.1 mL/min (by C-G formula based on SCr of 0.99 mg/dL).  Liver Function Tests: Recent Labs  Lab 03/02/19 1038 03/03/19 0155  AST 32 26  ALT 15 11  ALKPHOS 44 38  BILITOT 0.7 0.4  PROT 7.2 6.4*  ALBUMIN 3.0* 2.7*    HbA1C: Hgb A1c MFr Bld  Date/Time Value Ref Range Status  08/08/2014 07:11 AM 5.8 (H) <5.7 % Final    Comment:                                                                           According to the ADA Clinical Practice Recommendations for 2011, when HbA1c is used as a screening test:     >=6.5%   Diagnostic of Diabetes Mellitus            (if abnormal result is confirmed)   5.7-6.4%   Increased risk of developing Diabetes Mellitus   References:Diagnosis and Classification of Diabetes Mellitus,Diabetes [id number] 1):S62-S69 and Standards of Medical Care in         Diabetes - 2011,Diabetes Care,2011,34 (Suppl 1):S11-S61.       CBG: Recent Labs  Lab 03/02/19 2108  GLUCAP 126*    Recent Results (from the past 240 hour(s))  SARS Coronavirus 2 by RT PCR (hospital order, performed in [facility] hospital lab) Nasopharyngeal Nasopharyngeal Swab     Status: Abnormal   Collection Time: 03/02/19 11:08  AM   Specimen: Nasopharyngeal Swab  Result Value Ref Range Status   SARS Coronavirus 2 POSITIVE (A) NEGATIVE Final    Comment: RESULT CALLED TO, READ BACK BY AND VERIFIED WITH: R HOLCOLMB AT 1410 BY HFLYNT 03/02/19 (NOTE) If result is NEGATIVE SARS-CoV-2 target nucleic acids are NOT DETECTED. The SARS-CoV-2 RNA is generally detectable in upper and lower  respiratory specimens during the acute phase of infection. The lowest  concentration of SARS-CoV-2 viral copies this assay can detect is 250  copies / mL. A negative result does not preclude SARS-CoV-2 infection  and should not be used as the sole basis for treatment or other  patient management decisions.  A negative result may occur with  improper specimen collection / handling, submission of specimen other  than nasopharyngeal swab, presence of viral mutation(s) within the  areas targeted by this assay, and inadequate number of viral copies  (<250 copies / mL). A negative result must be combined with clinical  observations, patient history, and epidemiological information. If result is POSITIVE SARS-CoV-2 target nucleic acids are DETECTED.  The SARS-CoV-2 RNA is generally detectable in upper and lower  respiratory specimens during the acute phase of infection.  Positive  results are indicative of active infection with SARS-CoV-2.  Clinical  correlation with patient history and other diagnostic information is  necessary to determine patient infection status.  Positive results do  not rule out bacterial infection or co-infection with other viruses. If result is PRESUMPTIVE POSTIVE SARS-CoV-2 nucleic acids MAY BE PRESENT.   A presumptive positive result was obtained on the submitted specimen  and confirmed on repeat testing.  While 2019 novel coronavirus  (SARS-CoV-2) nucleic acids may be present in the submitted sample  additional confirmatory testing may be necessary for epidemiological  and / or clinical management purposes  to  differentiate between  SARS-CoV-2 and other Sarbecovirus currently known to infect humans.  If clinically indicated additional testing with an alternate test  methodology [phone])  is advised. The SARS-CoV-2 RNA is generally  detectable in upper and lower respiratory specimens during the acute  phase of infection. The expected result is Negative. Fact Sheet for Patients:  StrictlyIdeas.no Fact Sheet for Healthcare Providers: BankingDealers.co.za This test is not yet approved or cleared by the [place] FDA and has been authorized for detection and/or diagnosis of SARS-CoV-2 by FDA under an Emergency Use Authorization (EUA).  This EUA will remain in effect (meaning this test can be used) for the duration of the COVID-19 declaration under Section 564(b)(1) of the Act, 21 U.S.C. section 360bbb-3(b)(1), unless the authorization is terminated or revoked sooner. Performed at [facility], [address], [place], [place] [address]   Blood Culture (routine x 2)     Status: None (Preliminary result)   Collection Time: 03/02/19 11:37 AM   Specimen: BLOOD LEFT ARM  Result Value Ref Range Status   Specimen Description BLOOD LEFT ARM  Final   Special Requests   Final    BOTTLES DRAWN AEROBIC AND ANAEROBIC Blood Culture adequate volume   Culture   Final    NO GROWTH < 24 HOURS Performed at [facility], [address] [place], [place] [address]    Report Status PENDING  Incomplete  Blood Culture (routine x 2)     Status: None (Preliminary result)   Collection Time: 03/02/19 11:37 AM   Specimen: BLOOD LEFT ARM  Result Value Ref Range Status   Specimen Description BLOOD LEFT ARM  Final   Special Requests   Final    BOTTLES DRAWN AEROBIC AND ANAEROBIC Blood Culture adequate volume   Culture   Final    NO GROWTH < 24 HOURS Performed at [facility], [address] [place], [place] [address]    Report Status PENDING  Incomplete      Scheduled Meds: . albuterol  2 puff Inhalation Q6H  . allopurinol  300 mg Oral q morning - 10a  . amiodarone  200 mg Oral Daily  . apixaban  5 mg Oral BID  . ciprofloxacin  500 mg Oral BID  . insulin aspart  0-5 Units Subcutaneous QHS  . insulin aspart  0-9 Units  Subcutaneous TID WC  . levothyroxine  150 mcg Oral QAC breakfast  . methylPREDNISolone (SOLU-MEDROL) injection  0.5 mg/kg Intravenous Q12H  . multivitamin with minerals  1 tablet Oral Daily  . sodium chloride flush  3 mL Intravenous Q12H  . vitamin C  500 mg Oral Daily  . zinc sulfate  220 mg Oral Daily   Continuous Infusions: . sodium chloride    . remdesivir 100 mg in NS 250 mL       LOS: 1 day   Cherene Altes, MD [facility] Office  [phone] Pager - Text Page per Amion  If 7PM-7AM, please contact night-coverage per Amion 03/03/2019, 8:09 AM

## 2019-03-04 ENCOUNTER — Inpatient Hospital Stay (HOSPITAL_COMMUNITY): Payer: Medicare HMO

## 2019-03-04 DIAGNOSIS — Z9861 Coronary angioplasty status: Secondary | ICD-10-CM

## 2019-03-04 DIAGNOSIS — I2510 Atherosclerotic heart disease of native coronary artery without angina pectoris: Secondary | ICD-10-CM

## 2019-03-04 DIAGNOSIS — K651 Peritoneal abscess: Secondary | ICD-10-CM | POA: Diagnosis not present

## 2019-03-04 DIAGNOSIS — K769 Liver disease, unspecified: Secondary | ICD-10-CM | POA: Diagnosis not present

## 2019-03-04 LAB — BPAM FFP
Blood Product Expiration Date: 202011062155
ISSUE DATE / TIME: 202011052244
Unit Type and Rh: 6200

## 2019-03-04 LAB — CBC WITH DIFFERENTIAL/PLATELET
Abs Immature Granulocytes: 0.08 10*3/uL — ABNORMAL HIGH (ref 0.00–0.07)
Basophils Absolute: 0 10*3/uL (ref 0.0–0.1)
Basophils Relative: 0 %
Eosinophils Absolute: 0 10*3/uL (ref 0.0–0.5)
Eosinophils Relative: 0 %
HCT: 43.1 % (ref 39.0–52.0)
Hemoglobin: 14.4 g/dL (ref 13.0–17.0)
Immature Granulocytes: 1 %
Lymphocytes Relative: 5 %
Lymphs Abs: 0.5 10*3/uL — ABNORMAL LOW (ref 0.7–4.0)
MCH: 32.9 pg (ref 26.0–34.0)
MCHC: 33.4 g/dL (ref 30.0–36.0)
MCV: 98.4 fL (ref 80.0–100.0)
Monocytes Absolute: 0.7 10*3/uL (ref 0.1–1.0)
Monocytes Relative: 7 %
Neutro Abs: 8.3 10*3/uL — ABNORMAL HIGH (ref 1.7–7.7)
Neutrophils Relative %: 87 %
Platelets: 118 10*3/uL — ABNORMAL LOW (ref 150–400)
RBC: 4.38 MIL/uL (ref 4.22–5.81)
RDW: 14.4 % (ref 11.5–15.5)
WBC: 9.6 10*3/uL (ref 4.0–10.5)
nRBC: 0 % (ref 0.0–0.2)

## 2019-03-04 LAB — COMPREHENSIVE METABOLIC PANEL
ALT: 19 U/L (ref 0–44)
AST: 33 U/L (ref 15–41)
Albumin: 3 g/dL — ABNORMAL LOW (ref 3.5–5.0)
Alkaline Phosphatase: 55 U/L (ref 38–126)
Anion gap: 12 (ref 5–15)
BUN: 29 mg/dL — ABNORMAL HIGH (ref 8–23)
CO2: 27 mmol/L (ref 22–32)
Calcium: 8.2 mg/dL — ABNORMAL LOW (ref 8.9–10.3)
Chloride: 102 mmol/L (ref 98–111)
Creatinine, Ser: 0.86 mg/dL (ref 0.61–1.24)
GFR calc Af Amer: >60 mL/min (ref >60)
GFR calc non Af Amer: >60 mL/min (ref >60)
Glucose, Bld: 125 mg/dL — ABNORMAL HIGH (ref 70–99)
Potassium: 4.2 mmol/L (ref 3.5–5.1)
Sodium: 141 mmol/L (ref 135–145)
Total Bilirubin: 0.8 mg/dL (ref 0.3–1.2)
Total Protein: 7.1 g/dL (ref 6.5–8.1)

## 2019-03-04 LAB — PREPARE FRESH FROZEN PLASMA

## 2019-03-04 LAB — MAGNESIUM: Magnesium: 2.6 mg/dL — ABNORMAL HIGH (ref 1.7–2.4)

## 2019-03-04 LAB — GLUCOSE, CAPILLARY
Glucose-Capillary: 118 mg/dL — ABNORMAL HIGH (ref 70–99)
Glucose-Capillary: 151 mg/dL — ABNORMAL HIGH (ref 70–99)
Glucose-Capillary: 118 mg/dL — ABNORMAL HIGH (ref 70–99)

## 2019-03-04 LAB — D-DIMER, QUANTITATIVE: D-Dimer, Quant: >20 ug/mL-FEU — ABNORMAL HIGH (ref 0.00–0.50)

## 2019-03-04 LAB — C-REACTIVE PROTEIN: CRP: 9.7 mg/dL — ABNORMAL HIGH (ref <1.0)

## 2019-03-04 LAB — FERRITIN: Ferritin: 473 ng/mL — ABNORMAL HIGH (ref 24–336)

## 2019-03-04 LAB — PHOSPHORUS: Phosphorus: 2.5 mg/dL (ref 2.5–4.6)

## 2019-03-04 MED ORDER — OXYCODONE HCL 5 MG PO TABS
5.0000 mg | ORAL_TABLET | ORAL | Status: DC | PRN
  Administered 2019-03-04 – 2019-03-11 (×19): 5 mg via ORAL
  Filled 2019-03-04 (×19): qty 1

## 2019-03-04 NOTE — Progress Notes (Signed)
Son called extremely upset about not receiving a call from MD in 48 hours. I assured son that I would relay the message to MD. Also educated Son that RN's call family twice a day for updates.

## 2019-03-04 NOTE — Evaluation (Signed)
Physical Therapy Evaluation Patient Details Name: Wayne Ortiz MRN: [id number] DOB: 20-Feb-1946 Today's Date: 03/04/2019   History of Present Illness  73 y/o male w/ hx of renal disorder, persistent a -fib, HTn, hypothyroidism, gout, kidney stones, GERD, femur fx, dyrhythmia, CAD, BPH, arthritis, R TKA 2015, cervical spine surgery, TEE w/o cardioversion, diagnosed as Covid positive at [facility] 10/26 at which time a chest x-ray noted opacities bilaterally, able to go home, re-evaluated on 11/01 w/ worsening SOB again able to go home, presented to ED 11/04 and was found to be hypoxic hence admitted to [facility].  Clinical Impression   Pt admitted with above diagnosis. PTA states was independent, he was able to complete ADLs, ambulated indoors with not much difficulty but sed a lawn mower when outdoors (?). States has hx of SOB at baseline and a-fib. Pt currently with functional limitations due to the deficits listed below (see PT Problem List). Initially pt is on 4L/min via HFNC and sats in high 80s, but at rest is calm and no alarms going off. With mobility becomes extremely apprehensive and sats drop (he anticipates sats will drop but when they do he tends become more agitated). Pt is able to get around room with min a and HHA, able to ambulate to rest room  And sats drop to 69% min, increased pt to initially 6L/min but unable to recover fully hence increased flow to 8L then 10L/min via HFNC. He was able to complete using commode and clean up, HR increased to max 150s, with cues able to slow down breathing and return HR to low 100s-90s w/ sats in low 80s. Once up and ambulating back to room again sats decrease to 60s.Once sitting and sats increase to low 80s, initiated education on use of flutter valve and pt did well with this. 02 sats monitored via pedi earlobe probe on L earlobe.  Pt will benefit from skilled PT to increase their independence and safety with mobility to allow discharge to the venue listed below. Pt  will benefit from post acute care level rehab at d/c.      Follow Up Recommendations SNF    Equipment Recommendations  Rolling walker with 5" wheels;3in1 (PT)    Recommendations for Other Services OT consult     Precautions / Restrictions Precautions Precautions: Fall Precaution Comments: monitor 02 sats drop w/ activity Restrictions Weight Bearing Restrictions: No      Mobility  Bed Mobility               General bed mobility comments: arrived to find pt in recliner  Transfers Overall transfer level: Needs assistance Equipment used: 1 person hand held assist Transfers: Sit to/from Stand Sit to Stand: Min assist         General transfer comment: stands from recliner with SBA, from commode in rest room (lower) with HHA  Ambulation/Gait Ambulation/Gait assistance: Min guard Gait Distance (Feet): 30 Feet Assistive device: 1 person hand held assist Gait Pattern/deviations: Wide base of support;Staggering right;Staggering left Gait velocity: slow   General Gait Details: able to ambulate to rest room and back to recliner needing HHA fror stability. Pt on 4L/min initially and desat to 69% min, while on commode increased 02 to 6L, then 8L and finally to 10L/min via HFNC and still sats fluctuate into low 70s and back to mid 80s. HR also fluctuates with max increase to 150s, pt reports he has hx of a fib at baseline. Pt also gets extremely apprehensive each time monitor alarms  causing more fluctuations  Stairs            Wheelchair Mobility    Modified Rankin (Stroke Patients Only)       Balance Overall balance assessment: Needs assistance Sitting-balance support: Feet unsupported Sitting balance-Leahy Scale: Good     Standing balance support: Single extremity supported;During functional activity Standing balance-Leahy Scale: Fair                               Pertinent Vitals/Pain Pain Assessment: Faces Faces Pain Scale: Hurts even  more Pain Location: in chest with breathing hard Pain Intervention(s): Limited activity within patient's tolerance    Home Living Family/patient expects to be discharged to:: Private residence Living Arrangements: Children Available Help at Discharge: Family                  Prior Function Level of Independence: Independent with assistive device(s)         Comments: states was independent in home, rarely has to go out of home w/o AD, uses a Conservation officer, nature out doors, has hx of SOB at baseline     Hand Dominance        Extremity/Trunk Assessment   Upper Extremity Assessment Upper Extremity Assessment: Defer to OT evaluation    Lower Extremity Assessment Lower Extremity Assessment: Generalized weakness       Communication   Communication: No difficulties  Cognition Arousal/Alertness: Awake/alert Behavior During Therapy: WFL for tasks assessed/performed Overall Cognitive Status: Within Functional Limits for tasks assessed                                        General Comments      Exercises Other Exercises Other Exercises: initialized use of flutter valve   Assessment/Plan    PT Assessment Patient needs continued PT services  PT Problem List Decreased strength;Decreased activity tolerance;Decreased balance;Decreased mobility;Decreased coordination;Decreased safety awareness       PT Treatment Interventions Gait training;Functional mobility training;Therapeutic activities;Therapeutic exercise;Balance training;Neuromuscular re-education;Patient/family education    PT Goals (Current goals can be found in the Care Plan section)  Acute Rehab PT Goals Patient Stated Goal: "get back on my feet" PT Goal Formulation: With patient Time For Goal Achievement: 03/18/19 Potential to Achieve Goals: Fair    Frequency Min 2X/week   Barriers to discharge        Co-evaluation               AM-PAC PT "6 Clicks" Mobility  Outcome Measure Help  needed turning from your back to your side while in a flat bed without using bedrails?: A Lot Help needed moving from lying on your back to sitting on the side of a flat bed without using bedrails?: A Lot   Help needed standing up from a chair using your arms (e.g., wheelchair or bedside chair)?: A Little Help needed to walk in hospital room?: A Little Help needed climbing 3-5 steps with a railing? : A Lot 6 Click Score: 12    End of Session Equipment Utilized During Treatment: Oxygen Activity Tolerance: Treatment limited secondary to medical complications (Comment);Patient limited by fatigue;Patient limited by lethargy Patient left: in chair;with call bell/phone within reach;with nursing/sitter in room Nurse Communication: Mobility status;Precautions;Other (comment)(02 sats ) PT Visit Diagnosis: Other abnormalities of gait and mobility (R26.89);Muscle weakness (generalized) (M62.81)    Time:  [id number] PT Time Calculation (min) (ACUTE ONLY): 35 min   Charges:   PT Evaluation $PT Eval Moderate Complexity: 1 Mod PT Treatments $Therapeutic Activity: 8-22 mins $Self Care/Home Management: 8-22        Horald Chestnut, PT   Delford Field 03/04/2019, 3:52 PM

## 2019-03-04 NOTE — Progress Notes (Addendum)
PROGRESS NOTE                                                                                                                                                                                                             Patient Demographics:    Wayne Ortiz, is a 73 y.o. male, DOB - [birth year], SH:[id number]  Outpatient Primary MD for the patient is Celene Squibb, MD   Admit date - 03/02/2019   LOS - 2  Chief Complaint  Patient presents with   Shortness of Breath       Brief Narrative: Patient is a 73 y.o. male with PMHx of HFpEF (02/2018 EF 65%)and CAD (05/2014 RCA & LCX PCI), liver abscess s/p percutaneous JP drain placement (at [facility]) on 10/18 (Cipro through 03/14/19), PAF on anticoagulation-who was Covid positive at [facility] on 10/26 and 11/1-subsequently discharged home-who presented to [facility] emergency department with gradual worsening shortness of breath-found to have acute hypoxic respiratory failure secondary to COVID-19 pneumonia requiring approximately 15 L of oxygen- subsequently transferred to triad hospitalist service at [facility].  Significant Events: 10/26 Covid positive at [facility] 11/1 CTA chest [place] consistent with Covid pneumonitis 11/4 admit to [facility] via [facility]   Subjective:    Wayne Ortiz today feels better-claims that his shortness of breath and cough have decreased.  Sitting on bedside chair-without any major issues.   Assessment  & Plan :   Acute Hypoxic Resp Failure due to Covid 19 Viral pneumonia: Slowly improving-continue steroids and remdesivir-Down to 4 L of oxygen.  CRP has down trended significantly.    Fever: afebrile  O2 requirements: On 4 l/m (was on 6 L yesterday-15 L on initial presentation to the ED)  COVID-19 Labs: Recent Labs    03/02/19 1038 03/03/19 0155 03/04/19 0601  DDIMER 19.80* >20.00* >20.00*  FERRITIN 507* 493* 473*  LDH 370*  --   --   CRP 24* 19.9*  9.7*    Lab Results  Component Value Date   SARSCOV2NAA POSITIVE (A) 03/02/2019   SARSCOV2NAA NOT DETECTED 02/06/2019     COVID-19 Medications: Steroids: 11/4>> Remdesivir: 11/4>> Actemra: Not given due to ongoing hepatic abscess Convalescent Plasma: 11/5 x 1 Research Studies:[name]  Other medications: Diuretics:Euvolemic-continue Lasix-to maintain negative balance Antibiotics:Cipro/Flagyl for hepatic abscess  Prone/Incentive Spirometry: encouraged patient to lie prone for  3-4 hours at a time for a total of 16 hours a day, and to encourage incentive spirometry use 3-4/hour.  DVT Prophylaxis  : Eliquis  Markedly elevated D-dimer: Already fully anticoagulated with Eliquis-await formal Doppler results.  Paroxysmal atrial fibrillation with acute RVR: Rate controlled.  On Eliquis.  Hepatic abscess:Status post percutaneous drain placement 10/18 -treated with Rocephin and Flagyl -was to have repeat imaging at [facility] with consideration being given to removing his drain at that time -drain appears benign at present -continue Cipro/Flagyl.    CAD - PCI to RCA and LCx February 2016  Hypothyroidism: Synthroid  Debility/deconditioning: Secondary to acute illness-PT/OT ordered.  Obesity: Estimated body mass index is 33.91 kg/m as calculated from the following:   Height as of this encounter: 6' (1.829 m).   Weight as of this encounter: 113.4 kg.   ABG:    Component Value Date/Time   PHART 7.461 (H) 03/02/2019 1043   PCO2ART 31.7 (L) 03/02/2019 1043   PO2ART 78.6 (L) 03/02/2019 1043   HCO3 24.2 03/02/2019 1043   TCO2 28 07/04/2016 0812   ACIDBASEDEF 1.0 03/02/2019 1043   O2SAT 95.1 03/02/2019 1043    Condition - Extremely Guarded  Family Communication  : Son updated over the phone on 11/6  Code Status :  Full Code  Diet :  Diet Order            Diet regular Room service appropriate? Yes; Fluid consistency: Thin  Diet effective now               Disposition Plan  :   Remain hospitalized  Barriers to discharge: Hypoxia requiring O2 supplementation/complete 5 days of IV Remdesivir  Consults  :  None  Procedures  :  None  Antibiotics  :    Anti-infectives (From admission, onward)   Start     Dose/Rate Route Frequency Ordered Stop   03/03/19 1600  remdesivir 100 mg in sodium chloride 0.9 % 250 mL IVPB     100 mg 500 mL/hr over 30 Minutes Intravenous Every 24 hours 03/02/19 2024 03/07/19 1559   03/03/19 1600  metroNIDAZOLE (FLAGYL) tablet 500 mg     500 mg Oral 3 times daily 03/03/19 1248     03/03/19 1300  ciprofloxacin (CIPRO) tablet 500 mg  Status:  Discontinued     500 mg Oral 2 times daily 03/03/19 1248 03/03/19 1249   03/03/19 0200  vancomycin (VANCOCIN) IVPB 1000 mg/200 mL premix  Status:  Discontinued     1,000 mg 200 mL/hr over 60 Minutes Intravenous Every 12 hours 03/02/19 1424 03/02/19 1701   03/02/19 2200  ceFEPIme (MAXIPIME) 2 g in sodium chloride 0.9 % 100 mL IVPB  Status:  Discontinued     2 g 200 mL/hr over 30 Minutes Intravenous Every 8 hours 03/02/19 1310 03/02/19 1701   03/02/19 2200  ciprofloxacin (CIPRO) tablet 500 mg     500 mg Oral 2 times daily 03/02/19 2031     03/02/19 2130  remdesivir 200 mg in sodium chloride 0.9 % 250 mL IVPB     200 mg 500 mL/hr over 30 Minutes Intravenous Once 03/02/19 2024 03/04/19 0120   03/02/19 1430  vancomycin (VANCOCIN) IVPB 1000 mg/200 mL premix     1,000 mg 200 mL/hr over 60 Minutes Intravenous  Once 03/02/19 1420 03/02/19 1659   03/02/19 1215  vancomycin (VANCOCIN) IVPB 1000 mg/200 mL premix     1,000 mg 200 mL/hr over 60 Minutes Intravenous Every 1 hr x  2 03/02/19 1146 03/02/19 1416   03/02/19 1145  ceFEPIme (MAXIPIME) 2 g in sodium chloride 0.9 % 100 mL IVPB     2 g 200 mL/hr over 30 Minutes Intravenous  Once 03/02/19 1136 03/02/19 1315   03/02/19 1145  vancomycin (VANCOCIN) IVPB 1000 mg/200 mL premix  Status:  Discontinued     1,000 mg 200 mL/hr over 60 Minutes Intravenous  Once  03/02/19 1136 03/02/19 1146      Inpatient Medications  Scheduled Meds:  allopurinol  300 mg Oral q morning - 10a   apixaban  5 mg Oral BID   ciprofloxacin  500 mg Oral BID   dexamethasone (DECADRON) injection  6 mg Intravenous Q24H   furosemide  40 mg Intravenous Q12H   levothyroxine  137 mcg Oral QAC breakfast   metroNIDAZOLE  500 mg Oral TID   multivitamin with minerals  1 tablet Oral Daily   vitamin C  500 mg Oral Daily   zinc sulfate  220 mg Oral Daily   Continuous Infusions:  remdesivir 100 mg in NS 250 mL Stopped (03/04/19 0120)   PRN Meds:.acetaminophen **OR** [DISCONTINUED] acetaminophen, albuterol, chlorpheniramine-HYDROcodone, guaiFENesin-dextromethorphan, HYDROcodone-acetaminophen, ondansetron **OR** ondansetron (ZOFRAN) IV, polyethylene glycol, traZODone   Time Spent in minutes  25  See all Orders from today for further details   Oren Binet M.D on 03/04/2019 at 12:31 PM  To page go to [email or web address] - use universal password  [facility] -  Office  [phone]    Objective:   Vitals:   03/04/19 0400 03/04/19 0600 03/04/19 0719 03/04/19 1134  BP: 107/67 118/77 126/84 106/65  Pulse: 62 84 76 84  Resp: (!) 21 (!) 24 18 20   Temp: 98.1 F (36.7 C)  (!) 97.4 F (36.3 C) 97.7 F (36.5 C)  TempSrc: Oral  Axillary Oral  SpO2: 99% 94% 93% 95%  Weight:      Height:        Wt Readings from Last 3 Encounters:  03/02/19 113.4 kg  02/06/19 113.4 kg  09/03/17 117 kg     Intake/Output Summary (Last 24 hours) at 03/04/2019 1231 Last data filed at 03/04/2019 1100 Gross per 24 hour  Intake 1708 ml  Output 1550 ml  Net 158 ml     Physical Exam Gen Exam:Alert awake-not in any distress HEENT:atraumatic, normocephalic Chest: B/L clear to auscultation anteriorly CVS:S1S2 regular Abdomen:soft non tender, non distended Extremities:no edema Neurology: Non focal Skin: no rash   Data Review:    CBC Recent Labs  Lab 03/02/19 1038  03/03/19 0155 03/04/19 0601  WBC 7.4 6.0 9.6  HGB 15.2 13.1 14.4  HCT 45.8 40.2 43.1  PLT 150 102* 118*  MCV 98.5 98.0 98.4  MCH 32.7 32.0 32.9  MCHC 33.2 32.6 33.4  RDW 14.1 14.3 14.4  LYMPHSABS 0.4* 0.4* 0.5*  MONOABS 0.6 0.4 0.7  EOSABS 0.0 0.0 0.0  BASOSABS 0.0 0.0 0.0    Chemistries  Recent Labs  Lab 03/02/19 1038 03/03/19 0155 03/04/19 0601  NA 134* 133* 141  K 4.1 4.4 4.2  CL 99 100 102  CO2 20* 21* 27  GLUCOSE 103* 127* 125*  BUN 22 22 29*  CREATININE 1.12 0.99 0.86  CALCIUM 8.0* 6.9* 8.2*  MG  --  2.2 2.6*  AST 32 26 33  ALT 15 11 19   ALKPHOS 44 38 55  BILITOT 0.7 0.4 0.8   ------------------------------------------------------------------------------------------------------------------ Recent Labs    03/02/19 1038  TRIG 62    Lab Results  Component Value Date   HGBA1C 5.8 (H) 08/08/2014   ------------------------------------------------------------------------------------------------------------------ No results for input(s): TSH, T4TOTAL, T3FREE, THYROIDAB in the last 72 hours.  Invalid input(s): FREET3 ------------------------------------------------------------------------------------------------------------------ Recent Labs    03/03/19 0155 03/04/19 0601  FERRITIN 493* 473*    Coagulation profile No results for input(s): INR, PROTIME in the last 168 hours.  Recent Labs    03/03/19 0155 03/04/19 0601  DDIMER >20.00* >20.00*    Cardiac Enzymes No results for input(s): CKMB, TROPONINI, MYOGLOBIN in the last 168 hours.  Invalid input(s): CK ------------------------------------------------------------------------------------------------------------------    Component Value Date/Time   BNP 77.0 03/02/2019 1046    Micro Results Recent Results (from the past 240 hour(s))  SARS Coronavirus 2 by RT PCR (hospital order, performed in [facility] hospital lab) Nasopharyngeal Nasopharyngeal Swab     Status: Abnormal   Collection  Time: 03/02/19 11:08 AM   Specimen: Nasopharyngeal Swab  Result Value Ref Range Status   SARS Coronavirus 2 POSITIVE (A) NEGATIVE Final    Comment: RESULT CALLED TO, READ BACK BY AND VERIFIED WITH: R HOLCOLMB AT 1410 BY HFLYNT 03/02/19 (NOTE) If result is NEGATIVE SARS-CoV-2 target nucleic acids are NOT DETECTED. The SARS-CoV-2 RNA is generally detectable in upper and lower  respiratory specimens during the acute phase of infection. The lowest  concentration of SARS-CoV-2 viral copies this assay can detect is 250  copies / mL. A negative result does not preclude SARS-CoV-2 infection  and should not be used as the sole basis for treatment or other  patient management decisions.  A negative result may occur with  improper specimen collection / handling, submission of specimen other  than nasopharyngeal swab, presence of viral mutation(s) within the  areas targeted by this assay, and inadequate number of viral copies  (<250 copies / mL). A negative result must be combined with clinical  observations, patient history, and epidemiological information. If result is POSITIVE SARS-CoV-2 target nucleic acids are DETECTED.  The SARS-CoV-2 RNA is generally detectable in upper and lower  respiratory specimens during the acute phase of infection.  Positive  results are indicative of active infection with SARS-CoV-2.  Clinical  correlation with patient history and other diagnostic information is  necessary to determine patient infection status.  Positive results do  not rule out bacterial infection or co-infection with other viruses. If result is PRESUMPTIVE POSTIVE SARS-CoV-2 nucleic acids MAY BE PRESENT.   A presumptive positive result was obtained on the submitted specimen  and confirmed on repeat testing.  While 2019 novel coronavirus  (SARS-CoV-2) nucleic acids may be present in the submitted sample  additional confirmatory testing may be necessary for epidemiological  and / or clinical  management purposes  to differentiate between  SARS-CoV-2 and other Sarbecovirus currently known to infect humans.  If clinically indicated additional testing with an alternate test  methodology [phone])  is advised. The SARS-CoV-2 RNA is generally  detectable in upper and lower respiratory specimens during the acute  phase of infection. The expected result is Negative. Fact Sheet for Patients:  StrictlyIdeas.no Fact Sheet for Healthcare Providers: BankingDealers.co.za This test is not yet approved or cleared by the [place] FDA and has been authorized for detection and/or diagnosis of SARS-CoV-2 by FDA under an Emergency Use Authorization (EUA).  This EUA will remain in effect (meaning this test can be used) for the duration of the COVID-19 declaration under Section 564(b)(1) of the Act, 21 U.S.C. section 360bbb-3(b)(1), unless the authorization is terminated or revoked sooner. Performed at  [facility], [address] [place], [place] [address]   Blood Culture (routine x 2)     Status: None (Preliminary result)   Collection Time: 03/02/19 11:37 AM   Specimen: BLOOD LEFT ARM  Result Value Ref Range Status   Specimen Description BLOOD LEFT ARM  Final   Special Requests   Final    BOTTLES DRAWN AEROBIC AND ANAEROBIC Blood Culture adequate volume   Culture   Final    NO GROWTH 2 DAYS Performed at [facility], [address] [place], [place] [address]    Report Status PENDING  Incomplete  Blood Culture (routine x 2)     Status: None (Preliminary result)   Collection Time: 03/02/19 11:37 AM   Specimen: BLOOD LEFT ARM  Result Value Ref Range Status   Specimen Description BLOOD LEFT ARM  Final   Special Requests   Final    BOTTLES DRAWN AEROBIC AND ANAEROBIC Blood Culture adequate volume   Culture   Final    NO GROWTH 2 DAYS Performed at [facility], [address], [place], [place] [address]    Report Status PENDING   Incomplete    Radiology Reports Dg Chest 2 View  Result Date: 02/06/2019 CLINICAL DATA:  Onset fever and right flank pain 02/03/2019. EXAM: CHEST - 2 VIEW COMPARISON:  PA and lateral chest 04/03/2017. FINDINGS: Lungs clear. Heart size normal. No pneumothorax or pleural fluid. Atherosclerosis noted. No acute or focal bony abnormality. IMPRESSION: No acute disease. Atherosclerosis. Electronically Signed   By: Inge Rise M.D.   On: 02/06/2019 15:56   Dg Pelvis Portable  Result Date: 03/02/2019 CLINICAL DATA:  Right hip pain.  No injury. EXAM: PORTABLE PELVIS 1-2 VIEWS COMPARISON:  CT abdomen pelvis dated February 06, 2019. FINDINGS: No acute fracture or dislocation. Prior left proximal femur ORIF. Unchanged mild bilateral hip joint space narrowing with small marginal osteophytes. The pubic symphysis and sacroiliac joints are unremarkable. Lower lumbar spondylosis. Bone mineralization is normal. Soft tissues are unremarkable. IMPRESSION: 1.  No acute osseous abnormality. 2. Unchanged mild bilateral hip degenerative changes. Electronically Signed   By: Titus Dubin M.D.   On: 03/02/2019 11:27   Dg Chest Port 1 View  Result Date: 03/04/2019 CLINICAL DATA:  COVID-19 pneumonia. EXAM: PORTABLE CHEST 1 VIEW COMPARISON:  03/03/2019 FINDINGS: Lordotic technique demonstrated. Lungs are adequately inflated with slight overall worsening bilateral patchy airspace process left lung worse than right. No definite effusion. Cardiomediastinal silhouette and remainder of the exam is unchanged. IMPRESSION: Slight interval worsening of diffuse bilateral patchy airspace process compatible with patient's known COVID-19 pneumonia. Electronically Signed   By: Marin Olp M.D.   On: 03/04/2019 09:00   Dg Chest Port 1 View  Result Date: 03/03/2019 CLINICAL DATA:  Shortness of breath. EXAM: PORTABLE CHEST 1 VIEW COMPARISON:  03/02/2019 FINDINGS: Low lung volumes with interstitial and airspace opacities unchanged  compared to prior study. Worse in the peripheral left chest. Cardiomediastinal contours are stable. No signs of acute bone finding with evidence of prior cervicothoracic spinal fusion, partially imaged. IMPRESSION: Findings remain concerning for viral or atypical pneumonia. Asymmetric edema could have a similar appearance. Electronically Signed   By: Zetta Bills M.D.   On: 03/03/2019 11:43   Dg Chest Port 1 View  Result Date: 03/02/2019 CLINICAL DATA:  Severe shortness of breath. EXAM: PORTABLE CHEST 1 VIEW COMPARISON:  Chest x-ray dated February 06, 2019. FINDINGS: The heart size and mediastinal contours are within normal limits. Normal pulmonary vascularity. Patchy asymmetric  peripheral opacities in the left greater than right mid to lower lungs. No pleural effusion or pneumothorax. Unchanged elevation of the right hemidiaphragm. No acute osseous abnormality. IMPRESSION: 1. There are findings in the lungs which are nonspecific, but concerning for atypical infection, including potential viral pneumonia. Electronically Signed   By: Titus Dubin M.D.   On: 03/02/2019 11:25   Ct Renal Stone Study  Result Date: 02/06/2019 CLINICAL DATA:  Flank pain. Fever. Right flank pain since Thursday. EXAM: CT ABDOMEN AND PELVIS WITHOUT CONTRAST TECHNIQUE: Multidetector CT imaging of the abdomen and pelvis was performed following the standard protocol without IV contrast. COMPARISON:  12/30/2012 FINDINGS: Lower chest: Right hemidiaphragm elevation. Normal heart size without pericardial or pleural effusion. Multivessel coronary artery atherosclerosis. Hepatobiliary: Moderate hepatic steatosis. Right hepatic lobe cysts, maximally 4.5 cm. Pneumobilia. Cholecystectomy, without biliary ductal dilatation. Pancreas: Normal, without mass or ductal dilatation. Spleen: Normal in size, without focal abnormality. Adrenals/Urinary Tract: Normal adrenal glands. Mild renal cortical thinning bilaterally. 2.2 cm upper/interpolar left  renal cyst. Left renal vascular calcification. No renal calculi or hydronephrosis. No hydroureter or ureteric calculi. No bladder calculi. Stomach/Bowel: Normal stomach, without wall thickening. Scattered colonic diverticula. Normal terminal ileum and appendix. Normal small bowel. Vascular/Lymphatic: Aortic and branch vessel atherosclerosis. Nonaneurysmal dilatation of the infrarenal aorta at 3.0 cm. No abdominopelvic adenopathy. Reproductive: Mild prostatomegaly. Other: No significant free fluid. Musculoskeletal: Proximal left femoral fixation. Lumbosacral spondylosis. 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 IMPRESSION: 1.  No urinary tract calculi or hydronephrosis. 2. Hepatic steatosis. 3.  Aortic Atherosclerosis (ICD10-I70.0). 4. Prostatomegaly. Electronically Signed   By: Abigail Miyamoto M.D.   On: 02/06/2019 15:59   Vas [place] Lower Extremity Venous (dvt)  Result Date: 03/03/2019  Lower Venous Study Other Indications: Covid Positive, elevated d-diimer. Comparison Study: No priors Performing Technologist: Velva Harman Sturdivant RDMS, RVT  Examination Guidelines: A complete evaluation includes B-mode imaging, spectral Doppler, color Doppler, and power Doppler as needed of all accessible portions of each vessel. Bilateral testing is considered an integral part of a complete examination. Limited examinations for reoccurring indications may be performed as noted.  +---------+---------------+---------+-----------+----------+--------------+  RIGHT     Compressibility Phasicity Spontaneity Properties Thrombus Aging  +---------+---------------+---------+-----------+----------+--------------+  CFV       Full            Yes       Yes                                    +---------+---------------+---------+-----------+----------+--------------+  SFJ       Full                                                             +---------+---------------+---------+-----------+----------+--------------+  FV Prox   Full                                                              +---------+---------------+---------+-----------+----------+--------------+  FV Mid    Full                                                             +---------+---------------+---------+-----------+----------+--------------+  FV Distal Full                                                             +---------+---------------+---------+-----------+----------+--------------+  PFV       Full                                                             +---------+---------------+---------+-----------+----------+--------------+  POP       Full            Yes       Yes                                    +---------+---------------+---------+-----------+----------+--------------+  PTV       Full                                                             +---------+---------------+---------+-----------+----------+--------------+  PERO      Full                                                             +---------+---------------+---------+-----------+----------+--------------+   +---------+---------------+---------+-----------+----------+--------------+  LEFT      Compressibility Phasicity Spontaneity Properties Thrombus Aging  +---------+---------------+---------+-----------+----------+--------------+  CFV       Full            Yes       Yes                                    +---------+---------------+---------+-----------+----------+--------------+  SFJ       Full                                                             +---------+---------------+---------+-----------+----------+--------------+  FV Prox   Full                                                             +---------+---------------+---------+-----------+----------+--------------+  FV Mid    Full                                                             +---------+---------------+---------+-----------+----------+--------------+  FV Distal Full                                                              +---------+---------------+---------+-----------+----------+--------------+  PFV       Full                                                             +---------+---------------+---------+-----------+----------+--------------+  POP       Full            Yes       Yes                                    +---------+---------------+---------+-----------+----------+--------------+  PTV       Full                                                             +---------+---------------+---------+-----------+----------+--------------+  PERO      Full                                                             +---------+---------------+---------+-----------+----------+--------------+     Summary: Right: There is no evidence of deep vein thrombosis in the lower extremity. No cystic structure found in the popliteal fossa. Left: There is no evidence of deep vein thrombosis in the lower extremity. No cystic structure found in the popliteal fossa.  *See table(s) above for measurements and observations.    Preliminary

## 2019-03-04 NOTE — TOC Initial Note (Signed)
Transition of Care [facility]) - Initial/Assessment Note    Patient Details  Name: Wayne Ortiz MRN: [id number] Date of Birth: [birth year]  Transition of Care [facility]) CM/SW Contact:    Ninfa Meeker, RN Phone Number: [phone] (working remotely) 03/04/2019, 10:27 AM  Clinical Narrative:  Patient is a 73 year old male  with a history of tobacco abuse, recent hepatic abscess status post percutaneous drainage, paroxysmal atrial fibrillation/flutter, status post ablation 2016, CAD status post PCI x2 vessels, HTN, HLD, and OSA who was diagnosed as Covid positive at [facility] 10/26 at which time a chest x-ray noted opacities bilaterally.  Reevaluated at [facility] 11/1,  the patient reported that his shortness of breath has gradually and consistently worsened.  [facility] 11/4 at which time he was found to have a saturation of 70% on room air. Patient was admitted and transferred to [facility] for further treatment of COVID pneumonia. Patient is on Remdesivir and decadron, on [place] HFNC and non rebreather. Case manager acknowledged CM referral, will follow for needs as patient medically improves, may he be blessed to do so.                    Patient Goals and CMS Choice        Expected Discharge Plan and Services                                                Prior Living Arrangements/Services                       Activities of Daily Living Home Assistive Devices/Equipment: None ADL Screening (condition at time of admission) Patient's cognitive ability adequate to safely complete daily activities?: Yes Is the patient deaf or have difficulty hearing?: No Does the patient have difficulty seeing, even when wearing glasses/contacts?: No Does the patient have difficulty concentrating, remembering, or making decisions?: No Patient able to express need for assistance with ADLs?: Yes Does the patient have difficulty dressing or bathing?: Yes Independently performs  ADLs?: No Communication: Independent Dressing (OT): Needs assistance Is this a change from baseline?: Change from baseline, expected to last <3days Grooming: Needs assistance Is this a change from baseline?: Change from baseline, expected to last <3 days Feeding: Independent Bathing: Needs assistance Is this a change from baseline?: Change from baseline, expected to last <3 days Toileting: Needs assistance Is this a change from baseline?: Change from baseline, expected to last <3 days In/Out Bed: Needs assistance Is this a change from baseline?: Change from baseline, expected to last <3 days Walks in Home: Independent Does the patient have difficulty walking or climbing stairs?: Yes Weakness of Legs: None Weakness of Arms/Hands: None  Permission Sought/Granted                  Emotional Assessment              Admission diagnosis:  Acute respiratory failure with hypoxia ([place]) [J96.01] COVID-19 virus infection [U07.1] Patient Active Problem List   Diagnosis Date Noted  . Acute respiratory disease due to COVID-19 virus 03/02/2019  . Pneumonia due to COVID-19 virus 03/02/2019  . Acute Hypoxic Respiratory failure, acute due to COVID PNA 03/02/2019  . Liver abscess- s/p JP drain 03/02/2019  . A-fib with RVR 03/15/2015  . Essential hypertension 12/09/2014  .  Morbid obesity-BMI 37 12/09/2014  . Hypothyroidism 12/09/2014  . Dyspnea 12/09/2014  . Chronic anticoagulation-Eliquis 12/09/2014  . Persistent atrial fibrillation ([place]) 12/07/2014  . Chest pain 09/13/2014  . Palpitations 09/13/2014  . CAD S/P RCA and CFX DES Feb 2016 09/13/2014  . Coronary artery abnormality 06/22/2014  . Presence of stent in right coronary artery 06/22/2014  . Presence of coronary angioplasty implant and graft 06/22/2014  . Abnormal cardiovascular function study 06/13/2014  . Postoperative stiffness of total knee replacement ([place]) 06/21/2013  . Pain in right knee 06/21/2013  . Difficulty in  walking(719.7) 06/21/2013  . S/P right TKA 05/30/2013  . Abdominal pain, right upper quadrant 12/31/2012  . Abnormal transaminases 12/31/2012  . Thrombocytopenia, unspecified ([place]) 12/31/2012   PCP:  Celene Squibb, MD Pharmacy:   [id number] - [place], [address] [place] [address] Phone: [phone] Fax: [place], [address] [place] [address] Phone: [phone] Fax: [phone]  [facility] [place], [address] [place] [address] Phone: [phone] Fax: [phone]     Social Determinants of Health (SDOH) Interventions    Readmission Risk Interventions No flowsheet data found.

## 2019-03-05 DIAGNOSIS — I10 Essential (primary) hypertension: Secondary | ICD-10-CM

## 2019-03-05 LAB — CBC WITH DIFFERENTIAL/PLATELET
Abs Immature Granulocytes: 0.13 10*3/uL — ABNORMAL HIGH (ref 0.00–0.07)
Basophils Absolute: 0 10*3/uL (ref 0.0–0.1)
Basophils Relative: 0 %
Eosinophils Absolute: 0 10*3/uL (ref 0.0–0.5)
Eosinophils Relative: 0 %
HCT: 45 % (ref 39.0–52.0)
Hemoglobin: 14.6 g/dL (ref 13.0–17.0)
Immature Granulocytes: 1 %
Lymphocytes Relative: 6 %
Lymphs Abs: 0.6 10*3/uL — ABNORMAL LOW (ref 0.7–4.0)
MCH: 31.9 pg (ref 26.0–34.0)
MCHC: 32.4 g/dL (ref 30.0–36.0)
MCV: 98.3 fL (ref 80.0–100.0)
Monocytes Absolute: 0.7 10*3/uL (ref 0.1–1.0)
Monocytes Relative: 7 %
Neutro Abs: 9 10*3/uL — ABNORMAL HIGH (ref 1.7–7.7)
Neutrophils Relative %: 86 %
Platelets: 96 10*3/uL — ABNORMAL LOW (ref 150–400)
RBC: 4.58 MIL/uL (ref 4.22–5.81)
RDW: 14.6 % (ref 11.5–15.5)
WBC: 10.4 10*3/uL (ref 4.0–10.5)
nRBC: 0 % (ref 0.0–0.2)

## 2019-03-05 LAB — COMPREHENSIVE METABOLIC PANEL
ALT: 24 U/L (ref 0–44)
AST: 29 U/L (ref 15–41)
Albumin: 2.8 g/dL — ABNORMAL LOW (ref 3.5–5.0)
Alkaline Phosphatase: 66 U/L (ref 38–126)
Anion gap: 12 (ref 5–15)
BUN: 34 mg/dL — ABNORMAL HIGH (ref 8–23)
CO2: 25 mmol/L (ref 22–32)
Calcium: 8.1 mg/dL — ABNORMAL LOW (ref 8.9–10.3)
Chloride: 104 mmol/L (ref 98–111)
Creatinine, Ser: 0.94 mg/dL (ref 0.61–1.24)
GFR calc Af Amer: >60 mL/min (ref >60)
GFR calc non Af Amer: >60 mL/min (ref >60)
Glucose, Bld: 126 mg/dL — ABNORMAL HIGH (ref 70–99)
Potassium: 4 mmol/L (ref 3.5–5.1)
Sodium: 141 mmol/L (ref 135–145)
Total Bilirubin: 0.7 mg/dL (ref 0.3–1.2)
Total Protein: 6.9 g/dL (ref 6.5–8.1)

## 2019-03-05 LAB — D-DIMER, QUANTITATIVE: D-Dimer, Quant: >20 ug/mL-FEU — ABNORMAL HIGH (ref 0.00–0.50)

## 2019-03-05 LAB — C-REACTIVE PROTEIN: CRP: 4.7 mg/dL — ABNORMAL HIGH (ref <1.0)

## 2019-03-05 LAB — MAGNESIUM: Magnesium: 2.4 mg/dL (ref 1.7–2.4)

## 2019-03-05 MED ORDER — DILTIAZEM HCL 25 MG/5ML IV SOLN
10.0000 mg | Freq: Four times a day (QID) | INTRAVENOUS | Status: DC | PRN
  Administered 2019-03-05: 10 mg via INTRAVENOUS
  Filled 2019-03-05 (×2): qty 5

## 2019-03-05 MED ORDER — DILTIAZEM HCL 90 MG PO TABS
90.0000 mg | ORAL_TABLET | Freq: Three times a day (TID) | ORAL | Status: DC
  Administered 2019-03-05 – 2019-03-10 (×17): 90 mg via ORAL
  Filled 2019-03-05 (×23): qty 1

## 2019-03-05 MED ORDER — ENOXAPARIN SODIUM 120 MG/0.8ML ~~LOC~~ SOLN
115.0000 mg | Freq: Two times a day (BID) | SUBCUTANEOUS | Status: DC
  Administered 2019-03-05 – 2019-03-10 (×12): 115 mg via SUBCUTANEOUS
  Filled 2019-03-05 (×16): qty 0.8

## 2019-03-05 NOTE — Plan of Care (Signed)
  Problem: Activity: Goal: Ability to tolerate increased activity will improve Outcome: Progressing   Problem: Clinical Measurements: Goal: Ability to maintain a body temperature in the normal range will improve Outcome: Progressing   Problem: Education: Goal: Knowledge of risk factors and measures for prevention of condition will improve Outcome: Progressing   Problem: Coping: Goal: Psychosocial and spiritual needs will be supported Outcome: Progressing   Problem: Respiratory: Goal: Will maintain a patent airway Outcome: Progressing Goal: Complications related to the disease process, condition or treatment will be avoided or minimized Outcome: Progressing

## 2019-03-05 NOTE — Progress Notes (Signed)
MEWS/VS Documentation      03/05/2019 0730 03/05/2019 0750 03/05/2019 0807 03/05/2019 1034   MEWS Score:  1  1  4  2    MEWS Score Color:  Green  Green  Red  Yellow   Resp:  (!) 23  -  -  (!) 22   Pulse:  61  72  -  76   O2 Device:  (S) Nasal Cannula  Nasal Cannula  -  Nasal Cannula   O2 Flow Rate (L/min):  (S) 3 L/min  -  -  3 L/min     Pt assessed. PRN Cardizem given for irregular heartrate. Pt tolerated well. Pt currently sitting in recliner, talking to his son on the phone, with no s/s of distress noted. Will continue to monitor.

## 2019-03-05 NOTE — Progress Notes (Addendum)
ANTICOAGULATION CONSULT NOTE - Initial Consult  Pharmacy Consult for Lovenox Indication: atrial fibrillation, elevated D-dimer  Allergies  Allergen Reactions  . Atorvastatin Other (See Comments)    Arthralgias  . Lisinopril Other (See Comments)    CP and SOB  . Metoprolol Other (See Comments)    CP and SOb  . Rocephin [Ceftriaxone Sodium In Dextrose] Rash  . Rocephin [Ceftriaxone] Rash    Patient Measurements: Height: 6' (182.9 cm) Weight: 250 lb (113.4 kg) IBW/kg (Calculated) : 77.6  Vital Signs: Temp: 97.6 F (36.4 C) (11/07 0726) Temp Source: Axillary (11/07 0726) BP: 109/96 (11/07 0726) Pulse Rate: 72 (11/07 0750)  Labs: Recent Labs    03/02/19 1046 03/02/19 1301 03/03/19 0155 03/04/19 0601 03/05/19 0620  HGB  --   --  13.1 14.4 14.6  HCT  --   --  40.2 43.1 45.0  PLT  --   --  102* 118* 96*  CREATININE  --   --  0.99 0.86 0.94  TROPONINIHS 13 14  --   --   --     Estimated Creatinine Clearance: 89.6 mL/min (by C-G formula based on SCr of 0.94 mg/dL).   Medical History: Past Medical History:  Diagnosis Date  . Arthritis   . Atrial fibrillation ([place])   . BPH (benign prostatic hypertrophy)   . CAD (coronary artery disease)    a. s/p DES 05/2014 at [facility]  . Complication of anesthesia    BECAME COMBATIVE  . Dysrhythmia   . Femur fracture ([place])   . GERD (gastroesophageal reflux disease)    occasional TUMS  . Gout   . History of gout   . History of kidney stones   . Hypertension   . Hypothyroidism   . Persistent atrial fibrillation ([place])   . Precancerous skin lesion   . Renal disorder     Medications:  Scheduled:  . allopurinol  300 mg Oral q morning - 10a  . ciprofloxacin  500 mg Oral BID  . dexamethasone (DECADRON) injection  6 mg Intravenous Q24H  . diltiazem  90 mg Oral Q8H  . levothyroxine  137 mcg Oral QAC breakfast  . metroNIDAZOLE  500 mg Oral TID  . multivitamin with minerals  1 tablet Oral Daily  . vitamin C  500 mg Oral Daily  .  zinc sulfate  220 mg Oral Daily   Assessment: 67 yoM admitted on 11/4 with COVID-19 pneumonia.  He is on chronic apixaban anticoagulation prior to admission for afib.  Pharmacy is now consulted to change to treatment dose Lovenox for afib and elevated D-dimer.  Apixaban 5 mg PO BID - last dose on 11/6 at 2100 D-dimer:  19.8, > 20, > 20 SCr <1 with CrCl ~ 90 ml/min CBC: HGb remains wnl, Plt remain low/decreased to 96k (baseline 140 on admission) No bleeding or complications reported.   Goal of Therapy:  Anti-Xa level 0.6-1 units/ml 4hrs after LMWH dose given Monitor platelets by anticoagulation protocol: Yes   Plan:  Lovenox 1 mg/kg (115 mg)  q12h. Follow up renal function, CBC, bleeding, D-dimer.  Gretta Arab PharmD, BCPS Clinical pharmacist phone 7am- 5pm: [phone] 03/05/2019 10:05 AM

## 2019-03-05 NOTE — Progress Notes (Signed)
PROGRESS NOTE                                                                                                                                                                                                             Patient Demographics:    Wayne Ortiz, is a 73 y.o. male, DOB - [birth year], SH:[id number]  Outpatient Primary MD for the patient is Celene Squibb, MD   Admit date - 03/02/2019   LOS - 3  Chief Complaint  Patient presents with   Shortness of Breath       Brief Narrative: Patient is a 73 y.o. male with PMHx of HFpEF (02/2018 EF 65%)and CAD (05/2014 RCA & LCX PCI), liver abscess s/p percutaneous JP drain placement (at [facility]) on 10/18 (Cipro through 03/14/19), PAF on anticoagulation-who was Covid positive at [facility] on 10/26 and 11/1-subsequently discharged home-who presented to [facility] emergency department with gradual worsening shortness of breath-found to have acute hypoxic respiratory failure secondary to COVID-19 pneumonia, subsequently transferred to triad hospitalist service at [facility].  Significant Events: 10/26 Covid positive at [facility] 11/1 CTA chest [place] consistent with Covid pneumonitis 11/4 admit to [facility] via [facility]   Subjective:   Patient in bed, appears comfortable, denies any headache, no fever, no chest pain or pressure, no shortness of breath , no abdominal pain. No focal weakness.   Assessment  & Plan :   Acute Hypoxic Resp Failure due to Covid 19 Viral pneumonia: He was appropriately started on IV steroids and remdesivir combination with good effect, initially requiring 6 L nasal cannula oxygen now down to 3 L.  Clinically improving.  Inflammatory markers improving as well except for D-dimer.   COVID-19 Labs: Recent Labs    03/02/19 1038 03/03/19 0155 03/04/19 0601 03/05/19 0620  DDIMER 19.80* >20.00* >20.00*  --   FERRITIN 507* 493* 473*  --   LDH 370*  --   --   --    CRP 24* 19.9* 9.7* 4.7*    Lab Results  Component Value Date   SARSCOV2NAA POSITIVE (A) 03/02/2019   SARSCOV2NAA NOT DETECTED 02/06/2019     COVID-19 Medications: Steroids: 11/4>> Remdesivir: 11/4>> Actemra: Not given due to ongoing hepatic abscess Convalescent Plasma: 11/5 x 1 Research Studies:[name]    Markedly elevated D-dimer: Likely due to intense underlying inflammation however  DVT cannot be ruled out, lower extremity venous duplex is negative, he is already on Eliquis we will transition him to Lovenox till D-dimer trends down.  Monitor clinically.  If develops worsening hypoxia will consider CTA chest.  Paroxysmal atrial fibrillation with acute RVR: Currently in mild RVR added Cardizem due to metoprolol allergy, anticoagulation as above, last echo in chart shows a preserved EF of 55 to 60%.  Hepatic abscess: Status post percutaneous drain placement 10/18 -treated with Rocephin and Flagyl -was to have repeat imaging at [facility] with consideration being given to removing his drain at that time -drain appears benign at present -continue Cipro/Flagyl.    CAD - PCI to RCA and LCx February 2016  Hypothyroidism: Synthroid  Debility/deconditioning: Secondary to acute illness-PT/OT ordered.  Obesity: BMI of 33.  Follow with PCP for weight loss.   Condition - Extremely Guarded  Family Communication  : Son updated over the phone on 11/6, 11/7  Code Status :  Full Code  Diet :  Diet Order            Diet regular Room service appropriate? Yes; Fluid consistency: Thin  Diet effective now               Disposition Plan  :  Remain hospitalized  Barriers to discharge: Hypoxia requiring O2 supplementation/complete 5 days of IV Remdesivir  Consults  :  None  Procedures  :  None  Antibiotics  :    Anti-infectives (From admission, onward)   Start     Dose/Rate Route Frequency Ordered Stop   03/03/19 1600  remdesivir 100 mg in sodium chloride 0.9 % 250 mL IVPB     100  mg 500 mL/hr over 30 Minutes Intravenous Every 24 hours 03/02/19 2024 03/07/19 1559   03/03/19 1600  metroNIDAZOLE (FLAGYL) tablet 500 mg     500 mg Oral 3 times daily 03/03/19 1248     03/03/19 1300  ciprofloxacin (CIPRO) tablet 500 mg  Status:  Discontinued     500 mg Oral 2 times daily 03/03/19 1248 03/03/19 1249   03/03/19 0200  vancomycin (VANCOCIN) IVPB 1000 mg/200 mL premix  Status:  Discontinued     1,000 mg 200 mL/hr over 60 Minutes Intravenous Every 12 hours 03/02/19 1424 03/02/19 1701   03/02/19 2200  ceFEPIme (MAXIPIME) 2 g in sodium chloride 0.9 % 100 mL IVPB  Status:  Discontinued     2 g 200 mL/hr over 30 Minutes Intravenous Every 8 hours 03/02/19 1310 03/02/19 1701   03/02/19 2200  ciprofloxacin (CIPRO) tablet 500 mg     500 mg Oral 2 times daily 03/02/19 2031     03/02/19 2130  remdesivir 200 mg in sodium chloride 0.9 % 250 mL IVPB     200 mg 500 mL/hr over 30 Minutes Intravenous Once 03/02/19 2024 03/04/19 0120   03/02/19 1430  vancomycin (VANCOCIN) IVPB 1000 mg/200 mL premix     1,000 mg 200 mL/hr over 60 Minutes Intravenous  Once 03/02/19 1420 03/02/19 1659   03/02/19 1215  vancomycin (VANCOCIN) IVPB 1000 mg/200 mL premix     1,000 mg 200 mL/hr over 60 Minutes Intravenous Every 1 hr x 2 03/02/19 1146 03/02/19 1416   03/02/19 1145  ceFEPIme (MAXIPIME) 2 g in sodium chloride 0.9 % 100 mL IVPB     2 g 200 mL/hr over 30 Minutes Intravenous  Once 03/02/19 1136 03/02/19 1315   03/02/19 1145  vancomycin (VANCOCIN) IVPB 1000 mg/200 mL premix  Status:  Discontinued     1,000 mg 200 mL/hr over 60 Minutes Intravenous  Once 03/02/19 1136 03/02/19 1146     DVT Prophylaxis  : Eliquis >> Lovenox  Inpatient Medications  Scheduled Meds:  allopurinol  300 mg Oral q morning - 10a   ciprofloxacin  500 mg Oral BID   dexamethasone (DECADRON) injection  6 mg Intravenous Q24H   diltiazem  90 mg Oral Q8H   levothyroxine  137 mcg Oral QAC breakfast   metroNIDAZOLE  500 mg  Oral TID   multivitamin with minerals  1 tablet Oral Daily   vitamin C  500 mg Oral Daily   zinc sulfate  220 mg Oral Daily   Continuous Infusions:  remdesivir 100 mg in NS 250 mL Stopped (03/05/19 0117)   PRN Meds:.acetaminophen **OR** [DISCONTINUED] acetaminophen, albuterol, chlorpheniramine-HYDROcodone, diltiazem, guaiFENesin-dextromethorphan, [DISCONTINUED] ondansetron **OR** ondansetron (ZOFRAN) IV, oxyCODONE, polyethylene glycol, traZODone   Time Spent in minutes  25  See all Orders from today for further details   Lala Lund M.D on 03/05/2019 at 9:41 AM  To page go to [email or web address] - use universal password  [facility] -  Office  [phone]    Objective:   Vitals:   03/05/19 0600 03/05/19 0726 03/05/19 0730 03/05/19 0750  BP:  (!) 109/96    Pulse:  64 61 72  Resp:  12 (!) 23   Temp:  97.6 F (36.4 C)    TempSrc:  Axillary    SpO2: 94% 92% 94%   Weight:      Height:        Wt Readings from Last 3 Encounters:  03/02/19 113.4 kg  02/06/19 113.4 kg  09/03/17 117 kg     Intake/Output Summary (Last 24 hours) at 03/05/2019 0941 Last data filed at 03/05/2019 0400 Gross per 24 hour  Intake 960 ml  Output 345 ml  Net 615 ml     Physical Exam  Awake Alert, Oriented X 3, No new F.N deficits, Normal affect [place].AT,PERRAL Supple Neck,No JVD, No cervical lymphadenopathy appriciated.  Symmetrical Chest wall movement, Good air movement bilaterally, CTAB iRRR,No Gallops, Rubs or new Murmurs, No Parasternal Heave +ve B.Sounds, Abd Soft, No tenderness, No organomegaly appriciated, No rebound - guarding or rigidity.  Right upper quadrant JP drain in place, No Cyanosis, Clubbing or edema, No new Rash or bruise    Data Review:    CBC Recent Labs  Lab 03/02/19 1038 03/03/19 0155 03/04/19 0601 03/05/19 0620  WBC 7.4 6.0 9.6 10.4  HGB 15.2 13.1 14.4 14.6  HCT 45.8 40.2 43.1 45.0  PLT 150 102* 118* 96*  MCV 98.5 98.0 98.4 98.3  MCH 32.7 32.0 32.9  31.9  MCHC 33.2 32.6 33.4 32.4  RDW 14.1 14.3 14.4 14.6  LYMPHSABS 0.4* 0.4* 0.5* 0.6*  MONOABS 0.6 0.4 0.7 0.7  EOSABS 0.0 0.0 0.0 0.0  BASOSABS 0.0 0.0 0.0 0.0    Chemistries  Recent Labs  Lab 03/02/19 1038 03/03/19 0155 03/04/19 0601 03/05/19 0620  NA 134* 133* 141 141  K 4.1 4.4 4.2 4.0  CL 99 100 102 104  CO2 20* 21* 27 25  GLUCOSE 103* 127* 125* 126*  BUN 22 22 29* 34*  CREATININE 1.12 0.99 0.86 0.94  CALCIUM 8.0* 6.9* 8.2* 8.1*  MG  --  2.2 2.6* 2.4  AST 32 26 33 29  ALT 15 11 19 24   ALKPHOS 44 38 55 66  BILITOT 0.7 0.4 0.8 0.7   ------------------------------------------------------------------------------------------------------------------ Recent Labs  03/02/19 1038  TRIG 62    Lab Results  Component Value Date   HGBA1C 5.8 (H) 08/08/2014   ------------------------------------------------------------------------------------------------------------------ No results for input(s): TSH, T4TOTAL, T3FREE, THYROIDAB in the last 72 hours.  Invalid input(s): FREET3 ------------------------------------------------------------------------------------------------------------------ Recent Labs    03/03/19 0155 03/04/19 0601  FERRITIN 493* 473*    Coagulation profile No results for input(s): INR, PROTIME in the last 168 hours.  Recent Labs    03/03/19 0155 03/04/19 0601  DDIMER >20.00* >20.00*    Cardiac Enzymes No results for input(s): CKMB, TROPONINI, MYOGLOBIN in the last 168 hours.  Invalid input(s): CK ------------------------------------------------------------------------------------------------------------------    Component Value Date/Time   BNP 77.0 03/02/2019 1046    Micro Results Recent Results (from the past 240 hour(s))  SARS Coronavirus 2 by RT PCR (hospital order, performed in [facility] hospital lab) Nasopharyngeal Nasopharyngeal Swab     Status: Abnormal   Collection Time: 03/02/19 11:08 AM   Specimen: Nasopharyngeal Swab   Result Value Ref Range Status   SARS Coronavirus 2 POSITIVE (A) NEGATIVE Final    Comment: RESULT CALLED TO, READ BACK BY AND VERIFIED WITH: R HOLCOLMB AT 1410 BY HFLYNT 03/02/19 (NOTE) If result is NEGATIVE SARS-CoV-2 target nucleic acids are NOT DETECTED. The SARS-CoV-2 RNA is generally detectable in upper and lower  respiratory specimens during the acute phase of infection. The lowest  concentration of SARS-CoV-2 viral copies this assay can detect is 250  copies / mL. A negative result does not preclude SARS-CoV-2 infection  and should not be used as the sole basis for treatment or other  patient management decisions.  A negative result may occur with  improper specimen collection / handling, submission of specimen other  than nasopharyngeal swab, presence of viral mutation(s) within the  areas targeted by this assay, and inadequate number of viral copies  (<250 copies / mL). A negative result must be combined with clinical  observations, patient history, and epidemiological information. If result is POSITIVE SARS-CoV-2 target nucleic acids are DETECTED.  The SARS-CoV-2 RNA is generally detectable in upper and lower  respiratory specimens during the acute phase of infection.  Positive  results are indicative of active infection with SARS-CoV-2.  Clinical  correlation with patient history and other diagnostic information is  necessary to determine patient infection status.  Positive results do  not rule out bacterial infection or co-infection with other viruses. If result is PRESUMPTIVE POSTIVE SARS-CoV-2 nucleic acids MAY BE PRESENT.   A presumptive positive result was obtained on the submitted specimen  and confirmed on repeat testing.  While 2019 novel coronavirus  (SARS-CoV-2) nucleic acids may be present in the submitted sample  additional confirmatory testing may be necessary for epidemiological  and / or clinical management purposes  to differentiate between  SARS-CoV-2  and other Sarbecovirus currently known to infect humans.  If clinically indicated additional testing with an alternate test  methodology [phone])  is advised. The SARS-CoV-2 RNA is generally  detectable in upper and lower respiratory specimens during the acute  phase of infection. The expected result is Negative. Fact Sheet for Patients:  StrictlyIdeas.no Fact Sheet for Healthcare Providers: BankingDealers.co.za This test is not yet approved or cleared by the [place] FDA and has been authorized for detection and/or diagnosis of SARS-CoV-2 by FDA under an Emergency Use Authorization (EUA).  This EUA will remain in effect (meaning this test can be used) for the duration of the COVID-19 declaration under Section 564(b)(1) of the Act, 21 U.S.C. section  360bbb-3(b)(1), unless the authorization is terminated or revoked sooner. Performed at [facility], [address], [place], [place] [address]   Blood Culture (routine x 2)     Status: None (Preliminary result)   Collection Time: 03/02/19 11:37 AM   Specimen: BLOOD LEFT ARM  Result Value Ref Range Status   Specimen Description BLOOD LEFT ARM  Final   Special Requests   Final    BOTTLES DRAWN AEROBIC AND ANAEROBIC Blood Culture adequate volume   Culture   Final    NO GROWTH 3 DAYS Performed at [facility], [address], [place], [place] [address]    Report Status PENDING  Incomplete  Blood Culture (routine x 2)     Status: None (Preliminary result)   Collection Time: 03/02/19 11:37 AM   Specimen: BLOOD LEFT ARM  Result Value Ref Range Status   Specimen Description BLOOD LEFT ARM  Final   Special Requests   Final    BOTTLES DRAWN AEROBIC AND ANAEROBIC Blood Culture adequate volume   Culture   Final    NO GROWTH 3 DAYS Performed at [facility], [address], [place], [place] [address]    Report Status PENDING  Incomplete    Radiology Reports Dg Chest 2 View  Result Date:  02/06/2019 CLINICAL DATA:  Onset fever and right flank pain 02/03/2019. EXAM: CHEST - 2 VIEW COMPARISON:  PA and lateral chest 04/03/2017. FINDINGS: Lungs clear. Heart size normal. No pneumothorax or pleural fluid. Atherosclerosis noted. No acute or focal bony abnormality. IMPRESSION: No acute disease. Atherosclerosis. Electronically Signed   By: Inge Rise M.D.   On: 02/06/2019 15:56   Dg Pelvis Portable  Result Date: 03/02/2019 CLINICAL DATA:  Right hip pain.  No injury. EXAM: PORTABLE PELVIS 1-2 VIEWS COMPARISON:  CT abdomen pelvis dated February 06, 2019. FINDINGS: No acute fracture or dislocation. Prior left proximal femur ORIF. Unchanged mild bilateral hip joint space narrowing with small marginal osteophytes. The pubic symphysis and sacroiliac joints are unremarkable. Lower lumbar spondylosis. Bone mineralization is normal. Soft tissues are unremarkable. IMPRESSION: 1.  No acute osseous abnormality. 2. Unchanged mild bilateral hip degenerative changes. Electronically Signed   By: Titus Dubin M.D.   On: 03/02/2019 11:27   Dg Chest Port 1 View  Result Date: 03/04/2019 CLINICAL DATA:  COVID-19 pneumonia. EXAM: PORTABLE CHEST 1 VIEW COMPARISON:  03/03/2019 FINDINGS: Lordotic technique demonstrated. Lungs are adequately inflated with slight overall worsening bilateral patchy airspace process left lung worse than right. No definite effusion. Cardiomediastinal silhouette and remainder of the exam is unchanged. IMPRESSION: Slight interval worsening of diffuse bilateral patchy airspace process compatible with patient's known COVID-19 pneumonia. Electronically Signed   By: Marin Olp M.D.   On: 03/04/2019 09:00   Dg Chest Port 1 View  Result Date: 03/03/2019 CLINICAL DATA:  Shortness of breath. EXAM: PORTABLE CHEST 1 VIEW COMPARISON:  03/02/2019 FINDINGS: Low lung volumes with interstitial and airspace opacities unchanged compared to prior study. Worse in the peripheral left chest.  Cardiomediastinal contours are stable. No signs of acute bone finding with evidence of prior cervicothoracic spinal fusion, partially imaged. IMPRESSION: Findings remain concerning for viral or atypical pneumonia. Asymmetric edema could have a similar appearance. Electronically Signed   By: Zetta Bills M.D.   On: 03/03/2019 11:43   Dg Chest Port 1 View  Result Date: 03/02/2019 CLINICAL DATA:  Severe shortness of breath. EXAM: PORTABLE CHEST 1 VIEW COMPARISON:  Chest x-ray dated February 06, 2019. FINDINGS: The heart size and  mediastinal contours are within normal limits. Normal pulmonary vascularity. Patchy asymmetric peripheral opacities in the left greater than right mid to lower lungs. No pleural effusion or pneumothorax. Unchanged elevation of the right hemidiaphragm. No acute osseous abnormality. IMPRESSION: 1. There are findings in the lungs which are nonspecific, but concerning for atypical infection, including potential viral pneumonia. Electronically Signed   By: Titus Dubin M.D.   On: 03/02/2019 11:25   Ct Renal Stone Study  Result Date: 02/06/2019 CLINICAL DATA:  Flank pain. Fever. Right flank pain since Thursday. EXAM: CT ABDOMEN AND PELVIS WITHOUT CONTRAST TECHNIQUE: Multidetector CT imaging of the abdomen and pelvis was performed following the standard protocol without IV contrast. COMPARISON:  12/30/2012 FINDINGS: Lower chest: Right hemidiaphragm elevation. Normal heart size without pericardial or pleural effusion. Multivessel coronary artery atherosclerosis. Hepatobiliary: Moderate hepatic steatosis. Right hepatic lobe cysts, maximally 4.5 cm. Pneumobilia. Cholecystectomy, without biliary ductal dilatation. Pancreas: Normal, without mass or ductal dilatation. Spleen: Normal in size, without focal abnormality. Adrenals/Urinary Tract: Normal adrenal glands. Mild renal cortical thinning bilaterally. 2.2 cm upper/interpolar left renal cyst. Left renal vascular calcification. No renal  calculi or hydronephrosis. No hydroureter or ureteric calculi. No bladder calculi. Stomach/Bowel: Normal stomach, without wall thickening. Scattered colonic diverticula. Normal terminal ileum and appendix. Normal small bowel. Vascular/Lymphatic: Aortic and branch vessel atherosclerosis. Nonaneurysmal dilatation of the infrarenal aorta at 3.0 cm. No abdominopelvic adenopathy. Reproductive: Mild prostatomegaly. Other: No significant free fluid. Musculoskeletal: Proximal left femoral fixation. Lumbosacral spondylosis. 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 IMPRESSION: 1.  No urinary tract calculi or hydronephrosis. 2. Hepatic steatosis. 3.  Aortic Atherosclerosis (ICD10-I70.0). 4. Prostatomegaly. Electronically Signed   By: Abigail Miyamoto M.D.   On: 02/06/2019 15:59   Vas [place] Lower Extremity Venous (dvt)  Result Date: 03/05/2019  Lower Venous Study Other Indications: Covid Positive, elevated d-diimer. Comparison Study: No priors Performing Technologist: Velva Harman Sturdivant RDMS, RVT  Examination Guidelines: A complete evaluation includes B-mode imaging, spectral Doppler, color Doppler, and power Doppler as needed of all accessible portions of each vessel. Bilateral testing is considered an integral part of a complete examination. Limited examinations for reoccurring indications may be performed as noted.  +---------+---------------+---------+-----------+----------+--------------+  RIGHT     Compressibility Phasicity Spontaneity Properties Thrombus Aging  +---------+---------------+---------+-----------+----------+--------------+  CFV       Full            Yes       Yes                                    +---------+---------------+---------+-----------+----------+--------------+  SFJ       Full                                                             +---------+---------------+---------+-----------+----------+--------------+  FV Prox   Full                                                              +---------+---------------+---------+-----------+----------+--------------+  FV Mid    Full                                                             +---------+---------------+---------+-----------+----------+--------------+  FV Distal Full                                                             +---------+---------------+---------+-----------+----------+--------------+  PFV       Full                                                             +---------+---------------+---------+-----------+----------+--------------+  POP       Full            Yes       Yes                                    +---------+---------------+---------+-----------+----------+--------------+  PTV       Full                                                             +---------+---------------+---------+-----------+----------+--------------+  PERO      Full                                                             +---------+---------------+---------+-----------+----------+--------------+   +---------+---------------+---------+-----------+----------+--------------+  LEFT      Compressibility Phasicity Spontaneity Properties Thrombus Aging  +---------+---------------+---------+-----------+----------+--------------+  CFV       Full            Yes       Yes                                    +---------+---------------+---------+-----------+----------+--------------+  SFJ       Full                                                             +---------+---------------+---------+-----------+----------+--------------+  FV Prox   Full                                                             +---------+---------------+---------+-----------+----------+--------------+  FV Mid    Full                                                             +---------+---------------+---------+-----------+----------+--------------+  FV Distal Full                                                              +---------+---------------+---------+-----------+----------+--------------+  PFV       Full                                                             +---------+---------------+---------+-----------+----------+--------------+  POP       Full            Yes       Yes                                    +---------+---------------+---------+-----------+----------+--------------+  PTV       Full                                                             +---------+---------------+---------+-----------+----------+--------------+  PERO      Full                                                             +---------+---------------+---------+-----------+----------+--------------+     Summary: Right: There is no evidence of deep vein thrombosis in the lower extremity. No cystic structure found in the popliteal fossa. Left: There is no evidence of deep vein thrombosis in the lower extremity. No cystic structure found in the popliteal fossa.  *See table(s) above for measurements and observations. Electronically signed by Curt Jews MD on 03/05/2019 at 7:06:10 AM.    Final

## 2019-03-05 NOTE — Evaluation (Addendum)
Occupational Therapy Evaluation Patient Details Name: GAELEN RAYAS MRN: [id number] DOB: [birth year] Today's Date: 03/05/2019    History of Present Illness 73 y/o male w/ hx of renal disorder, persistent a -fib, HTn, hypothyroidism, gout, kidney stones, GERD, femur fx, dyrhythmia, CAD, BPH, arthritis, R TKA 2015, cervical spine surgery, TEE w/o cardioversion, diagnosed as Covid positive at [facility] 10/26 at which time a chest x-ray noted opacities bilaterally, able to go home, re-evaluated on 11/01 w/ worsening SOB again able to go home, presented to ED 11/04 and was found to be hypoxic hence admitted to [facility].   Clinical Impression   Pt admitted with above diagnoses, presenting with generalized weakness and cardiopulmonary compromise limiting BADL engagement. PTA pt reports living alone, independent with BADL and some IADL. He reports baseline SOB and afib and that he has to complete some tasks at a slower pace. At time of eval, pt is very anxious- stating that he knows he is going to de sat and panic the moment he moves. On 3L Tony throughout session. Saturations in mid 90s to low 90s with activity. HR up to 150 bpm, suspect this baseline, RN in room at end of session for meds. Utilized relaxation techniques, pursed lip breathing education, and reassurance and pt was very receptive and able to engage well in session. He completed stand pivot BSC transfer at min guard. Cues for pursed lip breathing while having BM. Pt able to complete hygiene with set up assist and x2 rest breaks to maintain saturations. At end of session, pt reports that was "the best I have moved yet" and attributes it to new breathing techniques learned. At this time, recommend SNF at d/c but pt may progress to [facility] level depending on his activity tolerance and home support. Will continue to follow per POC Listed below.     Follow Up Recommendations  SNF;Home health OT;Other (comment)(depending progress and tolerance for activity)     Equipment Recommendations  3 in 1 bedside commode    Recommendations for Other Services       Precautions / Restrictions Precautions Precautions: Fall Precaution Comments: watch sats with activity Restrictions Weight Bearing Restrictions: No      Mobility Bed Mobility               General bed mobility comments: pt up in recliner, states he sleeps here as well  Transfers Overall transfer level: Needs assistance Equipment used: 1 person hand held assist Transfers: Stand Pivot Transfers   Stand pivot transfers: Min guard       General transfer comment: min guard to [facility]    Balance Overall balance assessment: Needs assistance Sitting-balance support: Feet supported Sitting balance-Leahy Scale: Good     Standing balance support: Single extremity supported;During functional activity Standing balance-Leahy Scale: Fair                             ADL either performed or assessed with clinical judgement   ADL Overall ADL's : Needs assistance/impaired Eating/Feeding: Set up;Sitting   Grooming: Set up;Sitting Grooming Details (indicate cue type and reason): easily de sats with standing Upper Body Bathing: Minimal assistance;Sitting   Lower Body Bathing: Minimal assistance;Sit to/from stand;Sitting/lateral leans   Upper Body Dressing : Set up;Sitting   Lower Body Dressing: Minimal assistance;Sit to/from stand;Sitting/lateral leans   Toilet Transfer: Min Statistician Details (indicate cue type and reason): from recliner to [facility], pt complete stand pivot without physical assist Toileting-  Clothing Manipulation and Hygiene: Min guard;Sitting/lateral lean Toileting - Clothing Manipulation Details (indicate cue type and reason): leaning with x2 rest breaks to complete task Tub/ Shower Transfer: Minimal assistance;Shower seat;Ambulation;Grab bars;Rolling walker   Functional mobility during ADLs: Min guard(stand pivot only) General  ADL Comments: pt limited by decrased activity tolerance and compromised cardiopulm status for BADL tolerance     Vision Patient Visual Report: No change from baseline       Perception     Praxis      Pertinent Vitals/Pain Pain Assessment: No/denies pain     Hand Dominance     Extremity/Trunk Assessment Upper Extremity Assessment Upper Extremity Assessment: Generalized weakness   Lower Extremity Assessment Lower Extremity Assessment: Defer to PT evaluation       Communication Communication Communication: No difficulties   Cognition Arousal/Alertness: Awake/alert Behavior During Therapy: Anxious Overall Cognitive Status: Within Functional Limits for tasks assessed                                 General Comments: pt very anxious about O2 sats dropping and states "I will panic". RElaxation techniques used as well as encouragement and increased time for pt to gain control and anxiety decreases. Pt also benefitted from less emphasis on O2 readings frequently, he fixates on numbers and becomes increasingly anxious   General Comments       Exercises     Shoulder Instructions      Home Living Family/patient expects to be discharged to:: Private residence Living Arrangements: Alone Available Help at Discharge: Family Type of Home: House Home Access: Ramped entrance     [place]: One level     Bathroom Shower/Tub: Tub/shower unit                    Prior Functioning/Environment Level of Independence: Independent        Comments: states he was independent at home with BADL, mows lawn. Does share he is SOB with afib at baseline and has to "take it easy"        OT Problem List: Decreased strength;Decreased knowledge of use of DME or AE;Obesity;Decreased activity tolerance;Cardiopulmonary status limiting activity;Impaired balance (sitting and/or standing)      OT Treatment/Interventions: Self-care/ADL training;Therapeutic  exercise;Patient/family education;Balance training;Energy conservation;Therapeutic activities;DME and/or AE instruction    OT Goals(Current goals can be found in the care plan section) Acute Rehab OT Goals Patient Stated Goal: back to living independently OT Goal Formulation: With patient Time For Goal Achievement: 03/19/19 Potential to Achieve Goals: Good  OT Frequency: Min 2X/week   Barriers to D/C:            Co-evaluation              AM-PAC OT "6 Clicks" Daily Activity     Outcome Measure Help from another person eating meals?: A Little Help from another person taking care of personal grooming?: A Little Help from another person toileting, which includes using toliet, bedpan, or urinal?: A Little Help from another person bathing (including washing, rinsing, drying)?: A Little Help from another person to put on and taking off regular upper body clothing?: A Little Help from another person to put on and taking off regular lower body clothing?: A Little 6 Click Score: 18   End of Session Equipment Utilized During Treatment: Oxygen Nurse Communication: Mobility status  Activity Tolerance: Patient tolerated treatment well Patient left: in chair;with call bell/phone  within reach;with nursing/sitter in room  OT Visit Diagnosis: Unsteadiness on feet (R26.81);Other abnormalities of gait and mobility (R26.89);Muscle weakness (generalized) (M62.81);Other (comment)(decreased cardiopulmonary status decreasing BADL tolerance)                Time: [id number] OT Time Calculation (min): 38 min Charges:  OT General Charges $OT Visit: 1 Visit OT Evaluation $OT Eval Moderate Complexity: 1 Mod OT Treatments $Self Care/Home Management : 23-37 mins  Zenovia Jarred, MSOT, OTR/L Behavioral Health OT/ Acute Relief OT [place] Office: [place]: [id number]  Zenovia Jarred 03/05/2019, 12:06 PM

## 2019-03-06 LAB — MAGNESIUM: Magnesium: 2.4 mg/dL (ref 1.7–2.4)

## 2019-03-06 LAB — CBC WITH DIFFERENTIAL/PLATELET
Abs Immature Granulocytes: 0.11 10*3/uL — ABNORMAL HIGH (ref 0.00–0.07)
Basophils Absolute: 0 10*3/uL (ref 0.0–0.1)
Basophils Relative: 0 %
Eosinophils Absolute: 0 10*3/uL (ref 0.0–0.5)
Eosinophils Relative: 0 %
HCT: 43.7 % (ref 39.0–52.0)
Hemoglobin: 14 g/dL (ref 13.0–17.0)
Immature Granulocytes: 1 %
Lymphocytes Relative: 7 %
Lymphs Abs: 0.7 10*3/uL (ref 0.7–4.0)
MCH: 31.8 pg (ref 26.0–34.0)
MCHC: 32 g/dL (ref 30.0–36.0)
MCV: 99.3 fL (ref 80.0–100.0)
Monocytes Absolute: 0.7 10*3/uL (ref 0.1–1.0)
Monocytes Relative: 7 %
Neutro Abs: 8 10*3/uL — ABNORMAL HIGH (ref 1.7–7.7)
Neutrophils Relative %: 85 %
Platelets: 108 10*3/uL — ABNORMAL LOW (ref 150–400)
RBC: 4.4 MIL/uL (ref 4.22–5.81)
RDW: 14.6 % (ref 11.5–15.5)
WBC: 9.5 10*3/uL (ref 4.0–10.5)
nRBC: 0 % (ref 0.0–0.2)

## 2019-03-06 LAB — COMPREHENSIVE METABOLIC PANEL
ALT: 22 U/L (ref 0–44)
AST: 25 U/L (ref 15–41)
Albumin: 2.8 g/dL — ABNORMAL LOW (ref 3.5–5.0)
Alkaline Phosphatase: 62 U/L (ref 38–126)
Anion gap: 12 (ref 5–15)
BUN: 31 mg/dL — ABNORMAL HIGH (ref 8–23)
CO2: 26 mmol/L (ref 22–32)
Calcium: 8.3 mg/dL — ABNORMAL LOW (ref 8.9–10.3)
Chloride: 102 mmol/L (ref 98–111)
Creatinine, Ser: 0.93 mg/dL (ref 0.61–1.24)
GFR calc Af Amer: >60 mL/min (ref >60)
GFR calc non Af Amer: >60 mL/min (ref >60)
Glucose, Bld: 116 mg/dL — ABNORMAL HIGH (ref 70–99)
Potassium: 4.5 mmol/L (ref 3.5–5.1)
Sodium: 140 mmol/L (ref 135–145)
Total Bilirubin: 0.6 mg/dL (ref 0.3–1.2)
Total Protein: 6.7 g/dL (ref 6.5–8.1)

## 2019-03-06 LAB — D-DIMER, QUANTITATIVE: D-Dimer, Quant: >20 ug/mL-FEU — ABNORMAL HIGH (ref 0.00–0.50)

## 2019-03-06 LAB — TYPE AND SCREEN
ABO/RH(D): A POS
Antibody Screen: NEGATIVE

## 2019-03-06 LAB — BRAIN NATRIURETIC PEPTIDE: B Natriuretic Peptide: 32.1 pg/mL (ref 0.0–100.0)

## 2019-03-06 LAB — C-REACTIVE PROTEIN: CRP: 2.9 mg/dL — ABNORMAL HIGH (ref <1.0)

## 2019-03-06 MED ORDER — LORAZEPAM 2 MG/ML IJ SOLN
1.0000 mg | Freq: Once | INTRAMUSCULAR | Status: AC
  Administered 2019-03-06: 1 mg via INTRAVENOUS
  Filled 2019-03-06: qty 1

## 2019-03-06 NOTE — Progress Notes (Signed)
PROGRESS NOTE                                                                                                                                                                                                             Patient Demographics:    Wayne Ortiz, is a 73 y.o. male, DOB - 1945-10-02, SH:[id number]  Outpatient Primary MD for the patient is Celene Squibb, MD   Admit date - 03/02/2019   LOS - 4  Chief Complaint  Patient presents with   Shortness of Breath       Brief Narrative: Patient is a 73 y.o. 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  Significant Events: 10/26 Covid positive at [facility] 11/1 CTA chest [place] consistent with Covid pneumonitis 11/4 admit to [facility] via [facility]   Subjective:   Patient in bed, appears comfortable, denies any headache, no fever, no chest pain or pressure, no shortness of breath , no abdominal pain. No focal weakness.   Assessment  & Plan :   Acute Hypoxic Resp Failure due to Covid 19 Viral pneumonia: He was appropriately started on IV steroids and remdesivir combination with good effect, initially requiring 6 L nasal cannula oxygen now down to 3-4 L.  Clinically improving.  Inflammatory markers improving as well except for D-dimer.  SpO2: (!) 88 % O2 Flow Rate (L/min): 4 L/min FiO2 (%): 100 %   COVID-19 Labs: Recent Labs    03/04/19 0601 03/05/19 0620 03/06/19 0009  DDIMER >20.00* >20.00* >20.00*    FERRITIN 473*  --   --   CRP 9.7* 4.7* 2.9*    Lab Results  Component Value Date   SARSCOV2NAA POSITIVE (A) 03/02/2019   SARSCOV2NAA NOT DETECTED 02/06/2019     COVID-19 Medications: Steroids: 11/4>> Remdesivir: 11/4>> Actemra: Not given due to ongoing hepatic abscess Convalescent Plasma: 11/5 x 1    Markedly elevated D-dimer: Likely due to intense underlying inflammation however DVT cannot be  ruled out, lower extremity venous duplex is negative, he is already on Eliquis we will transition him to Lovenox till D-dimer trends down.  Monitor clinically.  If develops worsening hypoxia will consider CTA chest.  Paroxysmal atrial fibrillation with acute RVR: Currently in mild RVR added Cardizem due to metoprolol allergy, anticoagulation as above, last echo in chart shows a preserved EF of 55 to 60%.  Hepatic abscess: Status post percutaneous drain placement 10/18 -treated with Rocephin and Flagyl -was to have repeat imaging at [facility] with consideration being given to removing his drain at that time -drain appears benign at present -continue Cipro/Flagyl.    CAD - PCI to RCA and LCx February 2016  Hypothyroidism: Synthroid  Debility/deconditioning: Secondary to acute illness-PT/OT ordered.  Obesity: BMI of 33.  Follow with PCP for weight loss.    Condition - Extremely Guarded  Family Communication  : Son updated over the phone on 11/6, 11/7  Code Status :  Full Code  Diet :  Diet Order            Diet regular Room service appropriate? Yes; Fluid consistency: Thin  Diet effective now               Disposition Plan  :  Remain hospitalized  Barriers to discharge: Hypoxia requiring O2 supplementation/complete 5 days of IV Remdesivir, 03/07/19  Consults  :  None  Procedures  :  None  Antibiotics  :    Anti-infectives (From admission, onward)   Start     Dose/Rate Route Frequency Ordered Stop   03/03/19 1600  remdesivir 100 mg in sodium chloride 0.9 % 250 mL IVPB      100 mg 500 mL/hr over 30 Minutes Intravenous Every 24 hours 03/02/19 2024 03/07/19 1559   03/03/19 1600  metroNIDAZOLE (FLAGYL) tablet 500 mg     500 mg Oral 3 times daily 03/03/19 1248     03/03/19 1300  ciprofloxacin (CIPRO) tablet 500 mg  Status:  Discontinued     500 mg Oral 2 times daily 03/03/19 1248 03/03/19 1249   03/03/19 0200  vancomycin (VANCOCIN) IVPB 1000 mg/200 mL premix  Status:  Discontinued     1,000 mg 200 mL/hr over 60 Minutes Intravenous Every 12 hours 03/02/19 1424 03/02/19 1701   03/02/19 2200  ceFEPIme (MAXIPIME) 2 g in sodium chloride 0.9 % 100 mL IVPB  Status:  Discontinued     2 g 200 mL/hr over 30 Minutes Intravenous Every 8 hours 03/02/19 1310 03/02/19 1701   03/02/19 2200  ciprofloxacin (CIPRO) tablet 500 mg     500 mg Oral 2 times daily 03/02/19 2031     03/02/19 2130  remdesivir 200 mg in sodium chloride 0.9 % 250 mL IVPB     200 mg 500 mL/hr over 30 Minutes Intravenous Once 03/02/19 2024 03/04/19 0120   03/02/19 1430  vancomycin (VANCOCIN) IVPB 1000 mg/200 mL premix     1,000 mg 200 mL/hr over 60 Minutes Intravenous  Once 03/02/19 1420 03/02/19 1659   03/02/19 1215  vancomycin (VANCOCIN) IVPB 1000 mg/200 mL premix     1,000 mg 200 mL/hr over 60 Minutes Intravenous Every 1 hr x 2 03/02/19 1146 03/02/19 1416   03/02/19 1145  ceFEPIme (MAXIPIME) 2 g in sodium chloride 0.9 % 100 mL IVPB     2 g 200 mL/hr over 30 Minutes Intravenous  Once 03/02/19 1136 03/02/19 1315   03/02/19 1145  vancomycin (VANCOCIN) IVPB 1000 mg/200 mL premix  Status:  Discontinued     1,000 mg 200 mL/hr over 60 Minutes Intravenous  Once 03/02/19 1136 03/02/19 1146     DVT Prophylaxis  : Eliquis >> Lovenox  Inpatient Medications  Scheduled Meds:  allopurinol  300 mg Oral q morning - 10a   ciprofloxacin  500 mg Oral BID   dexamethasone (DECADRON) injection  6 mg Intravenous Q24H   diltiazem  90 mg Oral Q8H   enoxaparin (LOVENOX) injection  115 mg Subcutaneous Q12H    levothyroxine  137 mcg Oral QAC breakfast   metroNIDAZOLE  500 mg Oral TID   multivitamin with minerals  1 tablet Oral Daily   vitamin C  500 mg Oral Daily   zinc sulfate  220 mg Oral Daily   Continuous Infusions:  remdesivir 100 mg in NS 250 mL Stopped (03/05/19 1900)   PRN Meds:.acetaminophen **OR** [DISCONTINUED] acetaminophen, albuterol, chlorpheniramine-HYDROcodone, diltiazem, guaiFENesin-dextromethorphan, [DISCONTINUED] ondansetron **OR** ondansetron (ZOFRAN) IV, oxyCODONE, polyethylene glycol, traZODone   Time Spent in minutes  25  See all Orders from today for further details   Lala Lund M.D on 03/06/2019 at 9:09 AM  To page go to [email or web address] - use universal password  [facility] -  Office  [phone]    Objective:   Vitals:   03/06/19 0451 03/06/19 0719 03/06/19 0800 03/06/19 0824  BP: 114/68 127/82    Pulse:  62 70   Resp:  15 (!) 23   Temp:  98.8 F (37.1 C)    TempSrc:  Axillary    SpO2:  97% (!) 89% (!) 88%  Weight:      Height:        Wt Readings from Last 3 Encounters:  03/02/19 113.4 kg  02/06/19 113.4 kg  09/03/17 117 kg     Intake/Output Summary (Last 24 hours) at 03/06/2019 0909 Last data filed at 03/06/2019 0400 Gross per 24 hour  Intake --  Output 402 ml  Net -402 ml     Physical Exam  Awake Alert, Oriented X 3, No new F.N deficits, Normal affect [place].AT,PERRAL Supple Neck,No JVD, No cervical lymphadenopathy appriciated.  Symmetrical Chest wall movement, Good air movement bilaterally, CTAB RRR,No Gallops, Rubs or new Murmurs, No Parasternal Heave +ve B.Sounds, Abd Soft, No tenderness, No organomegaly appriciated, No rebound - guarding or rigidity. No Cyanosis, Clubbing or edema, Right upper quadrant JP drain in place,   Data Review:    CBC Recent Labs  Lab 03/02/19 1038 03/03/19 0155 03/04/19 0601 03/05/19 0620 03/06/19 0009  WBC 7.4 6.0 9.6 10.4 9.5  HGB 15.2 13.1 14.4 14.6 14.0  HCT 45.8 40.2 43.1 45.0  43.7  PLT 150 102* 118* 96* 108*  MCV 98.5 98.0 98.4 98.3 99.3  MCH 32.7 32.0 32.9 31.9 31.8  MCHC 33.2 32.6 33.4 32.4 32.0  RDW 14.1 14.3 14.4 14.6 14.6  LYMPHSABS 0.4* 0.4* 0.5* 0.6* 0.7  MONOABS 0.6 0.4 0.7 0.7 0.7  EOSABS 0.0 0.0 0.0 0.0 0.0  BASOSABS 0.0 0.0 0.0 0.0 0.0    Chemistries  Recent Labs  Lab 03/02/19 1038 03/03/19 0155 03/04/19 0601 03/05/19 0620 03/06/19 0009  NA 134* 133* 141 141 140  K 4.1 4.4 4.2 4.0 4.5  CL 99 100 102 104 102  CO2 20* 21* 27 25 26   GLUCOSE 103* 127* 125* 126* 116*  BUN 22 22 29* 34* 31*  CREATININE 1.12 0.99 0.86 0.94 0.93  CALCIUM 8.0* 6.9* 8.2* 8.1* 8.3*  MG  --  2.2 2.6* 2.4 2.4  AST 32  26 33 29 25  ALT 15 11 19 24 22   ALKPHOS 44 38 55 66 62  BILITOT 0.7 0.4 0.8 0.7 0.6   ------------------------------------------------------------------------------------------------------------------ No results for input(s): CHOL, HDL, LDLCALC, TRIG, CHOLHDL, LDLDIRECT in the last 72 hours.  Lab Results  Component Value Date   HGBA1C 5.8 (H) 08/08/2014   ------------------------------------------------------------------------------------------------------------------ No results for input(s): TSH, T4TOTAL, T3FREE, THYROIDAB in the last 72 hours.  Invalid input(s): FREET3 ------------------------------------------------------------------------------------------------------------------ Recent Labs    03/04/19 0601  FERRITIN 473*    Coagulation profile No results for input(s): INR, PROTIME in the last 168 hours.  Recent Labs    03/05/19 0620 03/06/19 0009  DDIMER >20.00* >20.00*    Cardiac Enzymes No results for input(s): CKMB, TROPONINI, MYOGLOBIN in the last 168 hours.  Invalid input(s): CK ------------------------------------------------------------------------------------------------------------------    Component Value Date/Time   BNP 32.1 03/06/2019 0009    Micro Results Recent Results (from the past 240 hour(s))    SARS Coronavirus 2 by RT PCR (hospital order, performed in [facility] hospital lab) Nasopharyngeal Nasopharyngeal Swab     Status: Abnormal   Collection Time: 03/02/19 11:08 AM   Specimen: Nasopharyngeal Swab  Result Value Ref Range Status   SARS Coronavirus 2 POSITIVE (A) NEGATIVE Final    Comment: RESULT CALLED TO, READ BACK BY AND VERIFIED WITH: R HOLCOLMB AT 1410 BY HFLYNT 03/02/19 (NOTE) If result is NEGATIVE SARS-CoV-2 target nucleic acids are NOT DETECTED. The SARS-CoV-2 RNA is generally detectable in upper and lower  respiratory specimens during the acute phase of infection. The lowest  concentration of SARS-CoV-2 viral copies this assay can detect is 250  copies / mL. A negative result does not preclude SARS-CoV-2 infection  and should not be used as the sole basis for treatment or other  patient management decisions.  A negative result may occur with  improper specimen collection / handling, submission of specimen other  than nasopharyngeal swab, presence of viral mutation(s) within the  areas targeted by this assay, and inadequate number of viral copies  (<250 copies / mL). A negative result must be combined with clinical  observations, patient history, and epidemiological information. If result is POSITIVE SARS-CoV-2 target nucleic acids are DETECTED.  The SARS-CoV-2 RNA is generally detectable in upper and lower  respiratory specimens during the acute phase of infection.  Positive  results are indicative of active infection with SARS-CoV-2.  Clinical  correlation with patient history and other diagnostic information is  necessary to determine patient infection status.  Positive results do  not rule out bacterial infection or co-infection with other viruses. If result is PRESUMPTIVE POSTIVE SARS-CoV-2 nucleic acids MAY BE PRESENT.   A presumptive positive result was obtained on the submitted specimen  and confirmed on repeat testing.  While 2019 novel coronavirus   (SARS-CoV-2) nucleic acids may be present in the submitted sample  additional confirmatory testing may be necessary for epidemiological  and / or clinical management purposes  to differentiate between  SARS-CoV-2 and other Sarbecovirus currently known to infect humans.  If clinically indicated additional testing with an alternate test  methodology [phone])  is advised. The SARS-CoV-2 RNA is generally  detectable in upper and lower respiratory specimens during the acute  phase of infection. The expected result is Negative. Fact Sheet for Patients:  StrictlyIdeas.no Fact Sheet for Healthcare Providers: BankingDealers.co.za This test is not yet approved or cleared by the [place] FDA and has been authorized for detection and/or diagnosis of SARS-CoV-2 by FDA under  an Emergency Use Authorization (EUA).  This EUA will remain in effect (meaning this test can be used) for the duration of the COVID-19 declaration under Section 564(b)(1) of the Act, 21 U.S.C. section 360bbb-3(b)(1), unless the authorization is terminated or revoked sooner. Performed at [facility], [address] [place], [place] [address]   Blood Culture (routine x 2)     Status: None (Preliminary result)   Collection Time: 03/02/19 11:37 AM   Specimen: BLOOD LEFT ARM  Result Value Ref Range Status   Specimen Description BLOOD LEFT ARM  Final   Special Requests   Final    BOTTLES DRAWN AEROBIC AND ANAEROBIC Blood Culture adequate volume   Culture   Final    NO GROWTH 3 DAYS Performed at [facility], [address] [place], [place] [address]    Report Status PENDING  Incomplete  Blood Culture (routine x 2)     Status: None (Preliminary result)   Collection Time: 03/02/19 11:37 AM   Specimen: BLOOD LEFT ARM  Result Value Ref Range Status   Specimen Description BLOOD LEFT ARM  Final   Special Requests   Final    BOTTLES DRAWN AEROBIC AND ANAEROBIC Blood Culture  adequate volume   Culture   Final    NO GROWTH 3 DAYS Performed at [facility], [address] [place], [place] [address]    Report Status PENDING  Incomplete    Radiology Reports Dg Chest 2 View  Result Date: 02/06/2019 CLINICAL DATA:  Onset fever and right flank pain 02/03/2019. EXAM: CHEST - 2 VIEW COMPARISON:  PA and lateral chest 04/03/2017. FINDINGS: Lungs clear. Heart size normal. No pneumothorax or pleural fluid. Atherosclerosis noted. No acute or focal bony abnormality. IMPRESSION: No acute disease. Atherosclerosis. Electronically Signed   By: Inge Rise M.D.   On: 02/06/2019 15:56   Dg Pelvis Portable  Result Date: 03/02/2019 CLINICAL DATA:  Right hip pain.  No injury. EXAM: PORTABLE PELVIS 1-2 VIEWS COMPARISON:  CT abdomen pelvis dated February 06, 2019. FINDINGS: No acute fracture or dislocation. Prior left proximal femur ORIF. Unchanged mild bilateral hip joint space narrowing with small marginal osteophytes. The pubic symphysis and sacroiliac joints are unremarkable. Lower lumbar spondylosis. Bone mineralization is normal. Soft tissues are unremarkable. IMPRESSION: 1.  No acute osseous abnormality. 2. Unchanged mild bilateral hip degenerative changes. Electronically Signed   By: Titus Dubin M.D.   On: 03/02/2019 11:27   Dg Chest Port 1 View  Result Date: 03/04/2019 CLINICAL DATA:  COVID-19 pneumonia. EXAM: PORTABLE CHEST 1 VIEW COMPARISON:  03/03/2019 FINDINGS: Lordotic technique demonstrated. Lungs are adequately inflated with slight overall worsening bilateral patchy airspace process left lung worse than right. No definite effusion. Cardiomediastinal silhouette and remainder of the exam is unchanged. IMPRESSION: Slight interval worsening of diffuse bilateral patchy airspace process compatible with patient's known COVID-19 pneumonia. Electronically Signed   By: Marin Olp M.D.   On: 03/04/2019 09:00   Dg Chest Port 1 View  Result Date: 03/03/2019 CLINICAL DATA:   Shortness of breath. EXAM: PORTABLE CHEST 1 VIEW COMPARISON:  03/02/2019 FINDINGS: Low lung volumes with interstitial and airspace opacities unchanged compared to prior study. Worse in the peripheral left chest. Cardiomediastinal contours are stable. No signs of acute bone finding with evidence of prior cervicothoracic spinal fusion, partially imaged. IMPRESSION: Findings remain concerning for viral or atypical pneumonia. Asymmetric edema could have a similar appearance. Electronically Signed   By: Zetta Bills M.D.   On: 03/03/2019 11:43  Dg Chest Port 1 View  Result Date: 03/02/2019 CLINICAL DATA:  Severe shortness of breath. EXAM: PORTABLE CHEST 1 VIEW COMPARISON:  Chest x-ray dated February 06, 2019. FINDINGS: The heart size and mediastinal contours are within normal limits. Normal pulmonary vascularity. Patchy asymmetric peripheral opacities in the left greater than right mid to lower lungs. No pleural effusion or pneumothorax. Unchanged elevation of the right hemidiaphragm. No acute osseous abnormality. IMPRESSION: 1. There are findings in the lungs which are nonspecific, but concerning for atypical infection, including potential viral pneumonia. Electronically Signed   By: Titus Dubin M.D.   On: 03/02/2019 11:25   Ct Renal Stone Study  Result Date: 02/06/2019 CLINICAL DATA:  Flank pain. Fever. Right flank pain since Thursday. EXAM: CT ABDOMEN AND PELVIS WITHOUT CONTRAST TECHNIQUE: Multidetector CT imaging of the abdomen and pelvis was performed following the standard protocol without IV contrast. COMPARISON:  12/30/2012 FINDINGS: Lower chest: Right hemidiaphragm elevation. Normal heart size without pericardial or pleural effusion. Multivessel coronary artery atherosclerosis. Hepatobiliary: Moderate hepatic steatosis. Right hepatic lobe cysts, maximally 4.5 cm. Pneumobilia. Cholecystectomy, without biliary ductal dilatation. Pancreas: Normal, without mass or ductal dilatation. Spleen: Normal in  size, without focal abnormality. Adrenals/Urinary Tract: Normal adrenal glands. Mild renal cortical thinning bilaterally. 2.2 cm upper/interpolar left renal cyst. Left renal vascular calcification. No renal calculi or hydronephrosis. No hydroureter or ureteric calculi. No bladder calculi. Stomach/Bowel: Normal stomach, without wall thickening. Scattered colonic diverticula. Normal terminal ileum and appendix. Normal small bowel. Vascular/Lymphatic: Aortic and branch vessel atherosclerosis. Nonaneurysmal dilatation of the infrarenal aorta at 3.0 cm. No abdominopelvic adenopathy. Reproductive: Mild prostatomegaly. Other: No significant free fluid. Musculoskeletal: Proximal left femoral fixation. Lumbosacral spondylosis. 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 IMPRESSION: 1.  No urinary tract calculi or hydronephrosis. 2. Hepatic steatosis. 3.  Aortic Atherosclerosis (ICD10-I70.0). 4. Prostatomegaly. Electronically Signed   By: Abigail Miyamoto M.D.   On: 02/06/2019 15:59   Vas [place] Lower Extremity Venous (dvt)  Result Date: 03/05/2019  Lower Venous Study Other Indications: Covid Positive, elevated d-diimer. Comparison Study: No priors Performing Technologist: Velva Harman Sturdivant RDMS, RVT  Examination Guidelines: A complete evaluation includes B-mode imaging, spectral Doppler, color Doppler, and power Doppler as needed of all accessible portions of each vessel. Bilateral testing is considered an integral part of a complete examination. Limited examinations for reoccurring indications may be performed as noted.  +---------+---------------+---------+-----------+----------+--------------+  RIGHT     Compressibility Phasicity Spontaneity Properties Thrombus Aging  +---------+---------------+---------+-----------+----------+--------------+  CFV       Full            Yes       Yes                                    +---------+---------------+---------+-----------+----------+--------------+  SFJ       Full                                                              +---------+---------------+---------+-----------+----------+--------------+  FV Prox   Full                                                             +---------+---------------+---------+-----------+----------+--------------+  FV Mid    Full                                                             +---------+---------------+---------+-----------+----------+--------------+  FV Distal Full                                                             +---------+---------------+---------+-----------+----------+--------------+  PFV       Full                                                             +---------+---------------+---------+-----------+----------+--------------+  POP       Full            Yes       Yes                                    +---------+---------------+---------+-----------+----------+--------------+  PTV       Full                                                             +---------+---------------+---------+-----------+----------+--------------+  PERO      Full                                                             +---------+---------------+---------+-----------+----------+--------------+   +---------+---------------+---------+-----------+----------+--------------+  LEFT      Compressibility Phasicity Spontaneity Properties Thrombus Aging  +---------+---------------+---------+-----------+----------+--------------+  CFV       Full            Yes       Yes                                    +---------+---------------+---------+-----------+----------+--------------+  SFJ       Full                                                             +---------+---------------+---------+-----------+----------+--------------+  FV Prox   Full                                                             +---------+---------------+---------+-----------+----------+--------------+  FV Mid    Full                                                              +---------+---------------+---------+-----------+----------+--------------+  FV Distal Full                                                             +---------+---------------+---------+-----------+----------+--------------+  PFV       Full                                                             +---------+---------------+---------+-----------+----------+--------------+  POP       Full            Yes       Yes                                    +---------+---------------+---------+-----------+----------+--------------+  PTV       Full                                                             +---------+---------------+---------+-----------+----------+--------------+  PERO      Full                                                             +---------+---------------+---------+-----------+----------+--------------+     Summary: Right: There is no evidence of deep vein thrombosis in the lower extremity. No cystic structure found in the popliteal fossa. Left: There is no evidence of deep vein thrombosis in the lower extremity. No cystic structure found in the popliteal fossa.  *See table(s) above for measurements and observations. Electronically signed by Curt Jews MD on 03/05/2019 at 7:06:10 AM.    Final

## 2019-03-07 LAB — BRAIN NATRIURETIC PEPTIDE: B Natriuretic Peptide: 29.5 pg/mL (ref 0.0–100.0)

## 2019-03-07 LAB — COMPREHENSIVE METABOLIC PANEL
ALT: 22 U/L (ref 0–44)
AST: 23 U/L (ref 15–41)
Albumin: 2.8 g/dL — ABNORMAL LOW (ref 3.5–5.0)
Alkaline Phosphatase: 55 U/L (ref 38–126)
Anion gap: 8 (ref 5–15)
BUN: 29 mg/dL — ABNORMAL HIGH (ref 8–23)
CO2: 27 mmol/L (ref 22–32)
Calcium: 8 mg/dL — ABNORMAL LOW (ref 8.9–10.3)
Chloride: 104 mmol/L (ref 98–111)
Creatinine, Ser: 0.8 mg/dL (ref 0.61–1.24)
GFR calc Af Amer: >60 mL/min (ref >60)
GFR calc non Af Amer: >60 mL/min (ref >60)
Glucose, Bld: 110 mg/dL — ABNORMAL HIGH (ref 70–99)
Potassium: 4.4 mmol/L (ref 3.5–5.1)
Sodium: 139 mmol/L (ref 135–145)
Total Bilirubin: 0.6 mg/dL (ref 0.3–1.2)
Total Protein: 6.3 g/dL — ABNORMAL LOW (ref 6.5–8.1)

## 2019-03-07 LAB — CBC WITH DIFFERENTIAL/PLATELET
Abs Immature Granulocytes: 0.18 10*3/uL — ABNORMAL HIGH (ref 0.00–0.07)
Basophils Absolute: 0 10*3/uL (ref 0.0–0.1)
Basophils Relative: 0 %
Eosinophils Absolute: 0 10*3/uL (ref 0.0–0.5)
Eosinophils Relative: 0 %
HCT: 41.6 % (ref 39.0–52.0)
Hemoglobin: 13.6 g/dL (ref 13.0–17.0)
Immature Granulocytes: 1 %
Lymphocytes Relative: 5 %
Lymphs Abs: 0.7 10*3/uL (ref 0.7–4.0)
MCH: 32.3 pg (ref 26.0–34.0)
MCHC: 32.7 g/dL (ref 30.0–36.0)
MCV: 98.8 fL (ref 80.0–100.0)
Monocytes Absolute: 0.7 10*3/uL (ref 0.1–1.0)
Monocytes Relative: 6 %
Neutro Abs: 11 10*3/uL — ABNORMAL HIGH (ref 1.7–7.7)
Neutrophils Relative %: 88 %
Platelets: 115 10*3/uL — ABNORMAL LOW (ref 150–400)
RBC: 4.21 MIL/uL — ABNORMAL LOW (ref 4.22–5.81)
RDW: 14.7 % (ref 11.5–15.5)
WBC: 12.6 10*3/uL — ABNORMAL HIGH (ref 4.0–10.5)
nRBC: 0 % (ref 0.0–0.2)

## 2019-03-07 LAB — CULTURE, BLOOD (ROUTINE X 2)
Culture: NO GROWTH
Culture: NO GROWTH
Special Requests: ADEQUATE
Special Requests: ADEQUATE

## 2019-03-07 LAB — GLUCOSE, CAPILLARY: Glucose-Capillary: 116 mg/dL — ABNORMAL HIGH (ref 70–99)

## 2019-03-07 LAB — C-REACTIVE PROTEIN: CRP: 1.5 mg/dL — ABNORMAL HIGH (ref <1.0)

## 2019-03-07 LAB — D-DIMER, QUANTITATIVE: D-Dimer, Quant: 13.97 ug/mL-FEU — ABNORMAL HIGH (ref 0.00–0.50)

## 2019-03-07 LAB — MAGNESIUM: Magnesium: 2.3 mg/dL (ref 1.7–2.4)

## 2019-03-07 MED ORDER — ALPRAZOLAM 0.5 MG PO TABS
0.5000 mg | ORAL_TABLET | Freq: Every evening | ORAL | Status: DC | PRN
  Administered 2019-03-07 – 2019-03-09 (×3): 0.5 mg via ORAL
  Filled 2019-03-07 (×3): qty 1

## 2019-03-07 MED ORDER — FUROSEMIDE 10 MG/ML IJ SOLN
40.0000 mg | Freq: Once | INTRAMUSCULAR | Status: AC
  Administered 2019-03-07: 40 mg via INTRAVENOUS
  Filled 2019-03-07: qty 4

## 2019-03-07 MED ORDER — FUROSEMIDE 20 MG PO TABS
40.0000 mg | ORAL_TABLET | Freq: Every day | ORAL | Status: DC
  Administered 2019-03-07 – 2019-03-11 (×5): 40 mg via ORAL
  Filled 2019-03-07 (×5): qty 2

## 2019-03-07 NOTE — NC FL2 (Signed)
[address] LEVEL OF CARE SCREENING TOOL     IDENTIFICATION  Patient Name: THADIUS SCHMAL Birthdate: Mar 14, 1946 Sex: male Admission Date (Current Location): 03/02/2019  [facility] and [place] Number:  Herbalist and Address:  [facility], [place] [address], [place], [place] [address]      Provider Number: [id number]  Attending Physician Name and Address:  Thurnell Lose, MD  Relative Name and Phone Number:  Son: Johnothan Wivell   [phone]    Current Level of Care: Hospital Recommended Level of Care: [place] Prior Approval Number:    Date Approved/Denied:   PASRR Number:    Discharge Plan: SNF    Current Diagnoses: Patient Active Problem List   Diagnosis Date Noted  . Acute respiratory disease due to COVID-19 virus 03/02/2019  . Pneumonia due to COVID-19 virus 03/02/2019  . Acute Hypoxic Respiratory failure, acute due to COVID PNA 03/02/2019  . Liver abscess- s/p JP drain 03/02/2019  . A-fib with RVR 03/15/2015  . Essential hypertension 12/09/2014  . Morbid obesity-BMI 37 12/09/2014  . Hypothyroidism 12/09/2014  . Dyspnea 12/09/2014  . Chronic anticoagulation-Eliquis 12/09/2014  . Persistent atrial fibrillation ([place]) 12/07/2014  . Chest pain 09/13/2014  . Palpitations 09/13/2014  . CAD S/P RCA and CFX DES Feb 2016 09/13/2014  . Coronary artery abnormality 06/22/2014  . Presence of stent in right coronary artery 06/22/2014  . Presence of coronary angioplasty implant and graft 06/22/2014  . Abnormal cardiovascular function study 06/13/2014  . Postoperative stiffness of total knee replacement ([place]) 06/21/2013  . Pain in right knee 06/21/2013  . Difficulty in walking(719.7) 06/21/2013  . S/P right TKA 05/30/2013  . Abdominal pain, right upper quadrant 12/31/2012  . Abnormal transaminases 12/31/2012  . Thrombocytopenia, unspecified ([place]) 12/31/2012    Orientation RESPIRATION BLADDER Height & Weight      Self, Time, Situation, Place  O2(currently on 2L HFNC- being weaned) Continent Weight: 113.4 kg Height:  6' (182.9 cm)  BEHAVIORAL SYMPTOMS/MOOD NEUROLOGICAL BOWEL NUTRITION STATUS      Continent Diet  AMBULATORY STATUS COMMUNICATION OF NEEDS Skin   Limited Assist Verbally Normal(some ecchymotic areas)                       Personal Care Assistance Level of Assistance  Bathing, Dressing Bathing Assistance: Limited assistance   Dressing Assistance: Limited assistance     Functional Limitations Info             SPECIAL CARE FACTORS FREQUENCY  PT (By licensed PT), OT (By licensed OT)     PT Frequency: 5x/week OT Frequency: min3x/week            Contractures Contractures Info: Not present    Additional Factors Info  Allergies Code Status Info: full code Allergies Info: Atrovastatin-arthralgias, lisinopril-chestpain and SOB, Metoprolol- chest pain and SOB,Rocephin-rash           Current Medications (03/07/2019):  This is the current hospital active medication list Current Facility-Administered Medications  Medication Dose Route Frequency Provider Last Rate Last Dose  . acetaminophen (TYLENOL) tablet 650 mg  650 mg Oral Q6H PRN Roxan Hockey, MD   650 mg at 03/05/19 0732  . albuterol (VENTOLIN HFA) 108 (90 Base) MCG/ACT inhaler 2 puff  2 puff Inhalation Q4H PRN Cherene Altes, MD      . allopurinol (ZYLOPRIM) tablet 300 mg  300 mg Oral q morning - 10a Roxan Hockey, MD  300 mg at 03/07/19 0954  . chlorpheniramine-HYDROcodone (TUSSIONEX) 10-8 MG/5ML suspension 5 mL  5 mL Oral Q12H PRN Emokpae, Courage, MD      . ciprofloxacin (CIPRO) tablet 500 mg  500 mg Oral BID [place] Brick, Courage, MD   500 mg at 03/07/19 0955  . dexamethasone (DECADRON) injection 6 mg  6 mg Intravenous Q24H Cherene Altes, MD   6 mg at 03/07/19 0954  . diltiazem (CARDIZEM) injection 10 mg  10 mg Intravenous Q6H PRN Thurnell Lose, MD   10 mg at 03/05/19 1034  . diltiazem  (CARDIZEM) tablet 90 mg  90 mg Oral Q8H Thurnell Lose, MD   90 mg at 03/07/19 0551  . enoxaparin (LOVENOX) injection 115 mg  115 mg Subcutaneous Q12H Shade, Christine E, RPH   115 mg at 03/07/19 1006  . guaiFENesin-dextromethorphan (ROBITUSSIN DM) 100-10 MG/5ML syrup 10 mL  10 mL Oral Q4H PRN Roxan Hockey, MD   10 mL at 03/04/19 2109  . levothyroxine (SYNTHROID) tablet 137 mcg  137 mcg Oral QAC breakfast Cherene Altes, MD   137 mcg at 03/07/19 0551  . metroNIDAZOLE (FLAGYL) tablet 500 mg  500 mg Oral TID Cherene Altes, MD   500 mg at 03/07/19 0955  . multivitamin with minerals tablet 1 tablet  1 tablet Oral Daily Roxan Hockey, MD   1 tablet at 03/07/19 0954  . ondansetron (ZOFRAN) injection 4 mg  4 mg Intravenous Q6H PRN Emokpae, Courage, MD      . oxyCODONE (Oxy IR/ROXICODONE) immediate release tablet 5 mg  5 mg Oral Q4H PRN Jonetta Osgood, MD   5 mg at 03/07/19 0411  . polyethylene glycol (MIRALAX / GLYCOLAX) packet 17 g  17 g Oral Daily PRN Emokpae, Courage, MD      . traZODone (DESYREL) tablet 50 mg  50 mg Oral QHS PRN Roxan Hockey, MD   50 mg at 03/06/19 2127  . vitamin C (ASCORBIC ACID) tablet 500 mg  500 mg Oral Daily Emokpae, Courage, MD   500 mg at 03/07/19 0954  . zinc sulfate capsule 220 mg  220 mg Oral Daily Emokpae, Courage, MD   220 mg at 03/07/19 [id number]     Discharge Medications: Please see discharge summary for a list of discharge medications.  Relevant Imaging Results:  Relevant Lab Results:   Additional Information [id number]  Ninfa Meeker, RN

## 2019-03-07 NOTE — Progress Notes (Signed)
Xanax 0.5mg  PO qHS PRN sleep per Dr Candiss Norse.  Onnie Boer, PharmD, BCIDP, AAHIVP, CPP Infectious Disease Pharmacist 03/07/2019 6:02 PM

## 2019-03-07 NOTE — Progress Notes (Signed)
PROGRESS NOTE                                                                                                                                                                                                             Patient Demographics:    Wayne Ortiz, is a 73 y.o. male, DOB - 1945/10/12, SH:[id number]  Outpatient Primary MD for the patient is Celene Squibb, MD   Admit date - 03/02/2019   LOS - 5  Chief Complaint  Patient presents with   Shortness of Breath       Brief Narrative: Patient is a 73 y.o. 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  Significant Events: 10/26 Covid positive at [facility] 11/1 CTA chest [place] consistent with Covid pneumonitis 11/4 admit to [facility] via [facility]   Subjective:   Patient in bed, appears comfortable, denies any headache, no fever, no chest pain or pressure, no shortness of breath , no abdominal pain. No focal weakness.   Assessment  & Plan :   Acute Hypoxic Resp Failure due to Covid 19 Viral pneumonia: He was appropriately started on IV steroids and remdesivir combination with good effect, initially requiring 6 L nasal cannula oxygen now down to 3-4 L.  Clinically improving.  Inflammatory markers improving as well except for D-dimer.  SpO2: 98 % O2 Flow Rate (L/min): 2 L/min FiO2 (%): 100 %   COVID-19 Labs: Recent Labs    03/05/19 0620 03/06/19 0009 03/07/19 0430  DDIMER >20.00* >20.00* 13.97*  CRP 4.7*  2.9* 1.5*    Lab Results  Component Value Date   SARSCOV2NAA POSITIVE (A) 03/02/2019   SARSCOV2NAA NOT DETECTED 02/06/2019     COVID-19 Medications: Steroids: 11/4>> Remdesivir: 11/4>> Actemra: Not given due to ongoing hepatic abscess Convalescent Plasma: 11/5 x 1    Markedly elevated D-dimer: Likely due to intense underlying inflammation however DVT cannot be ruled out, lower extremity venous duplex is negative, he is already  on Eliquis we will transition him to Lovenox till D-dimer trends down.  Monitor clinically.  If develops worsening hypoxia will consider CTA chest.  Paroxysmal atrial fibrillation with acute RVR: Currently in mild RVR added Cardizem due to metoprolol allergy, anticoagulation as above, last echo in chart shows a preserved EF of 55 to 60%.  Hepatic abscess: Status post percutaneous drain placement 10/18 -treated with Rocephin and Flagyl -was to have repeat imaging at [facility] with consideration being given to removing his drain at that time -drain appears benign at present -continue Cipro/Flagyl.    CAD - PCI to RCA and LCx February 2016, not on any maintenance medications.  Hypothyroidism: Synthroid  Debility/deconditioning: Secondary to acute illness-PT/OT ordered.  Obesity: BMI of 33.  Follow with PCP for weight loss.   Acute on chronic diastolic CHF, baseline EF 60% on echocardiogram done April 2018.  Lasix dependent at home.  Lasix resumed on 03/07/2019.  Will give IV Lasix dose first.  No documented CHF.    Condition - Extremely Guarded  Family Communication  : Son updated over the phone on 11/6, 11/7  Code Status :  Full Code  Diet :  Diet Order            Diet regular Room service appropriate? Yes; Fluid consistency: Thin  Diet effective now               Disposition Plan  :  Remain hospitalized  Barriers to discharge: Hypoxia requiring O2 supplementation/complete 5 days of IV Remdesivir, 03/07/19  Consults  :  None  Procedures  :   None  Antibiotics  :    Anti-infectives (From admission, onward)   Start     Dose/Rate Route Frequency Ordered Stop   03/03/19 1600  remdesivir 100 mg in sodium chloride 0.9 % 250 mL IVPB     100 mg 500 mL/hr over 30 Minutes Intravenous Every 24 hours 03/02/19 2024 03/06/19 1659   03/03/19 1600  metroNIDAZOLE (FLAGYL) tablet 500 mg     500 mg Oral 3 times daily 03/03/19 1248     03/03/19 1300  ciprofloxacin (CIPRO) tablet 500 mg  Status:  Discontinued     500 mg Oral 2 times daily 03/03/19 1248 03/03/19 1249   03/03/19 0200  vancomycin (VANCOCIN) IVPB 1000 mg/200 mL premix  Status:  Discontinued     1,000 mg 200 mL/hr over 60 Minutes Intravenous Every 12 hours 03/02/19 1424 03/02/19 1701   03/02/19 2200  ceFEPIme (MAXIPIME) 2 g in sodium chloride 0.9 % 100 mL IVPB  Status:  Discontinued     2 g 200 mL/hr over 30 Minutes Intravenous Every 8 hours 03/02/19 1310 03/02/19 1701   03/02/19 2200  ciprofloxacin (CIPRO) tablet 500 mg     500 mg Oral 2 times daily 03/02/19 2031     03/02/19 2130  remdesivir 200 mg in sodium chloride 0.9 % 250 mL IVPB     200 mg 500 mL/hr over 30 Minutes Intravenous Once 03/02/19 2024 03/04/19 0120   03/02/19 1430  vancomycin (VANCOCIN) IVPB 1000 mg/200 mL premix     1,000 mg 200 mL/hr over 60 Minutes Intravenous  Once 03/02/19 1420 03/02/19 1659   03/02/19 1215  vancomycin (VANCOCIN) IVPB 1000 mg/200 mL premix     1,000 mg 200 mL/hr over 60 Minutes Intravenous Every 1 hr x 2 03/02/19 1146 03/02/19 1416   03/02/19 1145  ceFEPIme (MAXIPIME) 2 g in sodium chloride 0.9 % 100 mL IVPB  2 g 200 mL/hr over 30 Minutes Intravenous  Once 03/02/19 1136 03/02/19 1315   03/02/19 1145  vancomycin (VANCOCIN) IVPB 1000 mg/200 mL premix  Status:  Discontinued     1,000 mg 200 mL/hr over 60 Minutes Intravenous  Once 03/02/19 1136 03/02/19 1146     DVT Prophylaxis  : Eliquis >> Lovenox  Inpatient Medications  Scheduled Meds:  allopurinol  300 mg Oral q morning - 10a    ciprofloxacin  500 mg Oral BID   dexamethasone (DECADRON) injection  6 mg Intravenous Q24H   diltiazem  90 mg Oral Q8H   enoxaparin (LOVENOX) injection  115 mg Subcutaneous Q12H   levothyroxine  137 mcg Oral QAC breakfast   metroNIDAZOLE  500 mg Oral TID   multivitamin with minerals  1 tablet Oral Daily   vitamin C  500 mg Oral Daily   zinc sulfate  220 mg Oral Daily   Continuous Infusions:  PRN Meds:.acetaminophen **OR** [DISCONTINUED] acetaminophen, albuterol, chlorpheniramine-HYDROcodone, diltiazem, guaiFENesin-dextromethorphan, [DISCONTINUED] ondansetron **OR** ondansetron (ZOFRAN) IV, oxyCODONE, polyethylene glycol, traZODone   Time Spent in minutes  25  See all Orders from today for further details   Lala Lund M.D on 03/07/2019 at 10:48 AM  To page go to [email or web address] - use universal password  [facility] -  Office  [phone]    Objective:   Vitals:   03/06/19 2000 03/07/19 0000 03/07/19 0400 03/07/19 0720  BP: 116/72 109/67 110/64 (!) 97/34  Pulse: (!) 58 69 93 (!) 51  Resp: (!) 32 (!) 22 (!) 30 16  Temp: (!) 97 F (36.1 C) (!) 96.3 F (35.7 C) (!) 97.5 F (36.4 C) 97.8 F (36.6 C)  TempSrc: Axillary Axillary Oral Oral  SpO2: (!) 83% 92% 93% 98%  Weight:      Height:        Wt Readings from Last 3 Encounters:  03/02/19 113.4 kg  02/06/19 113.4 kg  09/03/17 117 kg     Intake/Output Summary (Last 24 hours) at 03/07/2019 1048 Last data filed at 03/07/2019 0906 Gross per 24 hour  Intake 1240 ml  Output 150 ml  Net 1090 ml     Physical Exam  Awake Alert, Oriented X 3, No new F.N deficits, Normal affect .AT,PERRAL Supple Neck,No JVD, No cervical lymphadenopathy appriciated.  Symmetrical Chest wall movement, Good air movement bilaterally, few rales RRR,No Gallops, Rubs or new Murmurs, No Parasternal Heave +ve B.Sounds, Abd Soft, No tenderness, No organomegaly appriciated, No rebound - guarding or rigidity. No Cyanosis,  Clubbing or edema,  Right upper quadrant JP drain in place,   Data Review:    CBC Recent Labs  Lab 03/03/19 0155 03/04/19 0601 03/05/19 0620 03/06/19 0009 03/07/19 0430  WBC 6.0 9.6 10.4 9.5 12.6*  HGB 13.1 14.4 14.6 14.0 13.6  HCT 40.2 43.1 45.0 43.7 41.6  PLT 102* 118* 96* 108* 115*  MCV 98.0 98.4 98.3 99.3 98.8  MCH 32.0 32.9 31.9 31.8 32.3  MCHC 32.6 33.4 32.4 32.0 32.7  RDW 14.3 14.4 14.6 14.6 14.7  LYMPHSABS 0.4* 0.5* 0.6* 0.7 0.7  MONOABS 0.4 0.7 0.7 0.7 0.7  EOSABS 0.0 0.0 0.0 0.0 0.0  BASOSABS 0.0 0.0 0.0 0.0 0.0    Chemistries  Recent Labs  Lab 03/03/19 0155 03/04/19 0601 03/05/19 0620 03/06/19 0009 03/07/19 0430  NA 133* 141 141 140 139  K 4.4 4.2 4.0 4.5 4.4  CL 100 102 104 102 104  CO2 21* 27 25 26 27   GLUCOSE 127* 125*  126* 116* 110*  BUN 22 29* 34* 31* 29*  CREATININE 0.99 0.86 0.94 0.93 0.80  CALCIUM 6.9* 8.2* 8.1* 8.3* 8.0*  MG 2.2 2.6* 2.4 2.4 2.3  AST 26 33 29 25 23   ALT 11 19 24 22 22   ALKPHOS 38 55 66 62 55  BILITOT 0.4 0.8 0.7 0.6 0.6   ------------------------------------------------------------------------------------------------------------------ No results for input(s): CHOL, HDL, LDLCALC, TRIG, CHOLHDL, LDLDIRECT in the last 72 hours.  Lab Results  Component Value Date   HGBA1C 5.8 (H) 08/08/2014   ------------------------------------------------------------------------------------------------------------------ No results for input(s): TSH, T4TOTAL, T3FREE, THYROIDAB in the last 72 hours.  Invalid input(s): FREET3 ------------------------------------------------------------------------------------------------------------------ No results for input(s): VITAMINB12, FOLATE, FERRITIN, TIBC, IRON, RETICCTPCT in the last 72 hours.  Coagulation profile No results for input(s): INR, PROTIME in the last 168 hours.  Recent Labs    03/06/19 0009 03/07/19 0430  DDIMER >20.00* 13.97*    Cardiac Enzymes No results for input(s):  CKMB, TROPONINI, MYOGLOBIN in the last 168 hours.  Invalid input(s): CK ------------------------------------------------------------------------------------------------------------------    Component Value Date/Time   BNP 29.5 03/07/2019 0430    Micro Results Recent Results (from the past 240 hour(s))  SARS Coronavirus 2 by RT PCR (hospital order, performed in [facility] hospital lab) Nasopharyngeal Nasopharyngeal Swab     Status: Abnormal   Collection Time: 03/02/19 11:08 AM   Specimen: Nasopharyngeal Swab  Result Value Ref Range Status   SARS Coronavirus 2 POSITIVE (A) NEGATIVE Final    Comment: RESULT CALLED TO, READ BACK BY AND VERIFIED WITH: R HOLCOLMB AT 1410 BY HFLYNT 03/02/19 (NOTE) If result is NEGATIVE SARS-CoV-2 target nucleic acids are NOT DETECTED. The SARS-CoV-2 RNA is generally detectable in upper and lower  respiratory specimens during the acute phase of infection. The lowest  concentration of SARS-CoV-2 viral copies this assay can detect is 250  copies / mL. A negative result does not preclude SARS-CoV-2 infection  and should not be used as the sole basis for treatment or other  patient management decisions.  A negative result may occur with  improper specimen collection / handling, submission of specimen other  than nasopharyngeal swab, presence of viral mutation(s) within the  areas targeted by this assay, and inadequate number of viral copies  (<250 copies / mL). A negative result must be combined with clinical  observations, patient history, and epidemiological information. If result is POSITIVE SARS-CoV-2 target nucleic acids are DETECTED.  The SARS-CoV-2 RNA is generally detectable in upper and lower  respiratory specimens during the acute phase of infection.  Positive  results are indicative of active infection with SARS-CoV-2.  Clinical  correlation with patient history and other diagnostic information is  necessary to determine patient infection  status.  Positive results do  not rule out bacterial infection or co-infection with other viruses. If result is PRESUMPTIVE POSTIVE SARS-CoV-2 nucleic acids MAY BE PRESENT.   A presumptive positive result was obtained on the submitted specimen  and confirmed on repeat testing.  While 2019 novel coronavirus  (SARS-CoV-2) nucleic acids may be present in the submitted sample  additional confirmatory testing may be necessary for epidemiological  and / or clinical management purposes  to differentiate between  SARS-CoV-2 and other Sarbecovirus currently known to infect humans.  If clinically indicated additional testing with an alternate test  methodology [phone])  is advised. The SARS-CoV-2 RNA is generally  detectable in upper and lower respiratory specimens during the acute  phase of infection. The expected result is Negative. Fact Sheet  for Patients:  StrictlyIdeas.no Fact Sheet for Healthcare Providers: BankingDealers.co.za This test is not yet approved or cleared by the [place] FDA and has been authorized for detection and/or diagnosis of SARS-CoV-2 by FDA under an Emergency Use Authorization (EUA).  This EUA will remain in effect (meaning this test can be used) for the duration of the COVID-19 declaration under Section 564(b)(1) of the Act, 21 U.S.C. section 360bbb-3(b)(1), unless the authorization is terminated or revoked sooner. Performed at [facility], [address] [place], [place] [address]   Blood Culture (routine x 2)     Status: None   Collection Time: 03/02/19 11:37 AM   Specimen: BLOOD LEFT ARM  Result Value Ref Range Status   Specimen Description BLOOD LEFT ARM  Final   Special Requests   Final    BOTTLES DRAWN AEROBIC AND ANAEROBIC Blood Culture adequate volume   Culture   Final    NO GROWTH 5 DAYS Performed at [facility], [address] [place], [place] [address]    Report Status 03/07/2019 FINAL  Final    Blood Culture (routine x 2)     Status: None   Collection Time: 03/02/19 11:37 AM   Specimen: BLOOD LEFT ARM  Result Value Ref Range Status   Specimen Description BLOOD LEFT ARM  Final   Special Requests   Final    BOTTLES DRAWN AEROBIC AND ANAEROBIC Blood Culture adequate volume   Culture   Final    NO GROWTH 5 DAYS Performed at [facility], [address], [place], [place] [address]    Report Status 03/07/2019 FINAL  Final    Radiology Reports Dg Chest 2 View  Result Date: 02/06/2019 CLINICAL DATA:  Onset fever and right flank pain 02/03/2019. EXAM: CHEST - 2 VIEW COMPARISON:  PA and lateral chest 04/03/2017. FINDINGS: Lungs clear. Heart size normal. No pneumothorax or pleural fluid. Atherosclerosis noted. No acute or focal bony abnormality. IMPRESSION: No acute disease. Atherosclerosis. Electronically Signed   By: Inge Rise M.D.   On: 02/06/2019 15:56   Dg Pelvis Portable  Result Date: 03/02/2019 CLINICAL DATA:  Right hip pain.  No injury. EXAM: PORTABLE PELVIS 1-2 VIEWS COMPARISON:  CT abdomen pelvis dated February 06, 2019. FINDINGS: No acute fracture or dislocation. Prior left proximal femur ORIF. Unchanged mild bilateral hip joint space narrowing with small marginal osteophytes. The pubic symphysis and sacroiliac joints are unremarkable. Lower lumbar spondylosis. Bone mineralization is normal. Soft tissues are unremarkable. IMPRESSION: 1.  No acute osseous abnormality. 2. Unchanged mild bilateral hip degenerative changes. Electronically Signed   By: Titus Dubin M.D.   On: 03/02/2019 11:27   Dg Chest Port 1 View  Result Date: 03/04/2019 CLINICAL DATA:  COVID-19 pneumonia. EXAM: PORTABLE CHEST 1 VIEW COMPARISON:  03/03/2019 FINDINGS: Lordotic technique demonstrated. Lungs are adequately inflated with slight overall worsening bilateral patchy airspace process left lung worse than right. No definite effusion. Cardiomediastinal silhouette and remainder of the exam is  unchanged. IMPRESSION: Slight interval worsening of diffuse bilateral patchy airspace process compatible with patient's known COVID-19 pneumonia. Electronically Signed   By: Marin Olp M.D.   On: 03/04/2019 09:00   Dg Chest Port 1 View  Result Date: 03/03/2019 CLINICAL DATA:  Shortness of breath. EXAM: PORTABLE CHEST 1 VIEW COMPARISON:  03/02/2019 FINDINGS: Low lung volumes with interstitial and airspace opacities unchanged compared to prior study. Worse in the peripheral left chest. Cardiomediastinal contours are stable. No signs of acute bone finding with evidence of prior cervicothoracic spinal  fusion, partially imaged. IMPRESSION: Findings remain concerning for viral or atypical pneumonia. Asymmetric edema could have a similar appearance. Electronically Signed   By: Zetta Bills M.D.   On: 03/03/2019 11:43   Dg Chest Port 1 View  Result Date: 03/02/2019 CLINICAL DATA:  Severe shortness of breath. EXAM: PORTABLE CHEST 1 VIEW COMPARISON:  Chest x-ray dated February 06, 2019. FINDINGS: The heart size and mediastinal contours are within normal limits. Normal pulmonary vascularity. Patchy asymmetric peripheral opacities in the left greater than right mid to lower lungs. No pleural effusion or pneumothorax. Unchanged elevation of the right hemidiaphragm. No acute osseous abnormality. IMPRESSION: 1. There are findings in the lungs which are nonspecific, but concerning for atypical infection, including potential viral pneumonia. Electronically Signed   By: Titus Dubin M.D.   On: 03/02/2019 11:25   Ct Renal Stone Study  Result Date: 02/06/2019 CLINICAL DATA:  Flank pain. Fever. Right flank pain since Thursday. EXAM: CT ABDOMEN AND PELVIS WITHOUT CONTRAST TECHNIQUE: Multidetector CT imaging of the abdomen and pelvis was performed following the standard protocol without IV contrast. COMPARISON:  12/30/2012 FINDINGS: Lower chest: Right hemidiaphragm elevation. Normal heart size without pericardial or  pleural effusion. Multivessel coronary artery atherosclerosis. Hepatobiliary: Moderate hepatic steatosis. Right hepatic lobe cysts, maximally 4.5 cm. Pneumobilia. Cholecystectomy, without biliary ductal dilatation. Pancreas: Normal, without mass or ductal dilatation. Spleen: Normal in size, without focal abnormality. Adrenals/Urinary Tract: Normal adrenal glands. Mild renal cortical thinning bilaterally. 2.2 cm upper/interpolar left renal cyst. Left renal vascular calcification. No renal calculi or hydronephrosis. No hydroureter or ureteric calculi. No bladder calculi. Stomach/Bowel: Normal stomach, without wall thickening. Scattered colonic diverticula. Normal terminal ileum and appendix. Normal small bowel. Vascular/Lymphatic: Aortic and branch vessel atherosclerosis. Nonaneurysmal dilatation of the infrarenal aorta at 3.0 cm. No abdominopelvic adenopathy. Reproductive: Mild prostatomegaly. Other: No significant free fluid. Musculoskeletal: Proximal left femoral fixation. Lumbosacral spondylosis. 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 IMPRESSION: 1.  No urinary tract calculi or hydronephrosis. 2. Hepatic steatosis. 3.  Aortic Atherosclerosis (ICD10-I70.0). 4. Prostatomegaly. Electronically Signed   By: Abigail Miyamoto M.D.   On: 02/06/2019 15:59   Vas [place] Lower Extremity Venous (dvt)  Result Date: 03/05/2019  Lower Venous Study Other Indications: Covid Positive, elevated d-diimer. Comparison Study: No priors Performing Technologist: Velva Harman Sturdivant RDMS, RVT  Examination Guidelines: A complete evaluation includes B-mode imaging, spectral Doppler, color Doppler, and power Doppler as needed of all accessible portions of each vessel. Bilateral testing is considered an integral part of a complete examination. Limited examinations for reoccurring indications may be performed as noted.  +---------+---------------+---------+-----------+----------+--------------+  RIGHT      Compressibility Phasicity Spontaneity Properties Thrombus Aging  +---------+---------------+---------+-----------+----------+--------------+  CFV       Full            Yes       Yes                                    +---------+---------------+---------+-----------+----------+--------------+  SFJ       Full                                                             +---------+---------------+---------+-----------+----------+--------------+  FV Prox   Full                                                             +---------+---------------+---------+-----------+----------+--------------+  FV Mid    Full                                                             +---------+---------------+---------+-----------+----------+--------------+  FV Distal Full                                                             +---------+---------------+---------+-----------+----------+--------------+  PFV       Full                                                             +---------+---------------+---------+-----------+----------+--------------+  POP       Full            Yes       Yes                                    +---------+---------------+---------+-----------+----------+--------------+  PTV       Full                                                             +---------+---------------+---------+-----------+----------+--------------+  PERO      Full                                                             +---------+---------------+---------+-----------+----------+--------------+   +---------+---------------+---------+-----------+----------+--------------+  LEFT      Compressibility Phasicity Spontaneity Properties Thrombus Aging  +---------+---------------+---------+-----------+----------+--------------+  CFV       Full            Yes       Yes                                    +---------+---------------+---------+-----------+----------+--------------+  SFJ       Full                                                              +---------+---------------+---------+-----------+----------+--------------+  FV Prox   Full                                                             +---------+---------------+---------+-----------+----------+--------------+  FV Mid    Full                                                             +---------+---------------+---------+-----------+----------+--------------+  FV Distal Full                                                             +---------+---------------+---------+-----------+----------+--------------+  PFV       Full                                                             +---------+---------------+---------+-----------+----------+--------------+  POP       Full            Yes       Yes                                    +---------+---------------+---------+-----------+----------+--------------+  PTV       Full                                                             +---------+---------------+---------+-----------+----------+--------------+  PERO      Full                                                             +---------+---------------+---------+-----------+----------+--------------+     Summary: Right: There is no evidence of deep vein thrombosis in the lower extremity. No cystic structure found in the popliteal fossa. Left: There is no evidence of deep vein thrombosis in the lower extremity. No cystic structure found in the popliteal fossa.  *See table(s) above for measurements and observations. Electronically signed by Curt Jews MD on 03/05/2019 at 7:06:10 AM.    Final

## 2019-03-07 NOTE — TOC Progression Note (Signed)
Transition of Care [facility]) - Progression Note    Patient Details  Name: Wayne Ortiz MRN: [id number] Date of Birth: [birth year]  Transition of Care [facility]) CM/SW Contact  Loletha Grayer Beverely Pace, RN Phone Number: [phone] (working remotely) 03/07/2019, 10:42 AM  Clinical Narrative:  Case manager spoke with patient concerning discharge needs. Therapy referred patient for SNF placement but he is not interested in SNF. Patient says he lives with his son and daughter-in-law and that he wants therapy at home. He realizes his recovery is slow process. Home Health agency choice offered, patient says CM can arrange. Should he need Oxygen at home will use [facility]. Case manager has updated MD via secure chat. Will continue to monitor.         Expected Discharge Plan and Services                                                 Social Determinants of Health (SDOH) Interventions    Readmission Risk Interventions No flowsheet data found.

## 2019-03-07 NOTE — TOC Progression Note (Signed)
Transition of Care [facility]) - Progression Note    Patient Details  Name: Wayne Ortiz MRN: [id number] Date of Birth: 1946/01/21  Transition of Care [facility]) CM/SW Contact  Loletha Grayer Beverely Pace, RN Phone Number: 03/07/2019, 12:46 PM  Clinical Narrative:   Case manager has attempted to arrange [place] services for patient, unfortunately there are no agencies in his area [place] that are accepting COVID patient's. Case manager will speak with patient again and will notify his son of same. Will offer SNF option again.     Expected Discharge Plan: [place] Barriers to Discharge: Continued Medical Work up  Expected Discharge Plan and Services Expected Discharge Plan: [place]   Discharge Planning Services: CM Consult Post Acute Care Choice: Durable Medical Equipment, Home Health Living arrangements for the past 2 months: Single Family Home                 DME Arranged: 3-N-1 DME Agency: [place] Date DME Agency Contacted: 03/07/19     HH Arranged: PT, OT [place] Agency: [place] Date [place] Agency Contacted: 03/07/19 Time [place]: [id number] Representative spoke with at [place]: [place] (SDOH) Interventions    Readmission Risk Interventions No flowsheet data found.

## 2019-03-07 NOTE — Progress Notes (Signed)
Spoke with pts son Gamble and given update. He states he would like to speak with MD. Dr Candiss Norse paged.

## 2019-03-07 NOTE — TOC Progression Note (Signed)
\  Transition of Care [facility]) - Progression Note    Patient Details  Name: Wayne Ortiz MRN: [id number] Date of Birth: Oct 13, 1945  Transition of Care [facility]) CM/SW Contact  Loletha Grayer Beverely Pace, RN Phone Number: 03/07/2019, 3:38 PM  Clinical Narrative:   Case manager spoke with patient concerning difficulty locating [place] in his area. He asked that I contact his son. CM contacted Wayne Ortiz [phone] and explained discharge needs and issue with [facility].He states that his last option for his dad is a SNF. He and his wife are willing to do what is needed for his dad at home. Shanon Brow asked that CM contact [facility] to see if they would provide services, hw states that they are 2 minutes from [place] line. [place] also asked that therapy provide them with exercises that they can do with patient at home. CM will send message to therapist concerning this. Case manager contacted [facility] in [place] since they accept COVID patients, but they will not cross the state line. [place] and was informed that they do not provide service to [place] patient's at this time. Case manager will update patient's son. Will continue to monitor.    Expected Discharge Plan: [place] Barriers to Discharge: Continued Medical Work up  Expected Discharge Plan and Services Expected Discharge Plan: [place]   Discharge Planning Services: CM Consult Post Acute Care Choice: Durable Medical Equipment, Home Health Living arrangements for the past 2 months: Single Family Home                 DME Arranged: 3-N-1 DME Agency: [place] Date DME Agency Contacted: 03/07/19     HH Arranged: PT, OT [place] Agency: [place] Date [place] Agency Contacted: 03/07/19 Time [place]: [id number] Representative spoke with at [place]: [place] (SDOH) Interventions     Readmission Risk Interventions No flowsheet data found.

## 2019-03-08 ENCOUNTER — Inpatient Hospital Stay (HOSPITAL_COMMUNITY): Payer: Medicare HMO

## 2019-03-08 LAB — COMPREHENSIVE METABOLIC PANEL
ALT: 25 U/L (ref 0–44)
AST: 27 U/L (ref 15–41)
Albumin: 2.8 g/dL — ABNORMAL LOW (ref 3.5–5.0)
Alkaline Phosphatase: 60 U/L (ref 38–126)
Anion gap: 13 (ref 5–15)
BUN: 31 mg/dL — ABNORMAL HIGH (ref 8–23)
CO2: 25 mmol/L (ref 22–32)
Calcium: 8.1 mg/dL — ABNORMAL LOW (ref 8.9–10.3)
Chloride: 101 mmol/L (ref 98–111)
Creatinine, Ser: 0.85 mg/dL (ref 0.61–1.24)
GFR calc Af Amer: >60 mL/min (ref >60)
GFR calc non Af Amer: >60 mL/min (ref >60)
Glucose, Bld: 127 mg/dL — ABNORMAL HIGH (ref 70–99)
Potassium: 4.5 mmol/L (ref 3.5–5.1)
Sodium: 139 mmol/L (ref 135–145)
Total Bilirubin: 0.5 mg/dL (ref 0.3–1.2)
Total Protein: 6.3 g/dL — ABNORMAL LOW (ref 6.5–8.1)

## 2019-03-08 LAB — CBC WITH DIFFERENTIAL/PLATELET
Abs Immature Granulocytes: 0.15 10*3/uL — ABNORMAL HIGH (ref 0.00–0.07)
Basophils Absolute: 0 10*3/uL (ref 0.0–0.1)
Basophils Relative: 0 %
Eosinophils Absolute: 0 10*3/uL (ref 0.0–0.5)
Eosinophils Relative: 0 %
HCT: 42.1 % (ref 39.0–52.0)
Hemoglobin: 13.7 g/dL (ref 13.0–17.0)
Immature Granulocytes: 2 %
Lymphocytes Relative: 5 %
Lymphs Abs: 0.5 10*3/uL — ABNORMAL LOW (ref 0.7–4.0)
MCH: 32.4 pg (ref 26.0–34.0)
MCHC: 32.5 g/dL (ref 30.0–36.0)
MCV: 99.5 fL (ref 80.0–100.0)
Monocytes Absolute: 0.4 10*3/uL (ref 0.1–1.0)
Monocytes Relative: 4 %
Neutro Abs: 8.3 10*3/uL — ABNORMAL HIGH (ref 1.7–7.7)
Neutrophils Relative %: 89 %
Platelets: 118 10*3/uL — ABNORMAL LOW (ref 150–400)
RBC: 4.23 MIL/uL (ref 4.22–5.81)
RDW: 14.9 % (ref 11.5–15.5)
WBC: 9.3 10*3/uL (ref 4.0–10.5)
nRBC: 0.2 % (ref 0.0–0.2)

## 2019-03-08 LAB — C-REACTIVE PROTEIN: CRP: 1 mg/dL — ABNORMAL HIGH (ref <1.0)

## 2019-03-08 LAB — D-DIMER, QUANTITATIVE: D-Dimer, Quant: 8.06 ug/mL-FEU — ABNORMAL HIGH (ref 0.00–0.50)

## 2019-03-08 LAB — BRAIN NATRIURETIC PEPTIDE: B Natriuretic Peptide: 26.2 pg/mL (ref 0.0–100.0)

## 2019-03-08 LAB — MAGNESIUM: Magnesium: 2.2 mg/dL (ref 1.7–2.4)

## 2019-03-08 MED ORDER — IOHEXOL 350 MG/ML SOLN
100.0000 mL | Freq: Once | INTRAVENOUS | Status: AC | PRN
  Administered 2019-03-08: 100 mL via INTRAVENOUS

## 2019-03-08 NOTE — Progress Notes (Signed)
Pt transported to CT  Today. Candiss Norse PA requested that I remove pt JP drain this afternoon. Harlow Asa assisted with this. Suture was not visible, slight resistance. Pt was pre-medicated & site is clean & dry- slightly pink. Vaseline gauze & dry gauze place for healing/protection. Pt used BSC several times today desats to mid-upper 80s with activity. 3-4L at rest. **Pt up in chair most of the day from 10am on.

## 2019-03-08 NOTE — Progress Notes (Signed)
ANTICOAGULATION CONSULT NOTE - Follow Up Consult  Pharmacy Consult for Lovenox Indication: atrial fibrillation, elevated D-dimer  Allergies  Allergen Reactions  . Atorvastatin Other (See Comments)    Arthralgias  . Lisinopril Other (See Comments)    CP and SOB  . Metoprolol Other (See Comments)    CP and SOb  . Rocephin [Ceftriaxone Sodium In Dextrose] Rash  . Rocephin [Ceftriaxone] Rash    Patient Measurements: Height: 6' (182.9 cm) Weight: 250 lb (113.4 kg) IBW/kg (Calculated) : 77.6  Vital Signs: Temp: 97.7 F (36.5 C) (11/10 0721) Temp Source: Oral (11/10 0721) BP: 108/73 (11/10 0721) Pulse Rate: 64 (11/10 0721)  Labs: Recent Labs    03/06/19 0009 03/07/19 0430 03/08/19 0003  HGB 14.0 13.6 13.7  HCT 43.7 41.6 42.1  PLT 108* 115* 118*  CREATININE 0.93 0.80 0.85    Estimated Creatinine Clearance: 99.1 mL/min (by C-G formula based on SCr of 0.85 mg/dL).   Medical History: Past Medical History:  Diagnosis Date  . Arthritis   . Atrial fibrillation ([place])   . BPH (benign prostatic hypertrophy)   . CAD (coronary artery disease)    a. s/p DES 05/2014 at [facility]  . Complication of anesthesia    BECAME COMBATIVE  . Dysrhythmia   . Femur fracture ([place])   . GERD (gastroesophageal reflux disease)    occasional TUMS  . Gout   . History of gout   . History of kidney stones   . Hypertension   . Hypothyroidism   . Persistent atrial fibrillation ([place])   . Precancerous skin lesion   . Renal disorder     Medications:  Scheduled:  . allopurinol  300 mg Oral q morning - 10a  . ciprofloxacin  500 mg Oral BID  . dexamethasone (DECADRON) injection  6 mg Intravenous Q24H  . diltiazem  90 mg Oral Q8H  . enoxaparin (LOVENOX) injection  115 mg Subcutaneous Q12H  . furosemide  40 mg Oral Daily  . levothyroxine  137 mcg Oral QAC breakfast  . metroNIDAZOLE  500 mg Oral TID  . multivitamin with minerals  1 tablet Oral Daily  . vitamin C  500 mg Oral Daily  . zinc sulfate   220 mg Oral Daily   Assessment: 64 yoM admitted on 11/4 with COVID-19 pneumonia.  He is on chronic apixaban anticoagulation prior to admission for afib.  Pharmacy is now consulted to change to treatment dose Lovenox for afib and elevated D-dimer.  Of note, D-dimer has trended down to 8.06 today. H/H wnl. Plt remains low but trending up slowly. SCr wnl   Goal of Therapy:  Anti-Xa level 0.6-1 units/ml 4hrs after LMWH dose given Monitor platelets by anticoagulation protocol: Yes   Plan:  Continue Lovenox 1 mg/kg (115 mg) [place] q12h. Follow up renal function, CBC, bleeding, D-dimer.  Albertina Parr, PharmD., BCPS Clinical Pharmacist Clinical phone for 03/08/19 until 5pm: [phone]

## 2019-03-08 NOTE — Progress Notes (Signed)
Request to IR for liver abscess drain evaluation placed at [place] on 02/13/19 - he was planned for follow up imaging at  [facility] however he is currently admitted at [facility] due to complications from Covid.   Patient reviewed by Dr. Vernard Gambles who requested CT abdomen with contrast which was obtained today and reviewed by Dr. Vernard Gambles who approves patient for drain removal today. Discussed drain removal with patient's RN via phone who will remove drain today at bedside. She will call IR at [facility] if there are questions or concerns.  Recommend dressing changes QD or PRN if soiled/wet until healed. Do not submerge until healed.  Candiss Norse, PA-C Pager# [phone], office# [phone] (please leave voicemail if no answer)

## 2019-03-08 NOTE — Progress Notes (Signed)
Physical Therapy Treatment Patient Details Name: Wayne Ortiz MRN: [id number] DOB: 12-16-1945 Today's Date: 03/08/2019    History of Present Illness 73 y/o male w/ hx of renal disorder, persistent a -fib, HTn, hypothyroidism, gout, kidney stones, GERD, femur fx, dyrhythmia, CAD, BPH, arthritis, R TKA 2015, cervical spine surgery, TEE w/o cardioversion, diagnosed as Covid positive at [facility] 10/26 at which time a chest x-ray noted opacities bilaterally, able to go home, re-evaluated on 11/01 w/ worsening SOB again able to go home, presented to ED 11/04 and was found to be hypoxic hence admitted to [facility].    PT Comments    Pt much more calmer today than previous, he states he is ready to go home and to continue with his rehabilitation. He states he has been on room air for some time now and sats are remaining in low 80s. Worked on [employer], issued written, illustrated exercises and worked on teach back on all. Pt declined to attempt ambulation stating he was fatigued and just wanted to rest.   Follow Up Recommendations  Home health PT     Equipment Recommendations  Rolling walker with 5" wheels;3in1 (PT)    Recommendations for Other Services       Precautions / Restrictions Precautions Precautions: Fall Restrictions Weight Bearing Restrictions: No    Mobility  Bed Mobility               General bed mobility comments: pt received sitting in recliner  Transfers Overall transfer level: Needs assistance Equipment used: 1 person hand held assist Transfers: Sit to/from Stand Sit to Stand: Min guard            Ambulation/Gait                 Stairs             Wheelchair Mobility    Modified Rankin (Stroke Patients Only)       Balance                                            Cognition Arousal/Alertness: Awake/alert Behavior During Therapy: WFL for tasks assessed/performed Overall Cognitive Status: Within Functional Limits for tasks  assessed                                 General Comments: did not seems as anxious as previous, states that he is ready to go home and will work on his exercises daily to rehab himself      Exercises General Exercises - Upper Extremity Shoulder Horizontal ABduction: 10 reps;Theraband;Strengthening Theraband Level (Shoulder Horizontal Abduction): Level 2 (Red) Elbow Flexion: 10 reps;Theraband;Strengthening Theraband Level (Elbow Flexion): Level 2 (Red) Elbow Extension: Strengthening;10 reps;Theraband Theraband Level (Elbow Extension): Level 2 (Red) General Exercises - Lower Extremity Long Arc Quad: 10 reps Hip Flexion/Marching: 10 reps Other Exercises Other Exercises: flutter valve x 10 Other Exercises: incentive spirometer x 10    General Comments        Pertinent Vitals/Pain Pain Assessment: No/denies pain    Home Living                      Prior Function            PT Goals (current goals can now be found in the care plan section) Acute  Rehab PT Goals Patient Stated Goal: wants to go home and work on his own rehabilitation, does not wish to go to SNF PT Goal Formulation: With patient Time For Goal Achievement: 03/18/19 Potential to Achieve Goals: Fair Progress towards PT goals: Progressing toward goals    Frequency    Min 2X/week      PT Plan Discharge plan needs to be updated    Co-evaluation              AM-PAC PT "6 Clicks" Mobility   Outcome Measure  Help needed turning from your back to your side while in a flat bed without using bedrails?: A Lot Help needed moving from lying on your back to sitting on the side of a flat bed without using bedrails?: A Lot Help needed moving to and from a bed to a chair (including a wheelchair)?: A Little Help needed standing up from a chair using your arms (e.g., wheelchair or bedside chair)?: A Little Help needed to walk in hospital room?: A Little Help needed climbing 3-5 steps with  a railing? : A Lot 6 Click Score: 15    End of Session Equipment Utilized During Treatment: Other (comment)(theraband) Activity Tolerance: Treatment limited secondary to medical complications (Comment) Patient left: in chair;with call bell/phone within reach   PT Visit Diagnosis: Other abnormalities of gait and mobility (R26.89);Muscle weakness (generalized) (M62.81)     Time: [id number] PT Time Calculation (min) (ACUTE ONLY): 19 min  Charges:  $Therapeutic Exercise: 8-22 mins                     Horald Chestnut, PT    Delford Field 03/08/2019, 3:46 PM

## 2019-03-08 NOTE — Progress Notes (Signed)
PROGRESS NOTE                                                                                                                                                                                                             Patient Demographics:    Wayne Ortiz, is a 73 y.o. male, DOB - 1945-07-27, SE:[id number]  Outpatient Primary MD for the patient is Celene Squibb, MD   Admit date - 03/02/2019   LOS - 6  Chief Complaint  Patient presents with   Shortness of Breath       Brief Narrative: Patient is a 73 y.o. 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  Significant Events: 10/26 Covid positive at [facility] 11/1 CTA chest [place] consistent with Covid pneumonitis 11/4 admit to [facility] via [facility]   Subjective:   Patient in bed, appears comfortable, denies any headache, no fever, no chest pain or pressure, no shortness of breath , no abdominal pain. No focal weakness.    Assessment  & Plan :   Acute Hypoxic Resp Failure due to Covid 19 Viral pneumonia: He was appropriately started on IV steroids and remdesivir combination with good effect, initially requiring 6 L nasal cannula oxygen now down to 3-4 L.  Clinically improving.  Inflammatory markers improving as well except for D-dimer.  SpO2: 97 % O2 Flow Rate (L/min): 6 L/min FiO2 (%): 100 %   COVID-19 Labs: Recent Labs    03/06/19 0009 03/07/19 0430 03/08/19 0003  DDIMER >20.00* 13.97* 8.06*  CRP 2.9*  1.5* 1.0*    Lab Results  Component Value Date   SARSCOV2NAA POSITIVE (A) 03/02/2019   SARSCOV2NAA NOT DETECTED 02/06/2019     COVID-19 Medications: Steroids: 11/4>> Remdesivir: 11/4>> Actemra: Not given due to ongoing hepatic abscess Convalescent Plasma: 11/5 x 1    Markedly elevated D-dimer: Likely due to intense underlying inflammation however DVT cannot be ruled out, lower extremity venous duplex is negative, he is  already on Eliquis we will transition him to Lovenox till D-dimer trends down.  Monitor clinically.  If develops worsening hypoxia will consider CTA chest.  Paroxysmal atrial fibrillation with acute RVR: Currently in mild RVR added Cardizem due to metoprolol allergy, anticoagulation as above, last echo in chart shows a preserved EF of 55 to 60%.  Hepatic abscess: Status post percutaneous drain placement 10/18 -treated with Rocephin and Flagyl -was to have repeat imaging at [facility] with consideration being given to removing his drain at that time -drain appears benign at present -continue Cipro/Flagyl.  Apparently his date of drain removal was 03/08/2019.  Will request IR to evaluate him for this purpose.  He clearly is unable to go to [facility] at this time.  Drain output has been minimal to none.  CAD - PCI to RCA and LCx February 2016, not on any maintenance medications.  Hypothyroidism: Synthroid  Debility/deconditioning: Secondary to acute illness-PT/OT ordered.  Feels SNF multiple times.  Will go with home PT.  Home PT and oxygen have been ordered.  Obesity: BMI of 33.  Follow with PCP for weight loss.   Acute on chronic diastolic CHF, baseline EF 60% on echocardiogram done April 2018.  Lasix dependent at home.  Lasix resumed on 03/07/2019.  Appears compensated now.    Condition - Extremely Guarded  Family Communication  : Son updated over the phone on 11/6, 11/7, 11/9  Code Status :  Full Code  Diet :  Diet Order            Diet regular Room service  appropriate? Yes; Fluid consistency: Thin  Diet effective now               Disposition Plan  : Home health PT, patient clearly refused SNF multiple times.  Barriers to discharge: Finished remdesivir course and clinical improvement  Consults  : IR for liver drain management  Procedures  :  None  Antibiotics  :    Anti-infectives (From admission, onward)   Start     Dose/Rate Route Frequency Ordered Stop   03/03/19 1600  remdesivir 100 mg in sodium chloride 0.9 % 250 mL IVPB     100 mg 500 mL/hr over 30 Minutes Intravenous Every 24 hours 03/02/19 2024 03/06/19 1659   03/03/19 1600  metroNIDAZOLE (FLAGYL) tablet 500 mg     500 mg Oral 3 times daily 03/03/19 1248     03/03/19 1300  ciprofloxacin (CIPRO) tablet 500 mg  Status:  Discontinued     500 mg Oral 2 times daily 03/03/19 1248 03/03/19 1249   03/03/19 0200  vancomycin (VANCOCIN) IVPB 1000 mg/200 mL premix  Status:  Discontinued     1,000 mg 200 mL/hr over 60 Minutes Intravenous Every 12 hours 03/02/19 1424 03/02/19 1701   03/02/19 2200  ceFEPIme (MAXIPIME) 2 g in sodium chloride 0.9 % 100 mL IVPB  Status:  Discontinued     2 g 200 mL/hr over 30 Minutes Intravenous Every 8 hours 03/02/19 1310 03/02/19 1701   03/02/19 2200  ciprofloxacin (CIPRO) tablet 500 mg     500 mg Oral 2 times daily 03/02/19 2031     03/02/19 2130  remdesivir 200 mg in sodium chloride 0.9 % 250 mL IVPB     200 mg 500 mL/hr over 30 Minutes Intravenous Once 03/02/19 2024 03/04/19 0120   03/02/19 1430  vancomycin (VANCOCIN) IVPB 1000 mg/200 mL premix     1,000 mg 200 mL/hr over 60 Minutes Intravenous  Once 03/02/19  1420 03/02/19 1659   03/02/19 1215  vancomycin (VANCOCIN) IVPB 1000 mg/200 mL premix     1,000 mg 200 mL/hr over 60 Minutes Intravenous Every 1 hr x 2 03/02/19 1146 03/02/19 1416   03/02/19 1145  ceFEPIme (MAXIPIME) 2 g in sodium chloride 0.9 % 100 mL IVPB     2 g 200 mL/hr over 30 Minutes Intravenous  Once 03/02/19 1136 03/02/19 1315    03/02/19 1145  vancomycin (VANCOCIN) IVPB 1000 mg/200 mL premix  Status:  Discontinued     1,000 mg 200 mL/hr over 60 Minutes Intravenous  Once 03/02/19 1136 03/02/19 1146     DVT Prophylaxis  : Eliquis >> Lovenox  Inpatient Medications  Scheduled Meds:  allopurinol  300 mg Oral q morning - 10a   ciprofloxacin  500 mg Oral BID   dexamethasone (DECADRON) injection  6 mg Intravenous Q24H   diltiazem  90 mg Oral Q8H   enoxaparin (LOVENOX) injection  115 mg Subcutaneous Q12H   furosemide  40 mg Oral Daily   levothyroxine  137 mcg Oral QAC breakfast   metroNIDAZOLE  500 mg Oral TID   multivitamin with minerals  1 tablet Oral Daily   vitamin C  500 mg Oral Daily   zinc sulfate  220 mg Oral Daily   Continuous Infusions:  PRN Meds:.acetaminophen **OR** [DISCONTINUED] acetaminophen, albuterol, ALPRAZolam, chlorpheniramine-HYDROcodone, diltiazem, guaiFENesin-dextromethorphan, [DISCONTINUED] ondansetron **OR** ondansetron (ZOFRAN) IV, oxyCODONE, polyethylene glycol, traZODone   Time Spent in minutes  25  See all Orders from today for further details   Lala Lund M.D on 03/08/2019 at 9:20 AM  To page go to [email or web address] - use universal password  [facility] -  Office  [phone]    Objective:   Vitals:   03/07/19 1900 03/08/19 0300 03/08/19 0629 03/08/19 0721  BP: 105/71 111/80 111/80 108/73  Pulse: 73 (!) 55  64  Resp: 20 20  (!) 22  Temp: 98.4 F (36.9 C) 98.2 F (36.8 C)  97.7 F (36.5 C)  TempSrc: Oral Oral  Oral  SpO2: 97% 97%  97%  Weight:      Height:        Wt Readings from Last 3 Encounters:  03/02/19 113.4 kg  02/06/19 113.4 kg  09/03/17 117 kg     Intake/Output Summary (Last 24 hours) at 03/08/2019 0920 Last data filed at 03/07/2019 1900 Gross per 24 hour  Intake 600 ml  Output 406 ml  Net 194 ml     Physical Exam  Awake Alert, Oriented X 3, No new F.N deficits, Normal affect [place].AT,PERRAL Supple Neck,No JVD, No cervical  lymphadenopathy appriciated.  Symmetrical Chest wall movement, Good air movement bilaterally, CTAB RRR,No Gallops, Rubs or new Murmurs, No Parasternal Heave +ve B.Sounds, Abd Soft, No tenderness, No organomegaly appriciated, No rebound - guarding or rigidity. No Cyanosis, Clubbing or edema, Right upper quadrant JP drain in place,   Data Review:    CBC Recent Labs  Lab 03/04/19 0601 03/05/19 0620 03/06/19 0009 03/07/19 0430 03/08/19 0003  WBC 9.6 10.4 9.5 12.6* 9.3  HGB 14.4 14.6 14.0 13.6 13.7  HCT 43.1 45.0 43.7 41.6 42.1  PLT 118* 96* 108* 115* 118*  MCV 98.4 98.3 99.3 98.8 99.5  MCH 32.9 31.9 31.8 32.3 32.4  MCHC 33.4 32.4 32.0 32.7 32.5  RDW 14.4 14.6 14.6 14.7 14.9  LYMPHSABS 0.5* 0.6* 0.7 0.7 0.5*  MONOABS 0.7 0.7 0.7 0.7 0.4  EOSABS 0.0 0.0 0.0 0.0 0.0  BASOSABS 0.0 0.0 0.0  0.0 0.0    Chemistries  Recent Labs  Lab 03/04/19 0601 03/05/19 0620 03/06/19 0009 03/07/19 0430 03/08/19 0003  NA 141 141 140 139 139  K 4.2 4.0 4.5 4.4 4.5  CL 102 104 102 104 101  CO2 27 25 26 27 25   GLUCOSE 125* 126* 116* 110* 127*  BUN 29* 34* 31* 29* 31*  CREATININE 0.86 0.94 0.93 0.80 0.85  CALCIUM 8.2* 8.1* 8.3* 8.0* 8.1*  MG 2.6* 2.4 2.4 2.3 2.2  AST 33 29 25 23 27   ALT 19 24 22 22 25   ALKPHOS 55 66 62 55 60  BILITOT 0.8 0.7 0.6 0.6 0.5   ------------------------------------------------------------------------------------------------------------------ No results for input(s): CHOL, HDL, LDLCALC, TRIG, CHOLHDL, LDLDIRECT in the last 72 hours.  Lab Results  Component Value Date   HGBA1C 5.8 (H) 08/08/2014   ------------------------------------------------------------------------------------------------------------------ No results for input(s): TSH, T4TOTAL, T3FREE, THYROIDAB in the last 72 hours.  Invalid input(s): FREET3 ------------------------------------------------------------------------------------------------------------------ No results for input(s):  VITAMINB12, FOLATE, FERRITIN, TIBC, IRON, RETICCTPCT in the last 72 hours.  Coagulation profile No results for input(s): INR, PROTIME in the last 168 hours.  Recent Labs    03/07/19 0430 03/08/19 0003  DDIMER 13.97* 8.06*    Cardiac Enzymes No results for input(s): CKMB, TROPONINI, MYOGLOBIN in the last 168 hours.  Invalid input(s): CK ------------------------------------------------------------------------------------------------------------------    Component Value Date/Time   BNP 26.2 03/08/2019 0003    Micro Results Recent Results (from the past 240 hour(s))  SARS Coronavirus 2 by RT PCR (hospital order, performed in [facility] hospital lab) Nasopharyngeal Nasopharyngeal Swab     Status: Abnormal   Collection Time: 03/02/19 11:08 AM   Specimen: Nasopharyngeal Swab  Result Value Ref Range Status   SARS Coronavirus 2 POSITIVE (A) NEGATIVE Final    Comment: RESULT CALLED TO, READ BACK BY AND VERIFIED WITH: R HOLCOLMB AT 1410 BY HFLYNT 03/02/19 (NOTE) If result is NEGATIVE SARS-CoV-2 target nucleic acids are NOT DETECTED. The SARS-CoV-2 RNA is generally detectable in upper and lower  respiratory specimens during the acute phase of infection. The lowest  concentration of SARS-CoV-2 viral copies this assay can detect is 250  copies / mL. A negative result does not preclude SARS-CoV-2 infection  and should not be used as the sole basis for treatment or other  patient management decisions.  A negative result may occur with  improper specimen collection / handling, submission of specimen other  than nasopharyngeal swab, presence of viral mutation(s) within the  areas targeted by this assay, and inadequate number of viral copies  (<250 copies / mL). A negative result must be combined with clinical  observations, patient history, and epidemiological information. If result is POSITIVE SARS-CoV-2 target nucleic acids are DETECTED.  The SARS-CoV-2 RNA is generally detectable in  upper and lower  respiratory specimens during the acute phase of infection.  Positive  results are indicative of active infection with SARS-CoV-2.  Clinical  correlation with patient history and other diagnostic information is  necessary to determine patient infection status.  Positive results do  not rule out bacterial infection or co-infection with other viruses. If result is PRESUMPTIVE POSTIVE SARS-CoV-2 nucleic acids MAY BE PRESENT.   A presumptive positive result was obtained on the submitted specimen  and confirmed on repeat testing.  While 2019 novel coronavirus  (SARS-CoV-2) nucleic acids may be present in the submitted sample  additional confirmatory testing may be necessary for epidemiological  and / or clinical management purposes  to differentiate between  SARS-CoV-2 and other Sarbecovirus currently known to infect humans.  If clinically indicated additional testing with an alternate test  methodology [phone])  is advised. The SARS-CoV-2 RNA is generally  detectable in upper and lower respiratory specimens during the acute  phase of infection. The expected result is Negative. Fact Sheet for Patients:  StrictlyIdeas.no Fact Sheet for Healthcare Providers: BankingDealers.co.za This test is not yet approved or cleared by the [place] FDA and has been authorized for detection and/or diagnosis of SARS-CoV-2 by FDA under an Emergency Use Authorization (EUA).  This EUA will remain in effect (meaning this test can be used) for the duration of the COVID-19 declaration under Section 564(b)(1) of the Act, 21 U.S.C. section 360bbb-3(b)(1), unless the authorization is terminated or revoked sooner. Performed at [facility], [address] [place] , [place] [address]   Blood Culture (routine x 2)     Status: None   Collection Time: 03/02/19 11:37 AM   Specimen: BLOOD LEFT ARM  Result Value Ref Range Status   Specimen Description  BLOOD LEFT ARM  Final   Special Requests   Final    BOTTLES DRAWN AEROBIC AND ANAEROBIC Blood Culture adequate volume   Culture   Final    NO GROWTH 5 DAYS Performed at [facility], [address] [place], [place] [address]    Report Status 03/07/2019 FINAL  Final  Blood Culture (routine x 2)     Status: None   Collection Time: 03/02/19 11:37 AM   Specimen: BLOOD LEFT ARM  Result Value Ref Range Status   Specimen Description BLOOD LEFT ARM  Final   Special Requests   Final    BOTTLES DRAWN AEROBIC AND ANAEROBIC Blood Culture adequate volume   Culture   Final    NO GROWTH 5 DAYS Performed at [facility], [address], [place],  [address]    Report Status 03/07/2019 FINAL  Final    Radiology Reports Dg Chest 2 View  Result Date: 02/06/2019 CLINICAL DATA:  Onset fever and right flank pain 02/03/2019. EXAM: CHEST - 2 VIEW COMPARISON:  PA and lateral chest 04/03/2017. FINDINGS: Lungs clear. Heart size normal. No pneumothorax or pleural fluid. Atherosclerosis noted. No acute or focal bony abnormality. IMPRESSION: No acute disease. Atherosclerosis. Electronically Signed   By: Inge Rise M.D.   On: 02/06/2019 15:56   Dg Pelvis Portable  Result Date: 03/02/2019 CLINICAL DATA:  Right hip pain.  No injury. EXAM: PORTABLE PELVIS 1-2 VIEWS COMPARISON:  CT abdomen pelvis dated February 06, 2019. FINDINGS: No acute fracture or dislocation. Prior left proximal femur ORIF. Unchanged mild bilateral hip joint space narrowing with small marginal osteophytes. The pubic symphysis and sacroiliac joints are unremarkable. Lower lumbar spondylosis. Bone mineralization is normal. Soft tissues are unremarkable. IMPRESSION: 1.  No acute osseous abnormality. 2. Unchanged mild bilateral hip degenerative changes. Electronically Signed   By: Titus Dubin M.D.   On: 03/02/2019 11:27   Dg Chest Port 1 View  Result Date: 03/04/2019 CLINICAL DATA:  COVID-19 pneumonia. EXAM: PORTABLE CHEST 1 VIEW  COMPARISON:  03/03/2019 FINDINGS: Lordotic technique demonstrated. Lungs are adequately inflated with slight overall worsening bilateral patchy airspace process left lung worse than right. No definite effusion. Cardiomediastinal silhouette and remainder of the exam is unchanged. IMPRESSION: Slight interval worsening of diffuse bilateral patchy airspace process compatible with patient's known COVID-19 pneumonia. Electronically Signed   By: Marin Olp M.D.   On: 03/04/2019 09:00   Dg Chest Port 1 View  Result Date:  03/03/2019 CLINICAL DATA:  Shortness of breath. EXAM: PORTABLE CHEST 1 VIEW COMPARISON:  03/02/2019 FINDINGS: Low lung volumes with interstitial and airspace opacities unchanged compared to prior study. Worse in the peripheral left chest. Cardiomediastinal contours are stable. No signs of acute bone finding with evidence of prior cervicothoracic spinal fusion, partially imaged. IMPRESSION: Findings remain concerning for viral or atypical pneumonia. Asymmetric edema could have a similar appearance. Electronically Signed   By: Zetta Bills M.D.   On: 03/03/2019 11:43   Dg Chest Port 1 View  Result Date: 03/02/2019 CLINICAL DATA:  Severe shortness of breath. EXAM: PORTABLE CHEST 1 VIEW COMPARISON:  Chest x-ray dated February 06, 2019. FINDINGS: The heart size and mediastinal contours are within normal limits. Normal pulmonary vascularity. Patchy asymmetric peripheral opacities in the left greater than right mid to lower lungs. No pleural effusion or pneumothorax. Unchanged elevation of the right hemidiaphragm. No acute osseous abnormality. IMPRESSION: 1. There are findings in the lungs which are nonspecific, but concerning for atypical infection, including potential viral pneumonia. Electronically Signed   By: Titus Dubin M.D.   On: 03/02/2019 11:25   Ct Renal Stone Study  Result Date: 02/06/2019 CLINICAL DATA:  Flank pain. Fever. Right flank pain since Thursday. EXAM: CT ABDOMEN AND  PELVIS WITHOUT CONTRAST TECHNIQUE: Multidetector CT imaging of the abdomen and pelvis was performed following the standard protocol without IV contrast. COMPARISON:  12/30/2012 FINDINGS: Lower chest: Right hemidiaphragm elevation. Normal heart size without pericardial or pleural effusion. Multivessel coronary artery atherosclerosis. Hepatobiliary: Moderate hepatic steatosis. Right hepatic lobe cysts, maximally 4.5 cm. Pneumobilia. Cholecystectomy, without biliary ductal dilatation. Pancreas: Normal, without mass or ductal dilatation. Spleen: Normal in size, without focal abnormality. Adrenals/Urinary Tract: Normal adrenal glands. Mild renal cortical thinning bilaterally. 2.2 cm upper/interpolar left renal cyst. Left renal vascular calcification. No renal calculi or hydronephrosis. No hydroureter or ureteric calculi. No bladder calculi. Stomach/Bowel: Normal stomach, without wall thickening. Scattered colonic diverticula. Normal terminal ileum and appendix. Normal small bowel. Vascular/Lymphatic: Aortic and branch vessel atherosclerosis. Nonaneurysmal dilatation of the infrarenal aorta at 3.0 cm. No abdominopelvic adenopathy. Reproductive: Mild prostatomegaly. Other: No significant free fluid. Musculoskeletal: Proximal left femoral fixation. Lumbosacral spondylosis. 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 IMPRESSION: 1.  No urinary tract calculi or hydronephrosis. 2. Hepatic steatosis. 3.  Aortic Atherosclerosis (ICD10-I70.0). 4. Prostatomegaly. Electronically Signed   By: Abigail Miyamoto M.D.   On: 02/06/2019 15:59   Vas [place] Lower Extremity Venous (dvt)  Result Date: 03/05/2019  Lower Venous Study Other Indications: Covid Positive, elevated d-diimer. Comparison Study: No priors Performing Technologist: Velva Harman Sturdivant RDMS, RVT  Examination Guidelines: A complete evaluation includes B-mode imaging, spectral Doppler, color Doppler, and power Doppler as needed of all accessible portions of each vessel.  Bilateral testing is considered an integral part of a complete examination. Limited examinations for reoccurring indications may be performed as noted.  +---------+---------------+---------+-----------+----------+--------------+  RIGHT     Compressibility Phasicity Spontaneity Properties Thrombus Aging  +---------+---------------+---------+-----------+----------+--------------+  CFV       Full            Yes       Yes                                    +---------+---------------+---------+-----------+----------+--------------+  SFJ       Full                                                             +---------+---------------+---------+-----------+----------+--------------+  FV Prox   Full                                                             +---------+---------------+---------+-----------+----------+--------------+  FV Mid    Full                                                             +---------+---------------+---------+-----------+----------+--------------+  FV Distal Full                                                             +---------+---------------+---------+-----------+----------+--------------+  PFV       Full                                                             +---------+---------------+---------+-----------+----------+--------------+  POP       Full            Yes       Yes                                    +---------+---------------+---------+-----------+----------+--------------+  PTV       Full                                                             +---------+---------------+---------+-----------+----------+--------------+  PERO      Full                                                             +---------+---------------+---------+-----------+----------+--------------+   +---------+---------------+---------+-----------+----------+--------------+  LEFT      Compressibility Phasicity Spontaneity Properties Thrombus Aging   +---------+---------------+---------+-----------+----------+--------------+  CFV       Full            Yes       Yes                                    +---------+---------------+---------+-----------+----------+--------------+  SFJ       Full                                                             +---------+---------------+---------+-----------+----------+--------------+  FV Prox   Full                                                             +---------+---------------+---------+-----------+----------+--------------+  FV Mid    Full                                                             +---------+---------------+---------+-----------+----------+--------------+  FV Distal Full                                                             +---------+---------------+---------+-----------+----------+--------------+  PFV       Full                                                             +---------+---------------+---------+-----------+----------+--------------+  POP       Full            Yes       Yes                                    +---------+---------------+---------+-----------+----------+--------------+  PTV       Full                                                             +---------+---------------+---------+-----------+----------+--------------+  PERO      Full                                                             +---------+---------------+---------+-----------+----------+--------------+     Summary: Right: There is no evidence of deep vein thrombosis in the lower extremity. No cystic structure found in the popliteal fossa. Left: There is no evidence of deep vein thrombosis in the lower extremity. No cystic structure found in the popliteal fossa.  *See table(s) above for measurements and observations. Electronically signed by Curt Jews MD on 03/05/2019 at 7:06:10 AM.    Final

## 2019-03-09 LAB — COMPREHENSIVE METABOLIC PANEL
ALT: 33 U/L (ref 0–44)
AST: 28 U/L (ref 15–41)
Albumin: 3 g/dL — ABNORMAL LOW (ref 3.5–5.0)
Alkaline Phosphatase: 61 U/L (ref 38–126)
Anion gap: 11 (ref 5–15)
BUN: 31 mg/dL — ABNORMAL HIGH (ref 8–23)
CO2: 21 mmol/L — ABNORMAL LOW (ref 22–32)
Calcium: 8.2 mg/dL — ABNORMAL LOW (ref 8.9–10.3)
Chloride: 103 mmol/L (ref 98–111)
Creatinine, Ser: 0.86 mg/dL (ref 0.61–1.24)
GFR calc Af Amer: >60 mL/min (ref >60)
GFR calc non Af Amer: >60 mL/min (ref >60)
Glucose, Bld: 117 mg/dL — ABNORMAL HIGH (ref 70–99)
Potassium: 4.9 mmol/L (ref 3.5–5.1)
Sodium: 135 mmol/L (ref 135–145)
Total Bilirubin: 0.8 mg/dL (ref 0.3–1.2)
Total Protein: 6.7 g/dL (ref 6.5–8.1)

## 2019-03-09 LAB — C-REACTIVE PROTEIN: CRP: <0.8 mg/dL (ref <1.0)

## 2019-03-09 LAB — CBC WITH DIFFERENTIAL/PLATELET
Abs Immature Granulocytes: 0.24 10*3/uL — ABNORMAL HIGH (ref 0.00–0.07)
Basophils Absolute: 0 10*3/uL (ref 0.0–0.1)
Basophils Relative: 0 %
Eosinophils Absolute: 0 10*3/uL (ref 0.0–0.5)
Eosinophils Relative: 0 %
HCT: 45.5 % (ref 39.0–52.0)
Hemoglobin: 14.9 g/dL (ref 13.0–17.0)
Immature Granulocytes: 1 %
Lymphocytes Relative: 3 %
Lymphs Abs: 0.5 10*3/uL — ABNORMAL LOW (ref 0.7–4.0)
MCH: 32.1 pg (ref 26.0–34.0)
MCHC: 32.7 g/dL (ref 30.0–36.0)
MCV: 98.1 fL (ref 80.0–100.0)
Monocytes Absolute: 0.6 10*3/uL (ref 0.1–1.0)
Monocytes Relative: 4 %
Neutro Abs: 16.1 10*3/uL — ABNORMAL HIGH (ref 1.7–7.7)
Neutrophils Relative %: 92 %
Platelets: 123 10*3/uL — ABNORMAL LOW (ref 150–400)
RBC: 4.64 MIL/uL (ref 4.22–5.81)
RDW: 15.3 % (ref 11.5–15.5)
WBC: 17.5 10*3/uL — ABNORMAL HIGH (ref 4.0–10.5)
nRBC: 0 % (ref 0.0–0.2)

## 2019-03-09 LAB — MAGNESIUM: Magnesium: 2.2 mg/dL (ref 1.7–2.4)

## 2019-03-09 LAB — D-DIMER, QUANTITATIVE: D-Dimer, Quant: 6.47 ug/mL-FEU — ABNORMAL HIGH (ref 0.00–0.50)

## 2019-03-09 LAB — BRAIN NATRIURETIC PEPTIDE: B Natriuretic Peptide: 25.6 pg/mL (ref 0.0–100.0)

## 2019-03-09 NOTE — Progress Notes (Signed)
PROGRESS NOTE                                                                                                                                                                                                             Patient Demographics:    Wayne Ortiz, is a 73 y.o. male, DOB - 1945-06-18, SH:[id number]  Outpatient Primary MD for the patient is Celene Squibb, MD   Admit date - 03/02/2019   LOS - 7  Chief Complaint  Patient presents with   Shortness of Breath       Brief Narrative: Patient is a 73 y.o. male with PMHx of HFpEF (02/2018 EF 65%)and CAD (05/2014 RCA & LCX PCI), liver abscess s/p percutaneous JP drain placement (at [facility]) on 10/18 (Cipro through 03/14/19), PAF on anticoagulation-who was Covid positive at [facility] on 10/26 and 11/1-subsequently discharged home-who presented to [facility] emergency department with gradual worsening shortness of breath-found to have acute hypoxic respiratory failure secondary to COVID-19 pneumonia, subsequently transferred to triad hospitalist service at [facility].  Significant Events: 10/26 Covid positive at [facility] 11/1 CTA chest [place] consistent with Covid pneumonitis 11/4 admit to [facility] via [facility]   Subjective:   Patient in bed, appears comfortable, denies any headache, no fever, no chest pain or pressure, no shortness of breath , no abdominal pain. No focal weakness.   Assessment  & Plan :   Acute Hypoxic Resp Failure due to Covid 19 Viral pneumonia: He was appropriately started on IV steroids and remdesivir combination with good effect, initially requiring 6 L nasal cannula oxygen now down to 3-4 L.  Clinically improving.  Inflammatory markers improving as well except for D-dimer.  SpO2: 90 % O2 Flow Rate (L/min): 4 L/min FiO2 (%): 100 %   COVID-19 Labs: Recent Labs    03/07/19 0430 03/08/19 0003  DDIMER 13.97* 8.06*  CRP 1.5* 1.0*    Lab Results    Component Value Date   SARSCOV2NAA POSITIVE (A) 03/02/2019   SARSCOV2NAA NOT DETECTED 02/06/2019     COVID-19 Medications: Steroids: 11/4>> Remdesivir: 11/4>> Actemra: Not given due to ongoing hepatic abscess Convalescent Plasma: 11/5 x 1    Markedly elevated D-dimer: Likely due to intense underlying inflammation however DVT cannot be ruled out, lower extremity venous duplex is negative, he is already on Eliquis we  will transition him to Lovenox till D-dimer trends down.  Monitor clinically.  If develops worsening hypoxia will consider CTA chest.  Paroxysmal atrial fibrillation with acute RVR: Currently in mild RVR added Cardizem due to metoprolol allergy, anticoagulation as above, last echo in chart shows a preserved EF of 55 to 60%.  Hepatic abscess: Status post percutaneous drain placement 10/18 - treated with Rocephin and Flagyl -was to have repeat imaging at [facility]  IR was consulted and drain was removed on 03/08/2019 per [facility] plan and repeat CT.  Still has tiny abscess, continue ABX. [facility] follow up post DC.  CAD - PCI to RCA and LCx February 2016, not on any maintenance medications.  Hypothyroidism: Synthroid  Debility/deconditioning: Secondary to acute illness-PT/OT ordered.  Feels SNF multiple times.  Will go with home PT.  Home PT and oxygen have been ordered.  Obesity: BMI of 33.  Follow with PCP for weight loss.   Acute on chronic diastolic CHF, baseline EF 60% on echocardiogram done April 2018.  Lasix dependent at home.  Lasix resumed on 03/07/2019.  Appears compensated now.  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    Condition - Extremely Guarded  Family Communication  : Son updated over the phone on 11/6, 11/7, 11/9  Code Status :  Full Code  Diet :  Diet Order            Diet regular Room service appropriate? Yes; Fluid consistency: Thin  Diet effective now               Disposition Plan  : Home health PT, patient  clearly refused SNF multiple times.  Barriers to discharge: Finished remdesivir course and clinical improvement  Consults  : IR for liver drain management  Procedures  :  None  Antibiotics  :    Anti-infectives (From admission, onward)   Start     Dose/Rate Route Frequency Ordered Stop   03/03/19 1600  remdesivir 100 mg in sodium chloride 0.9 % 250 mL IVPB     100 mg 500 mL/hr over 30 Minutes Intravenous Every 24 hours 03/02/19 2024 03/06/19 1659   03/03/19 1600  metroNIDAZOLE (FLAGYL) tablet 500 mg     500 mg Oral 3 times daily 03/03/19 1248     03/03/19 1300  ciprofloxacin (CIPRO) tablet 500 mg  Status:  Discontinued     500 mg Oral 2 times daily 03/03/19 1248 03/03/19 1249   03/03/19 0200  vancomycin (VANCOCIN) IVPB 1000 mg/200 mL premix  Status:  Discontinued     1,000 mg 200 mL/hr over 60 Minutes Intravenous Every 12 hours 03/02/19 1424 03/02/19 1701   03/02/19 2200  ceFEPIme (MAXIPIME) 2 g in sodium chloride 0.9 % 100 mL IVPB  Status:  Discontinued     2 g 200 mL/hr over 30 Minutes Intravenous Every 8 hours 03/02/19 1310 03/02/19 1701   03/02/19 2200  ciprofloxacin (CIPRO) tablet 500 mg     500 mg Oral 2 times daily 03/02/19 2031     03/02/19 2130  remdesivir 200 mg in sodium chloride 0.9 % 250 mL IVPB     200 mg 500 mL/hr over 30 Minutes Intravenous Once 03/02/19 2024 03/04/19 0120   03/02/19 1430  vancomycin (VANCOCIN) IVPB 1000 mg/200 mL premix     1,000 mg 200 mL/hr over 60 Minutes Intravenous  Once 03/02/19 1420 03/02/19 1659   03/02/19 1215  vancomycin (VANCOCIN) IVPB 1000 mg/200 mL premix  1,000 mg 200 mL/hr over 60 Minutes Intravenous Every 1 hr x 2 03/02/19 1146 03/02/19 1416   03/02/19 1145  ceFEPIme (MAXIPIME) 2 g in sodium chloride 0.9 % 100 mL IVPB     2 g 200 mL/hr over 30 Minutes Intravenous  Once 03/02/19 1136 03/02/19 1315   03/02/19 1145  vancomycin (VANCOCIN) IVPB 1000 mg/200 mL premix  Status:  Discontinued     1,000 mg 200 mL/hr over 60 Minutes  Intravenous  Once 03/02/19 1136 03/02/19 1146     DVT Prophylaxis  : Eliquis >> Lovenox  Inpatient Medications  Scheduled Meds:  allopurinol  300 mg Oral q morning - 10a   ciprofloxacin  500 mg Oral BID   dexamethasone (DECADRON) injection  6 mg Intravenous Q24H   diltiazem  90 mg Oral Q8H   enoxaparin (LOVENOX) injection  115 mg Subcutaneous Q12H   furosemide  40 mg Oral Daily   levothyroxine  137 mcg Oral QAC breakfast   metroNIDAZOLE  500 mg Oral TID   multivitamin with minerals  1 tablet Oral Daily   vitamin C  500 mg Oral Daily   zinc sulfate  220 mg Oral Daily   Continuous Infusions:  PRN Meds:.acetaminophen **OR** [DISCONTINUED] acetaminophen, albuterol, ALPRAZolam, chlorpheniramine-HYDROcodone, diltiazem, guaiFENesin-dextromethorphan, [DISCONTINUED] ondansetron **OR** ondansetron (ZOFRAN) IV, oxyCODONE, polyethylene glycol, traZODone   Time Spent in minutes  25  See all Orders from today for further details   Lala Lund M.D on 03/09/2019 at 9:15 AM  To page go to [email or web address] - use universal password  [facility] -  Office  [phone]    Objective:   Vitals:   03/08/19 1132 03/08/19 1622 03/08/19 1717 03/08/19 2200  BP: (!) 84/55 119/74  125/76  Pulse: (!) 46 76    Resp: (!) 21 (!) 22    Temp: 97.8 F (36.6 C) 98 F (36.7 C)  98 F (36.7 C)  TempSrc: Oral Oral  Oral  SpO2: (!) 89% 94% 90%   Weight:      Height:        Wt Readings from Last 3 Encounters:  03/02/19 113.4 kg  02/06/19 113.4 kg  09/03/17 117 kg     Intake/Output Summary (Last 24 hours) at 03/09/2019 0915 Last data filed at 03/09/2019 0600 Gross per 24 hour  Intake 240 ml  Output 400 ml  Net -160 ml     Physical Exam  Awake Alert, Oriented X 3, No new F.N deficits, Normal affect [place].AT,PERRAL Supple Neck,No JVD, No cervical lymphadenopathy appriciated.  Symmetrical Chest wall movement, Good air movement bilaterally, CTAB RRR,No Gallops, Rubs or new  Murmurs, No Parasternal Heave +ve B.Sounds, Abd Soft, No tenderness, No organomegaly appriciated, No rebound - guarding or rigidity. No Cyanosis, Clubbing or edema, Right upper quadrant JP drain in place,   Data Review:    CBC Recent Labs  Lab 03/05/19 0620 03/06/19 0009 03/07/19 0430 03/08/19 0003 03/09/19 0750  WBC 10.4 9.5 12.6* 9.3 17.5*  HGB 14.6 14.0 13.6 13.7 14.9  HCT 45.0 43.7 41.6 42.1 45.5  PLT 96* 108* 115* 118* 123*  MCV 98.3 99.3 98.8 99.5 98.1  MCH 31.9 31.8 32.3 32.4 32.1  MCHC 32.4 32.0 32.7 32.5 32.7  RDW 14.6 14.6 14.7 14.9 15.3  LYMPHSABS 0.6* 0.7 0.7 0.5* 0.5*  MONOABS 0.7 0.7 0.7 0.4 0.6  EOSABS 0.0 0.0 0.0 0.0 0.0  BASOSABS 0.0 0.0 0.0 0.0 0.0    Chemistries  Recent Labs  Lab 03/04/19 0601 03/05/19 0620 03/06/19  0009 03/07/19 0430 03/08/19 0003  NA 141 141 140 139 139  K 4.2 4.0 4.5 4.4 4.5  CL 102 104 102 104 101  CO2 27 25 26 27 25   GLUCOSE 125* 126* 116* 110* 127*  BUN 29* 34* 31* 29* 31*  CREATININE 0.86 0.94 0.93 0.80 0.85  CALCIUM 8.2* 8.1* 8.3* 8.0* 8.1*  MG 2.6* 2.4 2.4 2.3 2.2  AST 33 29 25 23 27   ALT 19 24 22 22 25   ALKPHOS 55 66 62 55 60  BILITOT 0.8 0.7 0.6 0.6 0.5   ------------------------------------------------------------------------------------------------------------------ No results for input(s): CHOL, HDL, LDLCALC, TRIG, CHOLHDL, LDLDIRECT in the last 72 hours.  Lab Results  Component Value Date   HGBA1C 5.8 (H) 08/08/2014   ------------------------------------------------------------------------------------------------------------------ No results for input(s): TSH, T4TOTAL, T3FREE, THYROIDAB in the last 72 hours.  Invalid input(s): FREET3 ------------------------------------------------------------------------------------------------------------------ No results for input(s): VITAMINB12, FOLATE, FERRITIN, TIBC, IRON, RETICCTPCT in the last 72 hours.  Coagulation profile No results for input(s): INR,  PROTIME in the last 168 hours.  Recent Labs    03/07/19 0430 03/08/19 0003  DDIMER 13.97* 8.06*    Cardiac Enzymes No results for input(s): CKMB, TROPONINI, MYOGLOBIN in the last 168 hours.  Invalid input(s): CK ------------------------------------------------------------------------------------------------------------------    Component Value Date/Time   BNP 26.2 03/08/2019 0003    Micro Results Recent Results (from the past 240 hour(s))  SARS Coronavirus 2 by RT PCR (hospital order, performed in [facility] hospital lab) Nasopharyngeal Nasopharyngeal Swab     Status: Abnormal   Collection Time: 03/02/19 11:08 AM   Specimen: Nasopharyngeal Swab  Result Value Ref Range Status   SARS Coronavirus 2 POSITIVE (A) NEGATIVE Final    Comment: RESULT CALLED TO, READ BACK BY AND VERIFIED WITH: R HOLCOLMB AT 1410 BY HFLYNT 03/02/19 (NOTE) If result is NEGATIVE SARS-CoV-2 target nucleic acids are NOT DETECTED. The SARS-CoV-2 RNA is generally detectable in upper and lower  respiratory specimens during the acute phase of infection. The lowest  concentration of SARS-CoV-2 viral copies this assay can detect is 250  copies / mL. A negative result does not preclude SARS-CoV-2 infection  and should not be used as the sole basis for treatment or other  patient management decisions.  A negative result may occur with  improper specimen collection / handling, submission of specimen other  than nasopharyngeal swab, presence of viral mutation(s) within the  areas targeted by this assay, and inadequate number of viral copies  (<250 copies / mL). A negative result must be combined with clinical  observations, patient history, and epidemiological information. If result is POSITIVE SARS-CoV-2 target nucleic acids are DETECTED.  The SARS-CoV-2 RNA is generally detectable in upper and lower  respiratory specimens during the acute phase of infection.  Positive  results are indicative of active  infection with SARS-CoV-2.  Clinical  correlation with patient history and other diagnostic information is  necessary to determine patient infection status.  Positive results do  not rule out bacterial infection or co-infection with other viruses. If result is PRESUMPTIVE POSTIVE SARS-CoV-2 nucleic acids MAY BE PRESENT.   A presumptive positive result was obtained on the submitted specimen  and confirmed on repeat testing.  While 2019 novel coronavirus  (SARS-CoV-2) nucleic acids may be present in the submitted sample  additional confirmatory testing may be necessary for epidemiological  and / or clinical management purposes  to differentiate between  SARS-CoV-2 and other Sarbecovirus currently known to infect humans.  If clinically indicated additional testing  with an alternate test  methodology [phone])  is advised. The SARS-CoV-2 RNA is generally  detectable in upper and lower respiratory specimens during the acute  phase of infection. The expected result is Negative. Fact Sheet for Patients:  StrictlyIdeas.no Fact Sheet for Healthcare Providers: BankingDealers.co.za This test is not yet approved or cleared by the [place] FDA and has been authorized for detection and/or diagnosis of SARS-CoV-2 by FDA under an Emergency Use Authorization (EUA).  This EUA will remain in effect (meaning this test can be used) for the duration of the COVID-19 declaration under Section 564(b)(1) of the Act, 21 U.S.C. section 360bbb-3(b)(1), unless the authorization is terminated or revoked sooner. Performed at [facility], [address] [place], [place] [address]   Blood Culture (routine x 2)     Status: None   Collection Time: 03/02/19 11:37 AM   Specimen: BLOOD LEFT ARM  Result Value Ref Range Status   Specimen Description BLOOD LEFT ARM  Final   Special Requests   Final    BOTTLES DRAWN AEROBIC AND ANAEROBIC Blood Culture adequate volume    Culture   Final    NO GROWTH 5 DAYS Performed at [facility], [address] [place], [place] [address]    Report Status 03/07/2019 FINAL  Final  Blood Culture (routine x 2)     Status: None   Collection Time: 03/02/19 11:37 AM   Specimen: BLOOD LEFT ARM  Result Value Ref Range Status   Specimen Description BLOOD LEFT ARM  Final   Special Requests   Final    BOTTLES DRAWN AEROBIC AND ANAEROBIC Blood Culture adequate volume   Culture   Final    NO GROWTH 5 DAYS Performed at [facility], [address] [place], [place] [address]    Report Status 03/07/2019 FINAL  Final    Radiology Reports Dg Pelvis Portable  Result Date: 03/02/2019 CLINICAL DATA:  Right hip pain.  No injury. EXAM: PORTABLE PELVIS 1-2 VIEWS COMPARISON:  CT abdomen pelvis dated February 06, 2019. FINDINGS: No acute fracture or dislocation. Prior left proximal femur ORIF. Unchanged mild bilateral hip joint space narrowing with small marginal osteophytes. The pubic symphysis and sacroiliac joints are unremarkable. Lower lumbar spondylosis. Bone mineralization is normal. Soft tissues are unremarkable. IMPRESSION: 1.  No acute osseous abnormality. 2. Unchanged mild bilateral hip degenerative changes. Electronically Signed   By: Titus Dubin M.D.   On: 03/02/2019 11:27   Dg Chest Port 1 View  Result Date: 03/04/2019 CLINICAL DATA:  COVID-19 pneumonia. EXAM: PORTABLE CHEST 1 VIEW COMPARISON:  03/03/2019 FINDINGS: Lordotic technique demonstrated. Lungs are adequately inflated with slight overall worsening bilateral patchy airspace process left lung worse than right. No definite effusion. Cardiomediastinal silhouette and remainder of the exam is unchanged. IMPRESSION: Slight interval worsening of diffuse bilateral patchy airspace process compatible with patient's known COVID-19 pneumonia. Electronically Signed   By: Marin Olp M.D.   On: 03/04/2019 09:00   Dg Chest Port 1 View  Result Date: 03/03/2019 CLINICAL DATA:   Shortness of breath. EXAM: PORTABLE CHEST 1 VIEW COMPARISON:  03/02/2019 FINDINGS: Low lung volumes with interstitial and airspace opacities unchanged compared to prior study. Worse in the peripheral left chest. Cardiomediastinal contours are stable. No signs of acute bone finding with evidence of prior cervicothoracic spinal fusion, partially imaged. IMPRESSION: Findings remain concerning for viral or atypical pneumonia. Asymmetric edema could have a similar appearance. Electronically Signed   By: Zetta Bills M.D.   On: 03/03/2019 11:43  Dg Chest Port 1 View  Result Date: 03/02/2019 CLINICAL DATA:  Severe shortness of breath. EXAM: PORTABLE CHEST 1 VIEW COMPARISON:  Chest x-ray dated February 06, 2019. FINDINGS: The heart size and mediastinal contours are within normal limits. Normal pulmonary vascularity. Patchy asymmetric peripheral opacities in the left greater than right mid to lower lungs. No pleural effusion or pneumothorax. Unchanged elevation of the right hemidiaphragm. No acute osseous abnormality. IMPRESSION: 1. There are findings in the lungs which are nonspecific, but concerning for atypical infection, including potential viral pneumonia. Electronically Signed   By: Titus Dubin M.D.   On: 03/02/2019 11:25   Ct Liver Abdomen W Wo Contrast  Result Date: 03/08/2019 CLINICAL DATA:  Inpatient. Percutaneous drainage of reported liver abscess at outside facility on 02/13/2019. Currently hospitalized with COVID-19 pneumonia. EXAM: CT ABDOMEN WITHOUT AND WITH CONTRAST TECHNIQUE: Multidetector CT imaging of the abdomen was performed following the standard protocol before and following the bolus administration of intravenous contrast. CONTRAST:  134mL OMNIPAQUE IOHEXOL 350 MG/ML SOLN COMPARISON:  02/06/2019 CT abdomen/pelvis. FINDINGS: Lower chest: Extensive patchy peripheral ground-glass opacity and bandlike consolidation throughout both lung bases, new since 02/06/2019. Coronary atherosclerosis.  Hepatobiliary: Normal liver size. Hypodense 2.3 x 1.8 cm segment 7 right liver lesion (series 8/image 23) with percutaneous drain at inferior margin, decreased from 5.2 x 4.7 cm on 02/06/2019 CT, without appreciable solid enhancement or wall thickening. Subcentimeter hypodense extreme right liver dome lesion (series 8/image 5), too small to characterize, not definitely seen on prior noncontrast CT. Simple extreme inferior right liver lobe 1.3 cm cyst. No additional liver lesions. Cholecystectomy. No biliary ductal dilatation. Pancreas: Normal, with no mass or duct dilation. Spleen: Normal size. No mass. Adrenals/Urinary Tract: Normal adrenals. No renal stones. No hydronephrosis. Small simple left renal cysts, largest 2.3 cm in the posterior upper left kidney. Stomach/Bowel: Normal non-distended stomach. Visualized small and large bowel is normal caliber, with no bowel wall thickening. Vascular/Lymphatic: Atherosclerotic abdominal aorta with stable 3.2 cm infrarenal abdominal aortic aneurysm using similar measurement technique. Patent portal, splenic, hepatic and renal veins. No pathologically enlarged lymph nodes in the abdomen. Other: No pneumoperitoneum, ascites or focal fluid collection. Musculoskeletal: No aggressive appearing focal osseous lesions. Marked thoracolumbar spondylosis. IMPRESSION: 1. Percutaneous drain terminates at the inferior margin of a low-attenuation 2.3 x 1.8 cm segment 7 right liver lesion, which has significantly decreased in size from 5.2 x 4.7 cm on 02/06/2019 CT, and which demonstrates no solid enhancement or wall thickening. Findings favor decreased size of a complex liver cyst (which has been present on CT studies back to 2014). 2. Extensive patchy peripheral ground-glass opacity and bandlike consolidation at the lung bases, new since 02/06/2019, compatible with COVID-19 pneumonia. 3. Infrarenal 3.2 cm Abdominal Aortic Aneurysm (ICD10-I71.9). Recommend follow-up aortic ultrasound in 3  years. This recommendation follows ACR consensus guidelines: White Paper of the ACR Incidental Findings Committee II on Vascular Findings. J Am Coll Radiol 2013CJ:[id number]. 4.  Aortic Atherosclerosis (ICD10-I70.0). Electronically Signed   By: Ilona Sorrel M.D.   On: 03/08/2019 14:14   Vas [place] Lower Extremity Venous (dvt)  Result Date: 03/05/2019  Lower Venous Study Other Indications: Covid Positive, elevated d-diimer. Comparison Study: No priors Performing Technologist: Velva Harman Sturdivant RDMS, RVT  Examination Guidelines: A complete evaluation includes B-mode imaging, spectral Doppler, color Doppler, and power Doppler as needed of all accessible portions of each vessel. Bilateral testing is considered an integral part of a complete examination. Limited examinations for reoccurring indications may be performed as  noted.  +---------+---------------+---------+-----------+----------+--------------+  RIGHT     Compressibility Phasicity Spontaneity Properties Thrombus Aging  +---------+---------------+---------+-----------+----------+--------------+  CFV       Full            Yes       Yes                                    +---------+---------------+---------+-----------+----------+--------------+  SFJ       Full                                                             +---------+---------------+---------+-----------+----------+--------------+  FV Prox   Full                                                             +---------+---------------+---------+-----------+----------+--------------+  FV Mid    Full                                                             +---------+---------------+---------+-----------+----------+--------------+  FV Distal Full                                                             +---------+---------------+---------+-----------+----------+--------------+  PFV       Full                                                              +---------+---------------+---------+-----------+----------+--------------+  POP       Full            Yes       Yes                                    +---------+---------------+---------+-----------+----------+--------------+  PTV       Full                                                             +---------+---------------+---------+-----------+----------+--------------+  PERO      Full                                                             +---------+---------------+---------+-----------+----------+--------------+   +---------+---------------+---------+-----------+----------+--------------+  LEFT      Compressibility Phasicity Spontaneity Properties Thrombus Aging  +---------+---------------+---------+-----------+----------+--------------+  CFV       Full            Yes       Yes                                    +---------+---------------+---------+-----------+----------+--------------+  SFJ       Full                                                             +---------+---------------+---------+-----------+----------+--------------+  FV Prox   Full                                                             +---------+---------------+---------+-----------+----------+--------------+  FV Mid    Full                                                             +---------+---------------+---------+-----------+----------+--------------+  FV Distal Full                                                             +---------+---------------+---------+-----------+----------+--------------+  PFV       Full                                                             +---------+---------------+---------+-----------+----------+--------------+  POP       Full            Yes       Yes                                    +---------+---------------+---------+-----------+----------+--------------+  PTV       Full                                                              +---------+---------------+---------+-----------+----------+--------------+  PERO      Full                                                             +---------+---------------+---------+-----------+----------+--------------+  Summary: Right: There is no evidence of deep vein thrombosis in the lower extremity. No cystic structure found in the popliteal fossa. Left: There is no evidence of deep vein thrombosis in the lower extremity. No cystic structure found in the popliteal fossa.  *See table(s) above for measurements and observations. Electronically signed by Curt Jews MD on 03/05/2019 at 7:06:10 AM.    Final

## 2019-03-09 NOTE — Progress Notes (Addendum)
0740: Assumed care of Pt from night RN. Pt alert, denies pain, VS WNL, SpO2 90% on 4L [place]. 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 Pt repositioning self frequently in bed, peri care complete, call bell within reach.  1400: pt medicated for back pain, no other needs at this time.  1600: Pt reassessed, in the chair, denies pain. Says this is the best he has felt in a while. VS WNL, call bell within reach  1800: Encouraged Pt to get back to bed for rest this evening, but Pt insisted he wanted to stay in the chair. Pt made comfortable, call bell within reach

## 2019-03-09 NOTE — Progress Notes (Signed)
03/09/19 7p-7a Report received from [place] and care assumed. Pt sitting in chair with call bell in reach NADN Needs met Will continue to monitor

## 2019-03-10 LAB — CBC WITH DIFFERENTIAL/PLATELET
Abs Immature Granulocytes: 0.21 10*3/uL — ABNORMAL HIGH (ref 0.00–0.07)
Basophils Absolute: 0 10*3/uL (ref 0.0–0.1)
Basophils Relative: 0 %
Eosinophils Absolute: 0 10*3/uL (ref 0.0–0.5)
Eosinophils Relative: 0 %
HCT: 45.3 % (ref 39.0–52.0)
Hemoglobin: 14.7 g/dL (ref 13.0–17.0)
Immature Granulocytes: 2 %
Lymphocytes Relative: 3 %
Lymphs Abs: 0.4 10*3/uL — ABNORMAL LOW (ref 0.7–4.0)
MCH: 31.9 pg (ref 26.0–34.0)
MCHC: 32.5 g/dL (ref 30.0–36.0)
MCV: 98.3 fL (ref 80.0–100.0)
Monocytes Absolute: 0.6 10*3/uL (ref 0.1–1.0)
Monocytes Relative: 4 %
Neutro Abs: 13 10*3/uL — ABNORMAL HIGH (ref 1.7–7.7)
Neutrophils Relative %: 91 %
Platelets: 119 10*3/uL — ABNORMAL LOW (ref 150–400)
RBC: 4.61 MIL/uL (ref 4.22–5.81)
RDW: 15.2 % (ref 11.5–15.5)
WBC: 14.2 10*3/uL — ABNORMAL HIGH (ref 4.0–10.5)
nRBC: 0.1 % (ref 0.0–0.2)

## 2019-03-10 LAB — COMPREHENSIVE METABOLIC PANEL
ALT: 37 U/L (ref 0–44)
AST: 35 U/L (ref 15–41)
Albumin: 2.8 g/dL — ABNORMAL LOW (ref 3.5–5.0)
Alkaline Phosphatase: 57 U/L (ref 38–126)
Anion gap: 9 (ref 5–15)
BUN: 30 mg/dL — ABNORMAL HIGH (ref 8–23)
CO2: 24 mmol/L (ref 22–32)
Calcium: 8.1 mg/dL — ABNORMAL LOW (ref 8.9–10.3)
Chloride: 103 mmol/L (ref 98–111)
Creatinine, Ser: 0.77 mg/dL (ref 0.61–1.24)
GFR calc Af Amer: >60 mL/min (ref >60)
GFR calc non Af Amer: >60 mL/min (ref >60)
Glucose, Bld: 136 mg/dL — ABNORMAL HIGH (ref 70–99)
Potassium: 4.9 mmol/L (ref 3.5–5.1)
Sodium: 136 mmol/L (ref 135–145)
Total Bilirubin: 0.9 mg/dL (ref 0.3–1.2)
Total Protein: 6.3 g/dL — ABNORMAL LOW (ref 6.5–8.1)

## 2019-03-10 LAB — GLUCOSE, CAPILLARY: Glucose-Capillary: 168 mg/dL — ABNORMAL HIGH (ref 70–99)

## 2019-03-10 LAB — MAGNESIUM: Magnesium: 2.3 mg/dL (ref 1.7–2.4)

## 2019-03-10 LAB — D-DIMER, QUANTITATIVE: D-Dimer, Quant: 2.85 ug/mL-FEU — ABNORMAL HIGH (ref 0.00–0.50)

## 2019-03-10 LAB — C-REACTIVE PROTEIN: CRP: <0.8 mg/dL (ref <1.0)

## 2019-03-10 LAB — BRAIN NATRIURETIC PEPTIDE: B Natriuretic Peptide: 24.1 pg/mL (ref 0.0–100.0)

## 2019-03-10 MED ORDER — DEXAMETHASONE SODIUM PHOSPHATE 4 MG/ML IJ SOLN
4.0000 mg | INTRAMUSCULAR | Status: DC
  Administered 2019-03-10: 4 mg via INTRAVENOUS
  Filled 2019-03-10: qty 1

## 2019-03-10 NOTE — Progress Notes (Signed)
SATURATION QUALIFICATIONS: (This note is used to comply with regulatory documentation for home oxygen)  Patient Saturations on Room Air at Rest = 88%  Patient Saturations on Room Air while Ambulating = 77%  Patient Saturations on 2 Liters of oxygen while Ambulating = 83%  Please briefly explain why patient needs home oxygen: to maintain 02 sats within therapeutic ranges with mobilizing at home.  Add: pt becomes very apprehensive with any mobility which in turn leads to desaturation, he requires increased encouragement and motivation to complete pursed lip breathing and recover.  Horald Chestnut, PT

## 2019-03-10 NOTE — Progress Notes (Addendum)
PROGRESS NOTE                                                                                                                                                                                                             Patient Demographics:    Wayne Ortiz, is a 73 y.o. male, DOB - 1945/09/08, SH:[id number]  Outpatient Primary MD for the patient is Celene Squibb, MD   Admit date - 03/02/2019   LOS - 8  Chief Complaint  Patient presents with   Shortness of Breath       Brief Narrative: Patient is a 73 y.o. male with PMHx of HFpEF (02/2018 EF 65%)and CAD (05/2014 RCA & LCX PCI), liver abscess s/p percutaneous JP drain placement (at [facility]) on 10/18 (Cipro through 03/14/19), PAF on anticoagulation-who was Covid positive at [facility] on 10/26 and 11/1-subsequently discharged home-who presented to [facility] emergency department with gradual worsening shortness of breath-found to have acute hypoxic respiratory failure secondary to COVID-19 pneumonia, subsequently transferred to triad hospitalist service at [facility].  Significant Events: 10/26 Covid positive at [facility] 11/1 CTA chest [place] consistent with Covid pneumonitis 11/4 admit to [facility] via [facility]   Subjective:   Patient in bed, appears comfortable, denies any headache, no fever, no chest pain or pressure, no shortness of breath , no abdominal pain. No focal weakness.   Assessment  & Plan :   Acute Hypoxic Resp Failure due to Covid 19 Viral pneumonia: He was appropriately started on IV steroids and remdesivir combination with good effect, initially requiring 6 L nasal cannula oxygen now down to 3-4 L.  Clinically improving.  Inflammatory markers improving as well except for D-dimer.  SpO2: 92 % O2 Flow Rate (L/min): 1 L/min FiO2 (%): 100 %   COVID-19 Labs: Recent Labs    03/08/19 0003 03/09/19 0750 03/10/19 0155  DDIMER 8.06* 6.47* 2.85*  CRP 1.0* <0.8  <0.8    Lab Results  Component Value Date   SARSCOV2NAA POSITIVE (A) 03/02/2019   SARSCOV2NAA NOT DETECTED 02/06/2019     COVID-19 Medications: Steroids: 11/4>> Remdesivir: 11/4>> Actemra: Not given due to ongoing hepatic abscess Convalescent Plasma: 11/5 x 1    Markedly elevated D-dimer: Likely due to intense underlying inflammation however DVT cannot be ruled out, lower extremity venous duplex is negative, he is already  on Eliquis and temporarily placed on Lovenox, D Dimer trending down, recheck in am.  Paroxysmal atrial fibrillation with acute RVR: Currently in mild RVR added Cardizem due to metoprolol allergy, anticoagulation as above, last echo in chart shows a preserved EF of 55 to 60%.  Hepatic abscess: Status post percutaneous drain placement 10/18 - treated with Rocephin and Flagyl -was to have repeat imaging at [facility]  IR was consulted and drain was removed on 03/08/2019 per [facility] plan and repeat CT.  Still has tiny abscess, continue ABX. [facility] follow up post DC.  CAD - PCI to RCA and LCx February 2016, not on any maintenance medications.  Hypothyroidism: Synthroid  Debility/deconditioning: Secondary to acute illness-PT/OT ordered.  Feels SNF multiple times.  Will go with home PT.  Home PT and oxygen have been ordered.  Obesity: BMI of 33.  Follow with PCP for weight loss.   Acute on chronic diastolic CHF, baseline EF 60% on echocardiogram done April 2018.  Lasix dependent at home.  Lasix resumed on 03/07/2019.  Appears compensated now.  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    Condition - Extremely Guarded  Family Communication  : Son updated over the phone on 11/6, 11/7, 11/9, 11/12  Code Status :  Full Code  Diet :  Diet Order            Diet regular Room service appropriate? Yes; Fluid consistency: Thin  Diet effective now               Disposition Plan  : Home health PT, patient clearly refused SNF multiple times.  DC likely 03/11/19  Consults  : IR for liver drain management  Procedures  :  None  Antibiotics  :    Anti-infectives (From admission, onward)   Start     Dose/Rate Route Frequency Ordered Stop   03/03/19 1600  remdesivir 100 mg in sodium chloride 0.9 % 250 mL IVPB     100 mg 500 mL/hr over 30 Minutes Intravenous Every 24 hours 03/02/19 2024 03/06/19 1659   03/03/19 1600  metroNIDAZOLE (FLAGYL) tablet 500 mg     500 mg Oral 3 times daily 03/03/19 1248     03/03/19 1300  ciprofloxacin (CIPRO) tablet 500 mg  Status:  Discontinued     500 mg Oral 2 times daily 03/03/19 1248 03/03/19 1249   03/03/19 0200  vancomycin (VANCOCIN) IVPB 1000 mg/200 mL premix  Status:  Discontinued     1,000 mg 200 mL/hr over 60 Minutes Intravenous Every 12 hours 03/02/19 1424 03/02/19 1701   03/02/19 2200  ceFEPIme (MAXIPIME) 2 g in sodium chloride 0.9 % 100 mL IVPB  Status:  Discontinued     2 g 200 mL/hr over 30 Minutes Intravenous Every 8 hours 03/02/19 1310 03/02/19 1701   03/02/19 2200  ciprofloxacin (CIPRO) tablet 500 mg     500 mg Oral 2 times daily 03/02/19 2031     03/02/19 2130  remdesivir 200 mg in sodium chloride 0.9 % 250 mL IVPB     200 mg 500 mL/hr over 30 Minutes Intravenous Once 03/02/19 2024 03/04/19 0120   03/02/19 1430  vancomycin (VANCOCIN) IVPB 1000 mg/200 mL premix     1,000 mg 200 mL/hr over 60 Minutes Intravenous  Once 03/02/19 1420 03/02/19 1659   03/02/19 1215  vancomycin (VANCOCIN) IVPB 1000 mg/200 mL premix     1,000 mg 200 mL/hr over 60 Minutes Intravenous Every 1 hr  x 2 03/02/19 1146 03/02/19 1416   03/02/19 1145  ceFEPIme (MAXIPIME) 2 g in sodium chloride 0.9 % 100 mL IVPB     2 g 200 mL/hr over 30 Minutes Intravenous  Once 03/02/19 1136 03/02/19 1315   03/02/19 1145  vancomycin (VANCOCIN) IVPB 1000 mg/200 mL premix  Status:  Discontinued     1,000 mg 200 mL/hr over 60 Minutes Intravenous  Once 03/02/19 1136 03/02/19 1146     DVT Prophylaxis  : Eliquis >>  Lovenox  Inpatient Medications  Scheduled Meds:  allopurinol  300 mg Oral q morning - 10a   ciprofloxacin  500 mg Oral BID   dexamethasone (DECADRON) injection  4 mg Intravenous Q24H   diltiazem  90 mg Oral Q8H   enoxaparin (LOVENOX) injection  115 mg Subcutaneous Q12H   furosemide  40 mg Oral Daily   levothyroxine  137 mcg Oral QAC breakfast   metroNIDAZOLE  500 mg Oral TID   multivitamin with minerals  1 tablet Oral Daily   vitamin C  500 mg Oral Daily   zinc sulfate  220 mg Oral Daily   Continuous Infusions:  PRN Meds:.acetaminophen **OR** [DISCONTINUED] acetaminophen, albuterol, ALPRAZolam, chlorpheniramine-HYDROcodone, diltiazem, guaiFENesin-dextromethorphan, [DISCONTINUED] ondansetron **OR** ondansetron (ZOFRAN) IV, oxyCODONE, polyethylene glycol, traZODone   Time Spent in minutes  25  See all Orders from today for further details   Lala Lund M.D on 03/10/2019 at 9:08 AM  To page go to [email or web address] - use universal password  [facility] -  Office  [phone]    Objective:   Vitals:   03/09/19 1648 03/09/19 1940 03/10/19 0604 03/10/19 0757  BP: 119/76 (!) 125/91 128/73 119/69  Pulse: 70 (!) 59  74  Resp: 19 19  18   Temp: 98.1 F (36.7 C) 97.6 F (36.4 C)  97.7 F (36.5 C)  TempSrc: Oral Oral  Oral  SpO2:  91%  92%  Weight:      Height:        Wt Readings from Last 3 Encounters:  03/02/19 113.4 kg  02/06/19 113.4 kg  09/03/17 117 kg     Intake/Output Summary (Last 24 hours) at 03/10/2019 0908 Last data filed at 03/10/2019 0759 Gross per 24 hour  Intake 560 ml  Output 850 ml  Net -290 ml     Physical Exam  Awake Alert,  No new F.N deficits, Normal affect Center.AT,PERRAL Supple Neck,No JVD, No cervical lymphadenopathy appriciated.  Symmetrical Chest wall movement, Good air movement bilaterally, CTAB RRR,No Gallops, Rubs or new Murmurs, No Parasternal Heave +ve B.Sounds, Abd Soft, No tenderness, No organomegaly  appriciated, No rebound - guarding or rigidity. No Cyanosis, Clubbing or edema,     Data Review:    CBC Recent Labs  Lab 03/06/19 0009 03/07/19 0430 03/08/19 0003 03/09/19 0750 03/10/19 0155  WBC 9.5 12.6* 9.3 17.5* 14.2*  HGB 14.0 13.6 13.7 14.9 14.7  HCT 43.7 41.6 42.1 45.5 45.3  PLT 108* 115* 118* 123* 119*  MCV 99.3 98.8 99.5 98.1 98.3  MCH 31.8 32.3 32.4 32.1 31.9  MCHC 32.0 32.7 32.5 32.7 32.5  RDW 14.6 14.7 14.9 15.3 15.2  LYMPHSABS 0.7 0.7 0.5* 0.5* 0.4*  MONOABS 0.7 0.7 0.4 0.6 0.6  EOSABS 0.0 0.0 0.0 0.0 0.0  BASOSABS 0.0 0.0 0.0 0.0 0.0    Chemistries  Recent Labs  Lab 03/06/19 0009 03/07/19 0430 03/08/19 0003 03/09/19 0750 03/10/19 0155  NA 140 139 139 135 136  K 4.5 4.4 4.5 4.9 4.9  CL  102 104 101 103 103  CO2 26 27 25  21* 24  GLUCOSE 116* 110* 127* 117* 136*  BUN 31* 29* 31* 31* 30*  CREATININE 0.93 0.80 0.85 0.86 0.77  CALCIUM 8.3* 8.0* 8.1* 8.2* 8.1*  MG 2.4 2.3 2.2 2.2 2.3  AST 25 23 27 28  35  ALT 22 22 25  33 37  ALKPHOS 62 55 60 61 57  BILITOT 0.6 0.6 0.5 0.8 0.9   ------------------------------------------------------------------------------------------------------------------ No results for input(s): CHOL, HDL, LDLCALC, TRIG, CHOLHDL, LDLDIRECT in the last 72 hours.  Lab Results  Component Value Date   HGBA1C 5.8 (H) 08/08/2014   ------------------------------------------------------------------------------------------------------------------ No results for input(s): TSH, T4TOTAL, T3FREE, THYROIDAB in the last 72 hours.  Invalid input(s): FREET3 ------------------------------------------------------------------------------------------------------------------ No results for input(s): VITAMINB12, FOLATE, FERRITIN, TIBC, IRON, RETICCTPCT in the last 72 hours.  Coagulation profile No results for input(s): INR, PROTIME in the last 168 hours.  Recent Labs    03/09/19 0750 03/10/19 0155  DDIMER 6.47* 2.85*    Cardiac Enzymes No  results for input(s): CKMB, TROPONINI, MYOGLOBIN in the last 168 hours.  Invalid input(s): CK ------------------------------------------------------------------------------------------------------------------    Component Value Date/Time   BNP 24.1 03/10/2019 0155    Micro Results Recent Results (from the past 240 hour(s))  SARS Coronavirus 2 by RT PCR (hospital order, performed in [facility] hospital lab) Nasopharyngeal Nasopharyngeal Swab     Status: Abnormal   Collection Time: 03/02/19 11:08 AM   Specimen: Nasopharyngeal Swab  Result Value Ref Range Status   SARS Coronavirus 2 POSITIVE (A) NEGATIVE Final    Comment: RESULT CALLED TO, READ BACK BY AND VERIFIED WITH: R HOLCOLMB AT 1410 BY HFLYNT 03/02/19 (NOTE) If result is NEGATIVE SARS-CoV-2 target nucleic acids are NOT DETECTED. The SARS-CoV-2 RNA is generally detectable in upper and lower  respiratory specimens during the acute phase of infection. The lowest  concentration of SARS-CoV-2 viral copies this assay can detect is 250  copies / mL. A negative result does not preclude SARS-CoV-2 infection  and should not be used as the sole basis for treatment or other  patient management decisions.  A negative result may occur with  improper specimen collection / handling, submission of specimen other  than nasopharyngeal swab, presence of viral mutation(s) within the  areas targeted by this assay, and inadequate number of viral copies  (<250 copies / mL). A negative result must be combined with clinical  observations, patient history, and epidemiological information. If result is POSITIVE SARS-CoV-2 target nucleic acids are DETECTED.  The SARS-CoV-2 RNA is generally detectable in upper and lower  respiratory specimens during the acute phase of infection.  Positive  results are indicative of active infection with SARS-CoV-2.  Clinical  correlation with patient history and other diagnostic information is  necessary to determine  patient infection status.  Positive results do  not rule out bacterial infection or co-infection with other viruses. If result is PRESUMPTIVE POSTIVE SARS-CoV-2 nucleic acids MAY BE PRESENT.   A presumptive positive result was obtained on the submitted specimen  and confirmed on repeat testing.  While 2019 novel coronavirus  (SARS-CoV-2) nucleic acids may be present in the submitted sample  additional confirmatory testing may be necessary for epidemiological  and / or clinical management purposes  to differentiate between  SARS-CoV-2 and other Sarbecovirus currently known to infect humans.  If clinically indicated additional testing with an alternate test  methodology [phone])  is advised. The SARS-CoV-2 RNA is generally  detectable in upper and lower  respiratory specimens during the acute  phase of infection. The expected result is Negative. Fact Sheet for Patients:  StrictlyIdeas.no Fact Sheet for Healthcare Providers: BankingDealers.co.za This test is not yet approved or cleared by the [place] FDA and has been authorized for detection and/or diagnosis of SARS-CoV-2 by FDA under an Emergency Use Authorization (EUA).  This EUA will remain in effect (meaning this test can be used) for the duration of the COVID-19 declaration under Section 564(b)(1) of the Act, 21 U.S.C. section 360bbb-3(b)(1), unless the authorization is terminated or revoked sooner. Performed at [facility], [address], [place], [place] [address]   Blood Culture (routine x 2)     Status: None   Collection Time: 03/02/19 11:37 AM   Specimen: BLOOD LEFT ARM  Result Value Ref Range Status   Specimen Description BLOOD LEFT ARM  Final   Special Requests   Final    BOTTLES DRAWN AEROBIC AND ANAEROBIC Blood Culture adequate volume   Culture   Final    NO GROWTH 5 DAYS Performed at [facility], [address] [place], [place] [address]    Report Status  03/07/2019 FINAL  Final  Blood Culture (routine x 2)     Status: None   Collection Time: 03/02/19 11:37 AM   Specimen: BLOOD LEFT ARM  Result Value Ref Range Status   Specimen Description BLOOD LEFT ARM  Final   Special Requests   Final    BOTTLES DRAWN AEROBIC AND ANAEROBIC Blood Culture adequate volume   Culture   Final    NO GROWTH 5 DAYS Performed at [facility], [address] [place], [place] [address]    Report Status 03/07/2019 FINAL  Final    Radiology Reports Dg Pelvis Portable  Result Date: 03/02/2019 CLINICAL DATA:  Right hip pain.  No injury. EXAM: PORTABLE PELVIS 1-2 VIEWS COMPARISON:  CT abdomen pelvis dated February 06, 2019. FINDINGS: No acute fracture or dislocation. Prior left proximal femur ORIF. Unchanged mild bilateral hip joint space narrowing with small marginal osteophytes. The pubic symphysis and sacroiliac joints are unremarkable. Lower lumbar spondylosis. Bone mineralization is normal. Soft tissues are unremarkable. IMPRESSION: 1.  No acute osseous abnormality. 2. Unchanged mild bilateral hip degenerative changes. Electronically Signed   By: Titus Dubin M.D.   On: 03/02/2019 11:27   Dg Chest Port 1 View  Result Date: 03/04/2019 CLINICAL DATA:  COVID-19 pneumonia. EXAM: PORTABLE CHEST 1 VIEW COMPARISON:  03/03/2019 FINDINGS: Lordotic technique demonstrated. Lungs are adequately inflated with slight overall worsening bilateral patchy airspace process left lung worse than right. No definite effusion. Cardiomediastinal silhouette and remainder of the exam is unchanged. IMPRESSION: Slight interval worsening of diffuse bilateral patchy airspace process compatible with patient's known COVID-19 pneumonia. Electronically Signed   By: Marin Olp M.D.   On: 03/04/2019 09:00   Dg Chest Port 1 View  Result Date: 03/03/2019 CLINICAL DATA:  Shortness of breath. EXAM: PORTABLE CHEST 1 VIEW COMPARISON:  03/02/2019 FINDINGS: Low lung volumes with interstitial and airspace  opacities unchanged compared to prior study. Worse in the peripheral left chest. Cardiomediastinal contours are stable. No signs of acute bone finding with evidence of prior cervicothoracic spinal fusion, partially imaged. IMPRESSION: Findings remain concerning for viral or atypical pneumonia. Asymmetric edema could have a similar appearance. Electronically Signed   By: Zetta Bills M.D.   On: 03/03/2019 11:43   Dg Chest Port 1 View  Result Date: 03/02/2019 CLINICAL DATA:  Severe shortness of breath. EXAM: PORTABLE CHEST 1  VIEW COMPARISON:  Chest x-ray dated February 06, 2019. FINDINGS: The heart size and mediastinal contours are within normal limits. Normal pulmonary vascularity. Patchy asymmetric peripheral opacities in the left greater than right mid to lower lungs. No pleural effusion or pneumothorax. Unchanged elevation of the right hemidiaphragm. No acute osseous abnormality. IMPRESSION: 1. There are findings in the lungs which are nonspecific, but concerning for atypical infection, including potential viral pneumonia. Electronically Signed   By: Titus Dubin M.D.   On: 03/02/2019 11:25   Ct Liver Abdomen W Wo Contrast  Result Date: 03/08/2019 CLINICAL DATA:  Inpatient. Percutaneous drainage of reported liver abscess at outside facility on 02/13/2019. Currently hospitalized with COVID-19 pneumonia. EXAM: CT ABDOMEN WITHOUT AND WITH CONTRAST TECHNIQUE: Multidetector CT imaging of the abdomen was performed following the standard protocol before and following the bolus administration of intravenous contrast. CONTRAST:  18mL OMNIPAQUE IOHEXOL 350 MG/ML SOLN COMPARISON:  02/06/2019 CT abdomen/pelvis. FINDINGS: Lower chest: Extensive patchy peripheral ground-glass opacity and bandlike consolidation throughout both lung bases, new since 02/06/2019. Coronary atherosclerosis. Hepatobiliary: Normal liver size. Hypodense 2.3 x 1.8 cm segment 7 right liver lesion (series 8/image 23) with percutaneous drain  at inferior margin, decreased from 5.2 x 4.7 cm on 02/06/2019 CT, without appreciable solid enhancement or wall thickening. Subcentimeter hypodense extreme right liver dome lesion (series 8/image 5), too small to characterize, not definitely seen on prior noncontrast CT. Simple extreme inferior right liver lobe 1.3 cm cyst. No additional liver lesions. Cholecystectomy. No biliary ductal dilatation. Pancreas: Normal, with no mass or duct dilation. Spleen: Normal size. No mass. Adrenals/Urinary Tract: Normal adrenals. No renal stones. No hydronephrosis. Small simple left renal cysts, largest 2.3 cm in the posterior upper left kidney. Stomach/Bowel: Normal non-distended stomach. Visualized small and large bowel is normal caliber, with no bowel wall thickening. Vascular/Lymphatic: Atherosclerotic abdominal aorta with stable 3.2 cm infrarenal abdominal aortic aneurysm using similar measurement technique. Patent portal, splenic, hepatic and renal veins. No pathologically enlarged lymph nodes in the abdomen. Other: No pneumoperitoneum, ascites or focal fluid collection. Musculoskeletal: No aggressive appearing focal osseous lesions. Marked thoracolumbar spondylosis. IMPRESSION: 1. Percutaneous drain terminates at the inferior margin of a low-attenuation 2.3 x 1.8 cm segment 7 right liver lesion, which has significantly decreased in size from 5.2 x 4.7 cm on 02/06/2019 CT, and which demonstrates no solid enhancement or wall thickening. Findings favor decreased size of a complex liver cyst (which has been present on CT studies back to 2014). 2. Extensive patchy peripheral ground-glass opacity and bandlike consolidation at the lung bases, new since 02/06/2019, compatible with COVID-19 pneumonia. 3. Infrarenal 3.2 cm Abdominal Aortic Aneurysm (ICD10-I71.9). Recommend follow-up aortic ultrasound in 3 years. This recommendation follows ACR consensus guidelines: White Paper of the ACR Incidental Findings Committee II on Vascular  Findings. J Am Coll Radiol 2013JB:[id number]. 4.  Aortic Atherosclerosis (ICD10-I70.0). Electronically Signed   By: Ilona Sorrel M.D.   On: 03/08/2019 14:14   Vas [place] Lower Extremity Venous (dvt)  Result Date: 03/05/2019  Lower Venous Study Other Indications: Covid Positive, elevated d-diimer. Comparison Study: No priors Performing Technologist: Velva Harman Sturdivant RDMS, RVT  Examination Guidelines: A complete evaluation includes B-mode imaging, spectral Doppler, color Doppler, and power Doppler as needed of all accessible portions of each vessel. Bilateral testing is considered an integral part of a complete examination. Limited examinations for reoccurring indications may be performed as noted.  +---------+---------------+---------+-----------+----------+--------------+  RIGHT     Compressibility Phasicity Spontaneity Properties Thrombus Aging  +---------+---------------+---------+-----------+----------+--------------+  CFV  Full            Yes       Yes                                    +---------+---------------+---------+-----------+----------+--------------+  SFJ       Full                                                             +---------+---------------+---------+-----------+----------+--------------+  FV Prox   Full                                                             +---------+---------------+---------+-----------+----------+--------------+  FV Mid    Full                                                             +---------+---------------+---------+-----------+----------+--------------+  FV Distal Full                                                             +---------+---------------+---------+-----------+----------+--------------+  PFV       Full                                                             +---------+---------------+---------+-----------+----------+--------------+  POP       Full            Yes       Yes                                     +---------+---------------+---------+-----------+----------+--------------+  PTV       Full                                                             +---------+---------------+---------+-----------+----------+--------------+  PERO      Full                                                             +---------+---------------+---------+-----------+----------+--------------+   +---------+---------------+---------+-----------+----------+--------------+  LEFT      Compressibility Phasicity Spontaneity Properties Thrombus Aging  +---------+---------------+---------+-----------+----------+--------------+  CFV       Full            Yes       Yes                                    +---------+---------------+---------+-----------+----------+--------------+  SFJ       Full                                                             +---------+---------------+---------+-----------+----------+--------------+  FV Prox   Full                                                             +---------+---------------+---------+-----------+----------+--------------+  FV Mid    Full                                                             +---------+---------------+---------+-----------+----------+--------------+  FV Distal Full                                                             +---------+---------------+---------+-----------+----------+--------------+  PFV       Full                                                             +---------+---------------+---------+-----------+----------+--------------+  POP       Full            Yes       Yes                                    +---------+---------------+---------+-----------+----------+--------------+  PTV       Full                                                             +---------+---------------+---------+-----------+----------+--------------+  PERO      Full                                                              +---------+---------------+---------+-----------+----------+--------------+  Summary: Right: There is no evidence of deep vein thrombosis in the lower extremity. No cystic structure found in the popliteal fossa. Left: There is no evidence of deep vein thrombosis in the lower extremity. No cystic structure found in the popliteal fossa.  *See table(s) above for measurements and observations. Electronically signed by Curt Jews MD on 03/05/2019 at 7:06:10 AM.    Final

## 2019-03-10 NOTE — TOC Transition Note (Signed)
Transition of Care [facility]) - CM/SW Discharge Note   Patient Details  Name: RENIER CHADDOCK MRN: [id number] Date of Birth: 1945-05-09  [facility]) CM/SW Contact:  Ninfa Meeker, RN Phone Number: [phone] (working remotely) 03/10/2019, 1:28 PM   Clinical Narrative:   Case manager has spoken with patient's son Anakin Nolette., we reviewed [place] and DME needs. Explained that [employer] will deliver concentrator and tanks to the home either this afternoon or first thing in the morning. Patient will be going to his son's home and CM confirmed the address: [address] [place]. Contact number is [phone]. CM contacted [facility] and requested a tank, RW and 3in1 be taken to patient's room for tomorrow's discharge.  Daiki is very appreciative of care his dad is receiving and for case manager assistance. He will be transporting his dad home tomorrow.    Final next level of care: Home w Home Health Services Barriers to Discharge: No Barriers Identified   Patient Goals and CMS Choice Patient states their goals for this hospitalization and ongoing recovery are:: to continue to get better per son CMS Medicare.gov Compare Post Acute Care list provided to:: Patient Choice offered to / list presented to : Patient  Discharge Placement                       Discharge Plan and Services   Discharge Planning Services: CM Consult Post Acute Care Choice: Durable Medical Equipment, Home Health          DME Arranged: 3-N-1, Walker rolling, Oxygen DME Agency: [place] Date DME Agency Contacted: 03/10/19 Time DME Agency Contacted: [id number] Representative spoke with at DME Agency: Learta Codding HH Arranged: PT, OT [facility] Agency: [place] Date [place]: 03/07/19 Time [place]: [place] Representative spoke with at [place]: [place] ([place]) Interventions     Readmission Risk Interventions No  flowsheet data found.

## 2019-03-10 NOTE — Progress Notes (Signed)
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 Working on ADL and functional mobility during ADL on 3-4 LO2 with SpO2 high 70s-low 80s. Eventual rebound to high 80s. Pt working very hard on exercises and wants to be able to DC home and not go to SNF. At this time, pt would need 24/7 S for safe DC home. Encouraged pt to work on breathing and stregthening exercises independently. Face Timed son at end of session. Pt and family very appreciative.  Son expressing concerns over his Dad's paranoid behavior and cognitive changes @ "8 days before being admitted for Covid". Will continue to follow acutely. Encourage OOB for all meals and mobility with nursing @ RW level.     03/09/19 1800  OT Visit Information  Last OT Received On 03/09/19  Assistance Needed +1  History of Present Illness 73 y/o male w/ hx of renal disorder, persistent a -fib, HTn, hypothyroidism, gout, kidney stones, GERD, femur fx, dyrhythmia, CAD, BPH, arthritis, R TKA 2015, cervical spine surgery, TEE w/o cardioversion, diagnosed as Covid positive at [facility] 10/26 at which time a chest x-ray noted opacities bilaterally, able to go home, re-evaluated on 11/01 w/ worsening SOB again able to go home, presented to ED 11/04 and was found to be hypoxic hence admitted to [facility].  Precautions  Precautions Fall  Precaution Comments watch sats with activity  Pain Assessment  Pain Assessment No/denies pain  Cognition  Arousal/Alertness Awake/alert  Behavior During Therapy WFL for tasks assessed/performed  Overall Cognitive Status No family/caregiver present to determine baseline cognitive functioning  General Comments Spoke with son and he was concerned about his Dad's cognition prior to admission regarding cognitive and behavioral changes. States he was very paramoid, which is unusual for him.    Upper Extremity Assessment  Upper Extremity Assessment Generalized weakness  Lower Extremity Assessment  Lower Extremity Assessment Generalized weakness  ADL  Overall ADL's  Needs assistance/impaired  Eating/Feeding Set up  Grooming Set up;Sitting  Upper Body Bathing Minimal assistance;Sitting  Lower Body Bathing Minimal assistance;Sit to/from stand  Upper Body Dressing  Set up;Sitting  Lower Body Dressing Sit to/from stand;Moderate assistance  Toilet Transfer RW;Min guard;BSC  Toileting- Water quality scientist and Hygiene Min guard;Sitting/lateral lean  Functional mobility during ADLs Min guard;Rolling walker;Cueing for safety (BSC - bed; not ambulation)  General ADL Comments Educated on pursed lip breathing during ADL; desat into low 80s on 3L; increased to 4L  Bed Mobility  Overal bed mobility Needs Assistance  Bed Mobility Supine to Sit  Supine to sit Supervision;HOB elevated  Balance  Overall balance assessment Needs assistance  Sitting balance-Leahy Scale Good  Standing balance-Leahy Scale Poor  Standing balance comment reliant on external support  Transfers  Overall transfer level Needs assistance  Equipment used Rolling walker (2 wheeled)  Transfers Sit to/from Stand;Stand Pivot Transfers  Sit to Stand Min guard  Stand pivot transfers Min guard;From elevated surface  General Comments  General comments (skin integrity, edema, etc.) Pt very tearful on entry. 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 Listening and encouragemetn provided  General Exercises - Upper Extremity  Shoulder Horizontal ABduction Strengthening;Left;15 reps;Theraband  Theraband Level (Shoulder Horizontal Abduction) Level 2 (Red)  Elbow Flexion Strengthening;Both;15 reps;Seated;Theraband  Theraband  Level (Elbow Flexion) Level 2 (Red)  Elbow Extension Strengthening;Both;15  reps;Seated;Theraband  Theraband Level (Elbow Extension) Level 2 (Red)  General Exercises - Lower Extremity  Hip Flexion/Marching Strengthening;20 reps;Seated  Other Exercises  Other Exercises flutter valve x 10  Other Exercises incentive spirometer x 10  Other Exercises sit - stnad x 3  Other Exercises deep breathing exercises  OT - End of Session  Equipment Utilized During Treatment Gait belt;Rolling walker;Oxygen (3-4L)  Activity Tolerance Patient tolerated treatment well  Patient left in chair;with call bell/phone within reach  Nurse Communication Mobility status  OT Assessment/Plan  OT Plan Discharge plan remains appropriate  OT Visit Diagnosis Unsteadiness on feet (R26.81);Other abnormalities of gait and mobility (R26.89);Muscle weakness (generalized) (M62.81);Other (comment)  OT Frequency (ACUTE ONLY) Min 2X/week  Follow Up Recommendations SNF;Home health OT;Other (comment)  OT Equipment 3 in 1 bedside commode  AM-PAC OT "6 Clicks" Daily Activity Outcome Measure (Version 2)  Help from another person eating meals? 4  Help from another person taking care of personal grooming? 3  Help from another person toileting, which includes using toliet, bedpan, or urinal? 3  Help from another person bathing (including washing, rinsing, drying)? 3  Help from another person to put on and taking off regular upper body clothing? 3  Help from another person to put on and taking off regular lower body clothing? 2  6 Click Score 18  OT Goal Progression  Progress towards OT goals Progressing toward goals  Acute Rehab OT Goals  Patient Stated Goal wants to go home and work on his own rehabilitation, does not wish to go to SNF  OT Goal Formulation With patient  Time For Goal Achievement 03/19/19  Potential to Achieve Goals Good  ADL Goals  Pt Will Perform Grooming with modified independence;standing  Pt Will Transfer to Toilet ambulating;regular height toilet;with min guard assist  Pt Will  Perform Tub/Shower Transfer with min guard assist;ambulating;shower seat;tub bench;rolling walker  Pt/caregiver will Perform Home Exercise Program Increased strength;Both right and left upper extremity;With theraband;With written HEP provided  Additional ADL Goal #1 Pt will recall and apply ECS strategies for increased tolerance and independence of BADL  Additional ADL Goal #2 Pt will self monitor and maintain O2 sats above 90% with BADL routine  OT Time Calculation  OT Start Time (ACUTE ONLY) 1545  OT Stop Time (ACUTE ONLY) 1638  OT Time Calculation (min) 53 min  Maurie Boettcher, OT/L   Acute OT Clinical Specialist [place] Pager [phone] Office [phone]

## 2019-03-10 NOTE — Care Management Important Message (Signed)
Important Message  Patient Details  Name: Wayne Ortiz MRN: [id number] Date of Birth: [birth year]   Medicare Important Message Given:  Yes - Important Message mailed due to current National Emergency  Verbal consent obtained due to current National Emergency  Relationship to patient: Child Contact Name: Cogan Vanzant Call Date: 03/10/19  Time: 1509 Phone: [phone] Outcome: Spoke with contact Important Message mailed to: Emergency contact on file    [place] 03/10/2019, 3:09 PM

## 2019-03-10 NOTE — Progress Notes (Signed)
Physical Therapy Treatment Patient Details Name: Wayne Ortiz MRN: [id number] DOB: 04/08/1946 Today's Date: 03/10/2019    History of Present Illness 73 y/o male w/ hx of renal disorder, persistent a -fib, HTn, hypothyroidism, gout, kidney stones, GERD, femur fx, dyrhythmia, CAD, BPH, arthritis, R TKA 2015, cervical spine surgery, TEE w/o cardioversion, diagnosed as Covid positive at [facility] 10/26 at which time a chest x-ray noted opacities bilaterally, able to go home, re-evaluated on 11/01 w/ worsening SOB again able to go home, presented to ED 11/04 and was found to be hypoxic hence admitted to [facility].    PT Comments    ambulates approx max 44ft with 4WW and min guard assist, then get very anxious and requests to it down. on initial trial sats dropped to high 70s but w/ calming tones and being assured sats are not as low as he thinks, does better with pursed lip breathing and is able to regain saturation back to high 80s and low 90s. ambulated further approx 10 ft with same results, this time placed pt on 2L/min via [place] and sats decreased to low 80s w/ seated rest and pursed lip breathing able to recover to high 80s, able to ambulate approx 48ft again prior to needing to sit down. Needs continued and strong encouragement to recover and regain saturation to high 80 and 90s. Pt kept on 2L/min after ambulation, was on room air and sats in high 80s prior to ambulation.   Follow Up Recommendations  Home health PT     Equipment Recommendations       Recommendations for Other Services       Precautions / Restrictions Precautions Precautions: Fall Precaution Comments: extreme apprehension with ambulation and desaturation Restrictions Weight Bearing Restrictions: No    Mobility  Bed Mobility               General bed mobility comments: pt received sitting in recliner  Transfers Overall transfer level: Needs assistance Equipment used: 4-wheeled walker Transfers: Sit to/from Stand;Stand  Pivot Transfers Sit to Stand: Min guard Stand pivot transfers: Min guard          Ambulation/Gait Ambulation/Gait assistance: Min guard Gait Distance (Feet): 25 Feet Assistive device: 4-wheeled walker Gait Pattern/deviations: Wide base of support;Staggering right;Staggering left Gait velocity: slow   General Gait Details: ambulates approx max 40ft with 4WW and min guard assist, then get very anxious and requests to it down. on initial trial sats dropped to high 70s but w/ calming tones and being assured sats are not as low as he thinks, does better with pursed lip breathing and is able to regain saturation back to high 80s and low 90s. ambulated further approx 10 ft with same results, this time placed pt on 2L/min via [place] and sats decreased to low 80s w/ seated rest and pursed lip breathing able to recover to high 80s, able to ambulate approx 40ft again prior to needing to sit down.   Stairs             Wheelchair Mobility    Modified Rankin (Stroke Patients Only)       Balance Overall balance assessment: Needs assistance Sitting-balance support: Feet supported Sitting balance-Leahy Scale: Good     Standing balance support: Bilateral upper extremity supported;During functional activity Standing balance-Leahy Scale: Fair                              Cognition Arousal/Alertness: Awake/alert Behavior During  Therapy: WFL for tasks assessed/performed                                   General Comments: continues to be extremely apprehensive about any mobility and 02 desaturation      Exercises Other Exercises Other Exercises: flutter valve x 10 Other Exercises: incentive spirometer x 5    General Comments General comments (skin integrity, edema, etc.): worked on ambulation on hall and saturation monitoring, pt able to ambulate a ma 75ft at a time prior to becoming over anxious and needing seated rest break, noted with this he had 02  desaturation to low 80s and high 70s. Needs continued and strong encouragement to recover and regain saturation to high 80 and 90s. Pt kept on 2L/min after ambulation, was on room air and sats in high 80s prior to ambulation.      Pertinent Vitals/Pain Pain Assessment: No/denies pain Pain Intervention(s): Limited activity within patient's tolerance    Home Living                      Prior Function            PT Goals (current goals can now be found in the care plan section) Acute Rehab PT Goals PT Goal Formulation: With patient Time For Goal Achievement: 03/18/19 Potential to Achieve Goals: Fair Progress towards PT goals: Progressing toward goals    Frequency    Min 3X/week      PT Plan Current plan remains appropriate    Co-evaluation              AM-PAC PT "6 Clicks" Mobility   Outcome Measure  Help needed turning from your back to your side while in a flat bed without using bedrails?: A Lot Help needed moving from lying on your back to sitting on the side of a flat bed without using bedrails?: A Lot Help needed moving to and from a bed to a chair (including a wheelchair)?: A Little Help needed standing up from a chair using your arms (e.g., wheelchair or bedside chair)?: A Little Help needed to walk in hospital room?: A Little Help needed climbing 3-5 steps with a railing? : A Lot 6 Click Score: 15    End of Session Equipment Utilized During Treatment: Oxygen Activity Tolerance: Treatment limited secondary to medical complications (Comment) Patient left: in chair;with call bell/phone within reach Nurse Communication: Mobility status;Precautions;Other (comment) PT Visit Diagnosis: Other abnormalities of gait and mobility (R26.89);Muscle weakness (generalized) (M62.81)     Time: 1310-1401 PT Time Calculation (min) (ACUTE ONLY): 51 min  Charges:  $Gait Training: 8-22 mins $Therapeutic Activity: 8-22 mins $Self Care/Home Management: [place], PT    Delford Field 03/10/2019, 4:00 PM

## 2019-03-10 NOTE — Progress Notes (Addendum)
[id number]: Assumed care of Pt from night RN. Pt alert, denies pain, VS WNL, SpO2 93% on 2L [place]. Assisted to standing position for voiding then back to chair. Peri care complete, Pt states he feels great today and ready to go home, call bell within reach.  1200: Pt in good spirits, comfortable up to chair, eating lunch. Call bell within reach  1600: reassessed, VS WNL. Assisted pt to standing, voiding, then ambulating around room per Pt request. Comfortable back to chair, reminded to call for assistance.   1830: Remains comfortable in the chair and wants to stayn . Call bell within reach

## 2019-03-11 LAB — COMPREHENSIVE METABOLIC PANEL
ALT: 39 U/L (ref 0–44)
AST: 33 U/L (ref 15–41)
Albumin: 2.6 g/dL — ABNORMAL LOW (ref 3.5–5.0)
Alkaline Phosphatase: 53 U/L (ref 38–126)
Anion gap: 8 (ref 5–15)
BUN: 30 mg/dL — ABNORMAL HIGH (ref 8–23)
CO2: 24 mmol/L (ref 22–32)
Calcium: 8 mg/dL — ABNORMAL LOW (ref 8.9–10.3)
Chloride: 102 mmol/L (ref 98–111)
Creatinine, Ser: 0.96 mg/dL (ref 0.61–1.24)
GFR calc Af Amer: >60 mL/min (ref >60)
GFR calc non Af Amer: >60 mL/min (ref >60)
Glucose, Bld: 132 mg/dL — ABNORMAL HIGH (ref 70–99)
Potassium: 5.1 mmol/L (ref 3.5–5.1)
Sodium: 134 mmol/L — ABNORMAL LOW (ref 135–145)
Total Bilirubin: 0.8 mg/dL (ref 0.3–1.2)
Total Protein: 5.9 g/dL — ABNORMAL LOW (ref 6.5–8.1)

## 2019-03-11 LAB — CBC WITH DIFFERENTIAL/PLATELET
Abs Immature Granulocytes: 0.18 10*3/uL — ABNORMAL HIGH (ref 0.00–0.07)
Basophils Absolute: 0 10*3/uL (ref 0.0–0.1)
Basophils Relative: 0 %
Eosinophils Absolute: 0 10*3/uL (ref 0.0–0.5)
Eosinophils Relative: 0 %
HCT: 45.5 % (ref 39.0–52.0)
Hemoglobin: 15 g/dL (ref 13.0–17.0)
Immature Granulocytes: 1 %
Lymphocytes Relative: 3 %
Lymphs Abs: 0.4 10*3/uL — ABNORMAL LOW (ref 0.7–4.0)
MCH: 32.1 pg (ref 26.0–34.0)
MCHC: 33 g/dL (ref 30.0–36.0)
MCV: 97.4 fL (ref 80.0–100.0)
Monocytes Absolute: 0.6 10*3/uL (ref 0.1–1.0)
Monocytes Relative: 4 %
Neutro Abs: 12.2 10*3/uL — ABNORMAL HIGH (ref 1.7–7.7)
Neutrophils Relative %: 92 %
Platelets: 87 10*3/uL — ABNORMAL LOW (ref 150–400)
RBC: 4.67 MIL/uL (ref 4.22–5.81)
RDW: 15.1 % (ref 11.5–15.5)
WBC: 13.3 10*3/uL — ABNORMAL HIGH (ref 4.0–10.5)
nRBC: 0.2 % (ref 0.0–0.2)

## 2019-03-11 LAB — C-REACTIVE PROTEIN: CRP: <0.8 mg/dL (ref <1.0)

## 2019-03-11 LAB — MAGNESIUM: Magnesium: 2.2 mg/dL (ref 1.7–2.4)

## 2019-03-11 LAB — D-DIMER, QUANTITATIVE: D-Dimer, Quant: 2.77 ug/mL-FEU — ABNORMAL HIGH (ref 0.00–0.50)

## 2019-03-11 MED ORDER — METHYLPREDNISOLONE 4 MG PO TBPK: ORAL_TABLET | ORAL | 0 refills | Status: DC

## 2019-03-11 MED ORDER — DILTIAZEM HCL ER COATED BEADS 120 MG PO CP24: 120.0000 mg | ORAL_CAPSULE | Freq: Every day | ORAL | 0 refills | Status: DC

## 2019-03-11 MED ORDER — DEXAMETHASONE SODIUM PHOSPHATE 4 MG/ML IJ SOLN
2.0000 mg | INTRAMUSCULAR | Status: DC
  Administered 2019-03-11: 2 mg via INTRAVENOUS
  Filled 2019-03-11: qty 1

## 2019-03-11 MED ORDER — SYNTHROID 137 MCG PO TABS: 137.0000 ug | ORAL_TABLET | Freq: Every day | ORAL | Status: DC

## 2019-03-11 MED ORDER — METRONIDAZOLE 500 MG PO TABS: 500.0000 mg | ORAL_TABLET | Freq: Three times a day (TID) | ORAL | 0 refills | Status: AC

## 2019-03-11 MED ORDER — SODIUM POLYSTYRENE SULFONATE 15 GM/60ML PO SUSP
15.0000 g | Freq: Once | ORAL | Status: AC
  Administered 2019-03-11: 15 g via ORAL
  Filled 2019-03-11: qty 60

## 2019-03-11 MED ORDER — CIPROFLOXACIN HCL 500 MG PO TABS: 500.0000 mg | ORAL_TABLET | Freq: Two times a day (BID) | ORAL | 0 refills | Status: DC

## 2019-03-11 NOTE — Progress Notes (Addendum)
0745: Assumed care of Pt from night RN. Pt alert, denies pain, VS WNL, SpO2 92% on 2L [place]. Assisted to standing position for voiding then back to bed. Peri care complete, Pt hopeful to go home today, call bell within reach.  1100: Extensive discharge education given to patient regarding O2, BSC, activity, etc. Pt taught back and confirmed understanding information. PIV removed, assisted Pt to getting dressed, Awaiting on arrival of son for transport home.

## 2019-03-11 NOTE — Discharge Instructions (Signed)
Take a copy of my Discharge summary with you, please have your RN print it for you.  Follow with Primary MD Celene Squibb, MD and your Infectious disease MD at [facility] in 7 days with a COPY of your Discharge Summary  Get CBC, CMP, 2 view Chest X ray -  checked next visit within 1 week by Primary MD    Activity: As tolerated with Full fall precautions use walker/cane & assistance as needed  Disposition Home   Diet: Heart Healthy    For Heart failure patients - Check your Weight same time everyday, if you gain over 2 pounds, or you develop in leg swelling, experience more shortness of breath or chest pain, call your Primary MD immediately. Follow Cardiac Low Salt Diet and 1.5 lit/day fluid restriction.  Special Instructions: If you have smoked or chewed Tobacco  in the last 2 yrs please stop smoking, stop any regular Alcohol  and or any Recreational drug use.  On your next visit with your primary care physician please Get Medicines reviewed and adjusted.  Please request your Prim.MD to go over all Hospital Tests and Procedure/Radiological results at the follow up, please get all Hospital records sent to your Prim MD by signing hospital release before you go home.  If you experience worsening of your admission symptoms, develop shortness of breath, life threatening emergency, suicidal or homicidal thoughts you must seek medical attention immediately by calling 911 or calling your MD immediately  if symptoms less severe.  You Must read complete instructions/literature along with all the possible adverse reactions/side effects for all the Medicines you take and that have been prescribed to you. Take any new Medicines after you have completely understood and accpet all the possible adverse reactions/side effects.      Person Under Monitoring Name: Wayne Ortiz  Location: [place] [address]   Infection Prevention Recommendations for Individuals Confirmed to have, or Being  Evaluated for, 2019 Novel Coronavirus (COVID-19) Infection Who Receive Care at Home  Individuals who are confirmed to have, or are being evaluated for, COVID-19 should follow the prevention steps below until a healthcare provider or local or state health department says they can return to normal activities.  Stay home except to get medical care You should restrict activities outside your home, except for getting medical care. Do not go to work, school, or public areas, and do not use public transportation or taxis.  Call ahead before visiting your doctor Before your medical appointment, call the healthcare provider and tell them that you have, or are being evaluated for, COVID-19 infection. This will help the healthcare providers office take steps to keep other people from getting infected. Ask your healthcare provider to call the local or state health department.  Monitor your symptoms Seek prompt medical attention if your illness is worsening (e.g., difficulty breathing). Before going to your medical appointment, call the healthcare provider and tell them that you have, or are being evaluated for, COVID-19 infection. Ask your healthcare provider to call the local or state health department.  Wear a facemask You should wear a facemask that covers your nose and mouth when you are in the same room with other people and when you visit a healthcare provider. People who live with or visit you should also wear a facemask while they are in the same room with you.  Separate yourself from other people in your home As much as possible, you should stay in a different room  from other people in your home. Also, you should use a separate bathroom, if available.  Avoid sharing household items You should not share dishes, drinking glasses, cups, eating utensils, towels, bedding, or other items with other people in your home. After using these items, you should wash them thoroughly with soap and  water.  Cover your coughs and sneezes Cover your mouth and nose with a tissue when you cough or sneeze, or you can cough or sneeze into your sleeve. Throw used tissues in a lined trash can, and immediately wash your hands with soap and water for at least 20 seconds or use an alcohol-based hand rub.  Wash your [facility] your hands often and thoroughly with soap and water for at least 20 seconds. You can use an alcohol-based hand sanitizer if soap and water are not available and if your hands are not visibly dirty. Avoid touching your eyes, nose, and mouth with unwashed hands.   Prevention Steps for Caregivers and Household Members of Individuals Confirmed to have, or Being Evaluated for, COVID-19 Infection Being Cared for in the Home  If you live with, or provide care at home for, a person confirmed to have, or being evaluated for, COVID-19 infection please follow these guidelines to prevent infection:  Follow healthcare providers instructions Make sure that you understand and can help the patient follow any healthcare provider instructions for all care.  Provide for the patients basic needs You should help the patient with basic needs in the home and provide support for getting groceries, prescriptions, and other personal needs.  Monitor the patients symptoms If they are getting sicker, call his or her medical provider and tell them that the patient has, or is being evaluated for, COVID-19 infection. This will help the healthcare providers office take steps to keep other people from getting infected. Ask the healthcare provider to call the local or state health department.  Limit the number of people who have contact with the patient  If possible, have only one caregiver for the patient.  Other household members should stay in another home or place of residence. If this is not possible, they should stay  in another room, or be separated from the patient as much as possible.  Use a separate bathroom, if available.  Restrict visitors who do not have an essential need to be in the home.  Keep older adults, very young children, and other sick people away from the patient Keep older adults, very young children, and those who have compromised immune systems or chronic health conditions away from the patient. This includes people with chronic heart, lung, or kidney conditions, diabetes, and cancer.  Ensure good ventilation Make sure that shared spaces in the home have good air flow, such as from an air conditioner or an opened window, weather permitting.  Wash your hands often  Wash your hands often and thoroughly with soap and water for at least 20 seconds. You can use an alcohol based hand sanitizer if soap and water are not available and if your hands are not visibly dirty.  Avoid touching your eyes, nose, and mouth with unwashed hands.  Use disposable paper towels to dry your hands. If not available, use dedicated cloth towels and replace them when they become wet.  Wear a facemask and gloves  Wear a disposable facemask at all times in the room and gloves when you touch or have contact with the patients blood, body fluids, and/or secretions or excretions, such as sweat, saliva,  sputum, nasal mucus, vomit, urine, or feces.  Ensure the mask fits over your nose and mouth tightly, and do not touch it during use.  Throw out disposable facemasks and gloves after using them. Do not reuse.  Wash your hands immediately after removing your facemask and gloves.  If your personal clothing becomes contaminated, carefully remove clothing and launder. Wash your hands after handling contaminated clothing.  Place all used disposable facemasks, gloves, and other waste in a lined container before disposing them with other household waste.  Remove gloves and wash your hands immediately after handling these items.  Do not share dishes, glasses, or other household items with  the patient  Avoid sharing household items. You should not share dishes, drinking glasses, cups, eating utensils, towels, bedding, or other items with a patient who is confirmed to have, or being evaluated for, COVID-19 infection.  After the person uses these items, you should wash them thoroughly with soap and water.  Wash laundry thoroughly  Immediately remove and wash clothes or bedding that have blood, body fluids, and/or secretions or excretions, such as sweat, saliva, sputum, nasal mucus, vomit, urine, or feces, on them.  Wear gloves when handling laundry from the patient.  Read and follow directions on labels of laundry or clothing items and detergent. In general, wash and dry with the warmest temperatures recommended on the label.  Clean all areas the individual has used often  Clean all touchable surfaces, such as counters, tabletops, doorknobs, bathroom fixtures, toilets, phones, keyboards, tablets, and bedside tables, every day. Also, clean any surfaces that may have blood, body fluids, and/or secretions or excretions on them.  Wear gloves when cleaning surfaces the patient has come in contact with.  Use a diluted bleach solution (e.g., dilute bleach with 1 part bleach and 10 parts water) or a household disinfectant with a label that says EPA-registered for coronaviruses. To make a bleach solution at home, add 1 tablespoon of bleach to 1 quart (4 cups) of water. For a larger supply, add  cup of bleach to 1 gallon (16 cups) of water.  Read labels of cleaning products and follow recommendations provided on product labels. Labels contain instructions for safe and effective use of the cleaning product including precautions you should take when applying the product, such as wearing gloves or eye protection and making sure you have good ventilation during use of the product.  Remove gloves and wash hands immediately after cleaning.  Monitor yourself for signs and symptoms of  illness Caregivers and household members are considered close contacts, should monitor their health, and will be asked to limit movement outside of the home to the extent possible. Follow the monitoring steps for close contacts listed on the symptom monitoring form.   ? If you have additional questions, contact your local health department or call the epidemiologist on call at [phone] (available 24/7). ? This guidance is subject to change. For the most up-to-date guidance from [facility], please refer to their website: YouBlogs[employer]pl

## 2019-03-11 NOTE — Discharge Summary (Signed)
Wayne Ortiz [id number] DOB: [birth year] DOA: 03/02/2019  PCP: Celene Squibb, MD  Admit date: 03/02/2019  Discharge date: 03/11/2019  Admitted From: Home   Disposition:  Home   Recommendations for Outpatient Follow-up:   Follow up with PCP in 1-2 weeks  PCP Please obtain BMP/CBC, 2 view CXR in 1week,  (see Discharge instructions)   PCP Please follow up on the following pending results: review CT report in detail.   Home Health: RN,PT   Equipment/Devices: o2  Consultations: IR Discharge Condition: Stable    CODE STATUS: Full    Diet Recommendation: Heart Healthy     Chief Complaint  Patient presents with   Shortness of Breath     Brief history of present illness from the day of admission and additional interim summary     Patient is a 73 y.o. male with PMHx of HFpEF (02/2018 EF 65%)and CAD (05/2014 RCA & LCX PCI), liver abscess s/p percutaneous JP drain placement (at [facility]) on 10/18 (Cipro through 03/14/19), PAF on anticoagulation-who was Covid positive at [facility] on 10/26 and 11/1-subsequently discharged home-who presented to [facility] emergency department with gradual worsening shortness of breath-found to have acute hypoxic respiratory failure secondary to COVID-19 pneumonia, subsequently transferred to triad hospitalist service at [facility].  Significant Events: 10/26 Covid positive at [facility] 11/1 CTA chest [place] consistent with Covid pneumonitis 11/4 admit to [facility] via [facility]                                                                   Hospital Course   Acute Hypoxic Resp Failure due to Covid 19 Viral pneumonia: He was appropriately started on IV steroids and remdesivir combination with good effect, initially requiring 6 L nasal cannula oxygen now down to 2 L.  Clinically cloase  to his baseline, will be discharged on PO steroid taper and 2lit Home o2 with PCP follow up .  COVID-19 Labs  Recent Labs    03/09/19 0750 03/10/19 0155 03/11/19 0200  DDIMER 6.47* 2.85* 2.77*  CRP <0.8 <0.8 <0.8    Lab Results  Component Value Date   SARSCOV2NAA POSITIVE (A) 03/02/2019   SARSCOV2NAA NOT DETECTED 02/06/2019     Markedly elevated D-dimer: Likely due to intense underlying inflammation however DVT cannot be ruled out, lower extremity venous duplex is negative, he is already on Eliquis and temporarily placed on Lovenox, D Dimer trending down, switch back to home dose Eliquis .  Paroxysmal atrial fibrillation with acute RVR: Now in NSR have added Cardizem due to metoprolol allergy, anticoagulation as above, last echo in chart shows a preserved EF of 55 to 60%.  Hepatic abscess: Status post percutaneous drain placement 10/18- treated with Rocephin and Flagyl-was to have repeat imaging at [facility]  IR was  consulted and drain was removed on 03/08/2019 per [facility] plan and repeat CT.  Still has tiny abscess but much improved size, continue ABX . [facility] follow up post DC within 1 week - [facility] to stop ABX.  CAD-PCI to RCA and LCx February 2016, not on any maintenance medications.  Hypothyroidism: Synthroid home dose.  Debility/deconditioning: refused SNF.  Will go with home PT.  Home PT and oxygen have been ordered.  Obesity: BMI of 33.  Follow with PCP for weight loss.   Acute on chronic diastolic CHF, baseline EF 60% on echocardiogram done April 2018.  Lasix dependent at home.  Lasix resumed on 03/07/2019.  Appears compensated now.  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 PCP top follow and monitor.  Discharge diagnosis     Principal Problem:   Acute Hypoxic Respiratory failure, acute due to COVID PNA Active Problems:   CAD S/P RCA and CFX DES Feb 2016   Essential hypertension   Chronic anticoagulation-Eliquis   A-fib with  RVR   Acute respiratory disease due to COVID-19 virus   Pneumonia due to COVID-19 virus   Liver abscess- s/p JP drain    Discharge instructions    Discharge Instructions    MyChart COVID-19 home monitoring program   Complete by: Mar 11, 2019    Is the patient willing to use the [place] for home monitoring?: Yes   Temperature monitoring   Complete by: Mar 11, 2019    After how many days would you like to receive a notification of this patient's flowsheet entries?: 1      Discharge Medications   Allergies as of 03/11/2019      Reactions   Atorvastatin Other (See Comments)   Arthralgias   Lisinopril Other (See Comments)   CP and SOB   Metoprolol Other (See Comments)   CP and SOb   Rocephin [ceftriaxone Sodium In Dextrose] Rash   Rocephin [ceftriaxone] Rash      Medication List    TAKE these medications   acetaminophen 500 MG tablet Commonly known as: TYLENOL Take 1,000 mg by mouth every 6 (six) hours as needed for mild pain or fever.   allopurinol 300 MG tablet Commonly known as: ZYLOPRIM Take 300 mg by mouth every morning.   ciprofloxacin 500 MG tablet Commonly known as: CIPRO Take 1 tablet (500 mg total) by mouth 2 (two) times daily.   [employer] Allergy Relief 25 mg capsule Generic drug: diphenhydrAMINE Take 25 mg by mouth every 4 (four) hours as needed for allergies.   diltiazem 120 MG 24 hr capsule Commonly known as: Cardizem CD Take 1 capsule (120 mg total) by mouth daily.   docusate sodium 100 MG capsule Commonly known as: COLACE Take 100 mg by mouth daily as needed for constipation.   Eliquis 5 MG Tabs tablet Generic drug: apixaban TAKE 1 TABLET BY MOUTH TWICE A DAY What changed: how much to take   furosemide 40 MG tablet Commonly known as: LASIX TAKE ONE TABLET BY MOUTH DAILY,MAY TAKE EXTRA TAB DAILY AS NEEDED FOR WEIGHT GAIN/SWELLING What changed: See the new instructions.   HYDROcodone-acetaminophen 5-325 MG tablet Commonly known as:  NORCO/VICODIN Take 1 tablet by mouth every 6 (six) hours as needed for moderate pain.   methylPREDNISolone 4 MG Tbpk tablet Commonly known as: MEDROL DOSEPAK follow package directions   metroNIDAZOLE 500 MG tablet Commonly known as: FLAGYL Take 1 tablet (500 mg total) by mouth 3 (three)  times daily for 25 days. Complete the pills you have and follow with your [facility] MD before stopping. What changed:   additional instructions  Another medication with the same name was removed. Continue taking this medication, and follow the directions you see here.   Synthroid 137 MCG tablet Generic drug: levothyroxine Take 1 tablet (137 mcg total) by mouth daily. Confirm the dose with your PCP What changed:   additional instructions  Another medication with the same name was removed. Continue taking this medication, and follow the directions you see here.            Durable Medical Equipment  (From admission, onward)         Start     Ordered   03/10/19 1320  For home use only DME Walker rolling  Once    Question:  Patient needs a walker to treat with the following condition  Answer:  Generalized weakness   03/10/19 1320   03/09/19 0710  DME Walker rolling 5 wheels  Once    Comments: 5 wheel  Question:  Patient needs a walker to treat with the following condition  Answer:  Weakness   03/09/19 0709   03/09/19 0710  For home use only DME 3 n 1  Once     03/09/19 0709   03/07/19 1059  For home use only DME oxygen  Once    Question Answer Comment  Length of Need 6 Months   Mode or (Route) Nasal cannula   Liters per Minute 2   Frequency Continuous (stationary and portable oxygen unit needed)   Oxygen conserving device Yes   Oxygen delivery system Gas      03/07/19 1058          Follow-up Information    Celene Squibb, MD. Schedule an appointment as soon as possible for a visit in 1 week(s).   Specialty: Internal Medicine Why: and with your [facility] MD in 1 week Contact  information: [address] [facility] [id number] [phone]           Major procedures and Radiology Reports - PLEASE review detailed and final reports thoroughly  -         Dg Pelvis Portable  Result Date: 03/02/2019 CLINICAL DATA:  Right hip pain.  No injury. EXAM: PORTABLE PELVIS 1-2 VIEWS COMPARISON:  CT abdomen pelvis dated February 06, 2019. FINDINGS: No acute fracture or dislocation. Prior left proximal femur ORIF. Unchanged mild bilateral hip joint space narrowing with small marginal osteophytes. The pubic symphysis and sacroiliac joints are unremarkable. Lower lumbar spondylosis. Bone mineralization is normal. Soft tissues are unremarkable. IMPRESSION: 1.  No acute osseous abnormality. 2. Unchanged mild bilateral hip degenerative changes. Electronically Signed   By: Titus Dubin M.D.   On: 03/02/2019 11:27   Dg Chest Port 1 View  Result Date: 03/04/2019 CLINICAL DATA:  COVID-19 pneumonia. EXAM: PORTABLE CHEST 1 VIEW COMPARISON:  03/03/2019 FINDINGS: Lordotic technique demonstrated. Lungs are adequately inflated with slight overall worsening bilateral patchy airspace process left lung worse than right. No definite effusion. Cardiomediastinal silhouette and remainder of the exam is unchanged. IMPRESSION: Slight interval worsening of diffuse bilateral patchy airspace process compatible with patient's known COVID-19 pneumonia. Electronically Signed   By: Marin Olp M.D.   On: 03/04/2019 09:00   Dg Chest Port 1 View  Result Date: 03/03/2019 CLINICAL DATA:  Shortness of breath. EXAM: PORTABLE CHEST 1 VIEW COMPARISON:  03/02/2019 FINDINGS: Low lung volumes with interstitial and airspace  opacities unchanged compared to prior study. Worse in the peripheral left chest. Cardiomediastinal contours are stable. No signs of acute bone finding with evidence of prior cervicothoracic spinal fusion, partially imaged. IMPRESSION: Findings remain concerning for viral or atypical pneumonia.  Asymmetric edema could have a similar appearance. Electronically Signed   By: Zetta Bills M.D.   On: 03/03/2019 11:43   Dg Chest Port 1 View  Result Date: 03/02/2019 CLINICAL DATA:  Severe shortness of breath. EXAM: PORTABLE CHEST 1 VIEW COMPARISON:  Chest x-ray dated February 06, 2019. FINDINGS: The heart size and mediastinal contours are within normal limits. Normal pulmonary vascularity. Patchy asymmetric peripheral opacities in the left greater than right mid to lower lungs. No pleural effusion or pneumothorax. Unchanged elevation of the right hemidiaphragm. No acute osseous abnormality. IMPRESSION: 1. There are findings in the lungs which are nonspecific, but concerning for atypical infection, including potential viral pneumonia. Electronically Signed   By: Titus Dubin M.D.   On: 03/02/2019 11:25   Ct Liver Abdomen W Wo Contrast  Result Date: 03/08/2019 CLINICAL DATA:  Inpatient. Percutaneous drainage of reported liver abscess at outside facility on 02/13/2019. Currently hospitalized with COVID-19 pneumonia. EXAM: CT ABDOMEN WITHOUT AND WITH CONTRAST TECHNIQUE: Multidetector CT imaging of the abdomen was performed following the standard protocol before and following the bolus administration of intravenous contrast. CONTRAST:  150mL OMNIPAQUE IOHEXOL 350 MG/ML SOLN COMPARISON:  02/06/2019 CT abdomen/pelvis. FINDINGS: Lower chest: Extensive patchy peripheral ground-glass opacity and bandlike consolidation throughout both lung bases, new since 02/06/2019. Coronary atherosclerosis. Hepatobiliary: Normal liver size. Hypodense 2.3 x 1.8 cm segment 7 right liver lesion (series 8/image 23) with percutaneous drain at inferior margin, decreased from 5.2 x 4.7 cm on 02/06/2019 CT, without appreciable solid enhancement or wall thickening. Subcentimeter hypodense extreme right liver dome lesion (series 8/image 5), too small to characterize, not definitely seen on prior noncontrast CT. Simple extreme inferior  right liver lobe 1.3 cm cyst. No additional liver lesions. Cholecystectomy. No biliary ductal dilatation. Pancreas: Normal, with no mass or duct dilation. Spleen: Normal size. No mass. Adrenals/Urinary Tract: Normal adrenals. No renal stones. No hydronephrosis. Small simple left renal cysts, largest 2.3 cm in the posterior upper left kidney. Stomach/Bowel: Normal non-distended stomach. Visualized small and large bowel is normal caliber, with no bowel wall thickening. Vascular/Lymphatic: Atherosclerotic abdominal aorta with stable 3.2 cm infrarenal abdominal aortic aneurysm using similar measurement technique. Patent portal, splenic, hepatic and renal veins. No pathologically enlarged lymph nodes in the abdomen. Other: No pneumoperitoneum, ascites or focal fluid collection. Musculoskeletal: No aggressive appearing focal osseous lesions. Marked thoracolumbar spondylosis. IMPRESSION: 1. Percutaneous drain terminates at the inferior margin of a low-attenuation 2.3 x 1.8 cm segment 7 right liver lesion, which has significantly decreased in size from 5.2 x 4.7 cm on 02/06/2019 CT, and which demonstrates no solid enhancement or wall thickening. Findings favor decreased size of a complex liver cyst (which has been present on CT studies back to 2014). 2. Extensive patchy peripheral ground-glass opacity and bandlike consolidation at the lung bases, new since 02/06/2019, compatible with COVID-19 pneumonia. 3. Infrarenal 3.2 cm Abdominal Aortic Aneurysm (ICD10-I71.9). Recommend follow-up aortic ultrasound in 3 years. This recommendation follows ACR consensus guidelines: White Paper of the ACR Incidental Findings Committee II on Vascular Findings. J Am Coll Radiol 2013CJ:3944253. 4.  Aortic Atherosclerosis (ICD10-I70.0). Electronically Signed   By: Ilona Sorrel M.D.   On: 03/08/2019 14:14   Vas [place] Lower Extremity Venous (dvt)  Result Date: 03/05/2019  Lower  Venous Study Other Indications: Covid Positive, elevated d-diimer.  Comparison Study: No priors Performing Technologist: Velva Harman Sturdivant RDMS, RVT  Examination Guidelines: A complete evaluation includes B-mode imaging, spectral Doppler, color Doppler, and power Doppler as needed of all accessible portions of each vessel. Bilateral testing is considered an integral part of a complete examination. Limited examinations for reoccurring indications may be performed as noted.  +---------+---------------+---------+-----------+----------+--------------+  RIGHT     Compressibility Phasicity Spontaneity Properties Thrombus Aging  +---------+---------------+---------+-----------+----------+--------------+  CFV       Full            Yes       Yes                                    +---------+---------------+---------+-----------+----------+--------------+  SFJ       Full                                                             +---------+---------------+---------+-----------+----------+--------------+  FV Prox   Full                                                             +---------+---------------+---------+-----------+----------+--------------+  FV Mid    Full                                                             +---------+---------------+---------+-----------+----------+--------------+  FV Distal Full                                                             +---------+---------------+---------+-----------+----------+--------------+  PFV       Full                                                             +---------+---------------+---------+-----------+----------+--------------+  POP       Full            Yes       Yes                                    +---------+---------------+---------+-----------+----------+--------------+  PTV       Full                                                             +---------+---------------+---------+-----------+----------+--------------+  PERO      Full                                                              +---------+---------------+---------+-----------+----------+--------------+   +---------+---------------+---------+-----------+----------+--------------+  LEFT      Compressibility Phasicity Spontaneity Properties Thrombus Aging  +---------+---------------+---------+-----------+----------+--------------+  CFV       Full            Yes       Yes                                    +---------+---------------+---------+-----------+----------+--------------+  SFJ       Full                                                             +---------+---------------+---------+-----------+----------+--------------+  FV Prox   Full                                                             +---------+---------------+---------+-----------+----------+--------------+  FV Mid    Full                                                             +---------+---------------+---------+-----------+----------+--------------+  FV Distal Full                                                             +---------+---------------+---------+-----------+----------+--------------+  PFV       Full                                                             +---------+---------------+---------+-----------+----------+--------------+  POP       Full            Yes       Yes                                    +---------+---------------+---------+-----------+----------+--------------+  PTV       Full                                                             +---------+---------------+---------+-----------+----------+--------------+  PERO      Full                                                             +---------+---------------+---------+-----------+----------+--------------+     Summary: Right: There is no evidence of deep vein thrombosis in the lower extremity. No cystic structure found in the popliteal fossa. Left: There is no evidence of deep vein thrombosis in the lower extremity. No cystic structure found in the popliteal fossa.  *See  table(s) above for measurements and observations. Electronically signed by Curt Jews MD on 03/05/2019 at 7:06:10 AM.    Final     Micro Results     Recent Results (from the past 240 hour(s))  SARS Coronavirus 2 by RT PCR (hospital order, performed in [facility] hospital lab) Nasopharyngeal Nasopharyngeal Swab     Status: Abnormal   Collection Time: 03/02/19 11:08 AM   Specimen: Nasopharyngeal Swab  Result Value Ref Range Status   SARS Coronavirus 2 POSITIVE (A) NEGATIVE Final    Comment: RESULT CALLED TO, READ BACK BY AND VERIFIED WITH: R HOLCOLMB AT 1410 BY HFLYNT 03/02/19 (NOTE) If result is NEGATIVE SARS-CoV-2 target nucleic acids are NOT DETECTED. The SARS-CoV-2 RNA is generally detectable in upper and lower  respiratory specimens during the acute phase of infection. The lowest  concentration of SARS-CoV-2 viral copies this assay can detect is 250  copies / mL. A negative result does not preclude SARS-CoV-2 infection  and should not be used as the sole basis for treatment or other  patient management decisions.  A negative result may occur with  improper specimen collection / handling, submission of specimen other  than nasopharyngeal swab, presence of viral mutation(s) within the  areas targeted by this assay, and inadequate number of viral copies  (<250 copies / mL). A negative result must be combined with clinical  observations, patient history, and epidemiological information. If result is POSITIVE SARS-CoV-2 target nucleic acids are DETECTED.  The SARS-CoV-2 RNA is generally detectable in upper and lower  respiratory specimens during the acute phase of infection.  Positive  results are indicative of active infection with SARS-CoV-2.  Clinical  correlation with patient history and other diagnostic information is  necessary to determine patient infection status.  Positive results do  not rule out bacterial infection or co-infection with other viruses. If result is  PRESUMPTIVE POSTIVE SARS-CoV-2 nucleic acids MAY BE PRESENT.   A presumptive positive result was obtained on the submitted specimen  and confirmed on repeat testing.  While 2019 novel coronavirus  (SARS-CoV-2) nucleic acids may be present in the submitted sample  additional confirmatory testing may be necessary for epidemiological  and / or clinical management purposes  to differentiate between  SARS-CoV-2 and other Sarbecovirus currently known to infect humans.  If clinically indicated additional testing with an alternate test  methodology [phone])  is advised. The SARS-CoV-2 RNA is generally  detectable in upper and lower respiratory specimens during the acute  phase of infection. The expected result is Negative. Fact Sheet for Patients:  StrictlyIdeas.no Fact Sheet for Healthcare Providers: BankingDealers.co.za This test is not yet approved or cleared by the [place] FDA and has been authorized for detection and/or diagnosis of SARS-CoV-2 by FDA under an Emergency Use Authorization (EUA).  This EUA will  remain in effect (meaning this test can be used) for the duration of the COVID-19 declaration under Section 564(b)(1) of the Act, 21 U.S.C. section 360bbb-3(b)(1), unless the authorization is terminated or revoked sooner. Performed at [facility], [address], [place], [place] [address]   Blood Culture (routine x 2)     Status: None   Collection Time: 03/02/19 11:37 AM   Specimen: BLOOD LEFT ARM  Result Value Ref Range Status   Specimen Description BLOOD LEFT ARM  Final   Special Requests   Final    BOTTLES DRAWN AEROBIC AND ANAEROBIC Blood Culture adequate volume   Culture   Final    NO GROWTH 5 DAYS Performed at [facility], [address] [place], [place] [address]    Report Status 03/07/2019 FINAL  Final  Blood Culture (routine x 2)     Status: None   Collection Time: 03/02/19 11:37 AM   Specimen: BLOOD LEFT ARM    Result Value Ref Range Status   Specimen Description BLOOD LEFT ARM  Final   Special Requests   Final    BOTTLES DRAWN AEROBIC AND ANAEROBIC Blood Culture adequate volume   Culture   Final    NO GROWTH 5 DAYS Performed at [facility], [address] [place], [place] [address]    Report Status 03/07/2019 FINAL  Final    Today   Subjective    Wayne Ortiz today has no headache,no chest abdominal pain,no new weakness tingling or numbness, feels much better wants to go home today.     Objective   Blood pressure (!) 109/93, pulse 95, temperature 97.9 F (36.6 C), temperature source Oral, resp. rate 18, height 6' (1.829 m), weight 113.4 kg, SpO2 95 %.   Intake/Output Summary (Last 24 hours) at 03/11/2019 0853 Last data filed at 03/11/2019 0500 Gross per 24 hour  Intake 680 ml  Output 925 ml  Net -245 ml    Exam  Awake Alert, Oriented x 3, No new F.N deficits, Normal affect [place].AT,PERRAL Supple Neck,No JVD, No cervical lymphadenopathy appriciated.  Symmetrical Chest wall movement, Good air movement bilaterally, CTAB RRR,No Gallops,Rubs or new Murmurs, No Parasternal Heave +ve B.Sounds, Abd Soft, Non tender, No organomegaly appriciated, No rebound -guarding or rigidity. No Cyanosis, Clubbing or edema, No new Rash or bruise   Data Review   CBC w Diff:  Lab Results  Component Value Date   WBC 13.3 (H) 03/11/2019   HGB 15.0 03/11/2019   HGB 17.0 01/08/2017   HCT 45.5 03/11/2019   HCT 50.5 01/08/2017   PLT 87 (L) 03/11/2019   PLT 144 (L) 01/08/2017   LYMPHOPCT 3 03/11/2019   MONOPCT 4 03/11/2019   EOSPCT 0 03/11/2019   BASOPCT 0 03/11/2019    CMP:  Lab Results  Component Value Date   NA 134 (L) 03/11/2019   NA 145 (H) 01/08/2017   K 5.1 03/11/2019   CL 102 03/11/2019   CO2 24 03/11/2019   BUN 30 (H) 03/11/2019   BUN 20 01/08/2017   CREATININE 0.96 03/11/2019   CREATININE 1.08 03/08/2015   PROT 5.9 (L) 03/11/2019   PROT 6.8 01/08/2017   ALBUMIN 2.6 (L)  03/11/2019   ALBUMIN 4.5 01/08/2017   BILITOT 0.8 03/11/2019   BILITOT 0.4 01/08/2017   ALKPHOS 53 03/11/2019   AST 33 03/11/2019   ALT 39 03/11/2019  .   Total Time in preparing paper work, data evaluation and todays exam - 24 minutes  Lala Lund M.D on  03/11/2019 at 8:53 AM  [facility]   Office  [phone]

## 2019-03-16 DIAGNOSIS — K769 Liver disease, unspecified: Secondary | ICD-10-CM | POA: Diagnosis not present

## 2019-03-16 DIAGNOSIS — K750 Abscess of liver: Secondary | ICD-10-CM | POA: Diagnosis not present

## 2019-03-17 DIAGNOSIS — R0600 Dyspnea, unspecified: Secondary | ICD-10-CM | POA: Diagnosis not present

## 2019-03-22 ENCOUNTER — Other Ambulatory Visit (HOSPITAL_COMMUNITY): Payer: Self-pay | Admitting: Internal Medicine

## 2019-03-22 DIAGNOSIS — J9601 Acute respiratory failure with hypoxia: Secondary | ICD-10-CM | POA: Diagnosis not present

## 2019-03-22 DIAGNOSIS — J189 Pneumonia, unspecified organism: Secondary | ICD-10-CM

## 2019-03-22 DIAGNOSIS — U071 COVID-19: Secondary | ICD-10-CM | POA: Diagnosis not present

## 2019-03-22 DIAGNOSIS — R531 Weakness: Secondary | ICD-10-CM | POA: Diagnosis not present

## 2019-03-22 DIAGNOSIS — I480 Paroxysmal atrial fibrillation: Secondary | ICD-10-CM | POA: Diagnosis not present

## 2019-03-25 ENCOUNTER — Ambulatory Visit (HOSPITAL_COMMUNITY)
Admission: RE | Admit: 2019-03-25 | Discharge: 2019-03-25 | Disposition: A | Discharge status: Home / Self Care | Payer: Medicare HMO | Source: Ambulatory Visit | Attending: Internal Medicine | Admitting: Internal Medicine

## 2019-03-25 ENCOUNTER — Other Ambulatory Visit: Payer: Self-pay

## 2019-03-25 DIAGNOSIS — R05 Cough: Secondary | ICD-10-CM | POA: Diagnosis not present

## 2019-03-25 DIAGNOSIS — J189 Pneumonia, unspecified organism: Secondary | ICD-10-CM | POA: Diagnosis not present

## 2019-03-25 DIAGNOSIS — R0602 Shortness of breath: Secondary | ICD-10-CM | POA: Diagnosis not present

## 2019-03-28 DIAGNOSIS — G9009 Other idiopathic peripheral autonomic neuropathy: Secondary | ICD-10-CM | POA: Diagnosis not present

## 2019-03-28 DIAGNOSIS — E039 Hypothyroidism, unspecified: Secondary | ICD-10-CM | POA: Diagnosis not present

## 2019-03-28 DIAGNOSIS — N400 Enlarged prostate without lower urinary tract symptoms: Secondary | ICD-10-CM | POA: Diagnosis not present

## 2019-03-28 DIAGNOSIS — M25562 Pain in left knee: Secondary | ICD-10-CM | POA: Diagnosis not present

## 2019-03-28 DIAGNOSIS — Z96651 Presence of right artificial knee joint: Secondary | ICD-10-CM | POA: Diagnosis not present

## 2019-03-28 DIAGNOSIS — D696 Thrombocytopenia, unspecified: Secondary | ICD-10-CM | POA: Diagnosis not present

## 2019-03-28 DIAGNOSIS — G8929 Other chronic pain: Secondary | ICD-10-CM | POA: Diagnosis not present

## 2019-03-28 DIAGNOSIS — I482 Chronic atrial fibrillation, unspecified: Secondary | ICD-10-CM | POA: Diagnosis not present

## 2019-03-28 DIAGNOSIS — E782 Mixed hyperlipidemia: Secondary | ICD-10-CM | POA: Diagnosis not present

## 2019-03-28 DIAGNOSIS — R0602 Shortness of breath: Secondary | ICD-10-CM | POA: Diagnosis not present

## 2019-04-04 DIAGNOSIS — U071 COVID-19: Secondary | ICD-10-CM | POA: Diagnosis not present

## 2019-04-04 DIAGNOSIS — J984 Other disorders of lung: Secondary | ICD-10-CM | POA: Diagnosis not present

## 2019-04-04 DIAGNOSIS — J22 Unspecified acute lower respiratory infection: Secondary | ICD-10-CM | POA: Diagnosis not present

## 2019-04-04 DIAGNOSIS — Z8679 Personal history of other diseases of the circulatory system: Secondary | ICD-10-CM | POA: Diagnosis not present

## 2019-04-04 DIAGNOSIS — R0602 Shortness of breath: Secondary | ICD-10-CM | POA: Diagnosis not present

## 2019-04-04 DIAGNOSIS — R942 Abnormal results of pulmonary function studies: Secondary | ICD-10-CM | POA: Diagnosis not present

## 2019-04-05 DIAGNOSIS — M6281 Muscle weakness (generalized): Secondary | ICD-10-CM | POA: Diagnosis not present

## 2019-04-05 DIAGNOSIS — R2689 Other abnormalities of gait and mobility: Secondary | ICD-10-CM | POA: Diagnosis not present

## 2019-04-06 DIAGNOSIS — I482 Chronic atrial fibrillation, unspecified: Secondary | ICD-10-CM | POA: Diagnosis not present

## 2019-04-10 DIAGNOSIS — U071 COVID-19: Secondary | ICD-10-CM | POA: Diagnosis not present

## 2019-04-14 DIAGNOSIS — I509 Heart failure, unspecified: Secondary | ICD-10-CM | POA: Diagnosis not present

## 2019-04-14 DIAGNOSIS — E261 Secondary hyperaldosteronism: Secondary | ICD-10-CM | POA: Diagnosis not present

## 2019-04-14 DIAGNOSIS — E785 Hyperlipidemia, unspecified: Secondary | ICD-10-CM | POA: Diagnosis not present

## 2019-04-14 DIAGNOSIS — I10 Essential (primary) hypertension: Secondary | ICD-10-CM | POA: Diagnosis not present

## 2019-04-14 DIAGNOSIS — Z87891 Personal history of nicotine dependence: Secondary | ICD-10-CM | POA: Diagnosis not present

## 2019-04-14 DIAGNOSIS — I4891 Unspecified atrial fibrillation: Secondary | ICD-10-CM | POA: Diagnosis not present

## 2019-04-14 DIAGNOSIS — E039 Hypothyroidism, unspecified: Secondary | ICD-10-CM | POA: Diagnosis not present

## 2019-04-14 DIAGNOSIS — I480 Paroxysmal atrial fibrillation: Secondary | ICD-10-CM | POA: Diagnosis not present

## 2019-04-14 DIAGNOSIS — D6869 Other thrombophilia: Secondary | ICD-10-CM | POA: Diagnosis not present

## 2019-04-14 DIAGNOSIS — M109 Gout, unspecified: Secondary | ICD-10-CM | POA: Diagnosis not present

## 2019-04-14 DIAGNOSIS — I25119 Atherosclerotic heart disease of native coronary artery with unspecified angina pectoris: Secondary | ICD-10-CM | POA: Diagnosis not present

## 2019-04-14 DIAGNOSIS — E669 Obesity, unspecified: Secondary | ICD-10-CM | POA: Diagnosis not present

## 2019-04-14 DIAGNOSIS — I2510 Atherosclerotic heart disease of native coronary artery without angina pectoris: Secondary | ICD-10-CM | POA: Diagnosis not present

## 2019-04-14 DIAGNOSIS — M1990 Unspecified osteoarthritis, unspecified site: Secondary | ICD-10-CM | POA: Diagnosis not present

## 2019-04-14 DIAGNOSIS — G8929 Other chronic pain: Secondary | ICD-10-CM | POA: Diagnosis not present

## 2019-04-15 DIAGNOSIS — R0981 Nasal congestion: Secondary | ICD-10-CM | POA: Diagnosis not present

## 2019-04-15 DIAGNOSIS — H9202 Otalgia, left ear: Secondary | ICD-10-CM | POA: Diagnosis not present

## 2019-04-19 DIAGNOSIS — E039 Hypothyroidism, unspecified: Secondary | ICD-10-CM | POA: Diagnosis not present

## 2019-04-19 DIAGNOSIS — M109 Gout, unspecified: Secondary | ICD-10-CM | POA: Diagnosis not present

## 2019-04-19 DIAGNOSIS — I482 Chronic atrial fibrillation, unspecified: Secondary | ICD-10-CM | POA: Diagnosis not present

## 2019-04-19 DIAGNOSIS — E782 Mixed hyperlipidemia: Secondary | ICD-10-CM | POA: Diagnosis not present

## 2019-04-19 DIAGNOSIS — G8929 Other chronic pain: Secondary | ICD-10-CM | POA: Diagnosis not present

## 2019-04-20 DIAGNOSIS — I480 Paroxysmal atrial fibrillation: Secondary | ICD-10-CM | POA: Diagnosis not present

## 2019-05-08 DIAGNOSIS — E039 Hypothyroidism, unspecified: Secondary | ICD-10-CM | POA: Diagnosis not present

## 2019-05-08 DIAGNOSIS — G8929 Other chronic pain: Secondary | ICD-10-CM | POA: Diagnosis not present

## 2019-05-08 DIAGNOSIS — M109 Gout, unspecified: Secondary | ICD-10-CM | POA: Diagnosis not present

## 2019-05-08 DIAGNOSIS — E7849 Other hyperlipidemia: Secondary | ICD-10-CM | POA: Diagnosis not present

## 2019-05-08 DIAGNOSIS — I482 Chronic atrial fibrillation, unspecified: Secondary | ICD-10-CM | POA: Diagnosis not present

## 2019-05-11 DIAGNOSIS — U071 COVID-19: Secondary | ICD-10-CM | POA: Diagnosis not present

## 2019-05-15 DIAGNOSIS — D6869 Other thrombophilia: Secondary | ICD-10-CM | POA: Diagnosis not present

## 2019-05-15 DIAGNOSIS — I25119 Atherosclerotic heart disease of native coronary artery with unspecified angina pectoris: Secondary | ICD-10-CM | POA: Diagnosis not present

## 2019-05-15 DIAGNOSIS — G8929 Other chronic pain: Secondary | ICD-10-CM | POA: Diagnosis not present

## 2019-05-15 DIAGNOSIS — I509 Heart failure, unspecified: Secondary | ICD-10-CM | POA: Diagnosis not present

## 2019-05-15 DIAGNOSIS — I4891 Unspecified atrial fibrillation: Secondary | ICD-10-CM | POA: Diagnosis not present

## 2019-05-15 DIAGNOSIS — I739 Peripheral vascular disease, unspecified: Secondary | ICD-10-CM | POA: Diagnosis not present

## 2019-05-15 DIAGNOSIS — M109 Gout, unspecified: Secondary | ICD-10-CM | POA: Diagnosis not present

## 2019-05-15 DIAGNOSIS — E039 Hypothyroidism, unspecified: Secondary | ICD-10-CM | POA: Diagnosis not present

## 2019-05-15 DIAGNOSIS — I110 Hypertensive heart disease with heart failure: Secondary | ICD-10-CM | POA: Diagnosis not present

## 2019-05-15 DIAGNOSIS — E669 Obesity, unspecified: Secondary | ICD-10-CM | POA: Diagnosis not present

## 2019-05-19 DIAGNOSIS — R69 Illness, unspecified: Secondary | ICD-10-CM | POA: Diagnosis not present

## 2019-06-22 DIAGNOSIS — Z8616 Personal history of COVID-19: Secondary | ICD-10-CM | POA: Diagnosis not present

## 2019-06-22 DIAGNOSIS — I480 Paroxysmal atrial fibrillation: Secondary | ICD-10-CM | POA: Diagnosis not present

## 2019-06-22 DIAGNOSIS — M79669 Pain in unspecified lower leg: Secondary | ICD-10-CM | POA: Diagnosis not present

## 2019-06-22 DIAGNOSIS — G9009 Other idiopathic peripheral autonomic neuropathy: Secondary | ICD-10-CM | POA: Diagnosis not present

## 2019-06-22 DIAGNOSIS — I714 Abdominal aortic aneurysm, without rupture: Secondary | ICD-10-CM | POA: Diagnosis not present

## 2019-06-22 DIAGNOSIS — Z7901 Long term (current) use of anticoagulants: Secondary | ICD-10-CM | POA: Diagnosis not present

## 2019-06-22 DIAGNOSIS — M109 Gout, unspecified: Secondary | ICD-10-CM | POA: Diagnosis not present

## 2019-06-22 DIAGNOSIS — E039 Hypothyroidism, unspecified: Secondary | ICD-10-CM | POA: Diagnosis not present

## 2019-06-22 DIAGNOSIS — J309 Allergic rhinitis, unspecified: Secondary | ICD-10-CM | POA: Diagnosis not present

## 2019-06-22 DIAGNOSIS — H6123 Impacted cerumen, bilateral: Secondary | ICD-10-CM | POA: Diagnosis not present

## 2019-06-22 DIAGNOSIS — E782 Mixed hyperlipidemia: Secondary | ICD-10-CM | POA: Diagnosis not present

## 2019-06-22 DIAGNOSIS — G8929 Other chronic pain: Secondary | ICD-10-CM | POA: Diagnosis not present

## 2019-07-01 ENCOUNTER — Other Ambulatory Visit: Payer: Self-pay | Admitting: Internal Medicine

## 2019-07-01 DIAGNOSIS — Z8616 Personal history of COVID-19: Secondary | ICD-10-CM | POA: Diagnosis not present

## 2019-07-01 DIAGNOSIS — E782 Mixed hyperlipidemia: Secondary | ICD-10-CM | POA: Diagnosis not present

## 2019-07-01 DIAGNOSIS — Z7901 Long term (current) use of anticoagulants: Secondary | ICD-10-CM | POA: Diagnosis not present

## 2019-07-01 DIAGNOSIS — R0981 Nasal congestion: Secondary | ICD-10-CM | POA: Diagnosis not present

## 2019-07-01 DIAGNOSIS — Z6836 Body mass index (BMI) 36.0-36.9, adult: Secondary | ICD-10-CM | POA: Diagnosis not present

## 2019-07-01 DIAGNOSIS — J309 Allergic rhinitis, unspecified: Secondary | ICD-10-CM | POA: Diagnosis not present

## 2019-07-01 DIAGNOSIS — H9202 Otalgia, left ear: Secondary | ICD-10-CM | POA: Diagnosis not present

## 2019-07-01 DIAGNOSIS — N400 Enlarged prostate without lower urinary tract symptoms: Secondary | ICD-10-CM | POA: Diagnosis not present

## 2019-07-01 DIAGNOSIS — M79669 Pain in unspecified lower leg: Secondary | ICD-10-CM | POA: Diagnosis not present

## 2019-07-01 DIAGNOSIS — I739 Peripheral vascular disease, unspecified: Secondary | ICD-10-CM

## 2019-07-01 DIAGNOSIS — Z712 Person consulting for explanation of examination or test findings: Secondary | ICD-10-CM | POA: Diagnosis not present

## 2019-07-05 DIAGNOSIS — D696 Thrombocytopenia, unspecified: Secondary | ICD-10-CM | POA: Diagnosis not present

## 2019-07-05 DIAGNOSIS — N400 Enlarged prostate without lower urinary tract symptoms: Secondary | ICD-10-CM | POA: Diagnosis not present

## 2019-07-05 DIAGNOSIS — E039 Hypothyroidism, unspecified: Secondary | ICD-10-CM | POA: Diagnosis not present

## 2019-07-05 DIAGNOSIS — G8929 Other chronic pain: Secondary | ICD-10-CM | POA: Diagnosis not present

## 2019-07-05 DIAGNOSIS — G9009 Other idiopathic peripheral autonomic neuropathy: Secondary | ICD-10-CM | POA: Diagnosis not present

## 2019-07-05 DIAGNOSIS — R0602 Shortness of breath: Secondary | ICD-10-CM | POA: Diagnosis not present

## 2019-07-05 DIAGNOSIS — Z96651 Presence of right artificial knee joint: Secondary | ICD-10-CM | POA: Diagnosis not present

## 2019-07-05 DIAGNOSIS — E782 Mixed hyperlipidemia: Secondary | ICD-10-CM | POA: Diagnosis not present

## 2019-07-05 DIAGNOSIS — M25562 Pain in left knee: Secondary | ICD-10-CM | POA: Diagnosis not present

## 2019-07-05 DIAGNOSIS — I482 Chronic atrial fibrillation, unspecified: Secondary | ICD-10-CM | POA: Diagnosis not present

## 2019-07-08 ENCOUNTER — Other Ambulatory Visit: Payer: Self-pay

## 2019-07-08 ENCOUNTER — Ambulatory Visit (HOSPITAL_COMMUNITY)
Admission: RE | Admit: 2019-07-08 | Discharge: 2019-07-08 | Disposition: A | Discharge status: Home / Self Care | Payer: Medicare HMO | Source: Ambulatory Visit | Attending: Internal Medicine | Admitting: Internal Medicine

## 2019-07-08 DIAGNOSIS — I739 Peripheral vascular disease, unspecified: Secondary | ICD-10-CM | POA: Diagnosis not present

## 2019-07-08 DIAGNOSIS — I10 Essential (primary) hypertension: Secondary | ICD-10-CM | POA: Diagnosis not present

## 2019-07-14 DIAGNOSIS — I480 Paroxysmal atrial fibrillation: Secondary | ICD-10-CM | POA: Diagnosis not present

## 2019-07-14 DIAGNOSIS — I25119 Atherosclerotic heart disease of native coronary artery with unspecified angina pectoris: Secondary | ICD-10-CM | POA: Diagnosis not present

## 2019-07-14 DIAGNOSIS — R0602 Shortness of breath: Secondary | ICD-10-CM | POA: Diagnosis not present

## 2019-07-27 ENCOUNTER — Telehealth: Payer: Self-pay | Admitting: *Deleted

## 2019-07-27 DIAGNOSIS — M4726 Other spondylosis with radiculopathy, lumbar region: Secondary | ICD-10-CM | POA: Diagnosis not present

## 2019-07-27 NOTE — Telephone Encounter (Signed)
   [facility] Pre-operative Risk Assessment    Request for surgical clearance:  1. What type of surgery is being performed? EPIDURAL STEROID INJECTION   2. When is this surgery scheduled? TBD   3. What type of clearance is required (medical clearance vs. Pharmacy clearance to hold med vs. Both)? PHARM  4. Are there any medications that need to be held prior to surgery and how long? ELIQUIS X 3 DAYS PRIOR TO INJECTION  5. Practice name and name of physician performing surgery? [facility]; DR. PAUL HARKINS   6. What is your office phone number [phone]    7.   What is your office fax number [phone]  8.   Anesthesia type (None, local, MAC, general) ? NONE LISTED   Wayne Ortiz 07/27/2019, 10:42 AM  _________________________________________________________________   (provider comments below)

## 2019-07-27 NOTE — Telephone Encounter (Signed)
Spoke with pt who stated he is now seeing a cardiologist at [facility], I advised pt that he will need to reach out to [facility] so they can reach out to your cardiologist at [facility] to get clearance, pt stated he will give them a call and have them reach out to Dr Nancy Fetter at [facility] cardiology.

## 2019-07-27 NOTE — Telephone Encounter (Signed)
Thank you, I will remove from the pre-op pool.

## 2019-07-27 NOTE — Telephone Encounter (Signed)
Primary Cardiologist:No primary care provider on file.  Chart reviewed as part of pre-operative protocol coverage. Because of Wayne Ortiz past medical history and time since last visit, he/she will require a follow-up visit in order to better assess preoperative cardiovascular risk. He has not been seen by cardiology since 2019.   Pre-op covering staff: - Please schedule appointment and call patient to inform them. - Please contact requesting surgeon's office via preferred method (i.e, phone, fax) to inform them of need for appointment prior to surgery.  If applicable, this message will also be routed to pharmacy pool and/or primary cardiologist for input on holding anticoagulant/antiplatelet agent as requested below so that this information is available at time of patient's appointment.   Jory Sims, NP  07/27/2019, 10:55 AM

## 2019-08-01 DIAGNOSIS — R69 Illness, unspecified: Secondary | ICD-10-CM | POA: Diagnosis not present

## 2019-08-08 DIAGNOSIS — M5416 Radiculopathy, lumbar region: Secondary | ICD-10-CM | POA: Diagnosis not present

## 2019-08-08 DIAGNOSIS — M4726 Other spondylosis with radiculopathy, lumbar region: Secondary | ICD-10-CM | POA: Diagnosis not present

## 2019-09-14 DIAGNOSIS — H6121 Impacted cerumen, right ear: Secondary | ICD-10-CM | POA: Diagnosis not present

## 2019-09-14 DIAGNOSIS — G8929 Other chronic pain: Secondary | ICD-10-CM | POA: Diagnosis not present

## 2019-09-14 DIAGNOSIS — G250 Essential tremor: Secondary | ICD-10-CM | POA: Diagnosis not present

## 2019-09-14 DIAGNOSIS — D696 Thrombocytopenia, unspecified: Secondary | ICD-10-CM | POA: Diagnosis not present

## 2019-09-14 DIAGNOSIS — H6123 Impacted cerumen, bilateral: Secondary | ICD-10-CM | POA: Diagnosis not present

## 2019-09-14 DIAGNOSIS — G9009 Other idiopathic peripheral autonomic neuropathy: Secondary | ICD-10-CM | POA: Diagnosis not present

## 2019-09-14 DIAGNOSIS — M4726 Other spondylosis with radiculopathy, lumbar region: Secondary | ICD-10-CM | POA: Diagnosis not present

## 2019-09-14 DIAGNOSIS — E7849 Other hyperlipidemia: Secondary | ICD-10-CM | POA: Diagnosis not present

## 2019-09-14 DIAGNOSIS — E6609 Other obesity due to excess calories: Secondary | ICD-10-CM | POA: Diagnosis not present

## 2019-09-14 DIAGNOSIS — E782 Mixed hyperlipidemia: Secondary | ICD-10-CM | POA: Diagnosis not present

## 2019-09-14 DIAGNOSIS — E039 Hypothyroidism, unspecified: Secondary | ICD-10-CM | POA: Diagnosis not present

## 2019-09-19 DIAGNOSIS — E039 Hypothyroidism, unspecified: Secondary | ICD-10-CM | POA: Diagnosis not present

## 2019-09-19 DIAGNOSIS — I482 Chronic atrial fibrillation, unspecified: Secondary | ICD-10-CM | POA: Diagnosis not present

## 2019-09-19 DIAGNOSIS — Z96651 Presence of right artificial knee joint: Secondary | ICD-10-CM | POA: Diagnosis not present

## 2019-09-19 DIAGNOSIS — G8929 Other chronic pain: Secondary | ICD-10-CM | POA: Diagnosis not present

## 2019-09-19 DIAGNOSIS — G9009 Other idiopathic peripheral autonomic neuropathy: Secondary | ICD-10-CM | POA: Diagnosis not present

## 2019-09-19 DIAGNOSIS — E782 Mixed hyperlipidemia: Secondary | ICD-10-CM | POA: Diagnosis not present

## 2019-09-19 DIAGNOSIS — D696 Thrombocytopenia, unspecified: Secondary | ICD-10-CM | POA: Diagnosis not present

## 2019-09-19 DIAGNOSIS — R0602 Shortness of breath: Secondary | ICD-10-CM | POA: Diagnosis not present

## 2019-09-19 DIAGNOSIS — M25562 Pain in left knee: Secondary | ICD-10-CM | POA: Diagnosis not present

## 2019-09-19 DIAGNOSIS — N400 Enlarged prostate without lower urinary tract symptoms: Secondary | ICD-10-CM | POA: Diagnosis not present

## 2019-10-19 DIAGNOSIS — G9009 Other idiopathic peripheral autonomic neuropathy: Secondary | ICD-10-CM | POA: Diagnosis not present

## 2019-10-19 DIAGNOSIS — D696 Thrombocytopenia, unspecified: Secondary | ICD-10-CM | POA: Diagnosis not present

## 2019-10-19 DIAGNOSIS — E6609 Other obesity due to excess calories: Secondary | ICD-10-CM | POA: Diagnosis not present

## 2019-10-19 DIAGNOSIS — H6123 Impacted cerumen, bilateral: Secondary | ICD-10-CM | POA: Diagnosis not present

## 2019-10-19 DIAGNOSIS — E7849 Other hyperlipidemia: Secondary | ICD-10-CM | POA: Diagnosis not present

## 2019-10-19 DIAGNOSIS — E039 Hypothyroidism, unspecified: Secondary | ICD-10-CM | POA: Diagnosis not present

## 2019-10-19 DIAGNOSIS — H6121 Impacted cerumen, right ear: Secondary | ICD-10-CM | POA: Diagnosis not present

## 2019-10-19 DIAGNOSIS — G250 Essential tremor: Secondary | ICD-10-CM | POA: Diagnosis not present

## 2019-10-19 DIAGNOSIS — G8929 Other chronic pain: Secondary | ICD-10-CM | POA: Diagnosis not present

## 2019-10-19 DIAGNOSIS — E782 Mixed hyperlipidemia: Secondary | ICD-10-CM | POA: Diagnosis not present

## 2019-11-10 DIAGNOSIS — I482 Chronic atrial fibrillation, unspecified: Secondary | ICD-10-CM | POA: Diagnosis not present

## 2019-11-10 DIAGNOSIS — Z96651 Presence of right artificial knee joint: Secondary | ICD-10-CM | POA: Diagnosis not present

## 2019-11-10 DIAGNOSIS — N400 Enlarged prostate without lower urinary tract symptoms: Secondary | ICD-10-CM | POA: Diagnosis not present

## 2019-11-10 DIAGNOSIS — G8929 Other chronic pain: Secondary | ICD-10-CM | POA: Diagnosis not present

## 2019-11-10 DIAGNOSIS — E039 Hypothyroidism, unspecified: Secondary | ICD-10-CM | POA: Diagnosis not present

## 2019-11-10 DIAGNOSIS — M25562 Pain in left knee: Secondary | ICD-10-CM | POA: Diagnosis not present

## 2019-11-10 DIAGNOSIS — G9009 Other idiopathic peripheral autonomic neuropathy: Secondary | ICD-10-CM | POA: Diagnosis not present

## 2019-11-10 DIAGNOSIS — R0602 Shortness of breath: Secondary | ICD-10-CM | POA: Diagnosis not present

## 2019-11-10 DIAGNOSIS — E782 Mixed hyperlipidemia: Secondary | ICD-10-CM | POA: Diagnosis not present

## 2019-11-10 DIAGNOSIS — D696 Thrombocytopenia, unspecified: Secondary | ICD-10-CM | POA: Diagnosis not present

## 2019-11-23 DIAGNOSIS — Z96651 Presence of right artificial knee joint: Secondary | ICD-10-CM | POA: Diagnosis not present

## 2019-11-23 DIAGNOSIS — H9202 Otalgia, left ear: Secondary | ICD-10-CM | POA: Diagnosis not present

## 2019-11-23 DIAGNOSIS — Z7901 Long term (current) use of anticoagulants: Secondary | ICD-10-CM | POA: Diagnosis not present

## 2019-11-23 DIAGNOSIS — N400 Enlarged prostate without lower urinary tract symptoms: Secondary | ICD-10-CM | POA: Diagnosis not present

## 2019-11-23 DIAGNOSIS — R0602 Shortness of breath: Secondary | ICD-10-CM | POA: Diagnosis not present

## 2019-11-23 DIAGNOSIS — E6609 Other obesity due to excess calories: Secondary | ICD-10-CM | POA: Diagnosis not present

## 2019-11-23 DIAGNOSIS — E782 Mixed hyperlipidemia: Secondary | ICD-10-CM | POA: Diagnosis not present

## 2019-11-23 DIAGNOSIS — M25562 Pain in left knee: Secondary | ICD-10-CM | POA: Diagnosis not present

## 2019-11-23 DIAGNOSIS — I482 Chronic atrial fibrillation, unspecified: Secondary | ICD-10-CM | POA: Diagnosis not present

## 2019-11-23 DIAGNOSIS — G9009 Other idiopathic peripheral autonomic neuropathy: Secondary | ICD-10-CM | POA: Diagnosis not present

## 2019-11-23 DIAGNOSIS — D696 Thrombocytopenia, unspecified: Secondary | ICD-10-CM | POA: Diagnosis not present

## 2019-11-23 DIAGNOSIS — J309 Allergic rhinitis, unspecified: Secondary | ICD-10-CM | POA: Diagnosis not present

## 2019-11-23 DIAGNOSIS — Z712 Person consulting for explanation of examination or test findings: Secondary | ICD-10-CM | POA: Diagnosis not present

## 2019-11-23 DIAGNOSIS — Z8616 Personal history of COVID-19: Secondary | ICD-10-CM | POA: Diagnosis not present

## 2019-11-23 DIAGNOSIS — E039 Hypothyroidism, unspecified: Secondary | ICD-10-CM | POA: Diagnosis not present

## 2019-11-23 DIAGNOSIS — M79669 Pain in unspecified lower leg: Secondary | ICD-10-CM | POA: Diagnosis not present

## 2019-11-23 DIAGNOSIS — G8929 Other chronic pain: Secondary | ICD-10-CM | POA: Diagnosis not present

## 2019-11-23 DIAGNOSIS — Z6836 Body mass index (BMI) 36.0-36.9, adult: Secondary | ICD-10-CM | POA: Diagnosis not present

## 2019-11-23 DIAGNOSIS — R0981 Nasal congestion: Secondary | ICD-10-CM | POA: Diagnosis not present

## 2019-12-08 DIAGNOSIS — G8929 Other chronic pain: Secondary | ICD-10-CM | POA: Diagnosis not present

## 2019-12-08 DIAGNOSIS — E782 Mixed hyperlipidemia: Secondary | ICD-10-CM | POA: Diagnosis not present

## 2019-12-08 DIAGNOSIS — I482 Chronic atrial fibrillation, unspecified: Secondary | ICD-10-CM | POA: Diagnosis not present

## 2019-12-08 DIAGNOSIS — M109 Gout, unspecified: Secondary | ICD-10-CM | POA: Diagnosis not present

## 2019-12-08 DIAGNOSIS — E039 Hypothyroidism, unspecified: Secondary | ICD-10-CM | POA: Diagnosis not present

## 2019-12-12 DIAGNOSIS — I10 Essential (primary) hypertension: Secondary | ICD-10-CM | POA: Diagnosis not present

## 2019-12-12 DIAGNOSIS — Z6838 Body mass index (BMI) 38.0-38.9, adult: Secondary | ICD-10-CM | POA: Diagnosis not present

## 2019-12-12 DIAGNOSIS — M4726 Other spondylosis with radiculopathy, lumbar region: Secondary | ICD-10-CM | POA: Diagnosis not present

## 2020-01-09 DIAGNOSIS — R69 Illness, unspecified: Secondary | ICD-10-CM | POA: Diagnosis not present

## 2020-01-09 DIAGNOSIS — D509 Iron deficiency anemia, unspecified: Secondary | ICD-10-CM | POA: Diagnosis not present

## 2020-01-09 DIAGNOSIS — R944 Abnormal results of kidney function studies: Secondary | ICD-10-CM | POA: Diagnosis not present

## 2020-01-13 DIAGNOSIS — I482 Chronic atrial fibrillation, unspecified: Secondary | ICD-10-CM | POA: Diagnosis not present

## 2020-01-13 DIAGNOSIS — G8929 Other chronic pain: Secondary | ICD-10-CM | POA: Diagnosis not present

## 2020-01-13 DIAGNOSIS — E782 Mixed hyperlipidemia: Secondary | ICD-10-CM | POA: Diagnosis not present

## 2020-01-13 DIAGNOSIS — M109 Gout, unspecified: Secondary | ICD-10-CM | POA: Diagnosis not present

## 2020-01-13 DIAGNOSIS — E039 Hypothyroidism, unspecified: Secondary | ICD-10-CM | POA: Diagnosis not present

## 2020-01-19 DIAGNOSIS — R0600 Dyspnea, unspecified: Secondary | ICD-10-CM | POA: Diagnosis not present

## 2020-01-19 DIAGNOSIS — G4733 Obstructive sleep apnea (adult) (pediatric): Secondary | ICD-10-CM | POA: Diagnosis not present

## 2020-01-19 DIAGNOSIS — I2510 Atherosclerotic heart disease of native coronary artery without angina pectoris: Secondary | ICD-10-CM | POA: Diagnosis not present

## 2020-01-19 DIAGNOSIS — R600 Localized edema: Secondary | ICD-10-CM | POA: Diagnosis not present

## 2020-01-19 DIAGNOSIS — R0609 Other forms of dyspnea: Secondary | ICD-10-CM | POA: Diagnosis not present

## 2020-01-19 DIAGNOSIS — I480 Paroxysmal atrial fibrillation: Secondary | ICD-10-CM | POA: Diagnosis not present

## 2020-01-19 DIAGNOSIS — I110 Hypertensive heart disease with heart failure: Secondary | ICD-10-CM | POA: Diagnosis not present

## 2020-01-19 DIAGNOSIS — E785 Hyperlipidemia, unspecified: Secondary | ICD-10-CM | POA: Diagnosis not present

## 2020-01-19 DIAGNOSIS — I25119 Atherosclerotic heart disease of native coronary artery with unspecified angina pectoris: Secondary | ICD-10-CM | POA: Diagnosis not present

## 2020-01-19 DIAGNOSIS — R002 Palpitations: Secondary | ICD-10-CM | POA: Diagnosis not present

## 2020-01-19 DIAGNOSIS — R0602 Shortness of breath: Secondary | ICD-10-CM | POA: Diagnosis not present

## 2020-01-19 DIAGNOSIS — Z23 Encounter for immunization: Secondary | ICD-10-CM | POA: Diagnosis not present

## 2020-01-19 DIAGNOSIS — I5032 Chronic diastolic (congestive) heart failure: Secondary | ICD-10-CM | POA: Diagnosis not present

## 2020-01-19 DIAGNOSIS — I5030 Unspecified diastolic (congestive) heart failure: Secondary | ICD-10-CM | POA: Diagnosis not present

## 2020-02-17 DIAGNOSIS — R55 Syncope and collapse: Secondary | ICD-10-CM | POA: Diagnosis not present

## 2020-02-17 DIAGNOSIS — I472 Ventricular tachycardia: Secondary | ICD-10-CM | POA: Diagnosis not present

## 2020-02-17 DIAGNOSIS — I10 Essential (primary) hypertension: Secondary | ICD-10-CM | POA: Diagnosis not present

## 2020-02-17 DIAGNOSIS — E785 Hyperlipidemia, unspecified: Secondary | ICD-10-CM | POA: Diagnosis not present

## 2020-02-17 DIAGNOSIS — I5032 Chronic diastolic (congestive) heart failure: Secondary | ICD-10-CM | POA: Diagnosis not present

## 2020-02-17 DIAGNOSIS — I471 Supraventricular tachycardia: Secondary | ICD-10-CM | POA: Diagnosis not present

## 2020-02-17 DIAGNOSIS — I25119 Atherosclerotic heart disease of native coronary artery with unspecified angina pectoris: Secondary | ICD-10-CM | POA: Diagnosis not present

## 2020-02-17 DIAGNOSIS — I480 Paroxysmal atrial fibrillation: Secondary | ICD-10-CM | POA: Diagnosis not present

## 2020-03-02 DIAGNOSIS — Z712 Person consulting for explanation of examination or test findings: Secondary | ICD-10-CM | POA: Diagnosis not present

## 2020-03-02 DIAGNOSIS — M109 Gout, unspecified: Secondary | ICD-10-CM | POA: Diagnosis not present

## 2020-03-02 DIAGNOSIS — H9202 Otalgia, left ear: Secondary | ICD-10-CM | POA: Diagnosis not present

## 2020-03-02 DIAGNOSIS — Z8616 Personal history of COVID-19: Secondary | ICD-10-CM | POA: Diagnosis not present

## 2020-03-02 DIAGNOSIS — E6609 Other obesity due to excess calories: Secondary | ICD-10-CM | POA: Diagnosis not present

## 2020-03-02 DIAGNOSIS — Z6836 Body mass index (BMI) 36.0-36.9, adult: Secondary | ICD-10-CM | POA: Diagnosis not present

## 2020-03-02 DIAGNOSIS — R0981 Nasal congestion: Secondary | ICD-10-CM | POA: Diagnosis not present

## 2020-03-02 DIAGNOSIS — Z7901 Long term (current) use of anticoagulants: Secondary | ICD-10-CM | POA: Diagnosis not present

## 2020-03-02 DIAGNOSIS — G8929 Other chronic pain: Secondary | ICD-10-CM | POA: Diagnosis not present

## 2020-03-02 DIAGNOSIS — J309 Allergic rhinitis, unspecified: Secondary | ICD-10-CM | POA: Diagnosis not present

## 2020-03-02 DIAGNOSIS — I480 Paroxysmal atrial fibrillation: Secondary | ICD-10-CM | POA: Diagnosis not present

## 2020-03-02 DIAGNOSIS — M79669 Pain in unspecified lower leg: Secondary | ICD-10-CM | POA: Diagnosis not present

## 2020-03-02 DIAGNOSIS — E782 Mixed hyperlipidemia: Secondary | ICD-10-CM | POA: Diagnosis not present

## 2020-03-02 DIAGNOSIS — N400 Enlarged prostate without lower urinary tract symptoms: Secondary | ICD-10-CM | POA: Diagnosis not present

## 2020-03-02 DIAGNOSIS — J449 Chronic obstructive pulmonary disease, unspecified: Secondary | ICD-10-CM | POA: Diagnosis not present

## 2020-03-02 DIAGNOSIS — I482 Chronic atrial fibrillation, unspecified: Secondary | ICD-10-CM | POA: Diagnosis not present

## 2020-03-02 DIAGNOSIS — E039 Hypothyroidism, unspecified: Secondary | ICD-10-CM | POA: Diagnosis not present

## 2020-03-04 DIAGNOSIS — R55 Syncope and collapse: Secondary | ICD-10-CM | POA: Diagnosis not present

## 2020-03-07 DIAGNOSIS — R0602 Shortness of breath: Secondary | ICD-10-CM | POA: Diagnosis not present

## 2020-03-07 DIAGNOSIS — I482 Chronic atrial fibrillation, unspecified: Secondary | ICD-10-CM | POA: Diagnosis not present

## 2020-03-07 DIAGNOSIS — N400 Enlarged prostate without lower urinary tract symptoms: Secondary | ICD-10-CM | POA: Diagnosis not present

## 2020-03-07 DIAGNOSIS — M4726 Other spondylosis with radiculopathy, lumbar region: Secondary | ICD-10-CM | POA: Diagnosis not present

## 2020-03-07 DIAGNOSIS — Z23 Encounter for immunization: Secondary | ICD-10-CM | POA: Diagnosis not present

## 2020-03-07 DIAGNOSIS — E039 Hypothyroidism, unspecified: Secondary | ICD-10-CM | POA: Diagnosis not present

## 2020-03-07 DIAGNOSIS — K219 Gastro-esophageal reflux disease without esophagitis: Secondary | ICD-10-CM | POA: Diagnosis not present

## 2020-03-07 DIAGNOSIS — E7849 Other hyperlipidemia: Secondary | ICD-10-CM | POA: Diagnosis not present

## 2020-03-07 DIAGNOSIS — M4317 Spondylolisthesis, lumbosacral region: Secondary | ICD-10-CM | POA: Diagnosis not present

## 2020-03-07 DIAGNOSIS — G9009 Other idiopathic peripheral autonomic neuropathy: Secondary | ICD-10-CM | POA: Diagnosis not present

## 2020-03-07 DIAGNOSIS — E782 Mixed hyperlipidemia: Secondary | ICD-10-CM | POA: Diagnosis not present

## 2020-03-07 DIAGNOSIS — D696 Thrombocytopenia, unspecified: Secondary | ICD-10-CM | POA: Diagnosis not present

## 2020-03-07 DIAGNOSIS — Z0001 Encounter for general adult medical examination with abnormal findings: Secondary | ICD-10-CM | POA: Diagnosis not present

## 2020-03-07 DIAGNOSIS — M25562 Pain in left knee: Secondary | ICD-10-CM | POA: Diagnosis not present

## 2020-03-07 DIAGNOSIS — G8929 Other chronic pain: Secondary | ICD-10-CM | POA: Diagnosis not present

## 2020-03-28 DIAGNOSIS — N5201 Erectile dysfunction due to arterial insufficiency: Secondary | ICD-10-CM | POA: Diagnosis not present

## 2020-03-28 DIAGNOSIS — Z87442 Personal history of urinary calculi: Secondary | ICD-10-CM | POA: Diagnosis not present

## 2020-03-28 DIAGNOSIS — R972 Elevated prostate specific antigen [PSA]: Secondary | ICD-10-CM | POA: Diagnosis not present

## 2020-03-30 ENCOUNTER — Ambulatory Visit: Payer: Medicare HMO | Admitting: Urology

## 2020-05-26 DIAGNOSIS — J449 Chronic obstructive pulmonary disease, unspecified: Secondary | ICD-10-CM | POA: Diagnosis not present

## 2020-05-26 DIAGNOSIS — E7849 Other hyperlipidemia: Secondary | ICD-10-CM | POA: Diagnosis not present

## 2020-05-26 DIAGNOSIS — E039 Hypothyroidism, unspecified: Secondary | ICD-10-CM | POA: Diagnosis not present

## 2020-05-30 DIAGNOSIS — M5136 Other intervertebral disc degeneration, lumbar region: Secondary | ICD-10-CM | POA: Diagnosis not present

## 2020-05-30 DIAGNOSIS — M48061 Spinal stenosis, lumbar region without neurogenic claudication: Secondary | ICD-10-CM | POA: Diagnosis not present

## 2020-06-04 DIAGNOSIS — Z08 Encounter for follow-up examination after completed treatment for malignant neoplasm: Secondary | ICD-10-CM | POA: Diagnosis not present

## 2020-06-04 DIAGNOSIS — L918 Other hypertrophic disorders of the skin: Secondary | ICD-10-CM | POA: Diagnosis not present

## 2020-06-04 DIAGNOSIS — L0889 Other specified local infections of the skin and subcutaneous tissue: Secondary | ICD-10-CM | POA: Diagnosis not present

## 2020-06-04 DIAGNOSIS — Z1283 Encounter for screening for malignant neoplasm of skin: Secondary | ICD-10-CM | POA: Diagnosis not present

## 2020-06-04 DIAGNOSIS — L821 Other seborrheic keratosis: Secondary | ICD-10-CM | POA: Diagnosis not present

## 2020-06-04 DIAGNOSIS — L814 Other melanin hyperpigmentation: Secondary | ICD-10-CM | POA: Diagnosis not present

## 2020-06-04 DIAGNOSIS — D2361 Other benign neoplasm of skin of right upper limb, including shoulder: Secondary | ICD-10-CM | POA: Diagnosis not present

## 2020-06-04 DIAGNOSIS — Z85828 Personal history of other malignant neoplasm of skin: Secondary | ICD-10-CM | POA: Diagnosis not present

## 2020-06-27 DIAGNOSIS — I110 Hypertensive heart disease with heart failure: Secondary | ICD-10-CM | POA: Diagnosis not present

## 2020-06-27 DIAGNOSIS — D6869 Other thrombophilia: Secondary | ICD-10-CM | POA: Diagnosis not present

## 2020-06-27 DIAGNOSIS — I25119 Atherosclerotic heart disease of native coronary artery with unspecified angina pectoris: Secondary | ICD-10-CM | POA: Diagnosis not present

## 2020-06-27 DIAGNOSIS — G629 Polyneuropathy, unspecified: Secondary | ICD-10-CM | POA: Diagnosis not present

## 2020-06-27 DIAGNOSIS — I4891 Unspecified atrial fibrillation: Secondary | ICD-10-CM | POA: Diagnosis not present

## 2020-06-27 DIAGNOSIS — G8929 Other chronic pain: Secondary | ICD-10-CM | POA: Diagnosis not present

## 2020-06-27 DIAGNOSIS — E039 Hypothyroidism, unspecified: Secondary | ICD-10-CM | POA: Diagnosis not present

## 2020-06-27 DIAGNOSIS — I509 Heart failure, unspecified: Secondary | ICD-10-CM | POA: Diagnosis not present

## 2020-06-27 DIAGNOSIS — J309 Allergic rhinitis, unspecified: Secondary | ICD-10-CM | POA: Diagnosis not present

## 2020-06-28 DIAGNOSIS — N5201 Erectile dysfunction due to arterial insufficiency: Secondary | ICD-10-CM | POA: Diagnosis not present

## 2020-07-04 DIAGNOSIS — M5441 Lumbago with sciatica, right side: Secondary | ICD-10-CM | POA: Diagnosis not present

## 2020-07-04 DIAGNOSIS — R202 Paresthesia of skin: Secondary | ICD-10-CM | POA: Diagnosis not present

## 2020-07-04 DIAGNOSIS — M5442 Lumbago with sciatica, left side: Secondary | ICD-10-CM | POA: Diagnosis not present

## 2020-07-25 DIAGNOSIS — I5032 Chronic diastolic (congestive) heart failure: Secondary | ICD-10-CM | POA: Diagnosis not present

## 2020-07-25 DIAGNOSIS — J449 Chronic obstructive pulmonary disease, unspecified: Secondary | ICD-10-CM | POA: Diagnosis not present

## 2020-07-25 DIAGNOSIS — Z9889 Other specified postprocedural states: Secondary | ICD-10-CM | POA: Diagnosis not present

## 2020-07-25 DIAGNOSIS — I471 Supraventricular tachycardia: Secondary | ICD-10-CM | POA: Diagnosis not present

## 2020-07-25 DIAGNOSIS — I480 Paroxysmal atrial fibrillation: Secondary | ICD-10-CM | POA: Diagnosis not present

## 2020-07-25 DIAGNOSIS — E7849 Other hyperlipidemia: Secondary | ICD-10-CM | POA: Diagnosis not present

## 2020-07-25 DIAGNOSIS — I472 Ventricular tachycardia: Secondary | ICD-10-CM | POA: Diagnosis not present

## 2020-07-25 DIAGNOSIS — I25119 Atherosclerotic heart disease of native coronary artery with unspecified angina pectoris: Secondary | ICD-10-CM | POA: Diagnosis not present

## 2020-07-25 DIAGNOSIS — I10 Essential (primary) hypertension: Secondary | ICD-10-CM | POA: Diagnosis not present

## 2020-07-25 DIAGNOSIS — E039 Hypothyroidism, unspecified: Secondary | ICD-10-CM | POA: Diagnosis not present

## 2020-07-25 DIAGNOSIS — Z8679 Personal history of other diseases of the circulatory system: Secondary | ICD-10-CM | POA: Diagnosis not present

## 2020-08-23 DIAGNOSIS — F1120 Opioid dependence, uncomplicated: Secondary | ICD-10-CM | POA: Diagnosis not present

## 2020-08-23 DIAGNOSIS — M48061 Spinal stenosis, lumbar region without neurogenic claudication: Secondary | ICD-10-CM | POA: Diagnosis not present

## 2020-08-23 DIAGNOSIS — R69 Illness, unspecified: Secondary | ICD-10-CM | POA: Diagnosis not present

## 2020-08-23 DIAGNOSIS — M5136 Other intervertebral disc degeneration, lumbar region: Secondary | ICD-10-CM | POA: Diagnosis not present

## 2020-08-26 DIAGNOSIS — I482 Chronic atrial fibrillation, unspecified: Secondary | ICD-10-CM | POA: Diagnosis not present

## 2020-08-26 DIAGNOSIS — D696 Thrombocytopenia, unspecified: Secondary | ICD-10-CM | POA: Diagnosis not present

## 2020-08-26 DIAGNOSIS — E039 Hypothyroidism, unspecified: Secondary | ICD-10-CM | POA: Diagnosis not present

## 2020-08-26 DIAGNOSIS — G629 Polyneuropathy, unspecified: Secondary | ICD-10-CM | POA: Diagnosis not present

## 2020-08-26 DIAGNOSIS — I739 Peripheral vascular disease, unspecified: Secondary | ICD-10-CM | POA: Diagnosis not present

## 2020-08-26 DIAGNOSIS — K750 Abscess of liver: Secondary | ICD-10-CM | POA: Diagnosis not present

## 2020-08-26 DIAGNOSIS — J309 Allergic rhinitis, unspecified: Secondary | ICD-10-CM | POA: Diagnosis not present

## 2020-08-26 DIAGNOSIS — M25562 Pain in left knee: Secondary | ICD-10-CM | POA: Diagnosis not present

## 2020-08-26 DIAGNOSIS — Z96651 Presence of right artificial knee joint: Secondary | ICD-10-CM | POA: Diagnosis not present

## 2020-08-26 DIAGNOSIS — I10 Essential (primary) hypertension: Secondary | ICD-10-CM | POA: Diagnosis not present

## 2020-08-26 DIAGNOSIS — R0600 Dyspnea, unspecified: Secondary | ICD-10-CM | POA: Diagnosis not present

## 2020-08-26 DIAGNOSIS — E782 Mixed hyperlipidemia: Secondary | ICD-10-CM | POA: Diagnosis not present

## 2020-09-07 DIAGNOSIS — R972 Elevated prostate specific antigen [PSA]: Secondary | ICD-10-CM | POA: Diagnosis not present

## 2020-09-07 DIAGNOSIS — E782 Mixed hyperlipidemia: Secondary | ICD-10-CM | POA: Diagnosis not present

## 2020-09-07 DIAGNOSIS — E039 Hypothyroidism, unspecified: Secondary | ICD-10-CM | POA: Diagnosis not present

## 2020-09-07 DIAGNOSIS — Z131 Encounter for screening for diabetes mellitus: Secondary | ICD-10-CM | POA: Diagnosis not present

## 2020-09-07 DIAGNOSIS — Z0000 Encounter for general adult medical examination without abnormal findings: Secondary | ICD-10-CM | POA: Diagnosis not present

## 2020-09-07 DIAGNOSIS — I10 Essential (primary) hypertension: Secondary | ICD-10-CM | POA: Diagnosis not present

## 2020-09-20 DIAGNOSIS — M25562 Pain in left knee: Secondary | ICD-10-CM | POA: Diagnosis not present

## 2020-09-20 DIAGNOSIS — R0600 Dyspnea, unspecified: Secondary | ICD-10-CM | POA: Diagnosis not present

## 2020-09-20 DIAGNOSIS — E039 Hypothyroidism, unspecified: Secondary | ICD-10-CM | POA: Diagnosis not present

## 2020-09-20 DIAGNOSIS — I739 Peripheral vascular disease, unspecified: Secondary | ICD-10-CM | POA: Diagnosis not present

## 2020-09-20 DIAGNOSIS — G629 Polyneuropathy, unspecified: Secondary | ICD-10-CM | POA: Diagnosis not present

## 2020-09-20 DIAGNOSIS — I482 Chronic atrial fibrillation, unspecified: Secondary | ICD-10-CM | POA: Diagnosis not present

## 2020-09-20 DIAGNOSIS — K750 Abscess of liver: Secondary | ICD-10-CM | POA: Diagnosis not present

## 2020-09-20 DIAGNOSIS — Z96651 Presence of right artificial knee joint: Secondary | ICD-10-CM | POA: Diagnosis not present

## 2020-09-20 DIAGNOSIS — D696 Thrombocytopenia, unspecified: Secondary | ICD-10-CM | POA: Diagnosis not present

## 2020-09-20 DIAGNOSIS — E782 Mixed hyperlipidemia: Secondary | ICD-10-CM | POA: Diagnosis not present

## 2020-09-26 DIAGNOSIS — L538 Other specified erythematous conditions: Secondary | ICD-10-CM | POA: Diagnosis not present

## 2020-09-26 DIAGNOSIS — R58 Hemorrhage, not elsewhere classified: Secondary | ICD-10-CM | POA: Diagnosis not present

## 2020-09-26 DIAGNOSIS — D235 Other benign neoplasm of skin of trunk: Secondary | ICD-10-CM | POA: Diagnosis not present

## 2020-09-26 DIAGNOSIS — Z789 Other specified health status: Secondary | ICD-10-CM | POA: Diagnosis not present

## 2020-09-26 DIAGNOSIS — Z85828 Personal history of other malignant neoplasm of skin: Secondary | ICD-10-CM | POA: Diagnosis not present

## 2020-09-26 DIAGNOSIS — Z08 Encounter for follow-up examination after completed treatment for malignant neoplasm: Secondary | ICD-10-CM | POA: Diagnosis not present

## 2020-09-26 DIAGNOSIS — L821 Other seborrheic keratosis: Secondary | ICD-10-CM | POA: Diagnosis not present

## 2020-09-26 DIAGNOSIS — L298 Other pruritus: Secondary | ICD-10-CM | POA: Diagnosis not present

## 2020-09-26 DIAGNOSIS — R208 Other disturbances of skin sensation: Secondary | ICD-10-CM | POA: Diagnosis not present

## 2020-09-28 DIAGNOSIS — N5201 Erectile dysfunction due to arterial insufficiency: Secondary | ICD-10-CM | POA: Diagnosis not present

## 2020-10-25 DIAGNOSIS — I10 Essential (primary) hypertension: Secondary | ICD-10-CM | POA: Diagnosis not present

## 2020-10-25 DIAGNOSIS — J309 Allergic rhinitis, unspecified: Secondary | ICD-10-CM | POA: Diagnosis not present

## 2020-11-08 DIAGNOSIS — R69 Illness, unspecified: Secondary | ICD-10-CM | POA: Diagnosis not present

## 2020-11-08 DIAGNOSIS — Z6841 Body Mass Index (BMI) 40.0 and over, adult: Secondary | ICD-10-CM | POA: Diagnosis not present

## 2020-11-08 DIAGNOSIS — M5136 Other intervertebral disc degeneration, lumbar region: Secondary | ICD-10-CM | POA: Diagnosis not present

## 2020-11-08 DIAGNOSIS — I10 Essential (primary) hypertension: Secondary | ICD-10-CM | POA: Diagnosis not present

## 2020-11-08 DIAGNOSIS — M48061 Spinal stenosis, lumbar region without neurogenic claudication: Secondary | ICD-10-CM | POA: Diagnosis not present

## 2020-11-12 DIAGNOSIS — R69 Illness, unspecified: Secondary | ICD-10-CM | POA: Diagnosis not present

## 2020-11-30 DIAGNOSIS — Z96651 Presence of right artificial knee joint: Secondary | ICD-10-CM | POA: Diagnosis not present

## 2020-11-30 DIAGNOSIS — M1712 Unilateral primary osteoarthritis, left knee: Secondary | ICD-10-CM | POA: Diagnosis not present

## 2020-12-06 DIAGNOSIS — I5030 Unspecified diastolic (congestive) heart failure: Secondary | ICD-10-CM | POA: Diagnosis not present

## 2020-12-06 DIAGNOSIS — E785 Hyperlipidemia, unspecified: Secondary | ICD-10-CM | POA: Diagnosis not present

## 2020-12-06 DIAGNOSIS — I10 Essential (primary) hypertension: Secondary | ICD-10-CM | POA: Diagnosis not present

## 2020-12-06 DIAGNOSIS — I2510 Atherosclerotic heart disease of native coronary artery without angina pectoris: Secondary | ICD-10-CM | POA: Diagnosis not present

## 2020-12-06 DIAGNOSIS — R0609 Other forms of dyspnea: Secondary | ICD-10-CM | POA: Diagnosis not present

## 2020-12-06 DIAGNOSIS — I110 Hypertensive heart disease with heart failure: Secondary | ICD-10-CM | POA: Diagnosis not present

## 2020-12-06 DIAGNOSIS — Z7901 Long term (current) use of anticoagulants: Secondary | ICD-10-CM | POA: Diagnosis not present

## 2020-12-06 DIAGNOSIS — I480 Paroxysmal atrial fibrillation: Secondary | ICD-10-CM | POA: Diagnosis not present

## 2020-12-06 DIAGNOSIS — I25119 Atherosclerotic heart disease of native coronary artery with unspecified angina pectoris: Secondary | ICD-10-CM | POA: Diagnosis not present

## 2020-12-24 ENCOUNTER — Other Ambulatory Visit: Payer: Self-pay | Admitting: Neurosurgery

## 2020-12-24 ENCOUNTER — Other Ambulatory Visit (HOSPITAL_COMMUNITY): Payer: Self-pay | Admitting: Neurosurgery

## 2020-12-24 DIAGNOSIS — M5136 Other intervertebral disc degeneration, lumbar region: Secondary | ICD-10-CM

## 2020-12-26 DIAGNOSIS — I10 Essential (primary) hypertension: Secondary | ICD-10-CM | POA: Diagnosis not present

## 2020-12-26 DIAGNOSIS — J309 Allergic rhinitis, unspecified: Secondary | ICD-10-CM | POA: Diagnosis not present

## 2021-01-02 ENCOUNTER — Other Ambulatory Visit: Payer: Self-pay

## 2021-01-02 ENCOUNTER — Ambulatory Visit (HOSPITAL_COMMUNITY)
Admission: RE | Admit: 2021-01-02 | Discharge: 2021-01-02 | Disposition: A | Discharge status: Home / Self Care | Payer: Medicare HMO | Source: Ambulatory Visit | Attending: Neurosurgery | Admitting: Neurosurgery

## 2021-01-02 DIAGNOSIS — M5126 Other intervertebral disc displacement, lumbar region: Secondary | ICD-10-CM | POA: Diagnosis not present

## 2021-01-02 DIAGNOSIS — M48061 Spinal stenosis, lumbar region without neurogenic claudication: Secondary | ICD-10-CM | POA: Diagnosis not present

## 2021-01-02 DIAGNOSIS — M5136 Other intervertebral disc degeneration, lumbar region: Secondary | ICD-10-CM | POA: Insufficient documentation

## 2021-01-02 DIAGNOSIS — M47817 Spondylosis without myelopathy or radiculopathy, lumbosacral region: Secondary | ICD-10-CM | POA: Diagnosis not present

## 2021-01-02 DIAGNOSIS — M5127 Other intervertebral disc displacement, lumbosacral region: Secondary | ICD-10-CM | POA: Diagnosis not present

## 2021-01-08 DIAGNOSIS — M545 Low back pain, unspecified: Secondary | ICD-10-CM | POA: Diagnosis not present

## 2021-01-08 DIAGNOSIS — R1031 Right lower quadrant pain: Secondary | ICD-10-CM | POA: Diagnosis not present

## 2021-01-09 DIAGNOSIS — I25119 Atherosclerotic heart disease of native coronary artery with unspecified angina pectoris: Secondary | ICD-10-CM | POA: Diagnosis not present

## 2021-01-09 DIAGNOSIS — R0609 Other forms of dyspnea: Secondary | ICD-10-CM | POA: Diagnosis not present

## 2021-01-09 DIAGNOSIS — R9439 Abnormal result of other cardiovascular function study: Secondary | ICD-10-CM | POA: Diagnosis not present

## 2021-01-16 DIAGNOSIS — M5136 Other intervertebral disc degeneration, lumbar region: Secondary | ICD-10-CM | POA: Diagnosis not present

## 2021-01-16 DIAGNOSIS — M48061 Spinal stenosis, lumbar region without neurogenic claudication: Secondary | ICD-10-CM | POA: Diagnosis not present

## 2021-01-16 DIAGNOSIS — R69 Illness, unspecified: Secondary | ICD-10-CM | POA: Diagnosis not present

## 2021-01-16 DIAGNOSIS — M1712 Unilateral primary osteoarthritis, left knee: Secondary | ICD-10-CM | POA: Diagnosis not present

## 2021-01-25 DIAGNOSIS — J309 Allergic rhinitis, unspecified: Secondary | ICD-10-CM | POA: Diagnosis not present

## 2021-01-25 DIAGNOSIS — I10 Essential (primary) hypertension: Secondary | ICD-10-CM | POA: Diagnosis not present

## 2021-02-04 DIAGNOSIS — E785 Hyperlipidemia, unspecified: Secondary | ICD-10-CM | POA: Diagnosis not present

## 2021-02-04 DIAGNOSIS — I480 Paroxysmal atrial fibrillation: Secondary | ICD-10-CM | POA: Diagnosis not present

## 2021-02-04 DIAGNOSIS — R9439 Abnormal result of other cardiovascular function study: Secondary | ICD-10-CM | POA: Diagnosis not present

## 2021-02-04 DIAGNOSIS — I471 Supraventricular tachycardia: Secondary | ICD-10-CM | POA: Diagnosis not present

## 2021-02-04 DIAGNOSIS — I2510 Atherosclerotic heart disease of native coronary artery without angina pectoris: Secondary | ICD-10-CM | POA: Diagnosis not present

## 2021-02-04 DIAGNOSIS — I110 Hypertensive heart disease with heart failure: Secondary | ICD-10-CM | POA: Diagnosis not present

## 2021-02-04 DIAGNOSIS — Z87891 Personal history of nicotine dependence: Secondary | ICD-10-CM | POA: Diagnosis not present

## 2021-02-04 DIAGNOSIS — I25118 Atherosclerotic heart disease of native coronary artery with other forms of angina pectoris: Secondary | ICD-10-CM | POA: Diagnosis not present

## 2021-02-04 DIAGNOSIS — I708 Atherosclerosis of other arteries: Secondary | ICD-10-CM | POA: Diagnosis not present

## 2021-02-04 DIAGNOSIS — I25119 Atherosclerotic heart disease of native coronary artery with unspecified angina pectoris: Secondary | ICD-10-CM | POA: Diagnosis not present

## 2021-02-04 DIAGNOSIS — R079 Chest pain, unspecified: Secondary | ICD-10-CM | POA: Diagnosis not present

## 2021-02-04 DIAGNOSIS — I472 Ventricular tachycardia, unspecified: Secondary | ICD-10-CM | POA: Diagnosis not present

## 2021-02-04 DIAGNOSIS — I509 Heart failure, unspecified: Secondary | ICD-10-CM | POA: Diagnosis not present

## 2021-02-04 DIAGNOSIS — I5032 Chronic diastolic (congestive) heart failure: Secondary | ICD-10-CM | POA: Diagnosis not present

## 2021-02-04 DIAGNOSIS — Z8616 Personal history of COVID-19: Secondary | ICD-10-CM | POA: Diagnosis not present

## 2021-02-07 DIAGNOSIS — E782 Mixed hyperlipidemia: Secondary | ICD-10-CM | POA: Diagnosis not present

## 2021-02-07 DIAGNOSIS — R0600 Dyspnea, unspecified: Secondary | ICD-10-CM | POA: Diagnosis not present

## 2021-02-07 DIAGNOSIS — I482 Chronic atrial fibrillation, unspecified: Secondary | ICD-10-CM | POA: Diagnosis not present

## 2021-02-07 DIAGNOSIS — I714 Abdominal aortic aneurysm, without rupture, unspecified: Secondary | ICD-10-CM | POA: Diagnosis not present

## 2021-02-07 DIAGNOSIS — Z01818 Encounter for other preprocedural examination: Secondary | ICD-10-CM | POA: Diagnosis not present

## 2021-02-07 DIAGNOSIS — Z0000 Encounter for general adult medical examination without abnormal findings: Secondary | ICD-10-CM | POA: Diagnosis not present

## 2021-02-07 DIAGNOSIS — Z9889 Other specified postprocedural states: Secondary | ICD-10-CM | POA: Diagnosis not present

## 2021-02-07 DIAGNOSIS — M25562 Pain in left knee: Secondary | ICD-10-CM | POA: Diagnosis not present

## 2021-02-07 DIAGNOSIS — R7309 Other abnormal glucose: Secondary | ICD-10-CM | POA: Diagnosis not present

## 2021-02-25 DIAGNOSIS — I10 Essential (primary) hypertension: Secondary | ICD-10-CM | POA: Diagnosis not present

## 2021-02-25 DIAGNOSIS — J309 Allergic rhinitis, unspecified: Secondary | ICD-10-CM | POA: Diagnosis not present

## 2021-02-26 NOTE — Patient Instructions (Signed)
DUE TO COVID-19 ONLY ONE VISITOR IS ALLOWED TO COME WITH YOU AND STAY IN THE WAITING ROOM ONLY DURING PRE OP AND PROCEDURE.   **NO VISITORS ARE ALLOWED IN THE SHORT STAY AREA OR RECOVERY ROOM!!**  IF YOU WILL BE ADMITTED INTO THE HOSPITAL YOU ARE ALLOWED ONLY TWO SUPPORT PEOPLE DURING VISITATION HOURS ONLY (7 AM -8PM)   The support person(s) must pass our screening, gel in and out, and wear a mask at all times, including in the patient's room. Patients must also wear a mask when staff or their support person are in the room. Visitors GUEST BADGE MUST BE WORN VISIBLY  One adult visitor may remain with you overnight and MUST be in the room by 8 P.M.  No visitors under the age of 27. Any visitor under the age of 84 must be accompanied by an adult.    COVID SWAB TESTING MUST BE COMPLETED ON: 03/03/21                 8A - 3P **MUST PRESENT COMPLETED FORM AT TESTING SITE**    [address] [place] (backside of the building) You are not required to quarantine, however you are required to wear a well-fitted mask when you are out and around people not in your household.  Hand Hygiene often Do NOT share personal items Notify your provider if you are in close contact with someone who has COVID or you develop fever 100.4 or greater, new onset of sneezing, cough, sore throat, shortness of breath or body aches.  [address] [place], [address], must go inside of the hospital, NOT A DRIVE THRU!  (Must self quarantine after testing. Follow instructions on handout.)       Your procedure is scheduled on: 03/05/21   Report to [facility] Main Entrance    Report to admitting at: 7:05 AM   Call this number if you have problems the morning of surgery [phone]   Do not eat food :After Midnight.   May have liquids until : 6:50 AM   day of surgery  CLEAR LIQUID DIET  Foods Allowed                                                                      Foods Excluded  Water, Black Coffee and tea, regular and decaf                             liquids that you cannot  Plain Jell-O in any flavor  (No red)                                           see through such as: Fruit ices (not with fruit pulp)                                     milk, soups, orange juice  Iced Popsicles (No red)                                    All solid food                                   Apple juices Sports drinks like Gatorade (No red) Lightly seasoned clear broth or consume(fat free) Sugar  Sample Menu Breakfast                                Lunch                                     Supper Cranberry juice                    Beef broth                            Chicken broth Jell-O                                     Grape juice                           Apple juice Coffee or tea                        Jell-O                                      Popsicle                                                Coffee or tea                        Coffee or tea      Complete one Ensure drink the morning of surgery at: 6:50 AM       the day of surgery.    The day of surgery:  Drink ONE (1) Pre-Surgery Clear Ensure or G2 by am the morning of surgery. Drink in one sitting. Do not sip.  This drink was given to you during your hospital  pre-op appointment visit. Nothing else to drink after completing the  Pre-Surgery Clear Ensure or G2.          If you have questions, please contact your surgeon's office.     Oral Hygiene is also important to reduce your risk of infection.                                    Remember - BRUSH YOUR TEETH THE MORNING OF SURGERY WITH YOUR REGULAR TOOTHPASTE   Do NOT smoke after Midnight   Take these medicines  the morning of surgery with A SIP OF WATER: allopurinol.  DO NOT TAKE ANY ORAL DIABETIC MEDICATIONS DAY OF YOUR SURGERY                              You may not have any metal on your body  including hair pins, jewelry, and body piercing             Do not wear lotions, powders, perfumes/cologne, or deodorant              Men may shave face and neck.   Do not bring valuables to the hospital. [place].   Contacts, dentures or bridgework may not be worn into surgery.   Bring small overnight bag day of surgery.    Patients discharged on the day of surgery will not be allowed to drive home.   Special Instructions: Bring a copy of your healthcare power of attorney and living will documents         the day of surgery if you haven't scanned them before.              Please read over the following fact sheets you were given: IF YOU HAVE QUESTIONS ABOUT YOUR PRE-OP INSTRUCTIONS PLEASE CALL [phone]     [facility] - Preparing for Surgery Before surgery, you can play an important role.  Because skin is not sterile, your skin needs to be as free of germs as possible.  You can reduce the number of germs on your skin by washing with CHG (chlorahexidine gluconate) soap before surgery.  CHG is an antiseptic cleaner which kills germs and bonds with the skin to continue killing germs even after washing. Please DO NOT use if you have an allergy to CHG or antibacterial soaps.  If your skin becomes reddened/irritated stop using the CHG and inform your nurse when you arrive at Short Stay. Do not shave (including legs and underarms) for at least 48 hours prior to the first CHG shower.  You may shave your face/neck. Please follow these instructions carefully:  1.  Shower with CHG Soap the night before surgery and the  morning of Surgery.  2.  If you choose to wash your hair, wash your hair first as usual with your  normal  shampoo.  3.  After you shampoo, rinse your hair and body thoroughly to remove the  shampoo.                           4.  Use CHG as you would any other liquid soap.  You can apply chg directly  to the skin and wash                        Gently with a scrungie or clean washcloth.  5.  Apply the CHG Soap to your body ONLY FROM THE NECK DOWN.   Do not use on face/ open                           Wound or open sores. Avoid contact with eyes, ears mouth and genitals (private parts).  Wash face,  Genitals (private parts) with your normal soap.             6.  Wash thoroughly, paying special attention to the area where your surgery  will be performed.  7.  Thoroughly rinse your body with warm water from the neck down.  8.  DO NOT shower/wash with your normal soap after using and rinsing off  the CHG Soap.                9.  Pat yourself dry with a clean towel.            10.  Wear clean pajamas.            11.  Place clean sheets on your bed the night of your first shower and do not  sleep with pets. Day of Surgery : Do not apply any lotions/deodorants the morning of surgery.  Please wear clean clothes to the hospital/surgery center.  FAILURE TO FOLLOW THESE INSTRUCTIONS MAY RESULT IN THE CANCELLATION OF YOUR SURGERY PATIENT SIGNATURE_________________________________  NURSE SIGNATURE__________________________________  ________________________________________________________________________   Wayne Ortiz  An incentive spirometer is a tool that can help keep your lungs clear and active. This tool measures how well you are filling your lungs with each breath. Taking long deep breaths may help reverse or decrease the chance of developing breathing (pulmonary) problems (especially infection) following: A long period of time when you are unable to move or be active. BEFORE THE PROCEDURE  If the spirometer includes an indicator to show your best effort, your nurse or respiratory therapist will set it to a desired goal. If possible, sit up straight or lean slightly forward. Try not to slouch. Hold the incentive spirometer in an upright position. INSTRUCTIONS FOR USE  Sit on the edge of your bed  if possible, or sit up as far as you can in bed or on a chair. Hold the incentive spirometer in an upright position. Breathe out normally. Place the mouthpiece in your mouth and seal your lips tightly around it. Breathe in slowly and as deeply as possible, raising the piston or the ball toward the top of the column. Hold your breath for 3-5 seconds or for as long as possible. Allow the piston or ball to fall to the bottom of the column. Remove the mouthpiece from your mouth and breathe out normally. Rest for a few seconds and repeat Steps 1 through 7 at least 10 times every 1-2 hours when you are awake. Take your time and take a few normal breaths between deep breaths. The spirometer may include an indicator to show your best effort. Use the indicator as a goal to work toward during each repetition. After each set of 10 deep breaths, practice coughing to be sure your lungs are clear. If you have an incision (the cut made at the time of surgery), support your incision when coughing by placing a pillow or rolled up towels firmly against it. Once you are able to get out of bed, walk around indoors and cough well. You may stop using the incentive spirometer when instructed by your caregiver.  RISKS AND COMPLICATIONS Take your time so you do not get dizzy or light-headed. If you are in pain, you may need to take or ask for pain medication before doing incentive spirometry. It is harder to take a deep breath if you are having pain. AFTER USE Rest and breathe slowly and easily. It can be helpful to keep track of  a log of your progress. Your caregiver can provide you with a simple table to help with this. If you are using the spirometer at home, follow these instructions: [place] IF:  You are having difficultly using the spirometer. You have trouble using the spirometer as often as instructed. Your pain medication is not giving enough relief while using the spirometer. You develop fever of  100.5 F (38.1 C) or higher. SEEK IMMEDIATE MEDICAL CARE IF:  You cough up bloody sputum that had not been present before. You develop fever of 102 F (38.9 C) or greater. You develop worsening pain at or near the incision site. MAKE SURE YOU:  Understand these instructions. Will watch your condition. Will get help right away if you are not doing well or get worse. Document Released: 08/25/2006 Document Revised: 07/07/2011 Document Reviewed: 10/26/2006 [facility] Patient Information 2014 [place], [place].   ________________________________________________________________________

## 2021-02-27 ENCOUNTER — Encounter (HOSPITAL_COMMUNITY): Payer: Self-pay

## 2021-02-27 ENCOUNTER — Encounter (HOSPITAL_COMMUNITY)
Admission: RE | Admit: 2021-02-27 | Discharge: 2021-02-27 | Disposition: A | Discharge status: Home / Self Care | Payer: Medicare HMO | Source: Ambulatory Visit | Attending: Orthopedic Surgery | Admitting: Orthopedic Surgery

## 2021-02-27 ENCOUNTER — Other Ambulatory Visit: Payer: Self-pay

## 2021-02-27 VITALS — BP 126/85 | HR 100 | Temp 97.8°F | Ht 72.0 in | Wt 258.0 lb

## 2021-02-27 DIAGNOSIS — Z01812 Encounter for preprocedural laboratory examination: Secondary | ICD-10-CM | POA: Diagnosis present

## 2021-02-27 DIAGNOSIS — Z96651 Presence of right artificial knee joint: Secondary | ICD-10-CM | POA: Diagnosis not present

## 2021-02-27 DIAGNOSIS — M1712 Unilateral primary osteoarthritis, left knee: Secondary | ICD-10-CM | POA: Insufficient documentation

## 2021-02-27 DIAGNOSIS — I4891 Unspecified atrial fibrillation: Secondary | ICD-10-CM | POA: Diagnosis not present

## 2021-02-27 DIAGNOSIS — Z7901 Long term (current) use of anticoagulants: Secondary | ICD-10-CM | POA: Insufficient documentation

## 2021-02-27 DIAGNOSIS — Z955 Presence of coronary angioplasty implant and graft: Secondary | ICD-10-CM | POA: Diagnosis not present

## 2021-02-27 DIAGNOSIS — N400 Enlarged prostate without lower urinary tract symptoms: Secondary | ICD-10-CM | POA: Insufficient documentation

## 2021-02-27 DIAGNOSIS — Z87891 Personal history of nicotine dependence: Secondary | ICD-10-CM | POA: Diagnosis not present

## 2021-02-27 DIAGNOSIS — Z79899 Other long term (current) drug therapy: Secondary | ICD-10-CM | POA: Diagnosis not present

## 2021-02-27 DIAGNOSIS — I10 Essential (primary) hypertension: Secondary | ICD-10-CM | POA: Diagnosis not present

## 2021-02-27 DIAGNOSIS — I2510 Atherosclerotic heart disease of native coronary artery without angina pectoris: Secondary | ICD-10-CM | POA: Diagnosis not present

## 2021-02-27 DIAGNOSIS — K219 Gastro-esophageal reflux disease without esophagitis: Secondary | ICD-10-CM | POA: Insufficient documentation

## 2021-02-27 DIAGNOSIS — Z79890 Hormone replacement therapy (postmenopausal): Secondary | ICD-10-CM | POA: Diagnosis not present

## 2021-02-27 HISTORY — DX: Dyspnea, unspecified: R06.00

## 2021-02-27 HISTORY — DX: Malignant (primary) neoplasm, unspecified: C80.1

## 2021-02-27 NOTE — Progress Notes (Addendum)
COVID Vaccine Completed: Yes Date COVID Vaccine completed:   COVID vaccine manufacturer: Pfizer    [employer]   PCP - Dr. Allyn Kenner Cardiologist - Dr. Iona Coach LOV: 12/06/20. Clearance: [place] Nose: 02/12/21: Chart  Chest x-ray -  EKG - 12/06/20 [facility]. Requested/ Chart Stress Test -  ECHO - 08/22/16 [facility] Cardiac Cath - 02/04/21: [facility] Pacemaker/ICD device last checked:  Sleep Study - Yes CPAP - NO  Fasting Blood Sugar -  Checks Blood Sugar _____ times a day  Blood Thinner Instructions:Eliquis will be held 2 days prior surgery as per cardiologist instructions. Aspirin Instructions: Last Dose:  Anesthesia review: Hx: HTN,Afib,CAD  Patient denies shortness of breath, fever, cough and chest pain at PAT appointment   Patient verbalized understanding of instructions that were given to them at the PAT appointment. Patient was also instructed that they will need to review over the PAT instructions again at home before surgery.

## 2021-02-28 NOTE — Anesthesia Preprocedure Evaluation (Addendum)
Anesthesia Evaluation  Patient identified by MRN, date of birth, ID band Patient awake    Reviewed: Allergy & Precautions, NPO status , Patient's Chart, lab work & pertinent test results  Airway Mallampati: III  TM Distance: >3 FB Neck ROM: Full    Dental  (+) Edentulous Upper,    Pulmonary neg pulmonary ROS, Patient abstained from smoking., former smoker,    Pulmonary exam normal breath sounds clear to auscultation       Cardiovascular hypertension, + CAD and + Cardiac Stents  Normal cardiovascular exam+ dysrhythmias Atrial Fibrillation  Rhythm:Irregular Rate:Normal  Cardiac Cath 02/04/2021 Impressions:   2 vessel CAD with borderline mid LAD lesion (abnormal stress with mid anterior ischemia)  iFR of mid LAD was 0.93 (iFR pullback performed showing diffuse prox LAD disease)  Mid RCA disease is similar to 2016; R PDA lesion is worse than before, but 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  LVEDP 16-18 mmHg   Recommendations:   Risk factor modification and secondary prevention measures  Aggressive medical therapy for CAD and HF  Consider optimization of HF meds  Routine post-cath care; sheath out, TR band applied  To [facility] for short observation followed by discharge home   Neuro/Psych negative neurological ROS  negative psych ROS   GI/Hepatic Neg liver ROS, GERD  ,  Endo/Other  Hypothyroidism   Renal/GU negative Renal ROS  negative genitourinary   Musculoskeletal  (+) Arthritis ,   Abdominal   Peds  Hematology negative hematology ROS (+) On eliquis, last dose 11/4 5pm   Anesthesia Other Findings   Reproductive/Obstetrics                          Anesthesia Physical Anesthesia Plan  ASA: 3  Anesthesia Plan: Spinal and Regional   Post-op Pain Management:  Regional for Post-op pain   Induction:   PONV Risk Score and Plan: Treatment may vary due to age or medical  condition, Propofol infusion, Ondansetron and Dexamethasone  Airway Management Planned: Natural Airway  Additional Equipment:   Intra-op Plan:   Post-operative Plan:   Informed Consent: I have reviewed the patients History and Physical, chart, labs and discussed the procedure including the risks, benefits and alternatives for the proposed anesthesia with the patient or authorized representative who has indicated his/her understanding and acceptance.     Dental advisory given  Plan Discussed with: CRNA  Anesthesia Plan Comments:        Anesthesia Quick Evaluation

## 2021-02-28 NOTE — H&P (Signed)
TOTAL KNEE ADMISSION H&P  Patient is being admitted for left total knee arthroplasty.  Subjective:  Chief Complaint:left knee pain.  HPI: Wayne Ortiz, 75 y.o. male, has a history of pain and functional disability in the left knee due to arthritis and has failed non-surgical conservative treatments for greater than 12 weeks to includeNSAID's and/or analgesics, corticosteriod injections, and activity modification.  Onset of symptoms was gradual, starting 2 years ago with gradually worsening course since that time. The patient noted no past surgery on the left knee(s).  Patient currently rates pain in the left knee(s) at 8 out of 10 with activity. Patient has worsening of pain with activity and weight bearing, pain that interferes with activities of daily living, and pain with passive range of motion.  Patient has evidence of joint space narrowing by imaging studies. There is no active infection.  Patient Active Problem List   Diagnosis Date Noted   Acute respiratory disease due to COVID-19 virus 03/02/2019   Pneumonia due to COVID-19 virus 03/02/2019   Acute Hypoxic Respiratory failure, acute due to COVID PNA 03/02/2019   Liver abscess- s/p JP drain 03/02/2019   A-fib with RVR 03/15/2015   Essential hypertension 12/09/2014   Morbid obesity-BMI 37 12/09/2014   Hypothyroidism 12/09/2014   Dyspnea 12/09/2014   Chronic anticoagulation-Eliquis 12/09/2014   Persistent atrial fibrillation ([place]) 12/07/2014   Chest pain 09/13/2014   Palpitations 09/13/2014   CAD S/P RCA and CFX DES Feb 2016 09/13/2014   Coronary artery abnormality 06/22/2014   Presence of stent in right coronary artery 06/22/2014   Presence of coronary angioplasty implant and graft 06/22/2014   Abnormal cardiovascular function study 06/13/2014   Postoperative stiffness of total knee replacement ([place]) 06/21/2013   Pain in right knee 06/21/2013   Difficulty in walking(719.7) 06/21/2013   S/P right TKA 05/30/2013   Abdominal  pain, right upper quadrant 12/31/2012   Abnormal transaminases 12/31/2012   Thrombocytopenia, unspecified ([place]) 12/31/2012   Past Medical History:  Diagnosis Date   Arthritis    Atrial fibrillation (HCC)    BPH (benign prostatic hypertrophy)    CAD (coronary artery disease)    a. s/p DES 05/2014 at [place] [facility])    Complication of anesthesia    BECAME COMBATIVE   Dyspnea    Dysrhythmia    Femur fracture (HCC)    GERD (gastroesophageal reflux disease)    occasional TUMS   Gout    History of gout    History of kidney stones    Hypertension    Hypothyroidism    Persistent atrial fibrillation (HCC)    Precancerous skin lesion    Renal disorder     Past Surgical History:  Procedure Laterality Date   arm fracture surgery Left    CARDIOVERSION [name] 12/09/2014   Procedure: CARDIOVERSION;  Surgeon: [name], MD;  Location: [place];  Service: Cardiovascular;  Laterality: [name];   CARDIOVERSION [name] 05/05/2016   Procedure: CARDIOVERSION;  Surgeon: [name], MD;  Location: [place];  Service: Cardiovascular;  Laterality: [name];   CARDIOVERSION [name] 08/22/2016   Procedure: CARDIOVERSION;  Surgeon: Lelon Perla, MD;  Location: [facility] ENDOSCOPY;  Service: Cardiovascular;  Laterality: [name];   CATARACTS     EXCISION   CERVICAL SPINE SURGERY     CHOLECYSTECTOMY [name] 01/03/2013   Procedure: LAPAROSCOPIC CHOLECYSTECTOMY;  Surgeon: [name], MD;  Location: [name];  Service: General;  Laterality: [name];   ELECTROPHYSIOLOGIC STUDY [name] 03/15/2015   Procedure:  Atrial Fibrillation Ablation;  Surgeon: [name], MD;  Location: [place] CV LAB;  Service: Cardiovascular;  Laterality: [name];   ELECTROPHYSIOLOGIC STUDY [name] 04/01/2016   Procedure: Atrial Fibrillation Ablation;  Surgeon: [name], MD;  Location: [place] CV LAB;  Service: Cardiovascular;  Laterality: [name];   ERCP [name] 01/02/2013   Procedure: ENDOSCOPIC RETROGRADE CHOLANGIOPANCREATOGRAPHY (ERCP);  Surgeon: [name],  MD;  Location: [name];  Service: Endoscopy;  Laterality: [name];   FEMUR FRACTURE SURGERY     LIVER BIOPSY [name] 01/03/2013   Procedure: LIVER BIOPSY;  Surgeon: [name], MD;  Location: [name];  Service: General;  Laterality: [name];   RIGHT/LEFT HEART CATH AND CORONARY ANGIOGRAPHY [name] 07/04/2016   Procedure: Right/Left Heart Cath and Coronary Angiography;  Surgeon: Belva Crome, MD;  Location: [place] CV LAB;  Service: Cardiovascular;  Laterality: [name];   SPHINCTEROTOMY [name] 01/02/2013   Procedure: SPHINCTEROTOMY;  Surgeon: [name], MD;  Location: [name];  Service: Endoscopy;  Laterality: [name];  Stone Extraction   TEE WITHOUT CARDIOVERSION [name] 08/22/2016   Procedure: TRANSESOPHAGEAL ECHOCARDIOGRAM (TEE);  Surgeon: Lelon Perla, MD;  Location: [place];  Service: Cardiovascular;  Laterality: [name];   TOTAL KNEE ARTHROPLASTY Right 05/30/2013   Procedure: RIGHT TOTAL KNEE ARTHROPLASTY;  Surgeon: [name], MD;  Location: [name];  Service: Orthopedics;  Laterality: Right;    No current facility-administered medications for this encounter.   Current Outpatient Medications  Medication Sig Dispense Refill Last Dose   acetaminophen (TYLENOL) 500 MG tablet Take 1,000 mg by mouth every 6 (six) hours as needed for mild pain or fever.      allopurinol (ZYLOPRIM) 300 MG tablet Take 300 mg by mouth every morning.       docusate sodium (COLACE) 100 MG capsule Take 100-200 mg by mouth 2 (two) times daily as needed for constipation.      ELIQUIS 5 MG TABS tablet TAKE 1 TABLET BY MOUTH TWICE A DAY 60 tablet 6    furosemide (LASIX) 40 MG tablet TAKE ONE TABLET BY MOUTH DAILY,MAY TAKE EXTRA TAB DAILY AS NEEDED FOR WEIGHT GAIN/SWELLING (Patient taking differently: Take 40 mg by mouth daily.) 120 tablet 2    gabapentin (NEURONTIN) 300 MG capsule Take 900 mg by mouth at bedtime.      HYDROcodone-acetaminophen (NORCO) 10-325 MG tablet Take 1 tablet by mouth every 6 (six) hours as needed (pain).       SYNTHROID 112 MCG tablet Take 112 mcg by mouth daily at 2 am.      Allergies  Allergen Reactions   Amiodarone Shortness Of Breath   Atorvastatin Other (See Comments)    Arthralgias   Dilaudid [Hydromorphone] Other (See Comments)    Shaking all over (chills/Malaise)   Lisinopril Other (See Comments)    CP and SOB   Metoprolol Other (See Comments)    CP and SOb   Rosuvastatin Other (See Comments)    Myalgias   Diltiazem Rash   Rocephin [Ceftriaxone Sodium In Dextrose] Rash    Social History   Tobacco Use   Smoking status: Former    Packs/day: 1.00    Years: 20.00    Pack years: 20.00    Types: Cigarettes    Quit date: 05/23/1973    Years since quitting: 47.8   Smokeless tobacco: Never  Substance Use Topics   Alcohol use: Not Currently    Family History  Problem Relation Age of Onset   Kidney Stones Mother  Review of Systems  Constitutional:  Negative for chills and fever.  Respiratory:  Negative for cough and shortness of breath.   Cardiovascular:  Negative for chest pain.  Gastrointestinal:  Negative for nausea and vomiting.  Musculoskeletal:  Positive for arthralgias.    Objective:  Physical Exam Well nourished and well developed. General: Alert and oriented x3, cooperative and pleasant, no acute distress. Head: normocephalic, atraumatic, neck supple. Eyes: EOMI.  Musculoskeletal: Left knee exam: No palpable effusion, warmth erythema Tenderness medially and laterally Slight flexion contracture with flexion over 100 degrees with tightness and discomfort Neurovascularly intact in bilateral lower extremities without lower extremity edema erythema or calf tenderness  Calves soft and nontender. Motor function intact in LE. Strength 5/5 LE bilaterally. Neuro: Distal pulses 2+. Sensation to light touch intact in LE.  Vital signs in last 24 hours:    Labs:   Estimated body mass index is 34.99 kg/m as calculated from the following:   Height as of  02/27/21: 6' (1.829 m).   Weight as of 02/27/21: 117 kg.   Imaging Review Plain radiographs demonstrate severe degenerative joint disease of the left knee(s). The overall alignment isneutral. The bone quality appears to be adequate for age and reported activity level.      Assessment/Plan:  End stage arthritis, left knee   The patient history, physical examination, clinical judgment of the provider and imaging studies are consistent with end stage degenerative joint disease of the left knee(s) and total knee arthroplasty is deemed medically necessary. The treatment options including medical management, injection therapy arthroscopy and arthroplasty were discussed at length. The risks and benefits of total knee arthroplasty were presented and reviewed. The risks due to aseptic loosening, infection, stiffness, patella tracking problems, thromboembolic complications and other imponderables were discussed. The patient acknowledged the explanation, agreed to proceed with the plan and consent was signed. Patient is being admitted for inpatient treatment for surgery, pain control, PT, OT, prophylactic antibiotics, VTE prophylaxis, progressive ambulation and ADL's and discharge planning. The patient is planning to be discharged  home.  Therapy Plans: outpatient therapy at [facility] in [place] Disposition: Home with son and daughter in law Planned DVT Prophylaxis: Eliquis 5 mg BID DME needed: none PCP: Dr. Delphina Cahill, clearance received Cardiologist: Dr. Iona Coach, states he was given verbal clearance after cardiac cath. Reaching out to office. TXA: IV Allergies: Dilaudid - shaking/convulsing Anesthesia Concerns: none BMI: 38.2 Last HgbA1c: Not diabetic  Other: - Cardiac cath on 02/04/21 - Hx of DDD & spinal stenosis - Dr. Larose Hires - Norco 10 (typically 2 daily) for his back Leonie [place]) - Oxycodone, robaxin vs flexeril, tylenol (No NSAIDs) - Lung issues, worse since COVID 2 years  ago   Patient's anticipated LOS is less than 2 midnights, meeting these requirements: - Younger than 41 - Lives within 1 hour of care - Has a competent adult at home to recover with post-op recover - NO history of  - Chronic pain requiring opiods  - Diabetes  - Coronary Artery Disease  - Heart failure  - Heart attack  - Stroke  - DVT/VTE  - Cardiac arrhythmia  - Respiratory Failure/COPD  - Renal failure  - Anemia  - Advanced Liver disease  Griffith Citron, PA-C Orthopedic Surgery [facility] [place] [phone]

## 2021-02-28 NOTE — Progress Notes (Signed)
Anesthesia Chart Review   Case: [id number] Date/Time: 03/05/21 0935   Procedure: TOTAL KNEE ARTHROPLASTY (Left: Knee)   Anesthesia type: Spinal   Pre-op diagnosis: Left knee osteoarthritis   [facility]: [facility] ROOM 09 / [name]   Surgeons: Paralee Cancel, MD       DISCUSSION:75 y.o. former smoker with h/o HTN, GERD, atrial fibrillation (on Eliquis), CAD (05/2014 RCA & LCX PCI), BPH, left knee OA scheduled for above procedure 03/05/2021 with Dr. Paralee Cancel.   Clearance from PCP on chart which states pt is moderate risk for planned procedure due to "cardiac issues, already cleared by cardiology."  Seen by cardiology for preoperative evaluation.  Pt had an abnormal stress test with subsequent normal cath, cleared for procedure.   Pt was advised to hold Eliquis for 2 days prior to procedure. Discussed with Costella Hatcher, PA.  If he wishes to proceed with a spinal will need to hold for 3 days.   Anticipate pt can proceed with planned procedure barring acute status change.   VS: BP 126/85   Pulse 100   Temp 36.6 C (Oral)   Ht 6' (1.829 m)   Wt 117 kg   SpO2 95%   BMI 34.99 kg/m   PROVIDERS: Celene Squibb, MD is PCP   Jerrell Belfast, MD is Cardiologist  LABS:  labs on chart (all labs ordered are listed, but only abnormal results are displayed)  Labs Reviewed  TYPE AND SCREEN     IMAGES:   EKG: On chart  CV: Cardiac Cath 02/04/2021 Impressions:   2 vessel CAD with borderline mid LAD lesion (abnormal stress with mid anterior ischemia)  iFR of mid LAD was 0.93 (iFR pullback performed showing diffuse prox LAD disease)  Mid RCA disease is similar to 2016; R PDA lesion is worse than before, but 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  LVEDP 16-18 mmHg   Recommendations:   Risk factor modification and secondary prevention measures  Aggressive medical therapy for CAD and HF  Consider optimization of HF meds  Routine post-cath care; sheath out, TR band  applied  To [facility] for short observation followed by discharge home  Past Medical History:  Diagnosis Date   Arthritis    Atrial fibrillation (HCC)    BPH (benign prostatic hypertrophy)    CAD (coronary artery disease)    a. s/p DES 05/2014 at [place] [facility])    Complication of anesthesia    BECAME COMBATIVE   Dyspnea    Dysrhythmia    Femur fracture (HCC)    GERD (gastroesophageal reflux disease)    occasional TUMS   Gout    History of gout    History of kidney stones    Hypertension    Hypothyroidism    Persistent atrial fibrillation ([place])    Precancerous skin lesion    Renal disorder     Past Surgical History:  Procedure Laterality Date   arm fracture surgery Left    CARDIOVERSION [name] 12/09/2014   Procedure: CARDIOVERSION;  Surgeon: [name], MD;  Location: [place];  Service: Cardiovascular;  Laterality: [name];   CARDIOVERSION [name] 05/05/2016   Procedure: CARDIOVERSION;  Surgeon: [name], MD;  Location: [place];  Service: Cardiovascular;  Laterality: [name];   CARDIOVERSION [name] 08/22/2016   Procedure: CARDIOVERSION;  Surgeon: Lelon Perla, MD;  Location: [facility] ENDOSCOPY;  Service: Cardiovascular;  Laterality: [name];   CATARACTS     EXCISION   CERVICAL SPINE SURGERY  CHOLECYSTECTOMY [name] 01/03/2013   Procedure: LAPAROSCOPIC CHOLECYSTECTOMY;  Surgeon: [name], MD;  Location: [name];  Service: General;  Laterality: [name];   ELECTROPHYSIOLOGIC STUDY [name] 03/15/2015   Procedure: Atrial Fibrillation Ablation;  Surgeon: [name], MD;  Location: [place] CV LAB;  Service: Cardiovascular;  Laterality: [name];   ELECTROPHYSIOLOGIC STUDY [name] 04/01/2016   Procedure: Atrial Fibrillation Ablation;  Surgeon: [name], MD;  Location: [place] CV LAB;  Service: Cardiovascular;  Laterality: [name];   ERCP [name] 01/02/2013   Procedure: ENDOSCOPIC RETROGRADE CHOLANGIOPANCREATOGRAPHY (ERCP);  Surgeon: [name], MD;  Location: [name];  Service: Endoscopy;  Laterality:  [name];   FEMUR FRACTURE SURGERY     LIVER BIOPSY [name] 01/03/2013   Procedure: LIVER BIOPSY;  Surgeon: [name], MD;  Location: [name];  Service: General;  Laterality: [name];   RIGHT/LEFT HEART CATH AND CORONARY ANGIOGRAPHY [name] 07/04/2016   Procedure: Right/Left Heart Cath and Coronary Angiography;  Surgeon: Belva Crome, MD;  Location: [place] CV LAB;  Service: Cardiovascular;  Laterality: [name];   SPHINCTEROTOMY [name] 01/02/2013   Procedure: SPHINCTEROTOMY;  Surgeon: [name], MD;  Location: [name];  Service: Endoscopy;  Laterality: [name];  Stone Extraction   TEE WITHOUT CARDIOVERSION [name] 08/22/2016   Procedure: TRANSESOPHAGEAL ECHOCARDIOGRAM (TEE);  Surgeon: Lelon Perla, MD;  Location: [place];  Service: Cardiovascular;  Laterality: [name];   TOTAL KNEE ARTHROPLASTY Right 05/30/2013   Procedure: RIGHT TOTAL KNEE ARTHROPLASTY;  Surgeon: [name], MD;  Location[name];  Service: Orthopedics;  Laterality: Right;    MEDICATIONS:  acetaminophen (TYLENOL) 500 MG tablet   allopurinol (ZYLOPRIM) 300 MG tablet   docusate sodium (COLACE) 100 MG capsule   ELIQUIS 5 MG TABS tablet   furosemide (LASIX) 40 MG tablet   gabapentin (NEURONTIN) 300 MG capsule   HYDROcodone-acetaminophen (NORCO) 10-325 MG tablet   SYNTHROID 112 MCG tablet   No current facility-administered medications for this encounter.    Konrad Felix Ward, PA-C [facility] [phone]

## 2021-03-01 ENCOUNTER — Other Ambulatory Visit: Payer: Self-pay | Admitting: Orthopedic Surgery

## 2021-03-01 LAB — SARS CORONAVIRUS 2 (TAT 6-24 HRS): SARS Coronavirus 2: NEGATIVE

## 2021-03-05 ENCOUNTER — Other Ambulatory Visit: Payer: Self-pay

## 2021-03-05 ENCOUNTER — Ambulatory Visit (HOSPITAL_COMMUNITY): Payer: Medicare HMO | Admitting: Certified Registered"

## 2021-03-05 ENCOUNTER — Encounter (HOSPITAL_COMMUNITY): Payer: Self-pay | Admitting: Orthopedic Surgery

## 2021-03-05 ENCOUNTER — Encounter (HOSPITAL_COMMUNITY)
Admission: RE | Disposition: A | Discharge status: Home / Self Care | Payer: Self-pay | Source: Ambulatory Visit | Attending: Orthopedic Surgery

## 2021-03-05 ENCOUNTER — Ambulatory Visit (HOSPITAL_COMMUNITY): Payer: Medicare HMO | Admitting: Physician Assistant

## 2021-03-05 ENCOUNTER — Observation Stay (HOSPITAL_COMMUNITY)
Admission: RE | Admit: 2021-03-05 | Discharge: 2021-03-06 | Disposition: A | Discharge status: Home / Self Care | Payer: Medicare HMO | Source: Ambulatory Visit | Attending: Orthopedic Surgery | Admitting: Orthopedic Surgery

## 2021-03-05 DIAGNOSIS — I2510 Atherosclerotic heart disease of native coronary artery without angina pectoris: Secondary | ICD-10-CM | POA: Insufficient documentation

## 2021-03-05 DIAGNOSIS — M1712 Unilateral primary osteoarthritis, left knee: Secondary | ICD-10-CM | POA: Diagnosis not present

## 2021-03-05 DIAGNOSIS — D696 Thrombocytopenia, unspecified: Secondary | ICD-10-CM | POA: Diagnosis not present

## 2021-03-05 DIAGNOSIS — Z8616 Personal history of COVID-19: Secondary | ICD-10-CM | POA: Insufficient documentation

## 2021-03-05 DIAGNOSIS — I4819 Other persistent atrial fibrillation: Secondary | ICD-10-CM | POA: Diagnosis not present

## 2021-03-05 DIAGNOSIS — Z96652 Presence of left artificial knee joint: Secondary | ICD-10-CM

## 2021-03-05 DIAGNOSIS — Z87891 Personal history of nicotine dependence: Secondary | ICD-10-CM | POA: Insufficient documentation

## 2021-03-05 DIAGNOSIS — I10 Essential (primary) hypertension: Secondary | ICD-10-CM | POA: Diagnosis not present

## 2021-03-05 DIAGNOSIS — Z96651 Presence of right artificial knee joint: Secondary | ICD-10-CM | POA: Insufficient documentation

## 2021-03-05 DIAGNOSIS — Z7901 Long term (current) use of anticoagulants: Secondary | ICD-10-CM | POA: Diagnosis not present

## 2021-03-05 DIAGNOSIS — Z955 Presence of coronary angioplasty implant and graft: Secondary | ICD-10-CM | POA: Diagnosis not present

## 2021-03-05 DIAGNOSIS — E039 Hypothyroidism, unspecified: Secondary | ICD-10-CM | POA: Insufficient documentation

## 2021-03-05 DIAGNOSIS — G8918 Other acute postprocedural pain: Secondary | ICD-10-CM | POA: Diagnosis not present

## 2021-03-05 HISTORY — PX: TOTAL KNEE ARTHROPLASTY: SHX125

## 2021-03-05 LAB — CBC
HCT: 50.4 % (ref 39.0–52.0)
Hemoglobin: 16.8 g/dL (ref 13.0–17.0)
MCH: 34.3 pg — ABNORMAL HIGH (ref 26.0–34.0)
MCHC: 33.3 g/dL (ref 30.0–36.0)
MCV: 102.9 fL — ABNORMAL HIGH (ref 80.0–100.0)
Platelets: 129 10*3/uL — ABNORMAL LOW (ref 150–400)
RBC: 4.9 MIL/uL (ref 4.22–5.81)
RDW: 13.6 % (ref 11.5–15.5)
WBC: 5.2 10*3/uL (ref 4.0–10.5)
nRBC: 0 % (ref 0.0–0.2)

## 2021-03-05 LAB — TYPE AND SCREEN
ABO/RH(D): A POS
Antibody Screen: NEGATIVE

## 2021-03-05 SURGERY — ARTHROPLASTY, KNEE, TOTAL
Anesthesia: Regional | Site: Knee | Laterality: Left

## 2021-03-05 MED ORDER — FENTANYL CITRATE PF 50 MCG/ML IJ SOSY
50.0000 ug | PREFILLED_SYRINGE | INTRAMUSCULAR | Status: DC
  Administered 2021-03-05: 50 ug via INTRAVENOUS
  Filled 2021-03-05: qty 2

## 2021-03-05 MED ORDER — LIDOCAINE 2% (20 MG/ML) 5 ML SYRINGE
INTRAMUSCULAR | Status: DC | PRN
  Administered 2021-03-05: 60 mg via INTRAVENOUS

## 2021-03-05 MED ORDER — POVIDONE-IODINE 10 % EX SWAB
2.0000 "application " | Freq: Once | CUTANEOUS | Status: AC
  Administered 2021-03-05: 2 via TOPICAL

## 2021-03-05 MED ORDER — OXYCODONE HCL 5 MG PO TABS
10.0000 mg | ORAL_TABLET | ORAL | Status: DC | PRN
  Administered 2021-03-05 – 2021-03-06 (×2): 10 mg via ORAL

## 2021-03-05 MED ORDER — ONDANSETRON HCL 4 MG/2ML IJ SOLN: 4.0000 mg | Freq: Four times a day (QID) | INTRAMUSCULAR | Status: DC | PRN

## 2021-03-05 MED ORDER — DEXAMETHASONE SODIUM PHOSPHATE 10 MG/ML IJ SOLN
10.0000 mg | Freq: Once | INTRAMUSCULAR | Status: AC
  Administered 2021-03-06: 10 mg via INTRAVENOUS
  Filled 2021-03-05: qty 1

## 2021-03-05 MED ORDER — GABAPENTIN 300 MG PO CAPS
900.0000 mg | ORAL_CAPSULE | Freq: Every day | ORAL | Status: DC
  Administered 2021-03-05: 900 mg via ORAL
  Filled 2021-03-05: qty 3

## 2021-03-05 MED ORDER — OXYCODONE HCL 5 MG PO TABS
5.0000 mg | ORAL_TABLET | ORAL | Status: DC | PRN
  Administered 2021-03-05: 5 mg via ORAL
  Administered 2021-03-05: 10 mg via ORAL
  Administered 2021-03-06: 5 mg via ORAL
  Filled 2021-03-05: qty 1
  Filled 2021-03-05 (×4): qty 2

## 2021-03-05 MED ORDER — ONDANSETRON HCL 4 MG PO TABS: 4.0000 mg | ORAL_TABLET | Freq: Four times a day (QID) | ORAL | Status: DC | PRN

## 2021-03-05 MED ORDER — POLYETHYLENE GLYCOL 3350 17 G PO PACK: 17.0000 g | PACK | Freq: Every day | ORAL | Status: DC | PRN

## 2021-03-05 MED ORDER — ORAL CARE MOUTH RINSE: 15.0000 mL | Freq: Once | OROMUCOSAL | Status: AC

## 2021-03-05 MED ORDER — PHENYLEPHRINE HCL-NACL 20-0.9 MG/250ML-% IV SOLN
INTRAVENOUS | Status: DC | PRN
  Administered 2021-03-05: 20 ug/min via INTRAVENOUS

## 2021-03-05 MED ORDER — LEVOTHYROXINE SODIUM 112 MCG PO TABS
112.0000 ug | ORAL_TABLET | Freq: Every day | ORAL | Status: DC
  Administered 2021-03-06: 112 ug via ORAL
  Filled 2021-03-05: qty 1

## 2021-03-05 MED ORDER — APIXABAN 5 MG PO TABS
5.0000 mg | ORAL_TABLET | Freq: Two times a day (BID) | ORAL | Status: DC
  Administered 2021-03-06: 5 mg via ORAL
  Filled 2021-03-05: qty 1

## 2021-03-05 MED ORDER — ONDANSETRON HCL 4 MG/2ML IJ SOLN
INTRAMUSCULAR | Status: AC
  Filled 2021-03-05: qty 2

## 2021-03-05 MED ORDER — SODIUM CHLORIDE 0.9 % IR SOLN
Status: DC | PRN
  Administered 2021-03-05: 1000 mL

## 2021-03-05 MED ORDER — BISACODYL 10 MG RE SUPP: 10.0000 mg | Freq: Every day | RECTAL | Status: DC | PRN

## 2021-03-05 MED ORDER — SODIUM CHLORIDE (PF) 0.9 % IJ SOLN
INTRAMUSCULAR | Status: AC
  Filled 2021-03-05: qty 30

## 2021-03-05 MED ORDER — CYCLOBENZAPRINE HCL 5 MG PO TABS
5.0000 mg | ORAL_TABLET | Freq: Three times a day (TID) | ORAL | Status: DC | PRN
  Administered 2021-03-05: 5 mg via ORAL
  Filled 2021-03-05: qty 1

## 2021-03-05 MED ORDER — FENTANYL CITRATE (PF) 100 MCG/2ML IJ SOLN
INTRAMUSCULAR | Status: AC
  Filled 2021-03-05: qty 2

## 2021-03-05 MED ORDER — FERROUS SULFATE 325 (65 FE) MG PO TABS
325.0000 mg | ORAL_TABLET | Freq: Three times a day (TID) | ORAL | Status: DC
  Administered 2021-03-06: 325 mg via ORAL
  Filled 2021-03-05 (×2): qty 1

## 2021-03-05 MED ORDER — CEFAZOLIN SODIUM-DEXTROSE 2-4 GM/100ML-% IV SOLN
2.0000 g | INTRAVENOUS | Status: AC
  Administered 2021-03-05: 2 g via INTRAVENOUS
  Filled 2021-03-05: qty 100

## 2021-03-05 MED ORDER — MIDAZOLAM HCL 2 MG/2ML IJ SOLN
INTRAMUSCULAR | Status: AC
  Filled 2021-03-05: qty 2

## 2021-03-05 MED ORDER — FUROSEMIDE 40 MG PO TABS: 40.0000 mg | ORAL_TABLET | Freq: Every day | ORAL | Status: DC | PRN

## 2021-03-05 MED ORDER — MORPHINE SULFATE (PF) 2 MG/ML IV SOLN: 1.0000 mg | INTRAVENOUS | Status: DC | PRN

## 2021-03-05 MED ORDER — CHLORHEXIDINE GLUCONATE 0.12 % MT SOLN
15.0000 mL | Freq: Once | OROMUCOSAL | Status: AC
  Administered 2021-03-05: 15 mL via OROMUCOSAL

## 2021-03-05 MED ORDER — ALLOPURINOL 300 MG PO TABS
300.0000 mg | ORAL_TABLET | Freq: Every morning | ORAL | Status: DC
  Administered 2021-03-06: 300 mg via ORAL
  Filled 2021-03-05: qty 1

## 2021-03-05 MED ORDER — BUPIVACAINE IN DEXTROSE 0.75-8.25 % IT SOLN
INTRATHECAL | Status: DC | PRN
  Administered 2021-03-05: 1.8 mL via INTRATHECAL

## 2021-03-05 MED ORDER — DEXAMETHASONE SODIUM PHOSPHATE 10 MG/ML IJ SOLN
INTRAMUSCULAR | Status: DC | PRN
  Administered 2021-03-05: 4 mg via INTRAVENOUS

## 2021-03-05 MED ORDER — ACETAMINOPHEN 500 MG PO TABS
1000.0000 mg | ORAL_TABLET | Freq: Once | ORAL | Status: AC
  Administered 2021-03-05: 1000 mg via ORAL
  Filled 2021-03-05: qty 2

## 2021-03-05 MED ORDER — SODIUM CHLORIDE (PF) 0.9 % IJ SOLN
INTRAMUSCULAR | Status: DC | PRN
  Administered 2021-03-05: 30 mL

## 2021-03-05 MED ORDER — PROPOFOL 10 MG/ML IV BOLUS
INTRAVENOUS | Status: AC
  Filled 2021-03-05: qty 20

## 2021-03-05 MED ORDER — MIDAZOLAM HCL 2 MG/2ML IJ SOLN
INTRAMUSCULAR | Status: DC | PRN
  Administered 2021-03-05 (×2): 1 mg via INTRAVENOUS

## 2021-03-05 MED ORDER — DOCUSATE SODIUM 100 MG PO CAPS
100.0000 mg | ORAL_CAPSULE | Freq: Two times a day (BID) | ORAL | Status: DC
  Administered 2021-03-05 – 2021-03-06 (×2): 100 mg via ORAL
  Filled 2021-03-05 (×2): qty 1

## 2021-03-05 MED ORDER — CEFAZOLIN SODIUM-DEXTROSE 2-4 GM/100ML-% IV SOLN
2.0000 g | Freq: Four times a day (QID) | INTRAVENOUS | Status: AC
  Administered 2021-03-05 (×2): 2 g via INTRAVENOUS
  Filled 2021-03-05 (×2): qty 100

## 2021-03-05 MED ORDER — BUPIVACAINE-EPINEPHRINE (PF) 0.25% -1:200000 IJ SOLN
INTRAMUSCULAR | Status: DC | PRN
  Administered 2021-03-05: 30 mL

## 2021-03-05 MED ORDER — MENTHOL 3 MG MT LOZG: 1.0000 | LOZENGE | OROMUCOSAL | Status: DC | PRN

## 2021-03-05 MED ORDER — TRANEXAMIC ACID-NACL 1000-0.7 MG/100ML-% IV SOLN
1000.0000 mg | Freq: Once | INTRAVENOUS | Status: AC
  Administered 2021-03-05: 1000 mg via INTRAVENOUS
  Filled 2021-03-05: qty 100

## 2021-03-05 MED ORDER — BUPIVACAINE-EPINEPHRINE (PF) 0.25% -1:200000 IJ SOLN
INTRAMUSCULAR | Status: AC
  Filled 2021-03-05: qty 30

## 2021-03-05 MED ORDER — DEXAMETHASONE SODIUM PHOSPHATE 10 MG/ML IJ SOLN: 8.0000 mg | Freq: Once | INTRAMUSCULAR | Status: DC

## 2021-03-05 MED ORDER — KETOROLAC TROMETHAMINE 30 MG/ML IJ SOLN
INTRAMUSCULAR | Status: DC | PRN
  Administered 2021-03-05: 30 mg

## 2021-03-05 MED ORDER — PROPOFOL 10 MG/ML IV BOLUS
INTRAVENOUS | Status: DC | PRN
  Administered 2021-03-05: 30 mg via INTRAVENOUS

## 2021-03-05 MED ORDER — LACTATED RINGERS IV SOLN: INTRAVENOUS | Status: DC

## 2021-03-05 MED ORDER — FENTANYL CITRATE PF 50 MCG/ML IJ SOSY: 25.0000 ug | PREFILLED_SYRINGE | INTRAMUSCULAR | Status: DC | PRN

## 2021-03-05 MED ORDER — METOCLOPRAMIDE HCL 5 MG/ML IJ SOLN: 5.0000 mg | Freq: Three times a day (TID) | INTRAMUSCULAR | Status: DC | PRN

## 2021-03-05 MED ORDER — DIPHENHYDRAMINE HCL 12.5 MG/5ML PO ELIX: 12.5000 mg | ORAL_SOLUTION | ORAL | Status: DC | PRN

## 2021-03-05 MED ORDER — 0.9 % SODIUM CHLORIDE (POUR BTL) OPTIME
TOPICAL | Status: DC | PRN
  Administered 2021-03-05: 1000 mL

## 2021-03-05 MED ORDER — STERILE WATER FOR IRRIGATION IR SOLN
Status: DC | PRN
  Administered 2021-03-05: 2000 mL

## 2021-03-05 MED ORDER — SODIUM CHLORIDE 0.9 % IV SOLN: INTRAVENOUS | Status: DC

## 2021-03-05 MED ORDER — TRANEXAMIC ACID-NACL 1000-0.7 MG/100ML-% IV SOLN
1000.0000 mg | INTRAVENOUS | Status: AC
  Administered 2021-03-05: 1000 mg via INTRAVENOUS
  Filled 2021-03-05: qty 100

## 2021-03-05 MED ORDER — PROPOFOL 500 MG/50ML IV EMUL
INTRAVENOUS | Status: DC | PRN
  Administered 2021-03-05: 40 ug/kg/min via INTRAVENOUS

## 2021-03-05 MED ORDER — KETOROLAC TROMETHAMINE 30 MG/ML IJ SOLN
INTRAMUSCULAR | Status: AC
  Filled 2021-03-05: qty 1

## 2021-03-05 MED ORDER — METOCLOPRAMIDE HCL 5 MG PO TABS: 5.0000 mg | ORAL_TABLET | Freq: Three times a day (TID) | ORAL | Status: DC | PRN

## 2021-03-05 MED ORDER — ACETAMINOPHEN 325 MG PO TABS: 325.0000 mg | ORAL_TABLET | Freq: Four times a day (QID) | ORAL | Status: DC | PRN

## 2021-03-05 MED ORDER — ONDANSETRON HCL 4 MG/2ML IJ SOLN
INTRAMUSCULAR | Status: DC | PRN
  Administered 2021-03-05: 4 mg via INTRAVENOUS

## 2021-03-05 MED ORDER — PHENOL 1.4 % MT LIQD: 1.0000 | OROMUCOSAL | Status: DC | PRN

## 2021-03-05 MED ORDER — FENTANYL CITRATE (PF) 100 MCG/2ML IJ SOLN
INTRAMUSCULAR | Status: DC | PRN
  Administered 2021-03-05: 50 ug via INTRAVENOUS

## 2021-03-05 MED ORDER — DEXAMETHASONE SODIUM PHOSPHATE 10 MG/ML IJ SOLN
INTRAMUSCULAR | Status: AC
  Filled 2021-03-05: qty 1

## 2021-03-05 SURGICAL SUPPLY — 55 items
ADH SKN CLS APL DERMABOND .7 (GAUZE/BANDAGES/DRESSINGS) ×1
ATTUNE MED ANAT PAT 41 KNEE (Knees) ×1 IMPLANT
ATTUNE PSFEM LTSZ6 NARCEM KNEE (Femur) ×1 IMPLANT
ATTUNE PSRP INSR SZ6 6 KNEE (Insert) ×1 IMPLANT
BAG COUNTER SPONGE SURGICOUNT (BAG) IMPLANT
BAG SPEC THK2 15X12 ZIP CLS (MISCELLANEOUS)
BAG SPNG CNTER NS LX DISP (BAG)
BAG ZIPLOCK 12X15 (MISCELLANEOUS) IMPLANT
BASE TIBIAL ROT PLAT SZ 7 KNEE (Knees) IMPLANT
BLADE SAW SGTL 11.0X1.19X90.0M (BLADE) ×1 IMPLANT
BLADE SAW SGTL 13.0X1.19X90.0M (BLADE) ×2 IMPLANT
BLADE SURG SZ10 CARB STEEL (BLADE) ×4 IMPLANT
BNDG ELASTIC 6X5.8 VLCR STR LF (GAUZE/BANDAGES/DRESSINGS) ×2 IMPLANT
BOWL SMART MIX CTS (DISPOSABLE) ×2 IMPLANT
BSPLAT TIB 7 CMNT ROT PLAT STR (Knees) ×1 IMPLANT
CEMENT HV SMART SET (Cement) ×2 IMPLANT
CUFF TOURN SGL QUICK 34 (TOURNIQUET CUFF) ×2
CUFF TRNQT CYL 34X4.125X (TOURNIQUET CUFF) ×1 IMPLANT
DECANTER SPIKE VIAL GLASS SM (MISCELLANEOUS) ×4 IMPLANT
DERMABOND ADVANCED (GAUZE/BANDAGES/DRESSINGS) ×1
DERMABOND ADVANCED .7 DNX12 (GAUZE/BANDAGES/DRESSINGS) ×1 IMPLANT
DRAPE INCISE IOBAN 66X45 STRL (DRAPES) ×2 IMPLANT
DRAPE U-SHAPE 47X51 STRL (DRAPES) ×2 IMPLANT
DRESSING AQUACEL AG SP 3.5X10 (GAUZE/BANDAGES/DRESSINGS) ×1 IMPLANT
DRSG AQUACEL AG ADV 3.5X10 (GAUZE/BANDAGES/DRESSINGS) ×1 IMPLANT
DRSG AQUACEL AG SP 3.5X10 (GAUZE/BANDAGES/DRESSINGS) ×2
DURAPREP 26ML APPLICATOR (WOUND CARE) ×4 IMPLANT
ELECT REM PT RETURN 15FT ADLT (MISCELLANEOUS) ×2 IMPLANT
GLOVE SURG ENC MOIS LTX SZ6 (GLOVE) ×2 IMPLANT
GLOVE SURG ENC MOIS LTX SZ7 (GLOVE) ×2 IMPLANT
GLOVE SURG POLY ORTHO LF SZ6.5 (GLOVE) ×2 IMPLANT
GLOVE SURG UNDER POLY LF SZ7.5 (GLOVE) ×2 IMPLANT
GOWN STRL REUS W/TWL LRG LVL3 (GOWN DISPOSABLE) ×2 IMPLANT
HANDPIECE INTERPULSE COAX TIP (DISPOSABLE) ×2
HOLDER FOLEY CATH W/STRAP (MISCELLANEOUS) IMPLANT
KIT TURNOVER KIT A (KITS) IMPLANT
MANIFOLD NEPTUNE II (INSTRUMENTS) ×2 IMPLANT
NDL SAFETY ECLIPSE 18X1.5 (NEEDLE) IMPLANT
NEEDLE HYPO 18GX1.5 SHARP (NEEDLE)
NS IRRIG 1000ML POUR BTL (IV SOLUTION) ×2 IMPLANT
PACK TOTAL KNEE CUSTOM (KITS) ×2 IMPLANT
PROTECTOR NERVE ULNAR (MISCELLANEOUS) ×2 IMPLANT
SET HNDPC FAN SPRY TIP SCT (DISPOSABLE) ×1 IMPLANT
SET PAD KNEE POSITIONER (MISCELLANEOUS) ×2 IMPLANT
SUT MNCRL AB 4-0 PS2 18 (SUTURE) ×2 IMPLANT
SUT STRATAFIX PDS+ 0 24IN (SUTURE) ×2 IMPLANT
SUT VIC AB 1 CT1 36 (SUTURE) ×2 IMPLANT
SUT VIC AB 2-0 CT1 27 (SUTURE) ×6
SUT VIC AB 2-0 CT1 TAPERPNT 27 (SUTURE) ×3 IMPLANT
SYR 3ML LL SCALE MARK (SYRINGE) ×2 IMPLANT
TIBIAL BASE ROT PLAT SZ 7 KNEE (Knees) ×2 IMPLANT
TRAY FOLEY MTR SLVR 16FR STAT (SET/KITS/TRAYS/PACK) ×2 IMPLANT
TUBE SUCTION HIGH CAP CLEAR NV (SUCTIONS) ×2 IMPLANT
WATER STERILE IRR 1000ML POUR (IV SOLUTION) ×4 IMPLANT
WRAP KNEE MAXI GEL POST OP (GAUZE/BANDAGES/DRESSINGS) ×2 IMPLANT

## 2021-03-05 NOTE — Progress Notes (Signed)
AssistedDr. Hulan Fray with left, ultrasound guided, adductor canal block. Side rails up, monitors on throughout procedure. See vital signs in flow sheet. Tolerated Procedure well.

## 2021-03-05 NOTE — Op Note (Signed)
NAME:  Wayne Ortiz                      MEDICAL RECORD NO.:  [id number]                             FACILITY:  [facility]      PHYSICIAN:  Pietro Cassis. Alvan Dame, M.D.  DATE OF BIRTH:  [birth year]      DATE OF PROCEDURE:  03/05/2021                                     OPERATIVE REPORT         PREOPERATIVE DIAGNOSIS:  Left knee osteoarthritis.      POSTOPERATIVE DIAGNOSIS:  Left knee osteoarthritis.      FINDINGS:  The patient was noted to have complete loss of cartilage and   bone-on-bone arthritis with associated osteophytes in the medial and patellofemoral compartments of   the knee with a significant synovitis and associated effusion.  The patient had failed months of conservative treatment including medications, injection therapy, activity modification.     PROCEDURE:  Left total knee replacement.      COMPONENTS USED:  DePuy Attune rotating platform posterior stabilized knee   system, a size 6 N femur, 7 tibia, size 6 mm PS AOX insert, and 41 anatomic patellar   button.      SURGEON:  Pietro Cassis. Alvan Dame, M.D.      ASSISTANT:  Costella Hatcher, PA-C.      ANESTHESIA:  Regional and Spinal.      SPECIMENS:  None.      COMPLICATION:  None.      DRAINS:  None.  EBL: <150 cc      TOURNIQUET TIME:  45 min at 250 mmHg     The patient was stable to the recovery room.      INDICATION FOR PROCEDURE:  Wayne Ortiz is a 75 y.o. male patient of   mine.  The patient had been seen, evaluated, and treated for months conservatively in the   office with medication, activity modification, and injections.  The patient had   radiographic changes of bone-on-bone arthritis with endplate sclerosis and osteophytes noted.  Based on the radiographic changes and failed conservative measures, the patient   decided to proceed with definitive treatment, total knee replacement.  Risks of infection, DVT, component failure, need for revision surgery, neurovascular injury were reviewed in the office setting.  The  postop course was reviewed stressing the efforts to maximize post-operative satisfaction and function.  Consent was obtained for benefit of pain   relief.      PROCEDURE IN DETAIL:  The patient was brought to the operative theater.   Once adequate anesthesia, preoperative antibiotics, 2 gm of Ancef,1 gm of Tranexamic Acid, and 10 mg of Decadron administered, the patient was positioned supine with a left thigh tourniquet placed.  The  left lower extremity was prepped and draped in sterile fashion.  A time-   out was performed identifying the patient, planned procedure, and the appropriate extremity.      The left lower extremity was placed in the [facility] leg holder.  The leg was   exsanguinated, tourniquet elevated to 250 mmHg.  A midline incision was   made followed by median parapatellar arthrotomy.  Following initial   exposure, attention  was first directed to the patella.  Precut   measurement was noted to be 26-27 mm.  I resected down to 15 mm and used a   41 anatomic patellar button to restore patellar height as well as cover the cut surface.      The lug holes were drilled and a metal shim was placed to protect the   patella from retractors and saw blade during the procedure.      At this point, attention was now directed to the femur.  The femoral   canal was opened with a drill, irrigated to try to prevent fat emboli.  An   intramedullary rod was passed at 5 degrees valgus, 9 mm of bone was   resected off the distal femur.  Following this resection, the tibia was   subluxated anteriorly.  Using the extramedullary guide, 2 mm of bone was resected off   the proximal medial tibia.  We confirmed the gap would be   stable medially and laterally with a size 5 spacer block as well as confirmed that the tibial cut was perpendicular in the coronal plane, checking with an alignment rod.      Once this was done, I sized the femur to be a size 6 in the anterior-   posterior dimension, chose a  narrow component based on medial and   lateral dimension.  The size 6 rotation block was then pinned in   position anterior referenced using the C-clamp to set rotation.  The   anterior, posterior, and  chamfer cuts were made without difficulty nor   notching making certain that I was along the anterior cortex to help   with flexion gap stability.      The final box cut was made off the lateral aspect of distal femur.      At this point, the tibia was sized to be a size 7.  The size 7 tray was   then pinned in position through the medial third of the tubercle,   drilled, and keel punched.  Trial reduction was now carried with a 6 femur,  7 tibia, a size 6 mm PS insert, and the 41 anatomic patella botton.  The knee was brought to full extension with good flexion stability with the patella   tracking through the trochlea without application of pressure.  Given   all these findings the trial components removed.  Final components were   opened and cement was mixed.  The knee was irrigated with normal saline solution and pulse lavage.  The synovial lining was   then injected with 30 cc of 0.25% Marcaine with epinephrine, 1 cc of Toradol and 30 cc of NS for a total of 61 cc.     Final implants were then cemented onto cleaned and dried cut surfaces of bone with the knee brought to extension with a size 6 mm PS trial insert.      Once the cement had fully cured, excess cement was removed   throughout the knee.  I confirmed that I was satisfied with the range of   motion and stability, and the final size 6 mm PS AOX insert was chosen.  It was   placed into the knee.      The tourniquet had been let down at 45 minutes.  No significant   hemostasis was required.  The extensor mechanism was then reapproximated using #1 Vicryl and #1 Stratafix sutures with the knee   in flexion.  The  remaining wound was closed with 2-0 Vicryl and running 4-0 Monocryl.   The knee was cleaned, dried, dressed  sterilely using Dermabond and   Aquacel dressing.  The patient was then   brought to recovery room in stable condition, tolerating the procedure   well.   Please note that Physician Assistant, Costella Hatcher, PA-C was present for the entirety of the case, and was utilized for pre-operative positioning, peri-operative retractor management, general facilitation of the procedure and for primary wound closure at the end of the case.              Pietro Cassis Alvan Dame, M.D.    03/05/2021 9:37 AM

## 2021-03-05 NOTE — Care Plan (Signed)
Ortho Bundle Case Management Note  Patient Details  Name: Wayne Ortiz MRN: [id number] Date of Birth: [birth year]  L TKA on 03-05-21 DCP:  Home with son and dtr-in-law.  1 story home with 4 ste. DME:  No needs.  Has a RW and 3-in-1. PT:  [facility] 03-08-21.                    DME Arranged:  [name] DME Agency:  NA  HH Arranged:  NA HH Agency:  NA  Additional Comments: Please contact me with any questions of if this plan should need to change.  Marianne Sofia, RN,CCM [facility]  [phone] 03/05/2021, 7:23 AM

## 2021-03-05 NOTE — Anesthesia Procedure Notes (Addendum)
Procedure Name: MAC Date/Time: 03/05/2021 7:44 PM Performed by: Eben Burow, CRNA Pre-anesthesia Checklist: Patient identified, Emergency Drugs available, Suction available, Patient being monitored and Timeout performed Oxygen Delivery Method: Simple face mask Placement Confirmation: positive ETCO2

## 2021-03-05 NOTE — Evaluation (Signed)
Physical Therapy Evaluation Patient Details Name: ERUBIEL MANASCO MRN: [id number] DOB: [birth year] Today's Date: 03/05/2021  History of Present Illness  Pt is 75 yo male s/p L TKA on 03/05/21.  Pt with hx including but not limited to  R TKA in 2015, afib, obesity, CAD, and HTN.  Clinical Impression  Pt is s/p TKA resulting in the deficits listed below (see PT Problem List). At baseline pt is independent and lives alone.  He plans to stay with daughter at d/c and has necessary DME.  Today, pt did very well with therapy for DOS.  Pt with good pain control, quad activation, and ROM.  Ambulated 58' with RW and min guard.  Expected to progress well. Pt will benefit from skilled PT to increase their independence and safety with mobility to allow discharge to the venue listed below.         Recommendations for follow up therapy are one component of a multi-disciplinary discharge planning process, led by the attending physician.  Recommendations may be updated based on patient status, additional functional criteria and insurance authorization.  Follow Up Recommendations Follow physician's recommendations for discharge plan and follow up therapies    Assistance Recommended at Discharge Intermittent Supervision/Assistance  Functional Status Assessment Patient has had a recent decline in their functional status and demonstrates the ability to make significant improvements in function in a reasonable and predictable amount of time.  Equipment Recommendations  None recommended by PT    Recommendations for Other Services       Precautions / Restrictions Precautions Precautions: Knee Restrictions Weight Bearing Restrictions: Yes LLE Weight Bearing: Weight bearing as tolerated      Mobility  Bed Mobility Overal bed mobility: Needs Assistance Bed Mobility: Supine to Sit     Supine to sit: Supervision          Transfers Overall transfer level: Needs assistance Equipment used: Rolling walker (2  wheels) Transfers: Sit to/from Stand Sit to Stand: Min guard           General transfer comment: cues for hand placement and L LE management    Ambulation/Gait Ambulation/Gait assistance: Min guard Gait Distance (Feet): 80 Feet Assistive device: Rolling walker (2 wheels) Gait Pattern/deviations: Step-through pattern;Decreased stride length;Decreased weight shift to left Gait velocity: decreased     General Gait Details: Did well with min guard for safety; initial cues for RW proximity; mild decrease in weight shift to L  Stairs            Wheelchair Mobility    Modified Rankin (Stroke Patients Only)       Balance Overall balance assessment: Needs assistance Sitting-balance support: No upper extremity supported Sitting balance-Leahy Scale: Normal     Standing balance support: Bilateral upper extremity supported;No upper extremity supported Standing balance-Leahy Scale: Fair Standing balance comment: RW to ambulate but could stand without AD                             Pertinent Vitals/Pain Pain Assessment: 0-10 Pain Score: 4  Pain Location: L knee but more in back (chronic back pain) Pain Descriptors / Indicators: Discomfort Pain Intervention(s): Limited activity within patient's tolerance;Monitored during session;Repositioned;Ice applied;RN gave pain meds during session    [place] expects to be discharged to:: Private residence Living Arrangements: Alone Available Help at Discharge: Family;Available 24 hours/day (plans to stay with dtr and son in law at d/c) Type of Home: House Home Access:  Level entry       Home Layout: One level;Laundry or work area in [place]: Conservation officer, nature (2 wheels);Cane - single point;BSC/3in1;Shower seat - built in;Grab bars - tub/shower Additional Comments: plans to stay with family - above information on daughter's house    Prior Function Prior Level of Function :  Independent/Modified Independent;Driving             Mobility Comments: Pt able to ambulate in community without AD ADLs Comments: Pt independent with ADLS, shopping, IADLS, and driving     Hand Dominance        Extremity/Trunk Assessment   Upper Extremity Assessment Upper Extremity Assessment: Overall WFL for tasks assessed    Lower Extremity Assessment Lower Extremity Assessment: LLE deficits/detail;RLE deficits/detail RLE Deficits / Details: ROM WFL; MMT 5/5 RLE Sensation: decreased light touch;history of peripheral neuropathy LLE Deficits / Details: Expected post op changes; ROM : ankle and hip WFL, knee 0 to 90 degrees; MMT: ankle 5/5, knee and hip at least 3/5 not further tested LLE Sensation: decreased light touch;history of peripheral neuropathy    Cervical / Trunk Assessment Cervical / Trunk Assessment: Normal;Other exceptions Cervical / Trunk Exceptions: reports chronic back pain  Communication   Communication: No difficulties  Cognition Arousal/Alertness: Awake/alert Behavior During Therapy: WFL for tasks assessed/performed Overall Cognitive Status: Within Functional Limits for tasks assessed                                          General Comments General comments (skin integrity, edema, etc.): VSS on RA, left on RA    Exercises     Assessment/Plan    PT Assessment Patient needs continued PT services  PT Problem List Decreased strength;Decreased mobility;Decreased range of motion;Decreased knowledge of precautions;Decreased activity tolerance;Decreased balance;Decreased knowledge of use of DME;Pain       PT Treatment Interventions DME instruction;Therapeutic activities;Modalities;Gait training;Therapeutic exercise;Patient/family education;Stair training;Balance training;Functional mobility training    PT Goals (Current goals can be found in the Care Plan section)  Acute Rehab PT Goals Patient Stated Goal: return home PT Goal  Formulation: With patient Time For Goal Achievement: 03/19/21 Potential to Achieve Goals: Good    Frequency 7X/week   Barriers to discharge        Co-evaluation               AM-PAC PT "6 Clicks" Mobility  Outcome Measure Help needed turning from your back to your side while in a flat bed without using bedrails?: A Little Help needed moving from lying on your back to sitting on the side of a flat bed without using bedrails?: A Little Help needed moving to and from a bed to a chair (including a wheelchair)?: A Little Help needed standing up from a chair using your arms (e.g., wheelchair or bedside chair)?: A Little Help needed to walk in hospital room?: A Little Help needed climbing 3-5 steps with a railing? : A Little 6 Click Score: 18    End of Session Equipment Utilized During Treatment: Gait belt Activity Tolerance: Patient tolerated treatment well Patient left: with call bell/phone within reach;with chair alarm set;in chair Nurse Communication: Mobility status PT Visit Diagnosis: Other abnormalities of gait and mobility (R26.89);Muscle weakness (generalized) (M62.81)    Time: 6295-2841 PT Time Calculation (min) (ACUTE ONLY): 31 min   Charges:   PT Evaluation $PT Eval Low Complexity: 1 Low PT  Treatments $Gait Training: 8-22 mins        Abran Richard, PT Acute Rehab Services Pager [phone] [facility] 03/05/2021, 3:48 PM

## 2021-03-05 NOTE — Plan of Care (Signed)
Plan of care reviewed and discussed with the patient. 

## 2021-03-05 NOTE — Transfer of Care (Signed)
Immediate Anesthesia Transfer of Care Note  Patient: CHETT TANIGUCHI  Procedure(s) Performed: TOTAL KNEE ARTHROPLASTY (Left: Knee)  Patient Location: PACU  Anesthesia Type:Spinal  Level of Consciousness: awake and patient cooperative  Airway & Oxygen Therapy: Patient Spontanous Breathing and Patient connected to face mask oxygen  Post-op Assessment: Report given to RN and Post -op Vital signs reviewed and stable  Post vital signs: Reviewed and stable  Last Vitals:  Vitals Value Taken Time  BP 123/79 03/05/21 1156  Temp 36.5 C 03/05/21 1154  Pulse 106 03/05/21 1158  Resp 14 03/05/21 1158  SpO2 99 % 03/05/21 1158  Vitals shown include unvalidated device data.  Last Pain:  Vitals:   03/05/21 0845  TempSrc:   PainSc: 0-No pain      Patients Stated Pain Goal: 4 (39/53/20 2334)  Complications: No notable events documented.

## 2021-03-05 NOTE — Anesthesia Postprocedure Evaluation (Signed)
Anesthesia Post Note  Patient: Wayne Ortiz  Procedure(s) Performed: TOTAL KNEE ARTHROPLASTY (Left: Knee)     Patient location during evaluation: PACU Anesthesia Type: Regional and Spinal Level of consciousness: oriented and awake and alert Pain management: pain level controlled Vital Signs Assessment: post-procedure vital signs reviewed and stable Respiratory status: spontaneous breathing, respiratory function stable and patient connected to nasal cannula oxygen Cardiovascular status: blood pressure returned to baseline and stable Postop Assessment: no headache, no backache and no apparent nausea or vomiting Anesthetic complications: no   No notable events documented.  Last Vitals:  Vitals:   03/05/21 1300 03/05/21 1329  BP: 102/63 105/71  Pulse: 79 (!) 45  Resp: 11 16  Temp: (!) 36.3 C 36.6 C  SpO2: 96% 98%    Last Pain:  Vitals:   03/05/21 1329  TempSrc: Oral  PainSc:                  Tillie Viverette L Joshuajames Moehring

## 2021-03-05 NOTE — Interval H&P Note (Signed)
History and Physical Interval Note:  03/05/2021 8:08 AM  Wayne Ortiz  has presented today for surgery, with the diagnosis of Left knee osteoarthritis.  The various methods of treatment have been discussed with the patient and family. After consideration of risks, benefits and other options for treatment, the patient has consented to  Procedure(s): TOTAL KNEE ARTHROPLASTY (Left) as a surgical intervention.  The patient's history has been reviewed, patient examined, no change in status, stable for surgery.  I have reviewed the patient's chart and labs.  Questions were answered to the patient's satisfaction.     [name]

## 2021-03-05 NOTE — Plan of Care (Signed)
  Problem: Education: Goal: Knowledge of General Education information will improve Description: Including pain rating scale, medication(s)/side effects and non-pharmacologic comfort measures Outcome: Progressing   Problem: Activity: Goal: Risk for activity intolerance will decrease Outcome: Progressing   Problem: Nutrition: Goal: Adequate nutrition will be maintained Outcome: Progressing   Problem: Pain Managment: Goal: General experience of comfort will improve Outcome: Progressing   Problem: Safety: Goal: Ability to remain free from injury will improve Outcome: Progressing   Problem: Education: Goal: Knowledge of the prescribed therapeutic regimen will improve Outcome: Progressing   Problem: Activity: Goal: Ability to avoid complications of mobility impairment will improve Outcome: Progressing Goal: Range of joint motion will improve Outcome: Progressing   Problem: Pain Management: Goal: Pain level will decrease with appropriate interventions Outcome: Progressing

## 2021-03-05 NOTE — Discharge Instructions (Addendum)
INSTRUCTIONS AFTER JOINT REPLACEMENT   PAIN MANAGEMENT: - We will manage early post-op pain (first 4-5 weeks). During this time, do not fill your prescription for Norco 10 from your pain management provider or you may be unable to receive our medications.   - We will utilize Oxycodone in this time frame, which can be filled on a 5-7 day basis as needed. Please call when you are running low and we will refill.   Remove items at home which could result in a fall. This includes throw rugs or furniture in walking pathways ICE to the affected joint every three hours while awake for 30 minutes at a time, for at least the first 3-5 days, and then as needed for pain and swelling.  Continue to use ice for pain and swelling. You may notice swelling that will progress down to the foot and ankle.  This is normal after surgery.  Elevate your leg when you are not up walking on it.   Continue to use the breathing machine you got in the hospital (incentive spirometer) which will help keep your temperature down.  It is common for your temperature to cycle up and down following surgery, especially at night when you are not up moving around and exerting yourself.  The breathing machine keeps your lungs expanded and your temperature down.   DIET:  As you were doing prior to hospitalization, we recommend a well-balanced diet.  DRESSING / WOUND CARE / SHOWERING  Keep the surgical dressing until follow up.  The dressing is water proof, so you can shower without any extra covering.  IF THE DRESSING FALLS OFF or the wound gets wet inside, change the dressing with sterile gauze.  Please use good hand washing techniques before changing the dressing.  Do not use any lotions or creams on the incision until instructed by your surgeon.    ACTIVITY  Increase activity slowly as tolerated, but follow the weight bearing instructions below.   No driving for 6 weeks or until further direction given by your physician.  You cannot  drive while taking narcotics.  No lifting or carrying greater than 10 lbs. until further directed by your surgeon. Avoid periods of inactivity such as sitting longer than an hour when not asleep. This helps prevent blood clots.  You may return to work once you are authorized by your doctor.     WEIGHT BEARING   Weight bearing as tolerated with assist device (walker, cane, etc) as directed, use it as long as suggested by your surgeon or therapist, typically at least 4-6 weeks.   EXERCISES  Results after joint replacement surgery are often greatly improved when you follow the exercise, range of motion and muscle strengthening exercises prescribed by your doctor. Safety measures are also important to protect the joint from further injury. Any time any of these exercises cause you to have increased pain or swelling, decrease what you are doing until you are comfortable again and then slowly increase them. If you have problems or questions, call your caregiver or physical therapist for advice.   Rehabilitation is important following a joint replacement. After just a few days of immobilization, the muscles of the leg can become weakened and shrink (atrophy).  These exercises are designed to build up the tone and strength of the thigh and leg muscles and to improve motion. Often times heat used for twenty to thirty minutes before working out will loosen up your tissues and help with improving the range of motion  but do not use heat for the first two weeks following surgery (sometimes heat can increase post-operative swelling).   These exercises can be done on a training (exercise) mat, on the floor, on a table or on a bed. Use whatever works the best and is most comfortable for you.    Use music or television while you are exercising so that the exercises are a pleasant break in your day. This will make your life better with the exercises acting as a break in your routine that you can look forward to.    Perform all exercises about fifteen times, three times per day or as directed.  You should exercise both the operative leg and the other leg as well.  Exercises include:   Quad Sets - Tighten up the muscle on the front of the thigh (Quad) and hold for 5-10 seconds.   Straight Leg Raises - With your knee straight (if you were given a brace, keep it on), lift the leg to 60 degrees, hold for 3 seconds, and slowly lower the leg.  Perform this exercise against resistance later as your leg gets stronger.  Leg Slides: Lying on your back, slowly slide your foot toward your buttocks, bending your knee up off the floor (only go as far as is comfortable). Then slowly slide your foot back down until your leg is flat on the floor again.  Angel Wings: Lying on your back spread your legs to the side as far apart as you can without causing discomfort.  Hamstring Strength:  Lying on your back, push your heel against the floor with your leg straight by tightening up the muscles of your buttocks.  Repeat, but this time bend your knee to a comfortable angle, and push your heel against the floor.  You may put a pillow under the heel to make it more comfortable if necessary.   A rehabilitation program following joint replacement surgery can speed recovery and prevent re-injury in the future due to weakened muscles. Contact your doctor or a physical therapist for more information on knee rehabilitation.    CONSTIPATION  Constipation is defined medically as fewer than three stools per week and severe constipation as less than one stool per week.  Even if you have a regular bowel pattern at home, your normal regimen is likely to be disrupted due to multiple reasons following surgery.  Combination of anesthesia, postoperative narcotics, change in appetite and fluid intake all can affect your bowels.   YOU MUST use at least one of the following options; they are listed in order of increasing strength to get the job done.   They are all available over the counter, and you may need to use some, POSSIBLY even all of these options:    Drink plenty of fluids (prune juice may be helpful) and high fiber foods Colace 100 mg by mouth twice a day  Senokot for constipation as directed and as needed Dulcolax (bisacodyl), take with full glass of water  Miralax (polyethylene glycol) once or twice a day as needed.  If you have tried all these things and are unable to have a bowel movement in the first 3-4 days after surgery call either your surgeon or your primary doctor.    If you experience loose stools or diarrhea, hold the medications until you stool forms back up.  If your symptoms do not get better within 1 week or if they get worse, check with your doctor.  If you experience "the worst  abdominal pain ever" or develop nausea or vomiting, please contact the office immediately for further recommendations for treatment.   ITCHING:  If you experience itching with your medications, try taking only a single pain pill, or even half a pain pill at a time.  You can also use Benadryl over the counter for itching or also to help with sleep.   TED HOSE STOCKINGS:  Use stockings on both legs until for at least 2 weeks or as directed by physician office. They may be removed at night for sleeping.  MEDICATIONS:  See your medication summary on the "After Visit Summary" that nursing will review with you.  You may have some home medications which will be placed on hold until you complete the course of blood thinner medication.  It is important for you to complete the blood thinner medication as prescribed.  PRECAUTIONS:  If you experience chest pain or shortness of breath - call 911 immediately for transfer to the hospital emergency department.   If you develop a fever greater that 101 F, purulent drainage from wound, increased redness or drainage from wound, foul odor from the wound/dressing, or calf pain - CONTACT YOUR SURGEON.                                                    FOLLOW-UP APPOINTMENTS:  If you do not already have a post-op appointment, please call the office for an appointment to be seen by your surgeon.  Guidelines for how soon to be seen are listed in your "After Visit Summary", but are typically between 1-4 weeks after surgery.  OTHER INSTRUCTIONS:   Knee Replacement:  Do not place pillow under knee, focus on keeping the knee straight while resting. CPM instructions: 0-90 degrees, 2 hours in the morning, 2 hours in the afternoon, and 2 hours in the evening. Place foam block, curve side up under heel at all times except when in CPM or when walking.  DO NOT modify, tear, cut, or change the foam block in any way.  POST-OPERATIVE OPIOID TAPER INSTRUCTIONS: It is important to wean off of your opioid medication as soon as possible. If you do not need pain medication after your surgery it is ok to stop day one. Opioids include: Codeine, Hydrocodone(Norco, Vicodin), Oxycodone(Percocet, oxycontin) and hydromorphone amongst others.  Long term and even short term use of opiods can cause: Increased pain response Dependence Constipation Depression Respiratory depression And more.  Withdrawal symptoms can include Flu like symptoms Nausea, vomiting And more Techniques to manage these symptoms Hydrate well Eat regular healthy meals Stay active Use relaxation techniques(deep breathing, meditating, yoga) Do Not substitute Alcohol to help with tapering If you have been on opioids for less than two weeks and do not have pain than it is ok to stop all together.  Plan to wean off of opioids This plan should start within one week post op of your joint replacement. Maintain the same interval or time between taking each dose and first decrease the dose.  Cut the total daily intake of opioids by one tablet each day Next start to increase the time between doses. The last dose that should be eliminated is the evening dose.    MAKE SURE YOU:  Understand these instructions.  Get help right away if you are not doing well or get worse.  Thank you for letting us be a part of your medical care team.  It is a privilege we respect greatly.  We hope these instructions will help you stay on track for a fast and full recovery!

## 2021-03-05 NOTE — Anesthesia Procedure Notes (Signed)
Spinal  Patient location during procedure: OR Start time: 03/05/2021 9:50 AM End time: 03/05/2021 9:54 AM Reason for block: surgical anesthesia Staffing Performed: anesthesiologist  Anesthesiologist: Freddrick March, MD Preanesthetic Checklist Completed: patient identified, IV checked, risks and benefits discussed, surgical consent, monitors and equipment checked, pre-op evaluation and timeout performed Spinal Block Patient position: sitting Prep: DuraPrep and site prepped and draped Patient monitoring: cardiac monitor, continuous pulse ox and blood pressure Approach: midline Location: L3-4 Injection technique: single-shot Needle Needle type: Pencan  Needle gauge: 24 G Needle length: 9 cm Assessment Sensory level: T6 Events: CSF return and second provider Additional Notes Functioning IV was confirmed and monitors were applied. Sterile prep and drape, including hand hygiene and sterile gloves were used. The patient was positioned and the spine was prepped. The skin was anesthetized with lidocaine.  Free flow of clear CSF was obtained prior to injecting local anesthetic into the CSF.  The spinal needle aspirated freely following injection.  The needle was carefully withdrawn.  The patient tolerated the procedure well.   1 attempt by CRNA unsuccessful. 2nd attempt at L3/L4 successful.

## 2021-03-06 ENCOUNTER — Encounter (HOSPITAL_COMMUNITY): Payer: Self-pay | Admitting: Orthopedic Surgery

## 2021-03-06 DIAGNOSIS — M1712 Unilateral primary osteoarthritis, left knee: Secondary | ICD-10-CM | POA: Diagnosis not present

## 2021-03-06 DIAGNOSIS — E039 Hypothyroidism, unspecified: Secondary | ICD-10-CM | POA: Diagnosis not present

## 2021-03-06 DIAGNOSIS — Z8616 Personal history of COVID-19: Secondary | ICD-10-CM | POA: Diagnosis not present

## 2021-03-06 DIAGNOSIS — Z955 Presence of coronary angioplasty implant and graft: Secondary | ICD-10-CM | POA: Diagnosis not present

## 2021-03-06 DIAGNOSIS — I4819 Other persistent atrial fibrillation: Secondary | ICD-10-CM | POA: Diagnosis not present

## 2021-03-06 DIAGNOSIS — Z7901 Long term (current) use of anticoagulants: Secondary | ICD-10-CM | POA: Diagnosis not present

## 2021-03-06 DIAGNOSIS — Z96651 Presence of right artificial knee joint: Secondary | ICD-10-CM | POA: Diagnosis not present

## 2021-03-06 DIAGNOSIS — I2510 Atherosclerotic heart disease of native coronary artery without angina pectoris: Secondary | ICD-10-CM | POA: Diagnosis not present

## 2021-03-06 DIAGNOSIS — I10 Essential (primary) hypertension: Secondary | ICD-10-CM | POA: Diagnosis not present

## 2021-03-06 DIAGNOSIS — Z87891 Personal history of nicotine dependence: Secondary | ICD-10-CM | POA: Diagnosis not present

## 2021-03-06 LAB — CBC
HCT: 45.4 % (ref 39.0–52.0)
Hemoglobin: 14.7 g/dL (ref 13.0–17.0)
MCH: 33.6 pg (ref 26.0–34.0)
MCHC: 32.4 g/dL (ref 30.0–36.0)
MCV: 103.9 fL — ABNORMAL HIGH (ref 80.0–100.0)
Platelets: 137 10*3/uL — ABNORMAL LOW (ref 150–400)
RBC: 4.37 MIL/uL (ref 4.22–5.81)
RDW: 13.3 % (ref 11.5–15.5)
WBC: 12.8 10*3/uL — ABNORMAL HIGH (ref 4.0–10.5)
nRBC: 0 % (ref 0.0–0.2)

## 2021-03-06 LAB — BASIC METABOLIC PANEL
Anion gap: 8 (ref 5–15)
BUN: 26 mg/dL — ABNORMAL HIGH (ref 8–23)
CO2: 27 mmol/L (ref 22–32)
Calcium: 8.8 mg/dL — ABNORMAL LOW (ref 8.9–10.3)
Chloride: 102 mmol/L (ref 98–111)
Creatinine, Ser: 1.15 mg/dL (ref 0.61–1.24)
GFR, Estimated: >60 mL/min (ref >60)
Glucose, Bld: 153 mg/dL — ABNORMAL HIGH (ref 70–99)
Potassium: 4.5 mmol/L (ref 3.5–5.1)
Sodium: 137 mmol/L (ref 135–145)

## 2021-03-06 MED ORDER — CYCLOBENZAPRINE HCL 5 MG PO TABS: 5.0000 mg | ORAL_TABLET | Freq: Three times a day (TID) | ORAL | 0 refills | Status: DC | PRN

## 2021-03-06 MED ORDER — OXYCODONE HCL 5 MG PO TABS: 5.0000 mg | ORAL_TABLET | ORAL | 0 refills | Status: DC | PRN

## 2021-03-06 MED ORDER — POLYETHYLENE GLYCOL 3350 17 G PO PACK: 17.0000 g | PACK | Freq: Every day | ORAL | 0 refills | Status: DC | PRN

## 2021-03-06 NOTE — Progress Notes (Signed)
Physical Therapy Treatment Patient Details Name: Wayne Ortiz MRN: [id number] DOB: 1945-11-03 Today's Date: 03/06/2021   History of Present Illness Pt is 75 yo male s/p L TKA on 03/05/21.  Pt with hx including but not limited to  R TKA in 2015, afib, obesity, CAD, and HTN.    PT Comments    Pt is POD # 1 and is progressing well.  Pt ambulated 100' with RW and performed 2 steps with cues.  He had good pain control, knee ROM 5 to 90degrees, and good quad activation.  Pt with support at home. Pt demonstrates safe gait & transfers in order to return home from PT perspective once discharged by MD.  While in hospital, will continue to benefit from PT for skilled therapy to advance mobility and exercises.      Recommendations for follow up therapy are one component of a multi-disciplinary discharge planning process, led by the attending physician.  Recommendations may be updated based on patient status, additional functional criteria and insurance authorization.  Follow Up Recommendations  Follow physician's recommendations for discharge plan and follow up therapies     Assistance Recommended at Discharge Intermittent Supervision/Assistance  Equipment Recommendations  None recommended by PT    Recommendations for Other Services       Precautions / Restrictions Precautions Precautions: Knee Restrictions Weight Bearing Restrictions: No LLE Weight Bearing: Weight bearing as tolerated     Mobility  Bed Mobility               General bed mobility comments: in chair at arrival; educated on gait belt for AAROM L leg on/off bed    Transfers Overall transfer level: Needs assistance Equipment used: Rolling walker (2 wheels) Transfers: Sit to/from Stand Sit to Stand: Supervision           General transfer comment: Min cues for L LE management for sitting low surfaces.  Performed x 3    Ambulation/Gait Ambulation/Gait assistance: Supervision Gait Distance (Feet): 100  Feet Assistive device: Rolling walker (2 wheels) Gait Pattern/deviations: Step-through pattern;Decreased stride length;Decreased weight shift to left Gait velocity: decreased     General Gait Details: Mild decrease in weight shift to L; steady step through pattern   Stairs Stairs: Yes Stairs assistance: Min guard Stair Management: Two rails;Step to pattern;Forwards Number of Stairs: 2 General stair comments: cued for sequence   Wheelchair Mobility    Modified Rankin (Stroke Patients Only)       Balance Overall balance assessment: Needs assistance Sitting-balance support: No upper extremity supported Sitting balance-Leahy Scale: Normal     Standing balance support: Bilateral upper extremity supported;No upper extremity supported Standing balance-Leahy Scale: Fair Standing balance comment: RW to ambulate but could stand/ADLs without AD                            Cognition Arousal/Alertness: Awake/alert Behavior During Therapy: WFL for tasks assessed/performed Overall Cognitive Status: Within Functional Limits for tasks assessed                                          Exercises Total Joint Exercises Ankle Circles/Pumps: AROM;Both;20 reps;Seated Quad Sets: AROM;Left;10 reps;Seated Towel Squeeze: AROM;Both;10 reps;Seated Heel Slides: AROM;Left;10 reps;Seated Hip ABduction/ADduction: AROM;Left;10 reps;Seated Long Arc Quad: AROM;Left;10 reps;Seated Knee Flexion: AROM;Left;10 reps;Seated Goniometric ROM: L knee 5 to 90 degrees Other Exercises Other Exercises:  on all educated on correct form, holding as needed, and provided HEP    General Comments  Educated on safe ice use, no pivots, car transfers, resting with leg straight, and TED hose during day. Also, encouraged walking every 1-2 hours during day. Educated on HEP with focus on mobility the first weeks. Discussed doing exercises within pain control and if pain increasing could decreased  ROM, reps, and stop exercises as needed. Encouraged to perform quad sets and ankle pumps frequently for blood flow and to promote full knee extension.      Pertinent Vitals/Pain Pain Assessment: 0-10 Pain Score: 4  Pain Location: L knee with exercise Pain Descriptors / Indicators: Burning Pain Intervention(s): Limited activity within patient's tolerance;Monitored during session;Premedicated before session;Ice applied    Home Living                          Prior Function            PT Goals (current goals can now be found in the care plan section) Progress towards PT goals: Progressing toward goals    Frequency    7X/week      PT Plan Current plan remains appropriate    Co-evaluation              AM-PAC PT "6 Clicks" Mobility   Outcome Measure  Help needed turning from your back to your side while in a flat bed without using bedrails?: A Little Help needed moving from lying on your back to sitting on the side of a flat bed without using bedrails?: A Little Help needed moving to and from a bed to a chair (including a wheelchair)?: A Little Help needed standing up from a chair using your arms (e.g., wheelchair or bedside chair)?: A Little Help needed to walk in hospital room?: A Little Help needed climbing 3-5 steps with a railing? : A Little 6 Click Score: 18    End of Session Equipment Utilized During Treatment: Gait belt Activity Tolerance: Patient tolerated treatment well Patient left: with call bell/phone within reach;with chair alarm set;in chair Nurse Communication: Mobility status PT Visit Diagnosis: Other abnormalities of gait and mobility (R26.89);Muscle weakness (generalized) (M62.81)     Time: 7322-0254 PT Time Calculation (min) (ACUTE ONLY): 23 min  Charges:  $Gait Training: 8-22 mins $Therapeutic Exercise: 8-22 mins                     Abran Richard, PT Acute Rehab Services Pager [phone] [facility] 03/06/2021, 11:09 AM

## 2021-03-06 NOTE — TOC Transition Note (Signed)
Transition of Care [facility]) - CM/SW Discharge Note   Patient Details  Name: Wayne Ortiz MRN: [id number] Date of Birth: [birth year]  Transition of Care [facility] CM/SW Contact:  Lennart Pall, LCSW Phone Number: 03/06/2021, 9:36 AM   Clinical Narrative:     Met with pt and confirming he has all needed DME at home.  Plan for OPPT at [facility] in [place].  No TOC needs.  Final next level of care: OP Rehab Barriers to Discharge: No Barriers Identified   Patient Goals and CMS Choice Patient states their goals for this hospitalization and ongoing recovery are:: return home      Discharge Placement                       Discharge Plan and Services                DME Arranged: [name] DME Agency: NA       HH Arranged: NA HH Agency: NA        Social Determinants of Health (SDOH) Interventions     Readmission Risk Interventions No flowsheet data found.

## 2021-03-06 NOTE — Plan of Care (Signed)
Discharge instructions given to the patient. °

## 2021-03-06 NOTE — Plan of Care (Signed)
  Problem: Clinical Measurements: Goal: Ability to maintain clinical measurements within normal limits will improve Outcome: Progressing   Problem: Activity: Goal: Risk for activity intolerance will decrease Outcome: Progressing   Problem: Pain Managment: Goal: General experience of comfort will improve Outcome: Progressing   

## 2021-03-06 NOTE — Progress Notes (Signed)
Subjective: 1 Day Post-Op Procedure(s) (LRB): TOTAL KNEE ARTHROPLASTY (Left) Patient reports pain as mild.   Patient seen in rounds with Dr. Alvan Dame. Patient is well, and has had no acute complaints or problems. No acute events overnight. Foley catheter removed. Patient ambulated 80 feet with PT.  We will continue therapy today.   Objective: Vital signs in last 24 hours: Temp:  [97.4 F (36.3 C)-98.3 F (36.8 C)] 98 F (36.7 C) (11/09 0510) Pulse Rate:  [45-106] 51 (11/09 0510) Resp:  [10-18] 16 (11/09 0510) BP: (100-141)/(63-108) 128/82 (11/09 0510) SpO2:  [92 %-99 %] 99 % (11/09 0510) Weight:  [250 kg] 117 kg (11/08 1329)  Intake/Output from previous day:  Intake/Output Summary (Last 24 hours) at 03/06/2021 0733 Last data filed at 03/06/2021 0609 Gross per 24 hour  Intake 4408.63 ml  Output 1600 ml  Net 2808.63 ml     Intake/Output this shift: No intake/output data recorded.  Labs: Recent Labs    03/05/21 0747 03/06/21 0322  HGB 16.8 14.7   Recent Labs    03/05/21 0747 03/06/21 0322  WBC 5.2 12.8*  RBC 4.90 4.37  HCT 50.4 45.4  PLT 129* 137*   Recent Labs    03/06/21 0322  NA 137  K 4.5  CL 102  CO2 27  BUN 26*  CREATININE 1.15  GLUCOSE 153*  CALCIUM 8.8*   No results for input(s): LABPT, INR in the last 72 hours.  Exam: General - Patient is Alert and Oriented Extremity - Neurologically intact Sensation intact distally Intact pulses distally Dorsiflexion/Plantar flexion intact Dressing - dressing C/D/I Motor Function - intact, moving foot and toes well on exam.   Past Medical History:  Diagnosis Date   Arthritis    Atrial fibrillation (HCC)    BPH (benign prostatic hypertrophy)    CAD (coronary artery disease)    a. s/p DES 05/2014 at [facility])    Complication of anesthesia    BECAME COMBATIVE   Dyspnea    Dysrhythmia    Femur fracture (HCC)    GERD (gastroesophageal reflux disease)    occasional TUMS   Gout    History  of gout    History of kidney stones    Hypertension    Hypothyroidism    Persistent atrial fibrillation (HCC)    Precancerous skin lesion    Renal disorder     Assessment/Plan: 1 Day Post-Op Procedure(s) (LRB): TOTAL KNEE ARTHROPLASTY (Left) Active Problems:   S/P total knee arthroplasty, left  Estimated body mass index is 34.98 kg/m as calculated from the following:   Height as of this encounter: 6' (1.829 m).   Weight as of this encounter: 117 kg. Advance diet Up with therapy D/C IV fluids   Patient's anticipated LOS is less than 2 midnights, meeting these requirements: - Younger than 22 - Lives within 1 hour of care - Has a competent adult at home to recover with post-op recover - NO history of  - Chronic pain requiring opiods  - Diabetes  - Coronary Artery Disease  - Heart failure  - Heart attack  - Stroke  - DVT/VTE  - Cardiac arrhythmia  - Respiratory Failure/COPD  - Renal failure  - Anemia  - Advanced Liver disease     DVT Prophylaxis -  Eliquis Weight bearing as tolerated.  Patient is on chronic pain management with Norco. Last fill was 01/31/21, Norco 10 #120. He will not fill this again until after we finish early  post-op pain control.   Plan is to go Home after hospital stay. Plan for discharge today following 1-2 sessions of PT as long as they are meeting their goals. Patient is scheduled for OPPT. Follow up in the office in 2 weeks.   Griffith Citron, PA-C Orthopedic Surgery [phone] 03/06/2021, 7:33 AM

## 2021-03-07 MED ORDER — DEXAMETHASONE SODIUM PHOSPHATE 10 MG/ML IJ SOLN
INTRAMUSCULAR | Status: DC | PRN
  Administered 2021-03-05: 5 mg

## 2021-03-07 MED ORDER — ROPIVACAINE HCL 5 MG/ML IJ SOLN
INTRAMUSCULAR | Status: DC | PRN
  Administered 2021-03-05: 20 mL via PERINEURAL

## 2021-03-07 NOTE — Addendum Note (Signed)
Addendum  created 03/07/21 0735 by Freddrick March, MD   Child order released for a procedure order, Clinical Note Signed, Intraprocedure Blocks edited, Intraprocedure Meds edited, SmartForm saved

## 2021-03-07 NOTE — Anesthesia Procedure Notes (Addendum)
Anesthesia Regional Block: Adductor canal block   Pre-Anesthetic Checklist: , timeout performed,  Correct Patient, Correct Site, Correct Laterality,  Correct Procedure, Correct Position, site marked,  Risks and benefits discussed,  Surgical consent,  Pre-op evaluation,  At surgeon's request and post-op pain management  Laterality: Left  Prep: Maximum Sterile Barrier Precautions used, chloraprep       Needles:  Injection technique: Single-shot  Needle Type: Echogenic Stimulator Needle     Needle Length: 9cm  Needle Gauge: 22     Additional Needles:   Procedures:,,,, ultrasound used (permanent image in chart),,    Narrative:  Start time: 03/05/2021 9:05 AM End time: 03/05/2021 9:10 AM Injection made incrementally with aspirations every 5 mL.  Performed by: Personally  Anesthesiologist: Freddrick March, MD  Additional Notes: Monitors applied. No increased pain on injection. No increased resistance to injection. Injection made in 5cc increments. Good needle visualization. Patient tolerated procedure well.

## 2021-03-12 DIAGNOSIS — R2689 Other abnormalities of gait and mobility: Secondary | ICD-10-CM | POA: Diagnosis not present

## 2021-03-12 DIAGNOSIS — Z96652 Presence of left artificial knee joint: Secondary | ICD-10-CM | POA: Diagnosis not present

## 2021-03-12 DIAGNOSIS — M25562 Pain in left knee: Secondary | ICD-10-CM | POA: Diagnosis not present

## 2021-03-12 DIAGNOSIS — R531 Weakness: Secondary | ICD-10-CM | POA: Diagnosis not present

## 2021-03-12 DIAGNOSIS — M25462 Effusion, left knee: Secondary | ICD-10-CM | POA: Diagnosis not present

## 2021-03-14 DIAGNOSIS — R531 Weakness: Secondary | ICD-10-CM | POA: Diagnosis not present

## 2021-03-14 DIAGNOSIS — Z96652 Presence of left artificial knee joint: Secondary | ICD-10-CM | POA: Diagnosis not present

## 2021-03-14 DIAGNOSIS — M25462 Effusion, left knee: Secondary | ICD-10-CM | POA: Diagnosis not present

## 2021-03-14 DIAGNOSIS — M25562 Pain in left knee: Secondary | ICD-10-CM | POA: Diagnosis not present

## 2021-03-14 DIAGNOSIS — R2689 Other abnormalities of gait and mobility: Secondary | ICD-10-CM | POA: Diagnosis not present

## 2021-03-15 NOTE — Discharge Summary (Signed)
Physician Discharge Summary   Patient ID: [name] MRN: [id number] DOB/AGE: [birth year] 76 y.o.  Admit date: 03/05/2021 Discharge date: 03/06/2021  Primary Diagnosis: Left knee osteoarthritis.  Admission Diagnoses:  Past Medical History:  Diagnosis Date   Arthritis    Atrial fibrillation (HCC)    BPH (benign prostatic hypertrophy)    CAD (coronary artery disease)    a. s/p DES 05/2014 at [facility])    Complication of anesthesia    BECAME COMBATIVE   Dyspnea    Dysrhythmia    Femur fracture (HCC)    GERD (gastroesophageal reflux disease)    occasional TUMS   Gout    History of gout    History of kidney stones    Hypertension    Hypothyroidism    Persistent atrial fibrillation (HCC)    Precancerous skin lesion    Renal disorder    Discharge Diagnoses:   Active Problems:   S/P total knee arthroplasty, left  Estimated body mass index is 34.98 kg/m as calculated from the following:   Height as of this encounter: 6' (1.829 m).   Weight as of this encounter: 117 kg.  Procedure:  Procedure(s) (LRB): TOTAL KNEE ARTHROPLASTY (Left)   Consults: None  HPI: Wayne Ortiz is a 75 y.o. male patient of   mine.  The patient had been seen, evaluated, and treated for months conservatively in the   office with medication, activity modification, and injections.  The patient had   radiographic changes of bone-on-bone arthritis with endplate sclerosis and osteophytes noted.  Based on the radiographic changes and failed conservative measures, the patient   decided to proceed with definitive treatment, total knee replacement.  Risks of infection, DVT, component failure, need for revision surgery, neurovascular injury were reviewed in the office setting.  The postop course was reviewed stressing the efforts to maximize post-operative satisfaction and function.  Consent was obtained for benefit of pain   relief.   Laboratory Data: Admission on 03/05/2021, Discharged on  03/06/2021  Component Date Value Ref Range Status   WBC 03/05/2021 5.2  4.0 - 10.5 K/uL Final   RBC 03/05/2021 4.90  4.22 - 5.81 MIL/uL Final   Hemoglobin 03/05/2021 16.8  13.0 - 17.0 g/dL Final   HCT 03/05/2021 50.4  39.0 - 52.0 % Final   MCV 03/05/2021 102.9 (H)  80.0 - 100.0 fL Final   MCH 03/05/2021 34.3 (H)  26.0 - 34.0 pg Final   MCHC 03/05/2021 33.3  30.0 - 36.0 g/dL Final   RDW 03/05/2021 13.6  11.5 - 15.5 % Final   Platelets 03/05/2021 129 (L)  150 - 400 K/uL Final   Comment: Immature Platelet Fraction may be clinically indicated, consider ordering this additional test [id number]    nRBC 03/05/2021 0.0  0.0 - 0.2 % Final   Performed at [facility], [place] [address], [place], [place] [address]   WBC 03/06/2021 12.8 (H)  4.0 - 10.5 K/uL Final   RBC 03/06/2021 4.37  4.22 - 5.81 MIL/uL Final   Hemoglobin 03/06/2021 14.7  13.0 - 17.0 g/dL Final   HCT 03/06/2021 45.4  39.0 - 52.0 % Final   MCV 03/06/2021 103.9 (H)  80.0 - 100.0 fL Final   MCH 03/06/2021 33.6  26.0 - 34.0 pg Final   MCHC 03/06/2021 32.4  30.0 - 36.0 g/dL Final   RDW 03/06/2021 13.3  11.5 - 15.5 % Final   Platelets 03/06/2021 137 (L)  150 - 400  K/uL Final   Comment: CONSISTENT WITH PREVIOUS RESULT REPEATED TO VERIFY    nRBC 03/06/2021 0.0  0.0 - 0.2 % Final   Performed at [facility], [place] [address] [place], [place] [address]   Sodium 03/06/2021 137  135 - 145 mmol/L Final   Potassium 03/06/2021 4.5  3.5 - 5.1 mmol/L Final   Chloride 03/06/2021 102  98 - 111 mmol/L Final   CO2 03/06/2021 27  22 - 32 mmol/L Final   Glucose, Bld 03/06/2021 153 (H)  70 - 99 mg/dL Final   Glucose reference range applies only to samples taken after fasting for at least 8 hours.   BUN 03/06/2021 26 (H)  8 - 23 mg/dL Final   Creatinine, Ser 03/06/2021 1.15  0.61 - 1.24 mg/dL Final   Calcium 03/06/2021 8.8 (L)  8.9 - 10.3 mg/dL Final   GFR, Estimated 03/06/2021 >60  >60 mL/min Final   Comment:  (NOTE) Calculated using the CKD-EPI Creatinine Equation (2021)    Anion gap 03/06/2021 8  5 - 15 Final   Performed at [facility], [place] [address] [place], [place] [address]  Orders Only on 03/01/2021  Component Date Value Ref Range Status   SARS Coronavirus 2 03/01/2021 RESULT: NEGATIVE   Final   Comment: RESULT: NEGATIVESARS-CoV-2 INTERPRETATION:A NEGATIVE  test result means that SARS-CoV-2 RNA was not present in the specimen above the limit of detection of this test. This does not preclude a possible SARS-CoV-2 infection and should not be used as the  sole basis for patient management decisions. Negative results must be combined with clinical observations, patient history, and epidemiological information. Optimum specimen types and timing for peak viral levels during infections caused by SARS-CoV-2  have not been determined. Collection of multiple specimens or types of specimens may be necessary to detect virus. Improper specimen collection and handling, sequence variability under primers/probes, or organism present below the limit of detection may  lead to false negative results. Positive and negative predictive values of testing are highly dependent on prevalence. False negative test results are more likely when prevalence of disease is high.The expected result is NEGATIVE.Fact S                          heet for  Healthcare Providers: LocalChronicle.no Sheet for Patients: SalonLookup.es Reference Range - Negative   Hospital Outpatient Visit on 02/27/2021  Component Date Value Ref Range Status   ABO/RH(D) 02/27/2021 A POS   Final   Antibody Screen 02/27/2021 NEG   Final   Sample Expiration 02/27/2021 03/08/2021,2359   Final   Extend sample reason 02/27/2021    Final                   Value:NO TRANSFUSIONS OR PREGNANCY IN THE PAST 3 MONTHS Performed at [facility], [place] [address]  [place], [place] [address]      X-Rays:No results found.  EKG: Orders placed or performed during the hospital encounter of 03/02/19   ED EKG 12-Lead   ED EKG 12-Lead   EKG 12-Lead   EKG 12-Lead   EKG     Hospital Course: Wayne Ortiz is a 75 y.o. who was admitted to [facility]. They were brought to the operating room on 03/05/2021 and underwent Procedure(s): TOTAL KNEE ARTHROPLASTY.  Patient tolerated the procedure well and was later transferred to the recovery room and then to the orthopaedic floor for postoperative  care. They were given PO and IV analgesics for pain control following their surgery. They were given 24 hours of postoperative antibiotics of  Anti-infectives (From admission, onward)    Start     Dose/Rate Route Frequency Ordered Stop   03/05/21 1600  ceFAZolin (ANCEF) IVPB 2g/100 mL premix        2 g 200 mL/hr over 30 Minutes Intravenous Every 6 hours 03/05/21 1333 03/05/21 2257   03/05/21 0745  ceFAZolin (ANCEF) IVPB 2g/100 mL premix        2 g 200 mL/hr over 30 Minutes Intravenous On call to O.R. 03/05/21 0730 03/05/21 1003      and started on DVT prophylaxis in the form of  Eliquis .   PT and OT were ordered for total joint protocol. Discharge planning consulted to help with postop disposition and equipment needs.  Patient had a good night on the evening of surgery. They started to get up OOB with therapy on POD #0. Pt was seen during rounds and was ready to go home pending progress with therapy.H worked with therapy on POD #1 and was meeting his goals. Pt was discharged to home later that day in stable condition.  Diet: Regular diet Activity: WBAT Follow-up: in 2 weeks Disposition: Home Discharged Condition: good   Discharge Instructions     Call MD / Call 911   Complete by: As directed    If you experience chest pain or shortness of breath, CALL 911 and be transported to the hospital emergency room.  If you develope a fever above 101 F, pus (white  drainage) or increased drainage or redness at the wound, or calf pain, call your surgeon's office.   Change dressing   Complete by: As directed    Maintain surgical dressing until follow up in the clinic. If the edges start to pull up, may reinforce with tape. If the dressing is no longer working, may remove and cover with gauze and tape, but must keep the area dry and clean.  Call with any questions or concerns.   Constipation Prevention   Complete by: As directed    Drink plenty of fluids.  Prune juice may be helpful.  You may use a stool softener, such as Colace (over the counter) 100 mg twice a day.  Use MiraLax (over the counter) for constipation as needed.   Diet - low sodium heart healthy   Complete by: As directed    Increase activity slowly as tolerated   Complete by: As directed    Weight bearing as tolerated with assist device (walker, cane, etc) as directed, use it as long as suggested by your surgeon or therapist, typically at least 4-6 weeks.   Post-operative opioid taper instructions:   Complete by: As directed    POST-OPERATIVE OPIOID TAPER INSTRUCTIONS: It is important to wean off of your opioid medication as soon as possible. If you do not need pain medication after your surgery it is ok to stop day one. Opioids include: Codeine, Hydrocodone(Norco, Vicodin), Oxycodone(Percocet, oxycontin) and hydromorphone amongst others.  Long term and even short term use of opiods can cause: Increased pain response Dependence Constipation Depression Respiratory depression And more.  Withdrawal symptoms can include Flu like symptoms Nausea, vomiting And more Techniques to manage these symptoms Hydrate well Eat regular healthy meals Stay active Use relaxation techniques(deep breathing, meditating, yoga) Do Not substitute Alcohol to help with tapering If you have been on opioids for less than two weeks and do not have  pain than it is ok to stop all together.  Plan to wean off of  opioids This plan should start within one week post op of your joint replacement. Maintain the same interval or time between taking each dose and first decrease the dose.  Cut the total daily intake of opioids by one tablet each day Next start to increase the time between doses. The last dose that should be eliminated is the evening dose.      TED hose   Complete by: As directed    Use stockings (TED hose) for 2 weeks on both leg(s).  You may remove them at night for sleeping.      Allergies as of 03/06/2021       Reactions   Amiodarone Shortness Of Breath   Atorvastatin Other (See Comments)   Arthralgias   Dilaudid [hydromorphone] Other (See Comments)   Shaking all over (chills/Malaise)   Lisinopril Other (See Comments)   CP and SOB   Metoprolol Other (See Comments)   CP and SOb   Rosuvastatin Other (See Comments)   Myalgias   Diltiazem Rash   Rocephin [ceftriaxone Sodium In Dextrose] Rash        Medication List     STOP taking these medications    HYDROcodone-acetaminophen 10-325 MG tablet Commonly known as: NORCO       TAKE these medications    acetaminophen 500 MG tablet Commonly known as: TYLENOL Take 1,000 mg by mouth every 6 (six) hours as needed for mild pain or fever.   allopurinol 300 MG tablet Commonly known as: ZYLOPRIM Take 300 mg by mouth every morning.   cyclobenzaprine 5 MG tablet Commonly known as: FLEXERIL Take 1 tablet (5 mg total) by mouth 3 (three) times daily as needed for muscle spasms.   docusate sodium 100 MG capsule Commonly known as: COLACE Take 100-200 mg by mouth 2 (two) times daily as needed for constipation.   Eliquis 5 MG Tabs tablet Generic drug: apixaban TAKE 1 TABLET BY MOUTH TWICE A DAY   furosemide 40 MG tablet Commonly known as: LASIX TAKE ONE TABLET BY MOUTH DAILY,MAY TAKE EXTRA TAB DAILY AS NEEDED FOR WEIGHT GAIN/SWELLING What changed: See the new instructions.   gabapentin 300 MG capsule Commonly known  as: NEURONTIN Take 900 mg by mouth at bedtime.   oxyCODONE 5 MG immediate release tablet Commonly known as: Oxy IR/ROXICODONE Take 1-2 tablets (5-10 mg total) by mouth every 4 (four) hours as needed for severe pain.   polyethylene glycol 17 g packet Commonly known as: MIRALAX / GLYCOLAX Take 17 g by mouth daily as needed for mild constipation.   Synthroid 112 MCG tablet Generic drug: levothyroxine Take 112 mcg by mouth daily at 2 am.               Discharge Care Instructions  (From admission, onward)           Start     Ordered   03/06/21 0000  Change dressing       Comments: Maintain surgical dressing until follow up in the clinic. If the edges start to pull up, may reinforce with tape. If the dressing is no longer working, may remove and cover with gauze and tape, but must keep the area dry and clean.  Call with any questions or concerns.   03/06/21 [id number]            Follow-up Information     Irving Copas, PA-C. Go on 03/20/2021.  Specialty: Orthopedic Surgery Why: You are scheduled for a follow up appointment on 03-20-21 at 9:30 am. Contact information: [address] [place] [address] [phone]                 Signed: Griffith Citron, PA-C Orthopedic Surgery 03/15/2021, 8:06 AM

## 2021-03-18 DIAGNOSIS — M25462 Effusion, left knee: Secondary | ICD-10-CM | POA: Diagnosis not present

## 2021-03-18 DIAGNOSIS — Z96652 Presence of left artificial knee joint: Secondary | ICD-10-CM | POA: Diagnosis not present

## 2021-03-18 DIAGNOSIS — R2689 Other abnormalities of gait and mobility: Secondary | ICD-10-CM | POA: Diagnosis not present

## 2021-03-18 DIAGNOSIS — R531 Weakness: Secondary | ICD-10-CM | POA: Diagnosis not present

## 2021-03-18 DIAGNOSIS — M25562 Pain in left knee: Secondary | ICD-10-CM | POA: Diagnosis not present

## 2021-03-25 DIAGNOSIS — M25562 Pain in left knee: Secondary | ICD-10-CM | POA: Diagnosis not present

## 2021-03-25 DIAGNOSIS — R531 Weakness: Secondary | ICD-10-CM | POA: Diagnosis not present

## 2021-03-25 DIAGNOSIS — M25462 Effusion, left knee: Secondary | ICD-10-CM | POA: Diagnosis not present

## 2021-03-25 DIAGNOSIS — R2689 Other abnormalities of gait and mobility: Secondary | ICD-10-CM | POA: Diagnosis not present

## 2021-03-25 DIAGNOSIS — Z96652 Presence of left artificial knee joint: Secondary | ICD-10-CM | POA: Diagnosis not present

## 2021-03-27 DIAGNOSIS — I10 Essential (primary) hypertension: Secondary | ICD-10-CM | POA: Diagnosis not present

## 2021-03-27 DIAGNOSIS — J309 Allergic rhinitis, unspecified: Secondary | ICD-10-CM | POA: Diagnosis not present

## 2021-04-16 DIAGNOSIS — M48061 Spinal stenosis, lumbar region without neurogenic claudication: Secondary | ICD-10-CM | POA: Diagnosis not present

## 2021-04-16 DIAGNOSIS — R69 Illness, unspecified: Secondary | ICD-10-CM | POA: Diagnosis not present

## 2021-04-16 DIAGNOSIS — M5136 Other intervertebral disc degeneration, lumbar region: Secondary | ICD-10-CM | POA: Diagnosis not present

## 2021-04-24 DIAGNOSIS — Z471 Aftercare following joint replacement surgery: Secondary | ICD-10-CM | POA: Diagnosis not present

## 2021-04-24 DIAGNOSIS — Z4789 Encounter for other orthopedic aftercare: Secondary | ICD-10-CM | POA: Diagnosis not present

## 2021-04-26 DIAGNOSIS — I10 Essential (primary) hypertension: Secondary | ICD-10-CM | POA: Diagnosis not present

## 2021-04-26 DIAGNOSIS — E039 Hypothyroidism, unspecified: Secondary | ICD-10-CM | POA: Diagnosis not present

## 2021-04-26 DIAGNOSIS — R0600 Dyspnea, unspecified: Secondary | ICD-10-CM | POA: Diagnosis not present

## 2021-04-26 DIAGNOSIS — E782 Mixed hyperlipidemia: Secondary | ICD-10-CM | POA: Diagnosis not present

## 2021-04-26 DIAGNOSIS — I482 Chronic atrial fibrillation, unspecified: Secondary | ICD-10-CM | POA: Diagnosis not present

## 2021-04-26 DIAGNOSIS — R972 Elevated prostate specific antigen [PSA]: Secondary | ICD-10-CM | POA: Diagnosis not present

## 2021-04-26 DIAGNOSIS — Z131 Encounter for screening for diabetes mellitus: Secondary | ICD-10-CM | POA: Diagnosis not present

## 2021-04-26 DIAGNOSIS — Z96652 Presence of left artificial knee joint: Secondary | ICD-10-CM | POA: Diagnosis not present

## 2021-05-01 DIAGNOSIS — Z8679 Personal history of other diseases of the circulatory system: Secondary | ICD-10-CM | POA: Diagnosis not present

## 2021-05-01 DIAGNOSIS — Z9889 Other specified postprocedural states: Secondary | ICD-10-CM | POA: Diagnosis not present

## 2021-05-01 DIAGNOSIS — I4729 Other ventricular tachycardia: Secondary | ICD-10-CM | POA: Diagnosis not present

## 2021-05-01 DIAGNOSIS — I471 Supraventricular tachycardia: Secondary | ICD-10-CM | POA: Diagnosis not present

## 2021-05-01 DIAGNOSIS — I480 Paroxysmal atrial fibrillation: Secondary | ICD-10-CM | POA: Diagnosis not present

## 2021-05-01 DIAGNOSIS — Z7901 Long term (current) use of anticoagulants: Secondary | ICD-10-CM | POA: Diagnosis not present

## 2021-05-07 DIAGNOSIS — I480 Paroxysmal atrial fibrillation: Secondary | ICD-10-CM | POA: Diagnosis not present

## 2021-05-07 DIAGNOSIS — I471 Supraventricular tachycardia: Secondary | ICD-10-CM | POA: Diagnosis not present

## 2021-05-07 DIAGNOSIS — Z7901 Long term (current) use of anticoagulants: Secondary | ICD-10-CM | POA: Diagnosis not present

## 2021-05-07 DIAGNOSIS — Z01810 Encounter for preprocedural cardiovascular examination: Secondary | ICD-10-CM | POA: Diagnosis not present

## 2021-05-16 DIAGNOSIS — H40023 Open angle with borderline findings, high risk, bilateral: Secondary | ICD-10-CM | POA: Diagnosis not present

## 2021-05-16 DIAGNOSIS — G453 Amaurosis fugax: Secondary | ICD-10-CM | POA: Diagnosis not present

## 2021-05-16 DIAGNOSIS — Z961 Presence of intraocular lens: Secondary | ICD-10-CM | POA: Diagnosis not present

## 2021-05-16 DIAGNOSIS — H53122 Transient visual loss, left eye: Secondary | ICD-10-CM | POA: Diagnosis not present

## 2021-05-17 ENCOUNTER — Inpatient Hospital Stay (HOSPITAL_COMMUNITY)
Admission: EM | Admit: 2021-05-17 | Discharge: 2021-05-23 | DRG: 038 | Disposition: A | Discharge status: Home / Self Care | Payer: Medicare HMO | Attending: Internal Medicine | Admitting: Internal Medicine

## 2021-05-17 ENCOUNTER — Encounter (HOSPITAL_COMMUNITY): Payer: Self-pay | Admitting: *Deleted

## 2021-05-17 ENCOUNTER — Emergency Department (HOSPITAL_COMMUNITY): Payer: Medicare HMO

## 2021-05-17 DIAGNOSIS — I6522 Occlusion and stenosis of left carotid artery: Secondary | ICD-10-CM | POA: Diagnosis not present

## 2021-05-17 DIAGNOSIS — Z79899 Other long term (current) drug therapy: Secondary | ICD-10-CM

## 2021-05-17 DIAGNOSIS — Z888 Allergy status to other drugs, medicaments and biological substances status: Secondary | ICD-10-CM

## 2021-05-17 DIAGNOSIS — Z7901 Long term (current) use of anticoagulants: Secondary | ICD-10-CM

## 2021-05-17 DIAGNOSIS — E669 Obesity, unspecified: Secondary | ICD-10-CM | POA: Diagnosis not present

## 2021-05-17 DIAGNOSIS — G459 Transient cerebral ischemic attack, unspecified: Secondary | ICD-10-CM | POA: Diagnosis present

## 2021-05-17 DIAGNOSIS — H538 Other visual disturbances: Secondary | ICD-10-CM | POA: Diagnosis not present

## 2021-05-17 DIAGNOSIS — G453 Amaurosis fugax: Secondary | ICD-10-CM | POA: Diagnosis not present

## 2021-05-17 DIAGNOSIS — I10 Essential (primary) hypertension: Secondary | ICD-10-CM | POA: Diagnosis present

## 2021-05-17 DIAGNOSIS — G8929 Other chronic pain: Secondary | ICD-10-CM | POA: Diagnosis not present

## 2021-05-17 DIAGNOSIS — I6501 Occlusion and stenosis of right vertebral artery: Secondary | ICD-10-CM | POA: Diagnosis not present

## 2021-05-17 DIAGNOSIS — Z881 Allergy status to other antibiotic agents status: Secondary | ICD-10-CM

## 2021-05-17 DIAGNOSIS — I9581 Postprocedural hypotension: Secondary | ICD-10-CM | POA: Diagnosis not present

## 2021-05-17 DIAGNOSIS — Z96653 Presence of artificial knee joint, bilateral: Secondary | ICD-10-CM | POA: Diagnosis present

## 2021-05-17 DIAGNOSIS — I5032 Chronic diastolic (congestive) heart failure: Secondary | ICD-10-CM | POA: Diagnosis present

## 2021-05-17 DIAGNOSIS — Z20822 Contact with and (suspected) exposure to covid-19: Secondary | ICD-10-CM | POA: Diagnosis not present

## 2021-05-17 DIAGNOSIS — Z955 Presence of coronary angioplasty implant and graft: Secondary | ICD-10-CM | POA: Diagnosis not present

## 2021-05-17 DIAGNOSIS — M109 Gout, unspecified: Secondary | ICD-10-CM | POA: Diagnosis not present

## 2021-05-17 DIAGNOSIS — I4819 Other persistent atrial fibrillation: Secondary | ICD-10-CM | POA: Diagnosis not present

## 2021-05-17 DIAGNOSIS — E785 Hyperlipidemia, unspecified: Secondary | ICD-10-CM | POA: Diagnosis present

## 2021-05-17 DIAGNOSIS — K219 Gastro-esophageal reflux disease without esophagitis: Secondary | ICD-10-CM | POA: Diagnosis present

## 2021-05-17 DIAGNOSIS — I110 Hypertensive heart disease with heart failure: Secondary | ICD-10-CM | POA: Diagnosis present

## 2021-05-17 DIAGNOSIS — I4891 Unspecified atrial fibrillation: Secondary | ICD-10-CM | POA: Diagnosis not present

## 2021-05-17 DIAGNOSIS — N400 Enlarged prostate without lower urinary tract symptoms: Secondary | ICD-10-CM | POA: Diagnosis not present

## 2021-05-17 DIAGNOSIS — E039 Hypothyroidism, unspecified: Secondary | ICD-10-CM | POA: Diagnosis not present

## 2021-05-17 DIAGNOSIS — Z6833 Body mass index (BMI) 33.0-33.9, adult: Secondary | ICD-10-CM

## 2021-05-17 DIAGNOSIS — Z885 Allergy status to narcotic agent status: Secondary | ICD-10-CM | POA: Diagnosis not present

## 2021-05-17 DIAGNOSIS — R579 Shock, unspecified: Secondary | ICD-10-CM | POA: Diagnosis not present

## 2021-05-17 DIAGNOSIS — Z87891 Personal history of nicotine dependence: Secondary | ICD-10-CM

## 2021-05-17 DIAGNOSIS — G319 Degenerative disease of nervous system, unspecified: Secondary | ICD-10-CM | POA: Diagnosis not present

## 2021-05-17 DIAGNOSIS — J84112 Idiopathic pulmonary fibrosis: Secondary | ICD-10-CM | POA: Diagnosis present

## 2021-05-17 DIAGNOSIS — Z79890 Hormone replacement therapy (postmenopausal): Secondary | ICD-10-CM

## 2021-05-17 DIAGNOSIS — Z9889 Other specified postprocedural states: Secondary | ICD-10-CM

## 2021-05-17 DIAGNOSIS — Z95828 Presence of other vascular implants and grafts: Secondary | ICD-10-CM

## 2021-05-17 DIAGNOSIS — I2510 Atherosclerotic heart disease of native coronary artery without angina pectoris: Secondary | ICD-10-CM | POA: Diagnosis present

## 2021-05-17 LAB — DIFFERENTIAL
Abs Immature Granulocytes: 0.01 10*3/uL (ref 0.00–0.07)
Basophils Absolute: 0 10*3/uL (ref 0.0–0.1)
Basophils Relative: 1 %
Eosinophils Absolute: 0 10*3/uL (ref 0.0–0.5)
Eosinophils Relative: 0 %
Immature Granulocytes: 0 %
Lymphocytes Relative: 30 %
Lymphs Abs: 1.6 10*3/uL (ref 0.7–4.0)
Monocytes Absolute: 0.5 10*3/uL (ref 0.1–1.0)
Monocytes Relative: 10 %
Neutro Abs: 3.2 10*3/uL (ref 1.7–7.7)
Neutrophils Relative %: 59 %

## 2021-05-17 LAB — PROTIME-INR
INR: 1.1 (ref 0.8–1.2)
Prothrombin Time: 13.8 seconds (ref 11.4–15.2)

## 2021-05-17 LAB — CBC
HCT: 49.4 % (ref 39.0–52.0)
Hemoglobin: 16.7 g/dL (ref 13.0–17.0)
MCH: 35 pg — ABNORMAL HIGH (ref 26.0–34.0)
MCHC: 33.8 g/dL (ref 30.0–36.0)
MCV: 103.6 fL — ABNORMAL HIGH (ref 80.0–100.0)
Platelets: 145 10*3/uL — ABNORMAL LOW (ref 150–400)
RBC: 4.77 MIL/uL (ref 4.22–5.81)
RDW: 13.4 % (ref 11.5–15.5)
WBC: 5.3 10*3/uL (ref 4.0–10.5)
nRBC: 0 % (ref 0.0–0.2)

## 2021-05-17 LAB — COMPREHENSIVE METABOLIC PANEL
ALT: 20 U/L (ref 0–44)
AST: 22 U/L (ref 15–41)
Albumin: 4.4 g/dL (ref 3.5–5.0)
Alkaline Phosphatase: 53 U/L (ref 38–126)
Anion gap: 11 (ref 5–15)
BUN: 21 mg/dL (ref 8–23)
CO2: 26 mmol/L (ref 22–32)
Calcium: 9.2 mg/dL (ref 8.9–10.3)
Chloride: 105 mmol/L (ref 98–111)
Creatinine, Ser: 1.15 mg/dL (ref 0.61–1.24)
GFR, Estimated: >60 mL/min (ref >60)
Glucose, Bld: 98 mg/dL (ref 70–99)
Potassium: 4 mmol/L (ref 3.5–5.1)
Sodium: 142 mmol/L (ref 135–145)
Total Bilirubin: 0.5 mg/dL (ref 0.3–1.2)
Total Protein: 7.2 g/dL (ref 6.5–8.1)

## 2021-05-17 LAB — APTT: aPTT: 33 seconds (ref 24–36)

## 2021-05-17 LAB — URINALYSIS, ROUTINE W REFLEX MICROSCOPIC
Bilirubin Urine: NEGATIVE
Glucose, UA: NEGATIVE mg/dL
Hgb urine dipstick: NEGATIVE
Ketones, ur: NEGATIVE mg/dL
Leukocytes,Ua: NEGATIVE
Nitrite: NEGATIVE
Protein, ur: NEGATIVE mg/dL
Specific Gravity, Urine: >1.046 — ABNORMAL HIGH (ref 1.005–1.030)
pH: 6 (ref 5.0–8.0)

## 2021-05-17 LAB — RAPID URINE DRUG SCREEN, HOSP PERFORMED
Amphetamines: NOT DETECTED
Barbiturates: NOT DETECTED
Benzodiazepines: NOT DETECTED
Cocaine: NOT DETECTED
Opiates: POSITIVE — AB
Tetrahydrocannabinol: NOT DETECTED

## 2021-05-17 LAB — ETHANOL: Alcohol, Ethyl (B): <10 mg/dL (ref <10)

## 2021-05-17 LAB — RESP PANEL BY RT-PCR (FLU A&B, COVID) ARPGX2
Influenza A by PCR: NEGATIVE
Influenza B by PCR: NEGATIVE
SARS Coronavirus 2 by RT PCR: NEGATIVE

## 2021-05-17 MED ORDER — LEVOTHYROXINE SODIUM 112 MCG PO TABS
112.0000 ug | ORAL_TABLET | Freq: Every day | ORAL | Status: DC
  Administered 2021-05-19 – 2021-05-23 (×4): 112 ug via ORAL
  Filled 2021-05-17 (×5): qty 1

## 2021-05-17 MED ORDER — STROKE: EARLY STAGES OF RECOVERY BOOK
Freq: Once | Status: AC
  Filled 2021-05-17: qty 1

## 2021-05-17 MED ORDER — FUROSEMIDE 40 MG PO TABS
40.0000 mg | ORAL_TABLET | Freq: Every day | ORAL | Status: DC
  Administered 2021-05-18 – 2021-05-23 (×5): 40 mg via ORAL
  Filled 2021-05-17 (×5): qty 1

## 2021-05-17 MED ORDER — SENNOSIDES-DOCUSATE SODIUM 8.6-50 MG PO TABS: 1.0000 | ORAL_TABLET | Freq: Every evening | ORAL | Status: DC | PRN

## 2021-05-17 MED ORDER — ACETAMINOPHEN 325 MG PO TABS
650.0000 mg | ORAL_TABLET | ORAL | Status: DC | PRN
  Administered 2021-05-19: 02:00:00 650 mg via ORAL
  Filled 2021-05-17: qty 2

## 2021-05-17 MED ORDER — APIXABAN 5 MG PO TABS
5.0000 mg | ORAL_TABLET | Freq: Two times a day (BID) | ORAL | Status: DC
  Administered 2021-05-18 (×2): 5 mg via ORAL
  Filled 2021-05-17 (×2): qty 1

## 2021-05-17 MED ORDER — ACETAMINOPHEN 160 MG/5ML PO SOLN: 650.0000 mg | ORAL | Status: DC | PRN

## 2021-05-17 MED ORDER — METOPROLOL TARTRATE 25 MG PO TABS
25.0000 mg | ORAL_TABLET | Freq: Two times a day (BID) | ORAL | Status: DC
  Administered 2021-05-18 – 2021-05-23 (×10): 25 mg via ORAL
  Filled 2021-05-17 (×10): qty 1

## 2021-05-17 MED ORDER — HYDROCODONE-ACETAMINOPHEN 10-325 MG PO TABS
1.0000 | ORAL_TABLET | Freq: Two times a day (BID) | ORAL | Status: DC
Start: 2021-05-18 — End: 2021-05-18
  Administered 2021-05-18 (×2): 1 via ORAL
  Filled 2021-05-17 (×2): qty 1

## 2021-05-17 MED ORDER — ACETAMINOPHEN 650 MG RE SUPP: 650.0000 mg | RECTAL | Status: DC | PRN

## 2021-05-17 MED ORDER — IOHEXOL 350 MG/ML SOLN
75.0000 mL | Freq: Once | INTRAVENOUS | Status: AC | PRN
  Administered 2021-05-17: 75 mL via INTRAVENOUS

## 2021-05-17 MED ORDER — GABAPENTIN 300 MG PO CAPS
900.0000 mg | ORAL_CAPSULE | Freq: Every day | ORAL | Status: DC
  Administered 2021-05-18 – 2021-05-22 (×6): 900 mg via ORAL
  Filled 2021-05-17 (×6): qty 3

## 2021-05-17 NOTE — ED Notes (Signed)
Patient transported to CT 

## 2021-05-17 NOTE — H&P (Signed)
TRH H&P    Patient Demographics:    Wayne Ortiz, is a 76 y.o. male  MRN: [id number]  DOB - 03-Feb-1946  Admit Date - 05/17/2021  Referring MD/NP/PA: Sabra Heck  Outpatient Primary MD for the patient is Celene Squibb, MD  Patient coming from: Home  Chief complaint- partial vision loss   HPI:    Wayne Ortiz  is a 76 y.o. male, with history of atrial fibrillation, BPH, coronary artery disease, GERD, history of gout, hypothyroidism, and more presents ED with a chief complaint of partial vision loss.  Patient is very irritable about being admitted, and is only partially cooperating with history.  He reports that 3 times in the last 3 months he has had a blind spot in his left eye.  He reports that he loses the temporal half of his visual field from his left eye.  The first time was 3 months ago the second and third time where in the past couple of days, his episodes are becoming more frequent.  He has no associated symptoms with the vision loss,Including no headache, tinnitus, dizziness, palpitations, pain or anything else.  Patient does report that he has palpitations from time to time but not associated with the vision loss.  Patient reports he is otherwise been in his normal state of health.  His baseline health does include chronic pain in his back, legs, feet.  He takes hydrocodone 10 mg twice daily.  He takes 900 mg of gabapentin at night as well.  Patient was first refusing admission, but ultimately agreeing to be transported to [facility].  He is being sent to [facility] because we do not have neurology or MRI here on the weekend.  The neurologist that was on-call tonight said she would be happy to consult via telephone in the a.m., but we still do not have MRI which is an important part of her work-up.  Patient does not smoke, does not drink alcohol, does not use illicit drugs.  He is vaccinated for COVID.  Patient is full code.  In  the ED 88, heart rate 89-95, respiratory rate 18, blood pressure 136/88, satting at 99% No leukocytosis with a white blood cell count of 5.3, hemoglobin 16.7 Chemistry panel is unremarkable with a slightly elevated creatinine that is at patient's baseline Respiratory panel pending CT angio head and neck shows no intracranial large vessel occlusion.  Mild stenosis in the right cavernous and supraclinoid ICA without additional hemodynamically significant intracranial stenosis.  Approximately 70% stenosis in the distal left common carotid artery at the bifurcation without significant stenosis in the proximal left ICA no other hemodynamically significant stenosis in the neck CT head shows no acute intracranial abnormality.  Atrophy and chronic small vessel ischemic disease. EG shows atrial fibrillation, heart rate 88, QTc 459 Alcohol level less than 10 ED provider spoke with neurology who recommended MRI of the brain with and without contrast, MRI of the orbit with and without contrast, as echo, continue Eliquis, permissive hypertension, and inpatient consult to neurology      Review of  systems:    In addition to the HPI above,  No Fever-chills, No Headache, No changes with  hearing, No problems swallowing food or Liquids, No Chest pain, Cough or Shortness of Breath, No Abdominal pain, No Nausea or Vomiting, bowel movements are regular, No Blood in stool or Urine, No dysuria, No new skin rashes or bruises, No new joints pains-aches,  No new weakness, tingling, numbness in any extremity, No recent weight gain or loss, No polyuria, polydypsia or polyphagia, No significant Mental Stressors.  All other systems reviewed and are negative.    Past History of the following :    Past Medical History:  Diagnosis Date   Arthritis    Atrial fibrillation (HCC)    BPH (benign prostatic hypertrophy)    CAD (coronary artery disease)    a. s/p DES 05/2014 at [facility])    Complication  of anesthesia    BECAME COMBATIVE   Dyspnea    Dysrhythmia    Femur fracture (HCC)    GERD (gastroesophageal reflux disease)    occasional TUMS   Gout    History of gout    History of kidney stones    Hypertension    Hypothyroidism    Persistent atrial fibrillation (HCC)    Precancerous skin lesion    Renal disorder       Past Surgical History:  Procedure Laterality Date   arm fracture surgery Left    CARDIOVERSION [name] 12/09/2014   Procedure: CARDIOVERSION;  Surgeon: [name], MD;  Location: [place];  Service: Cardiovascular;  Laterality: [name];   CARDIOVERSION [name] 05/05/2016   Procedure: CARDIOVERSION;  Surgeon: [name], MD;  Location: [place];  Service: Cardiovascular;  Laterality: [name];   CARDIOVERSION [name] 08/22/2016   Procedure: CARDIOVERSION;  Surgeon: Lelon Perla, MD;  Location: [facility] ENDOSCOPY;  Service: Cardiovascular;  Laterality: [name];   CATARACTS     EXCISION   CERVICAL SPINE SURGERY     CHOLECYSTECTOMY [name] 01/03/2013   Procedure: LAPAROSCOPIC CHOLECYSTECTOMY;  Surgeon: [name], MD;  Location: [name];  Service: General;  Laterality: [name];   ELECTROPHYSIOLOGIC STUDY [name] 03/15/2015   Procedure: Atrial Fibrillation Ablation;  Surgeon: [name], MD;  Location: [place] CV LAB;  Service: Cardiovascular;  Laterality: [name];   ELECTROPHYSIOLOGIC STUDY [name] 04/01/2016   Procedure: Atrial Fibrillation Ablation;  Surgeon: [name], MD;  Location: [place] CV LAB;  Service: Cardiovascular;  Laterality: [name];   ERCP [name] 01/02/2013   Procedure: ENDOSCOPIC RETROGRADE CHOLANGIOPANCREATOGRAPHY (ERCP);  Surgeon: [name], MD;  Location: [name];  Service: Endoscopy;  Laterality: [name];   FEMUR FRACTURE SURGERY     LIVER BIOPSY [name] 01/03/2013   Procedure: LIVER BIOPSY;  Surgeon: [name], MD;  Location: [name];  Service: General;  Laterality: [name];   RIGHT/LEFT HEART CATH AND CORONARY ANGIOGRAPHY [name] 07/04/2016   Procedure: Right/Left Heart Cath and  Coronary Angiography;  Surgeon: Belva Crome, MD;  Location: [place] CV LAB;  Service: Cardiovascular;  Laterality: [name];   SPHINCTEROTOMY [name] 01/02/2013   Procedure: SPHINCTEROTOMY;  Surgeon: [name], MD;  Location: [name];  Service: Endoscopy;  Laterality: [name];  Stone Extraction   TEE WITHOUT CARDIOVERSION [name] 08/22/2016   Procedure: TRANSESOPHAGEAL ECHOCARDIOGRAM (TEE);  Surgeon: Lelon Perla, MD;  Location: [place];  Service: Cardiovascular;  Laterality: [name];   TOTAL KNEE ARTHROPLASTY Right 05/30/2013   Procedure: RIGHT TOTAL KNEE ARTHROPLASTY;  Surgeon: [name], MD;  Location: [name];  Service: Orthopedics;  Laterality: Right;   TOTAL KNEE ARTHROPLASTY Left 03/05/2021   Procedure: TOTAL KNEE ARTHROPLASTY;  Surgeon: Paralee Cancel, MD;  Location: [name];  Service: Orthopedics;  Laterality: Left;      Social History:      Social History   Tobacco Use   Smoking status: Former    Packs/day: 1.00    Years: 20.00    Pack years: 20.00    Types: Cigarettes    Quit date: 05/23/1973    Years since quitting: 48.0   Smokeless tobacco: Never  Substance Use Topics   Alcohol use: Not Currently       Family History :     Family History  Problem Relation Age of Onset   Kidney Stones Mother       Home Medications:   Prior to Admission medications   Medication Sig Start Date End Date Taking? Authorizing Provider  HYDROcodone-acetaminophen (NORCO) 10-325 MG tablet Take 1 tablet by mouth See admin instructions. Take 1 tablet by mouth every 6 to 8 hours as needed for pain. 04/16/21  Yes [provider]  metoprolol tartrate (LOPRESSOR) 25 MG tablet Take by mouth. 04/26/21  Yes [provider]  acetaminophen (TYLENOL) 500 MG tablet Take 1,000 mg by mouth every 6 (six) hours as needed for mild pain or fever.    [provider]  allopurinol (ZYLOPRIM) 300 MG tablet Take 300 mg by mouth every morning.     [provider]   cyclobenzaprine (FLEXERIL) 5 MG tablet Take 1 tablet (5 mg total) by mouth 3 (three) times daily as needed for muscle spasms. 03/06/21   Irving Copas, PA-C  docusate sodium (COLACE) 100 MG capsule Take 100-200 mg by mouth 2 (two) times daily as needed for constipation.    [provider]  ELIQUIS 5 MG TABS tablet TAKE 1 TABLET BY MOUTH TWICE A DAY 02/03/18   Belva Crome, MD  furosemide (LASIX) 40 MG tablet TAKE ONE TABLET BY MOUTH DAILY,MAY TAKE EXTRA TAB DAILY AS NEEDED FOR WEIGHT GAIN/SWELLING Patient taking differently: Take 40 mg by mouth daily. 11/17/16   Sherran Needs, NP  gabapentin (NEURONTIN) 300 MG capsule Take 900 mg by mouth at bedtime. 12/13/20   [provider]  oxyCODONE (OXY IR/ROXICODONE) 5 MG immediate release tablet Take 1-2 tablets (5-10 mg total) by mouth every 4 (four) hours as needed for severe pain. 03/06/21   Irving Copas, PA-C  polyethylene glycol (MIRALAX / GLYCOLAX) 17 g packet Take 17 g by mouth daily as needed for mild constipation. 03/06/21   Irving Copas, PA-C  SYNTHROID 112 MCG tablet Take 112 mcg by mouth daily at 2 am. 12/03/20   [provider]     Allergies:     Allergies  Allergen Reactions   Amiodarone Shortness Of Breath   Atorvastatin Other (See Comments)    Arthralgias   Dilaudid [Hydromorphone] Other (See Comments)    Shaking all over (chills/Malaise)   Lisinopril Other (See Comments)    CP and SOB   Metoprolol Other (See Comments)    CP and SOb   Rosuvastatin Other (See Comments)    Myalgias   Diltiazem Rash   Rocephin [Ceftriaxone Sodium In Dextrose] Rash     Physical Exam:   Vitals  Blood pressure 123/80, pulse 83, temperature 98 F (36.7 C), temperature source Oral, resp. rate 18, SpO2 96 %.   1.  General: Patient lying supine in bed  with head of bed elevated,  no acute distress   2. Psychiatric: Alert and oriented x 3, mood is irritable behaviors abrasive, partially cooperative with  exam  3. Neurologic: Speech and language are normal, face is symmetric, moves all 4 extremities voluntarily, visual fields intact at this time   4. HEENMT:  Head is atraumatic, normocephalic, pupils reactive to light, neck is supple, trachea is midline, mucous membranes are moist   5. Respiratory : Lungs are clear to auscultation bilaterally without wheezing, rhonchi, rales, no cyanosis, no increase in work of breathing or accessory muscle use   6. Cardiovascular : Heart rate normal, rhythm is irregularly irregular no murmurs, rubs or gallops, no peripheral edema, peripheral pulses palpated   7. Gastrointestinal:  Abdomen is soft, nondistended, nontender to palpation bowel sounds active, no masses or organomegaly palpated   8. Skin:  Skin is warm, dry and intact without rashes, acute lesions, or ulcers on limited exam   9.Musculoskeletal:  No acute deformities or trauma, no asymmetry in tone, no peripheral edema, peripheral pulses palpated, no tenderness to palpation in the extremities     Data Review:    CBC Recent Labs  Lab 05/17/21 1711  WBC 5.3  HGB 16.7  HCT 49.4  PLT 145*  MCV 103.6*  MCH 35.0*  MCHC 33.8  RDW 13.4  LYMPHSABS 1.6  MONOABS 0.5  EOSABS 0.0  BASOSABS 0.0   ------------------------------------------------------------------------------------------------------------------  Results for orders placed or performed during the hospital encounter of 05/17/21 (from the past 48 hour(s))  Ethanol     Status: None   Collection Time: 05/17/21  5:11 PM  Result Value Ref Range   Alcohol, Ethyl (B) <10 <10 mg/dL    Comment: (NOTE) Lowest detectable limit for serum alcohol is 10 mg/dL.  For medical purposes only. Performed at [facility], [address] [place], [place] [address]   Protime-INR     Status: None   Collection Time: 05/17/21  5:11 PM  Result Value Ref Range   Prothrombin Time 13.8 11.4 - 15.2 seconds   INR 1.1 0.8 - 1.2    Comment:  (NOTE) INR goal varies based on device and disease states. Performed at [facility], [address] [place], [place] [address]   APTT     Status: None   Collection Time: 05/17/21  5:11 PM  Result Value Ref Range   aPTT 33 24 - 36 seconds    Comment: Performed at [facility], [address], [place], [place] [address]  CBC     Status: Abnormal   Collection Time: 05/17/21  5:11 PM  Result Value Ref Range   WBC 5.3 4.0 - 10.5 K/uL   RBC 4.77 4.22 - 5.81 MIL/uL   Hemoglobin 16.7 13.0 - 17.0 g/dL   HCT 49.4 39.0 - 52.0 %   MCV 103.6 (H) 80.0 - 100.0 fL   MCH 35.0 (H) 26.0 - 34.0 pg   MCHC 33.8 30.0 - 36.0 g/dL   RDW 13.4 11.5 - 15.5 %   Platelets 145 (L) 150 - 400 K/uL   nRBC 0.0 0.0 - 0.2 %    Comment: Performed at [facility], [address] [place], [place] [address]  Differential     Status: None   Collection Time: 05/17/21  5:11 PM  Result Value Ref Range   Neutrophils Relative % 59 %   Neutro Abs 3.2 1.7 - 7.7 K/uL   Lymphocytes Relative 30 %   Lymphs Abs 1.6 0.7 - 4.0  K/uL   Monocytes Relative 10 %   Monocytes Absolute 0.5 0.1 - 1.0 K/uL   Eosinophils Relative 0 %   Eosinophils Absolute 0.0 0.0 - 0.5 K/uL   Basophils Relative 1 %   Basophils Absolute 0.0 0.0 - 0.1 K/uL   Immature Granulocytes 0 %   Abs Immature Granulocytes 0.01 0.00 - 0.07 K/uL    Comment: Performed at [facility], [address] [place], [place] [address]  Comprehensive metabolic panel     Status: None   Collection Time: 05/17/21  5:11 PM  Result Value Ref Range   Sodium 142 135 - 145 mmol/L   Potassium 4.0 3.5 - 5.1 mmol/L   Chloride 105 98 - 111 mmol/L   CO2 26 22 - 32 mmol/L   Glucose, Bld 98 70 - 99 mg/dL    Comment: Glucose reference range applies only to samples taken after fasting for at least 8 hours.   BUN 21 8 - 23 mg/dL   Creatinine, Ser 1.15 0.61 - 1.24 mg/dL   Calcium 9.2 8.9 - 10.3 mg/dL   Total Protein 7.2 6.5 - 8.1 g/dL   Albumin 4.4 3.5 - 5.0 g/dL   AST 22 15 - 41 U/L    ALT 20 0 - 44 U/L   Alkaline Phosphatase 53 38 - 126 U/L   Total Bilirubin 0.5 0.3 - 1.2 mg/dL   GFR, Estimated >60 >60 mL/min    Comment: (NOTE) Calculated using the CKD-EPI Creatinine Equation (2021)    Anion gap 11 5 - 15    Comment: Performed at [facility], [address], [place], [place] [address]  Resp Panel by RT-PCR (Flu A&B, Covid) Nasopharyngeal Swab     Status: None   Collection Time: 05/17/21  6:29 PM   Specimen: Nasopharyngeal Swab; Nasopharyngeal(NP) swabs in vial transport medium  Result Value Ref Range   SARS Coronavirus 2 by RT PCR NEGATIVE NEGATIVE    Comment: (NOTE) SARS-CoV-2 target nucleic acids are NOT DETECTED.  The SARS-CoV-2 RNA is generally detectable in upper respiratory specimens during the acute phase of infection. The lowest concentration of SARS-CoV-2 viral copies this assay can detect is 138 copies/mL. A negative result does not preclude SARS-Cov-2 infection and should not be used as the sole basis for treatment or other patient management decisions. A negative result may occur with  improper specimen collection/handling, submission of specimen other than nasopharyngeal swab, presence of viral mutation(s) within the areas targeted by this assay, and inadequate number of viral copies(<138 copies/mL). A negative result must be combined with clinical observations, patient history, and epidemiological information. The expected result is Negative.  Fact Sheet for Patients:  EntrepreneurPulse.com.au  Fact Sheet for Healthcare Providers:  IncredibleEmployment.be  This test is no t yet approved or cleared by the [place] FDA and  has been authorized for detection and/or diagnosis of SARS-CoV-2 by FDA under an Emergency Use Authorization (EUA). This EUA will remain  in effect (meaning this test can be used) for the duration of the COVID-19 declaration under Section 564(b)(1) of the Act, 21 U.S.C.section  360bbb-3(b)(1), unless the authorization is terminated  or revoked sooner.       Influenza A by PCR NEGATIVE NEGATIVE   Influenza B by PCR NEGATIVE NEGATIVE    Comment: (NOTE) The Xpert Xpress SARS-CoV-2/FLU/RSV plus assay is intended as an aid in the diagnosis of influenza from Nasopharyngeal swab specimens and should not be used as a sole basis for treatment. Nasal washings and aspirates are  unacceptable for Xpert Xpress SARS-CoV-2/FLU/RSV testing.  Fact Sheet for Patients: EntrepreneurPulse.com.au  Fact Sheet for Healthcare Providers: IncredibleEmployment.be  This test is not yet approved or cleared by the [place] FDA and has been authorized for detection and/or diagnosis of SARS-CoV-2 by FDA under an Emergency Use Authorization (EUA). This EUA will remain in effect (meaning this test can be used) for the duration of the COVID-19 declaration under Section 564(b)(1) of the Act, 21 U.S.C. section 360bbb-3(b)(1), unless the authorization is terminated or revoked.  Performed at [facility], [address], [place], [place] [address]   Urine rapid drug screen (hosp performed)     Status: Abnormal   Collection Time: 05/17/21  9:13 PM  Result Value Ref Range   Opiates POSITIVE (A) NONE DETECTED   Cocaine NONE DETECTED NONE DETECTED   Benzodiazepines NONE DETECTED NONE DETECTED   Amphetamines NONE DETECTED NONE DETECTED   Tetrahydrocannabinol NONE DETECTED NONE DETECTED   Barbiturates NONE DETECTED NONE DETECTED    Comment: (NOTE) DRUG SCREEN FOR MEDICAL PURPOSES ONLY.  IF CONFIRMATION IS NEEDED FOR ANY PURPOSE, NOTIFY LAB WITHIN 5 DAYS.  LOWEST DETECTABLE LIMITS FOR URINE DRUG SCREEN Drug Class                     Cutoff (ng/mL) Amphetamine and metabolites    1000 Barbiturate and metabolites    200 Benzodiazepine                 659 Tricyclics and metabolites     300 Opiates and metabolites        300 Cocaine and metabolites         300 THC                            50 Performed at [facility], [address] [place], [place] [address]   Urinalysis, Routine w reflex microscopic     Status: Abnormal   Collection Time: 05/17/21  9:13 PM  Result Value Ref Range   Color, Urine YELLOW YELLOW   APPearance CLEAR CLEAR   Specific Gravity, Urine >1.046 (H) 1.005 - 1.030   pH 6.0 5.0 - 8.0   Glucose, UA NEGATIVE NEGATIVE mg/dL   Hgb urine dipstick NEGATIVE NEGATIVE   Bilirubin Urine NEGATIVE NEGATIVE   Ketones, ur NEGATIVE NEGATIVE mg/dL   Protein, ur NEGATIVE NEGATIVE mg/dL   Nitrite NEGATIVE NEGATIVE   Leukocytes,Ua NEGATIVE NEGATIVE    Comment: Performed at [facility], [address] [place], [place] [address]    Chemistries  Recent Labs  Lab 05/17/21 1711  NA 142  K 4.0  CL 105  CO2 26  GLUCOSE 98  BUN 21  CREATININE 1.15  CALCIUM 9.2  AST 22  ALT 20  ALKPHOS 53  BILITOT 0.5   ------------------------------------------------------------------------------------------------------------------  ------------------------------------------------------------------------------------------------------------------ GFR: CrCl cannot be calculated (Unknown ideal weight.). Liver Function Tests: Recent Labs  Lab 05/17/21 1711  AST 22  ALT 20  ALKPHOS 53  BILITOT 0.5  PROT 7.2  ALBUMIN 4.4   No results for input(s): LIPASE, AMYLASE in the last 168 hours. No results for input(s): AMMONIA in the last 168 hours. Coagulation Profile: Recent Labs  Lab 05/17/21 1711  INR 1.1   Cardiac Enzymes: No results for input(s): CKTOTAL, CKMB, CKMBINDEX, TROPONINI in the last 168 hours. BNP (last 3 results) No results for input(s): PROBNP in the last 8760 hours. HbA1C: No results for input(s): HGBA1C in the  last 72 hours. CBG: No results for input(s): GLUCAP in the last 168 hours. Lipid Profile: No results for input(s): CHOL, HDL, LDLCALC, TRIG, CHOLHDL, LDLDIRECT in the last 72 hours. Thyroid Function  Tests: No results for input(s): TSH, T4TOTAL, FREET4, T3FREE, THYROIDAB in the last 72 hours. Anemia Panel: No results for input(s): VITAMINB12, FOLATE, FERRITIN, TIBC, IRON, RETICCTPCT in the last 72 hours.  --------------------------------------------------------------------------------------------------------------- Urine analysis:    Component Value Date/Time   COLORURINE YELLOW 05/17/2021 2113   APPEARANCEUR CLEAR 05/17/2021 2113   LABSPEC >1.046 (H) 05/17/2021 2113   PHURINE 6.0 05/17/2021 2113   GLUCOSEU NEGATIVE 05/17/2021 2113   HGBUR NEGATIVE 05/17/2021 2113   BILIRUBINUR NEGATIVE 05/17/2021 2113   KETONESUR NEGATIVE 05/17/2021 2113   PROTEINUR NEGATIVE 05/17/2021 2113   UROBILINOGEN 0.2 05/23/2013 1033   NITRITE NEGATIVE 05/17/2021 2113   LEUKOCYTESUR NEGATIVE 05/17/2021 2113      Imaging Results:    CT Angio Head W or Wo Contrast  Result Date: 05/17/2021 CLINICAL DATA:  Neuro deficit, stroke suspected, visual problems EXAM: CT ANGIOGRAPHY HEAD AND NECK TECHNIQUE: Multidetector CT imaging of the head and neck was performed using the standard protocol during bolus administration of intravenous contrast. Multiplanar CT image reconstructions and MIPs were obtained to evaluate the vascular anatomy. Carotid stenosis measurements (when applicable) are obtained utilizing NASCET criteria, using the distal internal carotid diameter as the denominator. RADIATION DOSE REDUCTION: This exam was performed according to the departmental dose-optimization program which includes automated exposure control, adjustment of the mA and/or kV according to patient size and/or use of iterative reconstruction technique. CONTRAST:  5mL OMNIPAQUE IOHEXOL 350 MG/ML SOLN COMPARISON:  Prior CTA, correlation is made with CT head 05/17/2021 FINDINGS: CT HEAD FINDINGS For noncontrast findings, please see same day CT head. CTA NECK FINDINGS Aortic arch: Two-vessel arch with a common origin of the brachiocephalic  and left common carotid arteries. Imaged portion shows no evidence of aneurysm or dissection. No significant stenosis of the major arch vessel origins. Aortic atherosclerosis Right carotid system: No evidence of dissection, stenosis (50% or greater) or occlusion. Left carotid system: Approximately 70% stenosis in the distal left common carotid and at the bifurcation (series 7, image 223) without hemodynamically significant stenosis in the proximal left ICA Vertebral arteries: Mild calcifications in the right V1 segment, which are not hemodynamically significant. The left vertebral artery is somewhat small but otherwise unremarkable. No evidence of dissection or occlusion. Skeleton: 5 mm anterolisthesis of C3 on C4, which appears degenerative and longstanding. Status post ACDF C5-T1. No acute osseous abnormality. Other neck: Negative. Upper chest: Apical pleural-parenchymal scarring. No pleural effusion. Review of the MIP images confirms the above findings CTA HEAD FINDINGS Anterior circulation: Both internal carotid arteries are patent to the termini, with calcifications in the bilateral cavernous and supraclinoid segments, which cause mild stenosis. A1 segments patent. Normal anterior communicating artery. Anterior cerebral arteries are patent to their distal aspects. No M1 stenosis or occlusion. Normal MCA bifurcations. Distal MCA branches perfused and symmetric. Posterior circulation: The left vertebral artery primarily supplies PICA. The right vertebral artery is patent to the vertebrobasilar junction, with minimal calcifications which are not hemodynamically significant. Posterior inferior cerebral arteries patent bilaterally. Basilar patent to its distal aspect. Superior cerebellar arteries patent bilaterally. Bilateral P1 segments originate from the basilar artery. The right posterior communicating artery is visualized. The left posterior communicating artery is not definitively seen. PCAs perfused to their  distal aspects without stenosis. Venous sinuses: As permitted by contrast timing, patent. Anatomic  variants: None significant Review of the MIP images confirms the above findings IMPRESSION: 1. No intracranial large vessel occlusion. Mild stenosis in the right cavernous and supraclinoid ICA without additional hemodynamically significant intracranial stenosis. 2. Approximately 70% stenosis in the distal left common carotid artery and at the bifurcation, without significant stenosis in the proximal left ICA. No other hemodynamically significant stenosis in the neck. Electronically Signed   By: Merilyn Baba M.D.   On: 05/17/2021 19:38   CT HEAD WO CONTRAST  Result Date: 05/17/2021 CLINICAL DATA:  Visual problems for 2 months, most recently 2 days ago. EXAM: CT HEAD WITHOUT CONTRAST TECHNIQUE: Contiguous axial images were obtained from the base of the skull through the vertex without intravenous contrast. RADIATION DOSE REDUCTION: This exam was performed according to the departmental dose-optimization program which includes automated exposure control, adjustment of the mA and/or kV according to patient size and/or use of iterative reconstruction technique. COMPARISON:  None. FINDINGS: Brain: There is no evidence for acute hemorrhage, hydrocephalus, mass lesion, or abnormal extra-axial fluid collection. No definite CT evidence for acute infarction. Diffuse loss of parenchymal volume is consistent with atrophy. Patchy low attenuation in the deep hemispheric and periventricular white matter is nonspecific, but likely reflects chronic microvascular ischemic demyelination. Vascular: No hyperdense vessel or unexpected calcification. Skull: No evidence for fracture. No worrisome lytic or sclerotic lesion. Sinuses/Orbits: The visualized paranasal sinuses and mastoid air cells are clear. Visualized portions of the globes and intraorbital fat are unremarkable. Other: None. IMPRESSION: 1. No acute intracranial abnormality. 2.  Atrophy with chronic small vessel ischemic disease. Electronically Signed   By: Misty Stanley M.D.   On: 05/17/2021 17:31   CT Angio Neck W and/or Wo Contrast  Result Date: 05/17/2021 CLINICAL DATA:  Neuro deficit, stroke suspected, visual problems EXAM: CT ANGIOGRAPHY HEAD AND NECK TECHNIQUE: Multidetector CT imaging of the head and neck was performed using the standard protocol during bolus administration of intravenous contrast. Multiplanar CT image reconstructions and MIPs were obtained to evaluate the vascular anatomy. Carotid stenosis measurements (when applicable) are obtained utilizing NASCET criteria, using the distal internal carotid diameter as the denominator. RADIATION DOSE REDUCTION: This exam was performed according to the departmental dose-optimization program which includes automated exposure control, adjustment of the mA and/or kV according to patient size and/or use of iterative reconstruction technique. CONTRAST:  36mL OMNIPAQUE IOHEXOL 350 MG/ML SOLN COMPARISON:  Prior CTA, correlation is made with CT head 05/17/2021 FINDINGS: CT HEAD FINDINGS For noncontrast findings, please see same day CT head. CTA NECK FINDINGS Aortic arch: Two-vessel arch with a common origin of the brachiocephalic and left common carotid arteries. Imaged portion shows no evidence of aneurysm or dissection. No significant stenosis of the major arch vessel origins. Aortic atherosclerosis Right carotid system: No evidence of dissection, stenosis (50% or greater) or occlusion. Left carotid system: Approximately 70% stenosis in the distal left common carotid and at the bifurcation (series 7, image 223) without hemodynamically significant stenosis in the proximal left ICA Vertebral arteries: Mild calcifications in the right V1 segment, which are not hemodynamically significant. The left vertebral artery is somewhat small but otherwise unremarkable. No evidence of dissection or occlusion. Skeleton: 5 mm anterolisthesis of C3  on C4, which appears degenerative and longstanding. Status post ACDF C5-T1. No acute osseous abnormality. Other neck: Negative. Upper chest: Apical pleural-parenchymal scarring. No pleural effusion. Review of the MIP images confirms the above findings CTA HEAD FINDINGS Anterior circulation: Both internal carotid arteries are patent to the termini, with  calcifications in the bilateral cavernous and supraclinoid segments, which cause mild stenosis. A1 segments patent. Normal anterior communicating artery. Anterior cerebral arteries are patent to their distal aspects. No M1 stenosis or occlusion. Normal MCA bifurcations. Distal MCA branches perfused and symmetric. Posterior circulation: The left vertebral artery primarily supplies PICA. The right vertebral artery is patent to the vertebrobasilar junction, with minimal calcifications which are not hemodynamically significant. Posterior inferior cerebral arteries patent bilaterally. Basilar patent to its distal aspect. Superior cerebellar arteries patent bilaterally. Bilateral P1 segments originate from the basilar artery. The right posterior communicating artery is visualized. The left posterior communicating artery is not definitively seen. PCAs perfused to their distal aspects without stenosis. Venous sinuses: As permitted by contrast timing, patent. Anatomic variants: None significant Review of the MIP images confirms the above findings IMPRESSION: 1. No intracranial large vessel occlusion. Mild stenosis in the right cavernous and supraclinoid ICA without additional hemodynamically significant intracranial stenosis. 2. Approximately 70% stenosis in the distal left common carotid artery and at the bifurcation, without significant stenosis in the proximal left ICA. No other hemodynamically significant stenosis in the neck. Electronically Signed   By: Merilyn Baba M.D.   On: 05/17/2021 19:38       Assessment & Plan:    Principal Problem:   TIA (transient  ischemic attack) Active Problems:   Persistent atrial fibrillation (HCC)   Essential hypertension   Hypothyroidism   TIA work-up 3 episodes of partial left eye vision loss CT head and CTA head and neck showed no acute findings MRI brain with and without contrast in a.m. as recommended by neurology MRI orbits with and without contrast in the a.m. as recommended by neurology Echo in the a.m. Continue Eliquis Permissive hypertension Inpatient consult to neurology Persistent atrial fibrillation Continue rate control with metoprolol and Eliquis for anticoagulation Essential hypertension Permissive hypertension as part of TIA work-up Chronic pain Continue hydrocodone and gabapentin Hypothyroidism Continue Synthroid   DVT Prophylaxis-   Eliquis- SCDs   AM Labs Ordered, also please review Full Orders  Family Communication: Admission, patients condition and plan of care including tests being ordered have been discussed with the patient and daughter-in-law who indicate understanding and agree with the plan and Code Status.  Code Status: Full  Admission status: Observation Disposition: Anticipated Discharge date 24 hours discharge to home  Time spent in minutes : [place]

## 2021-05-17 NOTE — ED Triage Notes (Signed)
States he has had visual problems for the last 2 months, states last episode was 2 days ago, states he was advised by his cardiologist to come in for evaluation. Has already seen his eye doctor for same

## 2021-05-17 NOTE — ED Provider Notes (Signed)
[facility] EMERGENCY DEPARTMENT Provider Note   CSN: [id number] Arrival date & time: 05/17/21  1536     History  No chief complaint on file.   Wayne Ortiz is a 76 y.o. male.  HPI  This patient is a 76 year old male, he has a history of atrial fibrillation which appears paroxysmal and is on Eliquis twice daily for quite some time.  He was actually scheduled for a cardioversion to be performed this last week however when they arrived at the hospital at [facility] he was no longer in A. fib and the procedure was discontinued.  He reports that he has had 3 discrete episodes over the last couple of months where he has painless loss of vision in his left eye, most recently occurred a couple of days ago however he went to follow-up with an eye doctor, his symptoms were completely gone on that exam and he had a normal exam, he was referred to cardiology.  When he called the cardiology office and gave them his symptoms they recommended he come to the emergency department immediately for stroke evaluation.  He reports that each of these circumstances when he loses vision lasts less than 1 minute, resolved spontaneously and it tends to be his temporal vision.  There is no weakness numbness changes in speech or facial droop.  Home Medications Prior to Admission medications   Medication Sig Start Date End Date Taking? Authorizing Provider  acetaminophen (TYLENOL) 500 MG tablet Take 1,000 mg by mouth every 6 (six) hours as needed for mild pain or fever.    [provider]  allopurinol (ZYLOPRIM) 300 MG tablet Take 300 mg by mouth every morning.     [provider]  cyclobenzaprine (FLEXERIL) 5 MG tablet Take 1 tablet (5 mg total) by mouth 3 (three) times daily as needed for muscle spasms. 03/06/21   Irving Copas, PA-C  docusate sodium (COLACE) 100 MG capsule Take 100-200 mg by mouth 2 (two) times daily as needed for constipation.    [provider]  ELIQUIS 5 MG TABS tablet  TAKE 1 TABLET BY MOUTH TWICE A DAY 02/03/18   Belva Crome, MD  furosemide (LASIX) 40 MG tablet TAKE ONE TABLET BY MOUTH DAILY,MAY TAKE EXTRA TAB DAILY AS NEEDED FOR WEIGHT GAIN/SWELLING Patient taking differently: Take 40 mg by mouth daily. 11/17/16   Sherran Needs, NP  gabapentin (NEURONTIN) 300 MG capsule Take 900 mg by mouth at bedtime. 12/13/20   [provider]  oxyCODONE (OXY IR/ROXICODONE) 5 MG immediate release tablet Take 1-2 tablets (5-10 mg total) by mouth every 4 (four) hours as needed for severe pain. 03/06/21   Irving Copas, PA-C  polyethylene glycol (MIRALAX / GLYCOLAX) 17 g packet Take 17 g by mouth daily as needed for mild constipation. 03/06/21   Irving Copas, PA-C  SYNTHROID 112 MCG tablet Take 112 mcg by mouth daily at 2 am. 12/03/20   [provider]      Allergies    Amiodarone, Atorvastatin, Dilaudid [hydromorphone], Lisinopril, Metoprolol, Rosuvastatin, Diltiazem, and Rocephin [ceftriaxone sodium in dextrose]    Review of Systems   Review of Systems  All other systems reviewed and are negative.  Physical Exam Updated Vital Signs BP 115/64    Pulse 97    Temp 98 F (36.7 C) (Oral)    Resp 18    SpO2 97%  Physical Exam Vitals and nursing note reviewed.  Constitutional:      General: He is  not in acute distress.    Appearance: He is well-developed.  HENT:     Head: Normocephalic and atraumatic.     Mouth/Throat:     Pharynx: No oropharyngeal exudate.  Eyes:     General: No scleral icterus.       Right eye: No discharge.        Left eye: No discharge.     Conjunctiva/sclera: Conjunctivae normal.     Pupils: Pupils are equal, round, and reactive to light.  Neck:     Thyroid: No thyromegaly.     Vascular: No JVD.  Cardiovascular:     Rate and Rhythm: Normal rate and regular rhythm.     Heart sounds: Normal heart sounds. No murmur heard.   No friction rub. No gallop.  Pulmonary:     Effort: Pulmonary effort is normal. No  respiratory distress.     Breath sounds: Normal breath sounds. No wheezing or rales.  Abdominal:     General: Bowel sounds are normal. There is no distension.     Palpations: Abdomen is soft. There is no mass.     Tenderness: There is no abdominal tenderness.  Musculoskeletal:        General: No tenderness. Normal range of motion.     Cervical back: Normal range of motion and neck supple.  Lymphadenopathy:     Cervical: No cervical adenopathy.  Skin:    General: Skin is warm and dry.     Findings: No erythema or rash.  Neurological:     Mental Status: He is alert.     Coordination: Coordination normal.     Comments: Speech is clear, cranial nerves III through XII are intact, memory is intact, strength is normal in all 4 extremities including grips, sensation is intact to light touch and pinprick in all 4 extremities. Coordination as tested by finger-nose-finger is normal, no limb ataxia. Normal gait, normal reflexes at the patellar tendons bilaterally  Psychiatric:        Behavior: Behavior normal.    ED Results / Procedures / Treatments   Labs (all labs ordered are listed, but only abnormal results are displayed) Labs Reviewed  CBC - Abnormal; Notable for the following components:      Result Value   MCV 103.6 (*)    MCH 35.0 (*)    Platelets 145 (*)    All other components within normal limits  RESP PANEL BY RT-PCR (FLU A&B, COVID) ARPGX2  ETHANOL  PROTIME-INR  APTT  DIFFERENTIAL  COMPREHENSIVE METABOLIC PANEL  RAPID URINE DRUG SCREEN, HOSP PERFORMED  URINALYSIS, ROUTINE W REFLEX MICROSCOPIC  I-STAT CHEM 8, ED    EKG EKG Interpretation  Date/Time:  Friday May 17 2021 16:02:10 EST Ventricular Rate:  88 PR Interval:    QRS Duration: 92 QT Interval:  380 QTC Calculation: 459 R Axis:   2 Text Interpretation: Atrial fibrillation Nonspecific ST and T wave abnormality Abnormal ECG When compared with ECG of 02-Mar-2019 10:45, PREVIOUS ECG IS PRESENT afib present on  last EKG, NSR from 2018 Confirmed by Noemi Chapel [phone]) on 05/17/2021 4:04:53 PM  Radiology CT HEAD WO CONTRAST  Result Date: 05/17/2021 CLINICAL DATA:  Visual problems for 2 months, most recently 2 days ago. EXAM: CT HEAD WITHOUT CONTRAST TECHNIQUE: Contiguous axial images were obtained from the base of the skull through the vertex without intravenous contrast. RADIATION DOSE REDUCTION: This exam was performed according to the departmental dose-optimization program which includes automated exposure control, adjustment of the mA  and/or kV according to patient size and/or use of iterative reconstruction technique. COMPARISON:  None. FINDINGS: Brain: There is no evidence for acute hemorrhage, hydrocephalus, mass lesion, or abnormal extra-axial fluid collection. No definite CT evidence for acute infarction. Diffuse loss of parenchymal volume is consistent with atrophy. Patchy low attenuation in the deep hemispheric and periventricular white matter is nonspecific, but likely reflects chronic microvascular ischemic demyelination. Vascular: No hyperdense vessel or unexpected calcification. Skull: No evidence for fracture. No worrisome lytic or sclerotic lesion. Sinuses/Orbits: The visualized paranasal sinuses and mastoid air cells are clear. Visualized portions of the globes and intraorbital fat are unremarkable. Other: None. IMPRESSION: 1. No acute intracranial abnormality. 2. Atrophy with chronic small vessel ischemic disease. Electronically Signed   By: Misty Stanley M.D.   On: 05/17/2021 17:31    Procedures Procedures    Medications Ordered in ED Medications  iohexol (OMNIPAQUE) 350 MG/ML injection 75 mL (75 mLs Intravenous Contrast Given 05/17/21 1900)    ED Course/ Medical Decision Making/ A&P                           Medical Decision Making The patient has had signs and symptoms of amaurosis fugax, I suspect that this is related to underlying cerebrovascular disease of the brain, he will need  further work-up and consultation with neurology, the equivalent of transient ischemic attacks several over the last month make this very concerning.  EKG does show in fact that he is in atrial fibrillation at this time but is anticoagulated.  Per the [facility] notes there was concern about his compliance, he states that he is adamantly compliant with this medication but they were worried that there may be a left atrial thrombus  Amount and/or Complexity of Data Reviewed Independent Historian: caregiver    Details: Daughter at the bedside, confirming patient's symptoms External Data Reviewed: labs and radiology.    Details: Reviewed notes from outpatient setting including history of atrial fibrillation treatment with cardiology from [facility]: ordered. Radiology: ordered. Discussion of management or test interpretation with external provider(s): I discussed the care with Dr. Quinn Axe of the neurology service, she recommends that the patient have the following  1.  MRI of the brain and orbit with and without contrast 2.  CT angiogram of the head and the neck 3.  Echocardiogram of the heart 4.  Stay on Eliquis during hospitalization 5.  Allow permissive hypertension up to 701 systolic 6.  Inpatient neurology consult, can be done over the weekend, if that cannot occur Dr. Quinn Axe will take the phone call as she is on-call tomorrow  She recommends admission to the hospital  Risk Prescription drug management. Decision regarding hospitalization. Risk Details: Will discuss with the hospitalist regarding admission for the rest of the testing that is necessary.  Thankfully CT scans and labs have not given a definite etiology.          Final Clinical Impression(s) / ED Diagnoses Final diagnoses:  TIA (transient ischemic attack)      Noemi Chapel, MD 05/17/21 [phone]

## 2021-05-17 NOTE — Progress Notes (Signed)
Patient refusing admission at this time.

## 2021-05-17 NOTE — ED Notes (Signed)
Pt is now agreeable to go to [facility] for admission. EDP Miller to bedside to speak with pt and family.

## 2021-05-17 NOTE — ED Notes (Signed)
[facility] on unit to transport pt to [place] per MD order.

## 2021-05-18 ENCOUNTER — Observation Stay (HOSPITAL_COMMUNITY): Payer: Medicare HMO

## 2021-05-18 DIAGNOSIS — I110 Hypertensive heart disease with heart failure: Secondary | ICD-10-CM | POA: Diagnosis not present

## 2021-05-18 DIAGNOSIS — Z888 Allergy status to other drugs, medicaments and biological substances status: Secondary | ICD-10-CM | POA: Diagnosis not present

## 2021-05-18 DIAGNOSIS — E785 Hyperlipidemia, unspecified: Secondary | ICD-10-CM | POA: Diagnosis not present

## 2021-05-18 DIAGNOSIS — R29818 Other symptoms and signs involving the nervous system: Secondary | ICD-10-CM | POA: Diagnosis not present

## 2021-05-18 DIAGNOSIS — Z96653 Presence of artificial knee joint, bilateral: Secondary | ICD-10-CM | POA: Diagnosis present

## 2021-05-18 DIAGNOSIS — I10 Essential (primary) hypertension: Secondary | ICD-10-CM | POA: Diagnosis not present

## 2021-05-18 DIAGNOSIS — J984 Other disorders of lung: Secondary | ICD-10-CM | POA: Diagnosis not present

## 2021-05-18 DIAGNOSIS — R0609 Other forms of dyspnea: Secondary | ICD-10-CM | POA: Diagnosis not present

## 2021-05-18 DIAGNOSIS — R079 Chest pain, unspecified: Secondary | ICD-10-CM | POA: Diagnosis not present

## 2021-05-18 DIAGNOSIS — Z79899 Other long term (current) drug therapy: Secondary | ICD-10-CM | POA: Diagnosis not present

## 2021-05-18 DIAGNOSIS — Z9889 Other specified postprocedural states: Secondary | ICD-10-CM | POA: Diagnosis not present

## 2021-05-18 DIAGNOSIS — I2510 Atherosclerotic heart disease of native coronary artery without angina pectoris: Secondary | ICD-10-CM | POA: Diagnosis not present

## 2021-05-18 DIAGNOSIS — G8929 Other chronic pain: Secondary | ICD-10-CM | POA: Diagnosis present

## 2021-05-18 DIAGNOSIS — H539 Unspecified visual disturbance: Secondary | ICD-10-CM | POA: Diagnosis not present

## 2021-05-18 DIAGNOSIS — I6522 Occlusion and stenosis of left carotid artery: Secondary | ICD-10-CM | POA: Diagnosis not present

## 2021-05-18 DIAGNOSIS — I509 Heart failure, unspecified: Secondary | ICD-10-CM | POA: Diagnosis not present

## 2021-05-18 DIAGNOSIS — I9581 Postprocedural hypotension: Secondary | ICD-10-CM | POA: Diagnosis not present

## 2021-05-18 DIAGNOSIS — J90 Pleural effusion, not elsewhere classified: Secondary | ICD-10-CM | POA: Diagnosis not present

## 2021-05-18 DIAGNOSIS — G459 Transient cerebral ischemic attack, unspecified: Secondary | ICD-10-CM | POA: Diagnosis not present

## 2021-05-18 DIAGNOSIS — E669 Obesity, unspecified: Secondary | ICD-10-CM | POA: Diagnosis not present

## 2021-05-18 DIAGNOSIS — Z7901 Long term (current) use of anticoagulants: Secondary | ICD-10-CM | POA: Diagnosis not present

## 2021-05-18 DIAGNOSIS — E039 Hypothyroidism, unspecified: Secondary | ICD-10-CM | POA: Diagnosis not present

## 2021-05-18 DIAGNOSIS — Z885 Allergy status to narcotic agent status: Secondary | ICD-10-CM | POA: Diagnosis not present

## 2021-05-18 DIAGNOSIS — Z7982 Long term (current) use of aspirin: Secondary | ICD-10-CM | POA: Diagnosis not present

## 2021-05-18 DIAGNOSIS — I4819 Other persistent atrial fibrillation: Secondary | ICD-10-CM | POA: Diagnosis not present

## 2021-05-18 DIAGNOSIS — N400 Enlarged prostate without lower urinary tract symptoms: Secondary | ICD-10-CM | POA: Diagnosis present

## 2021-05-18 DIAGNOSIS — K219 Gastro-esophageal reflux disease without esophagitis: Secondary | ICD-10-CM | POA: Diagnosis present

## 2021-05-18 DIAGNOSIS — M109 Gout, unspecified: Secondary | ICD-10-CM | POA: Diagnosis present

## 2021-05-18 DIAGNOSIS — G453 Amaurosis fugax: Secondary | ICD-10-CM | POA: Diagnosis not present

## 2021-05-18 DIAGNOSIS — H538 Other visual disturbances: Secondary | ICD-10-CM | POA: Diagnosis not present

## 2021-05-18 DIAGNOSIS — I5032 Chronic diastolic (congestive) heart failure: Secondary | ICD-10-CM | POA: Diagnosis not present

## 2021-05-18 DIAGNOSIS — Z20822 Contact with and (suspected) exposure to covid-19: Secondary | ICD-10-CM | POA: Diagnosis not present

## 2021-05-18 DIAGNOSIS — Z87891 Personal history of nicotine dependence: Secondary | ICD-10-CM | POA: Diagnosis not present

## 2021-05-18 DIAGNOSIS — R579 Shock, unspecified: Secondary | ICD-10-CM | POA: Diagnosis not present

## 2021-05-18 DIAGNOSIS — J84112 Idiopathic pulmonary fibrosis: Secondary | ICD-10-CM | POA: Diagnosis not present

## 2021-05-18 DIAGNOSIS — Z881 Allergy status to other antibiotic agents status: Secondary | ICD-10-CM | POA: Diagnosis not present

## 2021-05-18 DIAGNOSIS — Z981 Arthrodesis status: Secondary | ICD-10-CM | POA: Diagnosis not present

## 2021-05-18 DIAGNOSIS — Z9861 Coronary angioplasty status: Secondary | ICD-10-CM | POA: Diagnosis not present

## 2021-05-18 DIAGNOSIS — R0602 Shortness of breath: Secondary | ICD-10-CM | POA: Diagnosis not present

## 2021-05-18 DIAGNOSIS — Z955 Presence of coronary angioplasty implant and graft: Secondary | ICD-10-CM | POA: Diagnosis not present

## 2021-05-18 DIAGNOSIS — E7800 Pure hypercholesterolemia, unspecified: Secondary | ICD-10-CM | POA: Diagnosis not present

## 2021-05-18 DIAGNOSIS — I4891 Unspecified atrial fibrillation: Secondary | ICD-10-CM | POA: Diagnosis not present

## 2021-05-18 DIAGNOSIS — Z452 Encounter for adjustment and management of vascular access device: Secondary | ICD-10-CM | POA: Diagnosis not present

## 2021-05-18 LAB — ECHOCARDIOGRAM COMPLETE
AR max vel: 2.07 cm2
AV Area VTI: 1.92 cm2
AV Area mean vel: 1.88 cm2
AV Mean grad: 2 mmHg
AV Peak grad: 3 mmHg
Ao pk vel: 0.87 m/s
Area-P 1/2: 3.5 cm2
MV VTI: 0.31 cm2
S' Lateral: 2 cm
Single Plane A4C EF: 47.9 %

## 2021-05-18 LAB — COMPREHENSIVE METABOLIC PANEL
ALT: 18 U/L (ref 0–44)
AST: 19 U/L (ref 15–41)
Albumin: 4.1 g/dL (ref 3.5–5.0)
Alkaline Phosphatase: 51 U/L (ref 38–126)
Anion gap: 8 (ref 5–15)
BUN: 18 mg/dL (ref 8–23)
CO2: 27 mmol/L (ref 22–32)
Calcium: 9.3 mg/dL (ref 8.9–10.3)
Chloride: 105 mmol/L (ref 98–111)
Creatinine, Ser: 1.1 mg/dL (ref 0.61–1.24)
GFR, Estimated: >60 mL/min (ref >60)
Glucose, Bld: 102 mg/dL — ABNORMAL HIGH (ref 70–99)
Potassium: 4.2 mmol/L (ref 3.5–5.1)
Sodium: 140 mmol/L (ref 135–145)
Total Bilirubin: 0.5 mg/dL (ref 0.3–1.2)
Total Protein: 6.7 g/dL (ref 6.5–8.1)

## 2021-05-18 LAB — LIPID PANEL
Cholesterol: 192 mg/dL (ref 0–200)
HDL: 46 mg/dL (ref >40)
LDL Cholesterol: 125 mg/dL — ABNORMAL HIGH (ref 0–99)
Total CHOL/HDL Ratio: 4.2 RATIO
Triglycerides: 105 mg/dL (ref <150)
VLDL: 21 mg/dL (ref 0–40)

## 2021-05-18 LAB — CBC
HCT: 48 % (ref 39.0–52.0)
Hemoglobin: 16 g/dL (ref 13.0–17.0)
MCH: 33.7 pg (ref 26.0–34.0)
MCHC: 33.3 g/dL (ref 30.0–36.0)
MCV: 101.1 fL — ABNORMAL HIGH (ref 80.0–100.0)
Platelets: 129 10*3/uL — ABNORMAL LOW (ref 150–400)
RBC: 4.75 MIL/uL (ref 4.22–5.81)
RDW: 13.2 % (ref 11.5–15.5)
WBC: 5.4 10*3/uL (ref 4.0–10.5)
nRBC: 0 % (ref 0.0–0.2)

## 2021-05-18 LAB — TSH: TSH: 8.129 u[IU]/mL — ABNORMAL HIGH (ref 0.350–4.500)

## 2021-05-18 MED ORDER — DIPHENHYDRAMINE HCL 25 MG PO CAPS
25.0000 mg | ORAL_CAPSULE | Freq: Once | ORAL | Status: AC
  Administered 2021-05-18: 25 mg via ORAL
  Filled 2021-05-18: qty 1

## 2021-05-18 MED ORDER — HEPARIN (PORCINE) 25000 UT/250ML-% IV SOLN
1100.0000 [IU]/h | INTRAVENOUS | Status: DC
  Administered 2021-05-18: 23:00:00 1500 [IU]/h via INTRAVENOUS
  Administered 2021-05-19: 16:00:00 1300 [IU]/h via INTRAVENOUS
  Filled 2021-05-18 (×3): qty 250

## 2021-05-18 MED ORDER — GADOBUTROL 1 MMOL/ML IV SOLN
10.0000 mL | Freq: Once | INTRAVENOUS | Status: AC | PRN
  Administered 2021-05-18: 10 mL via INTRAVENOUS

## 2021-05-18 MED ORDER — EZETIMIBE 10 MG PO TABS
10.0000 mg | ORAL_TABLET | Freq: Every day | ORAL | Status: DC
  Administered 2021-05-18 – 2021-05-23 (×5): 10 mg via ORAL
  Filled 2021-05-18 (×5): qty 1

## 2021-05-18 MED ORDER — ASPIRIN 325 MG PO TABS
325.0000 mg | ORAL_TABLET | Freq: Every day | ORAL | Status: DC
  Administered 2021-05-18: 325 mg via ORAL
  Filled 2021-05-18: qty 1

## 2021-05-18 MED ORDER — ASPIRIN 81 MG PO CHEW: 81.0000 mg | CHEWABLE_TABLET | Freq: Every day | ORAL | Status: DC

## 2021-05-18 MED ORDER — PANTOPRAZOLE SODIUM 40 MG PO TBEC
40.0000 mg | DELAYED_RELEASE_TABLET | Freq: Every day | ORAL | Status: DC
  Administered 2021-05-18 – 2021-05-23 (×5): 40 mg via ORAL
  Filled 2021-05-18 (×5): qty 1

## 2021-05-18 MED ORDER — NIACIN 100 MG PO TABS
100.0000 mg | ORAL_TABLET | Freq: Two times a day (BID) | ORAL | Status: DC
  Administered 2021-05-18 – 2021-05-23 (×9): 100 mg via ORAL
  Filled 2021-05-18 (×12): qty 1

## 2021-05-18 NOTE — Progress Notes (Signed)
PROGRESS NOTE                                                                                                                                                                                                             Patient Demographics:    Wayne Ortiz, is a 76 y.o. male, DOB - June 06, 1945, UDJ:[id number]  Outpatient Primary MD for the patient is Celene Squibb, MD    LOS - 0  Admit date - 05/17/2021    No chief complaint on file.      Brief Narrative (HPI from H&P)  -  Wayne Ortiz  is a 76 y.o. male, with history of atrial fibrillation, BPH, coronary artery disease, GERD, history of gout, hypothyroidism, and more presents ED with a chief complaint of partial vision loss.  According to the patient over the last several weeks he has had partial vision loss which is transient in his left eye, he saw an ophthalmologist and he was asked to come to the ER.  He presented to [facility], ER and was transferred to [facility] for further neurological work-up.  Initial work-up is positive for left ICA 70% lesion which likely is the cause of his vision symptoms.   Subjective:    Wayne Ortiz today has, No headache, No chest pain, No abdominal pain - No Nausea, No new weakness tingling or numbness, no shortness of breath, vision is stable right now.   Assessment  & Plan :    No notes have been filed under this hospital service. 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  Patient to be seen by neurology and vascular surgery currently placed on aspirin, niacin due to statin allergy, heparin drip, likely undergoing left carotid endarterectomy this admission by BVS.  Will monitor closely.  Thankfully he does not have a stroke on MRI.  We will continue to monitor.  2.  Dyslipidemia.  Has listed multiple statin allergies.  Placed on niacin.  3.  Hypothyroidism.  On  Synthroid.  4.  Hypertension.  Continue beta-blocker at home dose.  5.  Chronic pain.  Supportive care.  6.  Paroxysmal atrial fibrillation [place] vas 2 score of greater than 3.  On beta-blocker.  Eliquis has been transitioned to heparin for upcoming surgery.  Condition - Extremely Guarded  Family Communication  :  None present  Code Status :  Full  Consults  :  Neuro, VVS  PUD Prophylaxis :    Procedures  :     MR brain orbits - 1. Normal MRI appearance of the orbits. 2. No acute intracranial abnormality. Mild to moderate for age signal changes in the cerebral white matter and basal ganglia most commonly due to chronic small vessel disease.   MR Brain -nonacute MRI of the brain with old small lacunar infarcts.  CTA - 1. No intracranial large vessel occlusion. Mild stenosis in the right cavernous and supraclinoid ICA without additional hemodynamically significant intracranial stenosis. 2. Approximately 70% stenosis in the distal left common carotid artery and at the bifurcation, without significant stenosis in the proximal left ICA. No other hemodynamically significant stenosis in the neck.      Disposition Plan  :    Status is: Observation   DVT Prophylaxis  :  Hep gtt     Lab Results  Component Value Date   PLT 129 (L) 05/18/2021    Diet :  Diet Order             Diet Heart Room service appropriate? Yes; Fluid consistency: Thin  Diet effective now                    Inpatient Medications  Scheduled Meds:   stroke: mapping our early stages of recovery book   Does not apply Once   aspirin  325 mg Oral Daily   furosemide  40 mg Oral Daily   gabapentin  900 mg Oral QHS   HYDROcodone-acetaminophen  1 tablet Oral BID   levothyroxine  112 mcg Oral Q0200   metoprolol tartrate  25 mg Oral BID   Continuous Infusions: PRN Meds:.acetaminophen **OR** acetaminophen (TYLENOL) oral liquid 160 mg/5 mL **OR** acetaminophen, senna-docusate  Antibiotics  :     Anti-infectives (From admission, onward)    None        Time Spent in minutes  30   Wayne Ortiz M.D on 05/18/2021 at 11:05 AM  To page go to [email or web address]   [facility] -  Office  [phone]  See all Orders from today for further details    Objective:   Vitals:   05/17/21 2300 05/18/21 0149 05/18/21 0349 05/18/21 0900  BP: (!) 132/94 (!) 142/89 (!) 118/91   Pulse: 90     Resp: 17 16 16    Temp: 98.3 F (36.8 C) 97.9 F (36.6 C) (!) 97.5 F (36.4 C)   TempSrc: Oral Oral Oral   SpO2: 98% 96% 94%   Weight:    111.6 kg    Wt Readings from Last 3 Encounters:  05/18/21 111.6 kg  03/05/21 117 kg  02/27/21 117 kg     Intake/Output Summary (Last 24 hours) at 05/18/2021 1105 Last data filed at 05/18/2021 0400 Gross per 24 hour  Intake --  Output 325 ml  Net -325 ml     Physical Exam  Awake Alert, No new F.N deficits, Normal affect [place].AT,PERRAL Supple Neck, No JVD,   Symmetrical Chest wall movement, Good air movement bilaterally, CTAB RRR,No Gallops,Rubs or new Murmurs,  +ve B.Sounds, Abd Soft, No tenderness,   No Cyanosis, Clubbing or edema       Data Review:    CBC Recent Labs  Lab 05/17/21 1711 05/18/21 0059  WBC 5.3 5.4  HGB 16.7 16.0  HCT 49.4 48.0  PLT  145* 129*  MCV 103.6* 101.1*  MCH 35.0* 33.7  MCHC 33.8 33.3  RDW 13.4 13.2  LYMPHSABS 1.6  --   MONOABS 0.5  --   EOSABS 0.0  --   BASOSABS 0.0  --     Electrolytes Recent Labs  Lab 05/17/21 1711 05/18/21 0059 05/18/21 0928  NA 142 140  --   K 4.0 4.2  --   CL 105 105  --   CO2 26 27  --   GLUCOSE 98 102*  --   BUN 21 18  --   CREATININE 1.15 1.10  --   CALCIUM 9.2 9.3  --   AST 22 19  --   ALT 20 18  --   ALKPHOS 53 51  --   BILITOT 0.5 0.5  --   ALBUMIN 4.4 4.1  --   INR 1.1  --   --   TSH  --   --  8.129*    ------------------------------------------------------------------------------------------------------------------ Recent Labs    05/18/21 0059   CHOL 192  HDL 46  LDLCALC 125*  TRIG 105  CHOLHDL 4.2    Lab Results  Component Value Date   HGBA1C 5.8 (H) 08/08/2014    Recent Labs    05/18/21 0928  TSH 8.129*   ------------------------------------------------------------------------------------------------------------------ ID Labs Recent Labs  Lab 05/17/21 1711 05/18/21 0059  WBC 5.3 5.4  PLT 145* 129*  CREATININE 1.15 1.10   Cardiac Enzymes No results for input(s): CKMB, TROPONINI, MYOGLOBIN in the last 168 hours.  Invalid input(s): CK   Radiology Reports CT Angio Head W or Wo Contrast  Result Date: 05/17/2021 CLINICAL DATA:  Neuro deficit, stroke suspected, visual problems EXAM: CT ANGIOGRAPHY HEAD AND NECK TECHNIQUE: Multidetector CT imaging of the head and neck was performed using the standard protocol during bolus administration of intravenous contrast. Multiplanar CT image reconstructions and MIPs were obtained to evaluate the vascular anatomy. Carotid stenosis measurements (when applicable) are obtained utilizing NASCET criteria, using the distal internal carotid diameter as the denominator. RADIATION DOSE REDUCTION: This exam was performed according to the departmental dose-optimization program which includes automated exposure control, adjustment of the mA and/or kV according to patient size and/or use of iterative reconstruction technique. CONTRAST:  43mL OMNIPAQUE IOHEXOL 350 MG/ML SOLN COMPARISON:  Prior CTA, correlation is made with CT head 05/17/2021 FINDINGS: CT HEAD FINDINGS For noncontrast findings, please see same day CT head. CTA NECK FINDINGS Aortic arch: Two-vessel arch with a common origin of the brachiocephalic and left common carotid arteries. Imaged portion shows no evidence of aneurysm or dissection. No significant stenosis of the major arch vessel origins. Aortic atherosclerosis Right carotid system: No evidence of dissection, stenosis (50% or greater) or occlusion. Left carotid system:  Approximately 70% stenosis in the distal left common carotid and at the bifurcation (series 7, image 223) without hemodynamically significant stenosis in the proximal left ICA Vertebral arteries: Mild calcifications in the right V1 segment, which are not hemodynamically significant. The left vertebral artery is somewhat small but otherwise unremarkable. No evidence of dissection or occlusion. Skeleton: 5 mm anterolisthesis of C3 on C4, which appears degenerative and longstanding. Status post ACDF C5-T1. No acute osseous abnormality. Other neck: Negative. Upper chest: Apical pleural-parenchymal scarring. No pleural effusion. Review of the MIP images confirms the above findings CTA HEAD FINDINGS Anterior circulation: Both internal carotid arteries are patent to the termini, with calcifications in the bilateral cavernous and supraclinoid segments, which cause mild stenosis. A1 segments patent. Normal anterior communicating  artery. Anterior cerebral arteries are patent to their distal aspects. No M1 stenosis or occlusion. Normal MCA bifurcations. Distal MCA branches perfused and symmetric. Posterior circulation: The left vertebral artery primarily supplies PICA. The right vertebral artery is patent to the vertebrobasilar junction, with minimal calcifications which are not hemodynamically significant. Posterior inferior cerebral arteries patent bilaterally. Basilar patent to its distal aspect. Superior cerebellar arteries patent bilaterally. Bilateral P1 segments originate from the basilar artery. The right posterior communicating artery is visualized. The left posterior communicating artery is not definitively seen. PCAs perfused to their distal aspects without stenosis. Venous sinuses: As permitted by contrast timing, patent. Anatomic variants: None significant Review of the MIP images confirms the above findings IMPRESSION: 1. No intracranial large vessel occlusion. Mild stenosis in the right cavernous and  supraclinoid ICA without additional hemodynamically significant intracranial stenosis. 2. Approximately 70% stenosis in the distal left common carotid artery and at the bifurcation, without significant stenosis in the proximal left ICA. No other hemodynamically significant stenosis in the neck. Electronically Signed   By: Merilyn Baba M.D.   On: 05/17/2021 19:38   CT HEAD WO CONTRAST  Result Date: 05/17/2021 CLINICAL DATA:  Visual problems for 2 months, most recently 2 days ago. EXAM: CT HEAD WITHOUT CONTRAST TECHNIQUE: Contiguous axial images were obtained from the base of the skull through the vertex without intravenous contrast. RADIATION DOSE REDUCTION: This exam was performed according to the departmental dose-optimization program which includes automated exposure control, adjustment of the mA and/or kV according to patient size and/or use of iterative reconstruction technique. COMPARISON:  None. FINDINGS: Brain: There is no evidence for acute hemorrhage, hydrocephalus, mass lesion, or abnormal extra-axial fluid collection. No definite CT evidence for acute infarction. Diffuse loss of parenchymal volume is consistent with atrophy. Patchy low attenuation in the deep hemispheric and periventricular white matter is nonspecific, but likely reflects chronic microvascular ischemic demyelination. Vascular: No hyperdense vessel or unexpected calcification. Skull: No evidence for fracture. No worrisome lytic or sclerotic lesion. Sinuses/Orbits: The visualized paranasal sinuses and mastoid air cells are clear. Visualized portions of the globes and intraorbital fat are unremarkable. Other: None. IMPRESSION: 1. No acute intracranial abnormality. 2. Atrophy with chronic small vessel ischemic disease. Electronically Signed   By: Misty Stanley M.D.   On: 05/17/2021 17:31   CT Angio Neck W and/or Wo Contrast  Result Date: 05/17/2021 CLINICAL DATA:  Neuro deficit, stroke suspected, visual problems EXAM: CT ANGIOGRAPHY  HEAD AND NECK TECHNIQUE: Multidetector CT imaging of the head and neck was performed using the standard protocol during bolus administration of intravenous contrast. Multiplanar CT image reconstructions and MIPs were obtained to evaluate the vascular anatomy. Carotid stenosis measurements (when applicable) are obtained utilizing NASCET criteria, using the distal internal carotid diameter as the denominator. RADIATION DOSE REDUCTION: This exam was performed according to the departmental dose-optimization program which includes automated exposure control, adjustment of the mA and/or kV according to patient size and/or use of iterative reconstruction technique. CONTRAST:  26mL OMNIPAQUE IOHEXOL 350 MG/ML SOLN COMPARISON:  Prior CTA, correlation is made with CT head 05/17/2021 FINDINGS: CT HEAD FINDINGS For noncontrast findings, please see same day CT head. CTA NECK FINDINGS Aortic arch: Two-vessel arch with a common origin of the brachiocephalic and left common carotid arteries. Imaged portion shows no evidence of aneurysm or dissection. No significant stenosis of the major arch vessel origins. Aortic atherosclerosis Right carotid system: No evidence of dissection, stenosis (50% or greater) or occlusion. Left carotid system: Approximately 70% stenosis  in the distal left common carotid and at the bifurcation (series 7, image 223) without hemodynamically significant stenosis in the proximal left ICA Vertebral arteries: Mild calcifications in the right V1 segment, which are not hemodynamically significant. The left vertebral artery is somewhat small but otherwise unremarkable. No evidence of dissection or occlusion. Skeleton: 5 mm anterolisthesis of C3 on C4, which appears degenerative and longstanding. Status post ACDF C5-T1. No acute osseous abnormality. Other neck: Negative. Upper chest: Apical pleural-parenchymal scarring. No pleural effusion. Review of the MIP images confirms the above findings CTA HEAD FINDINGS  Anterior circulation: Both internal carotid arteries are patent to the termini, with calcifications in the bilateral cavernous and supraclinoid segments, which cause mild stenosis. A1 segments patent. Normal anterior communicating artery. Anterior cerebral arteries are patent to their distal aspects. No M1 stenosis or occlusion. Normal MCA bifurcations. Distal MCA branches perfused and symmetric. Posterior circulation: The left vertebral artery primarily supplies PICA. The right vertebral artery is patent to the vertebrobasilar junction, with minimal calcifications which are not hemodynamically significant. Posterior inferior cerebral arteries patent bilaterally. Basilar patent to its distal aspect. Superior cerebellar arteries patent bilaterally. Bilateral P1 segments originate from the basilar artery. The right posterior communicating artery is visualized. The left posterior communicating artery is not definitively seen. PCAs perfused to their distal aspects without stenosis. Venous sinuses: As permitted by contrast timing, patent. Anatomic variants: None significant Review of the MIP images confirms the above findings IMPRESSION: 1. No intracranial large vessel occlusion. Mild stenosis in the right cavernous and supraclinoid ICA without additional hemodynamically significant intracranial stenosis. 2. Approximately 70% stenosis in the distal left common carotid artery and at the bifurcation, without significant stenosis in the proximal left ICA. No other hemodynamically significant stenosis in the neck. Electronically Signed   By: Merilyn Baba M.D.   On: 05/17/2021 19:38   MR BRAIN W WO CONTRAST  Result Date: 05/18/2021 CLINICAL DATA:  76 year old male with vision problems for the past 2 months. Neurologic deficit. EXAM: MRI HEAD AND ORBITS WITHOUT AND WITH CONTRAST TECHNIQUE: Multiplanar, multiecho pulse sequences of the brain and surrounding structures were obtained without and with intravenous contrast.  Multiplanar, multiecho pulse sequences of the orbits and surrounding structures were obtained including fat saturation techniques, before and after intravenous contrast administration. CONTRAST:  50mL GADAVIST GADOBUTROL 1 MMOL/ML IV SOLN COMPARISON:  CTA head and neck and head CT yesterday. FINDINGS: MRI HEAD FINDINGS Brain: No restricted diffusion to suggest acute infarction. No midline shift, mass effect, evidence of mass lesion, ventriculomegaly, extra-axial collection or acute intracranial hemorrhage. Cervicomedullary junction and pituitary are within normal limits. Scattered patchy cerebral white matter T2 and FLAIR hyperintensity in both hemispheres, mild to moderate for age. No cortical encephalomalacia or chronic cerebral blood products identified. 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 Mild T2 heterogeneity in the bilateral basal ganglia. Thalami, brainstem and cerebellum are within normal limits. No abnormal enhancement identified. No dural thickening. Vascular: Major intracranial vascular flow voids are preserved. Dominant right vertebral artery as seen on CTA. The major dural venous sinuses are enhancing and appear patent. Skull and upper cervical spine: Negative visible cervical spine. Visualized bone marrow signal is within normal limits. Other: Right anterior scalp susceptibility artifact related to punctate metallic foreign body retained as seen on recent head CT. But otherwise negative visible scalp and face soft tissues. Visible internal auditory structures appear normal. MRI ORBITS FINDINGS Orbits: Normal suprasellar cistern and optic chiasm. Cavernous sinus appears symmetric and normal.  Bilateral optic nerves appear symmetric and within normal limits. No intraorbital mass or inflammation. No abnormal intraorbital enhancement identified. Postoperative changes to both globes which otherwise appear normal. Visualized sinuses: Negative. Soft tissues: Negative periorbital  and visible deep soft tissue spaces of the face. IMPRESSION: 1. Normal MRI appearance of the orbits. 2. No acute intracranial abnormality. Mild to moderate for age signal changes in the cerebral white matter and basal ganglia most commonly due to chronic small vessel disease. Electronically Signed   By: Genevie Ann M.D.   On: 05/18/2021 07:09   MR ORBITS W WO CONTRAST  Result Date: 05/18/2021 CLINICAL DATA:  76 year old male with vision problems for the past 2 months. Neurologic deficit. EXAM: MRI HEAD AND ORBITS WITHOUT AND WITH CONTRAST TECHNIQUE: Multiplanar, multiecho pulse sequences of the brain and surrounding structures were obtained without and with intravenous contrast. Multiplanar, multiecho pulse sequences of the orbits and surrounding structures were obtained including fat saturation techniques, before and after intravenous contrast administration. CONTRAST:  52mL GADAVIST GADOBUTROL 1 MMOL/ML IV SOLN COMPARISON:  CTA head and neck and head CT yesterday. FINDINGS: MRI HEAD FINDINGS Brain: No restricted diffusion to suggest acute infarction. No midline shift, mass effect, evidence of mass lesion, ventriculomegaly, extra-axial collection or acute intracranial hemorrhage. Cervicomedullary junction and pituitary are within normal limits. Scattered patchy cerebral white matter T2 and FLAIR hyperintensity in both hemispheres, mild to moderate for age. No cortical encephalomalacia or chronic cerebral blood products identified. 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 Mild T2 heterogeneity in the bilateral basal ganglia. Thalami, brainstem and cerebellum are within normal limits. No abnormal enhancement identified. No dural thickening. Vascular: Major intracranial vascular flow voids are preserved. Dominant right vertebral artery as seen on CTA. The major dural venous sinuses are enhancing and appear patent. Skull and upper cervical spine: Negative visible cervical spine. Visualized  bone marrow signal is within normal limits. Other: Right anterior scalp susceptibility artifact related to punctate metallic foreign body retained as seen on recent head CT. But otherwise negative visible scalp and face soft tissues. Visible internal auditory structures appear normal. MRI ORBITS FINDINGS Orbits: Normal suprasellar cistern and optic chiasm. Cavernous sinus appears symmetric and normal. Bilateral optic nerves appear symmetric and within normal limits. No intraorbital mass or inflammation. No abnormal intraorbital enhancement identified. Postoperative changes to both globes which otherwise appear normal. Visualized sinuses: Negative. Soft tissues: Negative periorbital and visible deep soft tissue spaces of the face. IMPRESSION: 1. Normal MRI appearance of the orbits. 2. No acute intracranial abnormality. Mild to moderate for age signal changes in the cerebral white matter and basal ganglia most commonly due to chronic small vessel disease. Electronically Signed   By: Genevie Ann M.D.   On: 05/18/2021 07:09
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
## 2021-05-18 NOTE — Progress Notes (Signed)
OT Cancellation Note  Patient Details Name: Wayne Ortiz MRN: [id number] DOB: 1946/04/17   Cancelled Treatment:    Reason Eval/Treat Not Completed: Patient at procedure or test/ unavailable. PT leaving floor for vascular test. OT will follow up as time allows.   Chaun Uemura H., OTR/L Acute Rehabilitation  [employer] [employer] [facility] Yolanda Bonine 05/18/2021, 11:07 AM

## 2021-05-18 NOTE — Progress Notes (Signed)
Carotid artery duplex completed. Refer to "CV Proc" under chart review to view preliminary results.  05/18/2021 11:22 AM Kelby Aline., MHA, RVT, RDCS, RDMS

## 2021-05-18 NOTE — Consult Note (Signed)
Neurology Consultation  Reason for Consult: Amaurosis Fugax Referring Physician: P. Candiss Norse, MD.   CC: 3 episodes of amaurosis fugax  History is obtained from: patient, chart.   HPI: Wayne Ortiz is a 76 y.o. male with a PMHx of PAF, CAD s/p DES 2016,  GERD, BPH, gout, and hypothyroidism who presented to the [facility] ED for 3 episodes of partial vision loss in OS. Loss is the temporal half of his visual field in OS. First episode 3 months ago and 2nd and 3rd episodes were in the past 2 days. He decribes it as a curtain coming across his eyes from temporal region to middle of eye. He called ophthalmologist and was told to come to ED immediately.    Today, he has no vision disturbance and no episodes since 3rd one. He sees very well out of each eye and when either eye is covered. No pain, n/v, photophobia, weakness of a particular limb, dysarthria, or dysphagia. Never had anything like this before. No specific activity caused vision change and it resolved spontaneously.   Vascular surgeon visited patient this am and plans CEA left on Tues after Eliquis washes out. Surgeon explained operation with benefits and risks per patient.   NP discussed with patient that these events of vision loss were caused by the 70% stenosis in the left carotid artery. MD told him same.   NP explained need for anti platelet therapy as well as Eliquis.   Neurology asked to consult due to  amaurosis fugax.   ROS: A robust ROS was performed and is negative except as noted in the HPI.  Past Medical History:  Diagnosis Date   Arthritis    Atrial fibrillation (HCC)    BPH (benign prostatic hypertrophy)    CAD (coronary artery disease)    a. s/p DES 05/2014 at [facility])    Complication of anesthesia    BECAME COMBATIVE   Dyspnea    Dysrhythmia    Femur fracture (HCC)    GERD (gastroesophageal reflux disease)    occasional TUMS   Gout    History of gout    History of kidney stones    Hypertension     Hypothyroidism    Persistent atrial fibrillation (HCC)    Precancerous skin lesion    Renal disorder     Family History  Problem Relation Age of Onset   Kidney Stones Mother    Social History:   reports that he quit smoking about 48 years ago. His smoking use included cigarettes. He has a 20.00 pack-year smoking history. He has never used smokeless tobacco. He reports that he does not currently use alcohol. He reports that he does not use drugs.  Medications  Current Facility-Administered Medications:     stroke: mapping our early stages of recovery book, , Does not apply, Once, Zierle-Ghosh, [place] B, DO   acetaminophen (TYLENOL) tablet 650 mg, 650 mg, Oral, Q4H PRN **OR** acetaminophen (TYLENOL) 160 MG/5ML solution 650 mg, 650 mg, Per Tube, Q4H PRN **OR** acetaminophen (TYLENOL) suppository 650 mg, 650 mg, Rectal, Q4H PRN, Zierle-Ghosh, Asia B, DO   apixaban (ELIQUIS) tablet 5 mg, 5 mg, Oral, BID, Zierle-Ghosh, Asia B, DO, 5 mg at 05/18/21 0151   aspirin tablet 325 mg, 325 mg, Oral, Daily, Kirby-Graham, Karsten Fells, NP   furosemide (LASIX) tablet 40 mg, 40 mg, Oral, Daily, Zierle-Ghosh, Asia B, DO   gabapentin (NEURONTIN) capsule 900 mg, 900 mg, Oral, QHS, Zierle-Ghosh, Asia B, DO, 900 mg  at 05/18/21 0150   HYDROcodone-acetaminophen (NORCO) 10-325 MG per tablet 1 tablet, 1 tablet, Oral, BID, Zierle-Ghosh, Asia B, DO, 1 tablet at 05/18/21 0153   levothyroxine (SYNTHROID) tablet 112 mcg, 112 mcg, Oral, Q0200, Zierle-Ghosh, Asia B, DO   metoprolol tartrate (LOPRESSOR) tablet 25 mg, 25 mg, Oral, BID, Zierle-Ghosh, Asia B, DO, 25 mg at 05/18/21 0153   senna-docusate (Senokot-S) tablet 1 tablet, 1 tablet, Oral, QHS PRN, Zierle-Ghosh, Asia B, DO  Exam: Current vital signs: BP (!) 118/91 (BP Location: Left Arm)    Pulse 90    Temp (!) 97.5 F (36.4 C) (Oral)    Resp 16    SpO2 94%  Vital signs in last 24 hours: Temp:  [97.5 F (36.4 C)-98.3 F (36.8 C)] 97.5 F (36.4 C) (01/21 0349) Pulse  Rate:  [83-97] 90 (01/20 2300) Resp:  [16-18] 16 (01/21 0349) BP: (115-142)/(64-94) 118/91 (01/21 0349) SpO2:  [93 %-99 %] 94 % (01/21 0349)  PE: GENERAL: Well appearing elderly male sitting up in bed. Alert, in NAD.  HEENT: normocephalic and atraumatic. LUNGS - Normal respiratory effort.  CV - RRR on tele. ABDOMEN - Soft, nontender. Ext: warm, well perfused. Psych: affect light.   NEURO:  Mental Status: Alert and oriented x4. Follows commands.  Speech/Language: speech is without dysarthria or aphasia.  Naming, repetition, fluency, and comprehension intact.  Cranial Nerves:  II: PERRL  20mm/brisk. visual fields full.  III, IV, VI: EOMI. Lid elevation symmetric and full.  V: sensation is intact and symmetrical to face.  VII: Smile is symmetrical. Able to puff cheeks and raise eyebrows.  VIII:hearing intact to voice. IX, X: palate elevation is symmetric. Phonation normal.  XI: normal sternocleidomastoid and trapezius muscle strength. VVO:HYWVPX is symmetrical without fasciculations.   Motor:  Strength is 5/5 throughout.  Tone is normal. Bulk is normal.  Sensation- Intact to light touch bilaterally in all four extremities. Extinction absent to DSS. Touching bilateral feet up to lower tibia causes tingling.  Coordination: FTN intact bilaterally. HKS intact bilaterally. No pronator drift.   NIHSS:  1a Level of Consciousness: 0 1b LOC Questions: 0 1c LOC Commands: 0 2 Best Gaze: 0 3 Visual: 0 4 Facial Palsy: 0 5a Motor Arm - left: 0 5b Motor Arm - Right: 0 6a Motor Leg - Left: 0 6b Motor Leg - Right: 0 7 Limb Ataxia: 0 8 Sensory: 0 9 Best Language: 0 10 Dysarthria: 0 11 Extinction and Inattention: 0 TOTAL: 0  Labs I have reviewed labs in epic and the results pertinent to this consultation are:  CBC    Component Value Date/Time   WBC 5.4 05/18/2021 0059   RBC 4.75 05/18/2021 0059   HGB 16.0 05/18/2021 0059   HGB 17.0 01/08/2017 1319   HCT 48.0 05/18/2021 0059    HCT 50.5 01/08/2017 1319   PLT 129 (L) 05/18/2021 0059   PLT 144 (L) 01/08/2017 1319   MCV 101.1 (H) 05/18/2021 0059   MCV 99 (H) 01/08/2017 1319   MCH 33.7 05/18/2021 0059   MCHC 33.3 05/18/2021 0059   RDW 13.2 05/18/2021 0059   RDW 14.6 01/08/2017 1319   LYMPHSABS 1.6 05/17/2021 1711   LYMPHSABS 1.1 01/08/2017 1319   MONOABS 0.5 05/17/2021 1711   EOSABS 0.0 05/17/2021 1711   EOSABS 0.1 01/08/2017 1319   BASOSABS 0.0 05/17/2021 1711   BASOSABS 0.0 01/08/2017 1319    CMP     Component Value Date/Time   NA 140 05/18/2021 0059   NA 145 (  H) 01/08/2017 1319   K 4.2 05/18/2021 0059   CL 105 05/18/2021 0059   CO2 27 05/18/2021 0059   GLUCOSE 102 (H) 05/18/2021 0059   BUN 18 05/18/2021 0059   BUN 20 01/08/2017 1319   CREATININE 1.10 05/18/2021 0059   CREATININE 1.08 03/08/2015 0950   CALCIUM 9.3 05/18/2021 0059   PROT 6.7 05/18/2021 0059   PROT 6.8 01/08/2017 1319   ALBUMIN 4.1 05/18/2021 0059   ALBUMIN 4.5 01/08/2017 1319   AST 19 05/18/2021 0059   ALT 18 05/18/2021 0059   ALKPHOS 51 05/18/2021 0059   BILITOT 0.5 05/18/2021 0059   BILITOT 0.4 01/08/2017 1319   GFRNONAA >60 05/18/2021 0059   GFRAA >60 03/11/2019 0200    Lipid Panel     Component Value Date/Time   CHOL 192 05/18/2021 0059   CHOL 183 06/30/2016 1115   TRIG 105 05/18/2021 0059   HDL 46 05/18/2021 0059   HDL 41 06/30/2016 1115   CHOLHDL 4.2 05/18/2021 0059   VLDL 21 05/18/2021 0059   LDLCALC 125 (H) 05/18/2021 0059   LDLCALC 118 (H) 06/30/2016 1115    Imaging MD reviewed the images obtained.  CT head 1. No acute intracranial abnormality. 2. Atrophy with chronic small vessel ischemic disease.  CTA head and neck 1. No intracranial large vessel occlusion. Mild stenosis in the right cavernous and supraclinoid ICA without additional hemodynamically significant intracranial stenosis. 2. Approximately 70% stenosis in the distal left common carotid artery and at the bifurcation, without significant  stenosis in the proximal left ICA. No other hemodynamically significant stenosis in the neck.  MRI brain and orbits.  1. Normal MRI appearance of the orbits. 2. No acute intracranial abnormality. Mild to moderate for age signal changes in the cerebral white matter and basal ganglia most commonly due to chronic small vessel disease.  Assessment: 76 yo male with stroke risk factors of HTN, obesity, HLD, DM II, PAF, and CAD who presented to ED on advice of ophthalmologist for 3 episodes of temporal vision loss to OS. His vision is back to normal, so likely amaurosis fugax. He has 80% (per vascular read) stenosis left carotid which is likely culprit for the vision loss. He will have surgery next Tues in able to "wash out" his Eliquis. We will start ASA 325mg  po qd for secondary stroke prevention and admit patient for stroke w/up.   Impression: -Amaurosis fugax secondary to carotid stenosis.  -Left Carotid stenosis, 80%.   Recommendations/Plan:  -ASA 325mg  po qd.  -stroke w/up.  -Admit by medicine.  -TSH done and elevated.  -HgbA1c with goal of < 7%.  -LDL too high. Chart says allergic to statins, but only Atorvastatin and Rosuvastatin. Maybe he will agree to take Simvastatin?  - Frequent neuro checks. - follow NIHSS - Echocardiogram-pending.  - Have contacted hospitalist to ask about Heparin bridge for stopping Eliquis.  - Risk factor modification - cardiac telemetry monitoring for arrhythmia - PT consult, OT consult, Speech consult - stroke education - Stroke team to follow  Seen by NP and later by MD. Note to be edited per MD as needed.  Clance Boll, MSN, APN-BC Neurology Nurse Practitioner Pager [phone]   NEUROHOSPITALIST ADDENDUM Performed a face to face diagnostic evaluation.   I have reviewed the contents of history and physical exam as documented by PA/ARNP/Resident and agree with above documentation.  I have discussed and formulated the above plan as  documented. Edits to the note have been made as needed.  Impression/Key  exam findings/Plan: 31M in good health, hx of afibb and compliant with Eliquis who presents with 3 episodes concerning for Amourosis Fugax of the left eye. Last one was last week and lasted 15-20 mins and he partially lost vision in left eye. Found to have 70% stenosis of the L ICA. Vasc surgery evaluated and plan for stroke workup along with Aspirin 325mg  daily and will switch from Eliquis to Heparin gtt for Anticoagulation in anticipation of intervention.  Reports calf soreness with statins in the past. Would benefit from PCSK9 inhibitors outpatient if his LDL is elevated. Stroke team to follow.  Donnetta Simpers, MD [facility] [phone]   If 7pm to 7am, please call on call as listed on AMION.

## 2021-05-18 NOTE — Consult Note (Addendum)
ASSESSMENT & PLAN   SYMPTOMATIC LEFT CAROTID STENOSIS: This patient has had 3 episodes of amaurosis fugax in the left eye.  He has an 70% left carotid stenosis which is likely the source of the symptoms.  He is on aspirin.  He does not tolerate statins.  He is on Eliquis for A. fib.  I would recommend left carotid endarterectomy in order to lower his risk of future stroke.  I have stopped his Eliquis and asked the pharmacy to start heparin.  I have ordered a carotid duplex to further evaluate the stenosis.   He does have a history of congestive heart failure and is followed by his cardiologist in [facility].  He has not had any recent cardiac symptoms.  He had an echo today.  Those results are pending.  Assuming he is cleared from medical standpoint I can proceed with left carotid endarterectomy on Tuesday.  I have reviewed the indications for carotid endarterectomy, that is to lower the risk of future stroke. I have also reviewed the potential complications of surgery, including but not limited to: bleeding, stroke (perioperative risk 1-2%), MI, nerve injury of other unpredictable medical problems. All of the patients questions were answered and they are agreeable to proceed with surgery.   REASON FOR CONSULT:    Symptomatic left carotid stenosis.  The consult is requested by Dr. Candiss Norse.   HISTORY:  Wayne Ortiz is a 76 y.o. male who has had 3 episodes of amaurosis fugax in the last 3 months.  His most recent episode was 2 days ago.  He experienced a brief loss of vision in the left eye which resolved after several minutes.  He is right-handed.  He denies any previous history of stroke, TIAs, expressive or receptive aphasia, or amaurosis fugax.  He was not on aspirin but was started on aspirin this admission.  He is on Eliquis for atrial fibrillation.  He has an allergy to statins.  His risk factors for peripheral vascular disease include hypertension, hypercholesterolemia, and a  remote history of tobacco use.  He denies any history of diabetes or family history of premature cardiovascular disease.  Past Medical History:  Diagnosis Date   Arthritis    Atrial fibrillation (HCC)    BPH (benign prostatic hypertrophy)    CAD (coronary artery disease)    a. s/p DES 05/2014 at [facility])    Complication of anesthesia    BECAME COMBATIVE   Dyspnea    Dysrhythmia    Femur fracture (HCC)    GERD (gastroesophageal reflux disease)    occasional TUMS   Gout    History of gout    History of kidney stones    Hypertension    Hypothyroidism    Persistent atrial fibrillation (HCC)    Precancerous skin lesion    Renal disorder     Family History  Problem Relation Age of Onset   Kidney Stones Mother     SOCIAL HISTORY: Social History   Tobacco Use   Smoking status: Former    Packs/day: 1.00    Years: 20.00    Pack years: 20.00    Types: Cigarettes    Quit date: 05/23/1973    Years since quitting: 48.0   Smokeless tobacco: Never  Substance Use Topics   Alcohol use: Not Currently    Allergies  Allergen Reactions   Amiodarone Shortness Of Breath   Atorvastatin Other (See Comments)    Arthralgias   Dilaudid [Hydromorphone]  Other (See Comments)    Shaking all over (chills/Malaise)   Lisinopril Other (See Comments)    CP and SOB   Metoprolol Other (See Comments)    CP and SOb   Rosuvastatin Other (See Comments)    Myalgias   Diltiazem Rash   Rocephin [Ceftriaxone Sodium In Dextrose] Rash    Current Facility-Administered Medications  Medication Dose Route Frequency Provider Last Rate Last Admin    stroke: mapping our early stages of recovery book   Does not apply Once Zierle-Ghosh, Asia B, DO       acetaminophen (TYLENOL) tablet 650 mg  650 mg Oral Q4H PRN Zierle-Ghosh, Asia B, DO       Or   acetaminophen (TYLENOL) 160 MG/5ML solution 650 mg  650 mg Per Tube Q4H PRN Zierle-Ghosh, Asia B, DO       Or   acetaminophen (TYLENOL) suppository  650 mg  650 mg Rectal Q4H PRN Zierle-Ghosh, Asia B, DO       apixaban (ELIQUIS) tablet 5 mg  5 mg Oral BID Zierle-Ghosh, Asia B, DO   5 mg at 05/18/21 0151   aspirin tablet 325 mg  325 mg Oral Daily Kirby-Graham, Karsten Fells, NP       furosemide (LASIX) tablet 40 mg  40 mg Oral Daily Zierle-Ghosh, Asia B, DO       gabapentin (NEURONTIN) capsule 900 mg  900 mg Oral QHS Zierle-Ghosh, Asia B, DO   900 mg at 05/18/21 0150   HYDROcodone-acetaminophen (NORCO) 10-325 MG per tablet 1 tablet  1 tablet Oral BID Zierle-Ghosh, Asia B, DO   1 tablet at 05/18/21 0153   levothyroxine (SYNTHROID) tablet 112 mcg  112 mcg Oral Q0200 Zierle-Ghosh, Asia B, DO       metoprolol tartrate (LOPRESSOR) tablet 25 mg  25 mg Oral BID Zierle-Ghosh, Asia B, DO   25 mg at 05/18/21 0153   senna-docusate (Senokot-S) tablet 1 tablet  1 tablet Oral QHS PRN Zierle-Ghosh, Asia B, DO        REVIEW OF SYSTEMS:  [X]  denotes positive finding, [ ]  denotes negative finding Cardiac  Comments:  Chest pain or chest pressure:    Shortness of breath upon exertion: x   Short of breath when lying flat: x   Irregular heart rhythm:        Vascular    Pain in calf, thigh, or hip brought on by ambulation:    Pain in feet at night that wakes you up from your sleep:     Blood clot in your veins:    Leg swelling:         Pulmonary    Oxygen at home:    Productive cough:     Wheezing:         Neurologic    Sudden weakness in arms or legs:     Sudden numbness in arms or legs:     Sudden onset of difficulty speaking or slurred speech:    Temporary loss of vision in one eye:  x Left eye  Problems with dizziness:         Gastrointestinal    Blood in stool:     Vomited blood:         Genitourinary    Burning when urinating:     Blood in urine:        Psychiatric    Major depression:         Hematologic    Bleeding problems:  Problems with blood clotting too easily:        Skin    Rashes or ulcers:        Constitutional     Fever or chills:    -  PHYSICAL EXAM:   Vitals:   05/17/21 2148 05/17/21 2300 05/18/21 0149 05/18/21 0349  BP: 123/80 (!) 132/94 (!) 142/89 (!) 118/91  Pulse: 83 90    Resp: 18 17 16 16   Temp: 98 F (36.7 C) 98.3 F (36.8 C) 97.9 F (36.6 C) (!) 97.5 F (36.4 C)  TempSrc: Oral Oral Oral Oral  SpO2: 96% 98% 96% 94%   There is no height or weight on file to calculate BMI. GENERAL: The patient is a well-nourished male, in no acute distress. The vital signs are documented above. CARDIAC: There is a regular rate and rhythm.  VASCULAR: I do not detect carotid bruits. On the right side he has a palpable femoral, popliteal, and posterior tibial pulse. On the left side he has a palpable femoral and popliteal pulse.  I cannot palpate pedal pulses. Both feet are warm and well-perfused. He has no significant lower extremity swelling. PULMONARY: There is good air exchange bilaterally without wheezing or rales. ABDOMEN: Soft and non-tender with normal pitched bowel sounds.  I do not palpate an aneurysm although his abdomen is difficult to assess because of his size. MUSCULOSKELETAL: There are no major deformities. NEUROLOGIC: No focal weakness or paresthesias are detected. SKIN: There are no ulcers or rashes noted. PSYCHIATRIC: The patient has a normal affect.  DATA:    CT ANGIOGRAM NECK: I have reviewed the images and independently interpreted the CT angiogram of his neck.  There is a tight 70% stenosis of the distal LEFT common carotid artery adjacent to the bifurcation.  There is significant tortuosity of the internal carotid artery.  There is no significant stenosis on the right side.  The radiologist noted mild stenosis in the right cavernous and supraclinoid ICA without additional hemodynamically significant intracranial stenoses.  CT HEAD: CT of the head yesterday showed no evidence of acute intracranial abnormality.  MRI BRAIN: MRI of the brain on 05/18/2021 showed no acute  intracranial abnormality.  The patient also had a normal MRI of the orbits.   [name] [facility]

## 2021-05-18 NOTE — Evaluation (Signed)
Physical Therapy Evaluation and Discharge Patient Details Name: Wayne Ortiz MRN: [id number] DOB: 1945-07-17 Today's Date: 05/18/2021  History of Present Illness  76 y.o. male presented 05/17/21 to ED with a chief complaint of partial vision loss. CT head no acute findings.  PMH atrial fibrillation, BPH, coronary artery disease, GERD, history of gout, hypothyroidism  Clinical Impression   Patient evaluated by Physical Therapy with no further acute PT needs identified. Patient denies any current symptoms. Scored 50/56 on Berg Balance Assessment (WNL). PT is signing off. Thank you for this referral.        Recommendations for follow up therapy are one component of a multi-disciplinary discharge planning process, led by the attending physician.  Recommendations may be updated based on patient status, additional functional criteria and insurance authorization.  Follow Up Recommendations No PT follow up    Assistance Recommended at Discharge None  Patient can return home with the following       Equipment Recommendations None recommended by PT  Recommendations for Other Services       Functional Status Assessment Patient has not had a recent decline in their functional status     Precautions / Restrictions Precautions Precautions: None      Mobility  Bed Mobility Overal bed mobility: Independent                  Transfers Overall transfer level: Independent                      Ambulation/Gait Ambulation/Gait assistance: Independent Gait Distance (Feet): 300 Feet Assistive device: None Gait Pattern/deviations: Step-through pattern, Antalgic Gait velocity: WNL Gait velocity interpretation: >2.62 ft/sec, indicative of community ambulatory   General Gait Details: has had rt heel pain due to neuropathy; favors rt foot  Stairs            Wheelchair Mobility    Modified Rankin (Stroke Patients Only) Modified Rankin (Stroke Patients Only) Pre-Morbid  Rankin Score: No symptoms Modified Rankin: No symptoms     Balance Overall balance assessment: Independent                               Standardized Balance Assessment Standardized Balance Assessment : Berg Balance Test Berg Balance Test Sit to Stand: Able to stand without using hands and stabilize independently Standing Unsupported: Able to stand safely 2 minutes Sitting with Back Unsupported but Feet Supported on Floor or Stool: Able to sit safely and securely 2 minutes Stand to Sit: Sits safely with minimal use of hands Transfers: Able to transfer safely, minor use of hands Standing Unsupported with Eyes Closed: Able to stand 10 seconds safely Standing Ubsupported with Feet Together: Able to place feet together independently and stand 1 minute safely From Standing, Reach Forward with Outstretched Arm: Can reach confidently >25 cm (10") From Standing Position, Pick up Object from Floor: Able to pick up shoe safely and easily From Standing Position, Turn to Look Behind Over each Shoulder: Looks behind from both sides and weight shifts well Turn 360 Degrees: Able to turn 360 degrees safely in 4 seconds or less Standing Unsupported, Alternately Place Feet on Step/Stool: Able to complete 4 steps without aid or supervision Standing Unsupported, One Foot in Front: Able to plae foot ahead of the other independently and hold 30 seconds Standing on One Leg: Tries to lift leg/unable to hold 3 seconds but remains standing independently Total Score: 50  Pertinent Vitals/Pain Pain Assessment Pain Assessment: Faces Faces Pain Scale: Hurts little more Pain Location: rt heel due to neuropathy (per pt) Pain Descriptors / Indicators: Guarding, Aching Pain Intervention(s): Limited activity within patient's tolerance    Home Living Family/patient expects to be discharged to:: Private residence Living Arrangements: Alone Available Help at Discharge: Family Type of Home:  Mobile home Home Access: Level entry       Home Layout: One level Home Equipment: Conservation officer, nature (2 wheels);Cane - single point;BSC/3in1;Shower seat - built in;Grab bars - tub/shower Additional Comments: all information from prior medical record    Prior Function Prior Level of Function : Independent/Modified Independent;Driving             Mobility Comments: Pt able to ambulate in community without AD ADLs Comments: Pt independent with ADLS, shopping, IADLS, and driving     Hand Dominance        Extremity/Trunk Assessment   Upper Extremity Assessment Upper Extremity Assessment: Defer to OT evaluation    Lower Extremity Assessment Lower Extremity Assessment: Overall WFL for tasks assessed    Cervical / Trunk Assessment Cervical / Trunk Assessment: Normal  Communication   Communication: No difficulties  Cognition Arousal/Alertness: Awake/alert Behavior During Therapy: WFL for tasks assessed/performed Overall Cognitive Status: Within Functional Limits for tasks assessed                                          General Comments      Exercises     Assessment/Plan    PT Assessment Patient does not need any further PT services  PT Problem List         PT Treatment Interventions      PT Goals (Current goals can be found in the Care Plan section)  Acute Rehab PT Goals Patient Stated Goal: go home before Tues procedure and come back PT Goal Formulation: All assessment and education complete, DC therapy    Frequency       Co-evaluation               AM-PAC PT "6 Clicks" Mobility  Outcome Measure Help needed turning from your back to your side while in a flat bed without using bedrails?: None Help needed moving from lying on your back to sitting on the side of a flat bed without using bedrails?: None Help needed moving to and from a bed to a chair (including a wheelchair)?: None Help needed standing up from a chair using your arms  (e.g., wheelchair or bedside chair)?: None Help needed to walk in hospital room?: None Help needed climbing 3-5 steps with a railing? : None 6 Click Score: 24    End of Session   Activity Tolerance: Patient tolerated treatment well Patient left: in bed;with call bell/phone within reach;with family/visitor present Nurse Communication: Mobility status PT Visit Diagnosis: Difficulty in walking, not elsewhere classified (R26.2)    Time: 0102-7253 PT Time Calculation (min) (ACUTE ONLY): 14 min   Charges:   PT Evaluation $PT Eval Low Complexity: [place], PT Acute Rehabilitation Services  Pager [phone] Office [phone]   Rexanne Mano 05/18/2021, 11:56 AM

## 2021-05-18 NOTE — Progress Notes (Signed)
° °  Echocardiogram 2D Echocardiogram has been performed.  Beryle Beams 05/18/2021, 8:58 AM

## 2021-05-18 NOTE — Evaluation (Signed)
Speech Language Pathology Evaluation Patient Details Name: Wayne Ortiz MRN: [id number] DOB: Nov 03, 1945 Today's Date: 05/18/2021 Time: 4097-3532 SLP Time Calculation (min) (ACUTE ONLY): 15 min  Problem List:  Patient Active Problem List   Diagnosis Date Noted   TIA (transient ischemic attack) 05/17/2021   S/P total knee arthroplasty, left 03/05/2021   Acute respiratory disease due to COVID-19 virus 03/02/2019   Pneumonia due to COVID-19 virus 03/02/2019   Acute Hypoxic Respiratory failure, acute due to COVID PNA 03/02/2019   Liver abscess- s/p JP drain 03/02/2019   A-fib with RVR 03/15/2015   Essential hypertension 12/09/2014   Morbid obesity-BMI 37 12/09/2014   Hypothyroidism 12/09/2014   Dyspnea 12/09/2014   Chronic anticoagulation-Eliquis 12/09/2014   Persistent atrial fibrillation ([place]) 12/07/2014   Chest pain 09/13/2014   Palpitations 09/13/2014   CAD S/P RCA and CFX DES Feb 2016 09/13/2014   Coronary artery abnormality 06/22/2014   Presence of stent in right coronary artery 06/22/2014   Presence of coronary angioplasty implant and graft 06/22/2014   Abnormal cardiovascular function study 06/13/2014   Postoperative stiffness of total knee replacement ([place]) 06/21/2013   Pain in right knee 06/21/2013   Difficulty in walking(719.7) 06/21/2013   S/P right TKA 05/30/2013   Abdominal pain, right upper quadrant 12/31/2012   Abnormal transaminases 12/31/2012   Thrombocytopenia, unspecified ([place]) 12/31/2012   Past Medical History:  Past Medical History:  Diagnosis Date   Arthritis    Atrial fibrillation (HCC)    BPH (benign prostatic hypertrophy)    CAD (coronary artery disease)    a. s/p DES 05/2014 at [facility])    Complication of anesthesia    BECAME COMBATIVE   Dyspnea    Dysrhythmia    Femur fracture (HCC)    GERD (gastroesophageal reflux disease)    occasional TUMS   Gout    History of gout    History of kidney stones    Hypertension     Hypothyroidism    Persistent atrial fibrillation (HCC)    Precancerous skin lesion    Renal disorder    Past Surgical History:  Past Surgical History:  Procedure Laterality Date   arm fracture surgery Left    CARDIOVERSION [name] 12/09/2014   Procedure: CARDIOVERSION;  Surgeon: [name], MD;  Location: [place];  Service: Cardiovascular;  Laterality: [name];   CARDIOVERSION [name] 05/05/2016   Procedure: CARDIOVERSION;  Surgeon: [name], MD;  Location: [place];  Service: Cardiovascular;  Laterality: [name];   CARDIOVERSION [name] 08/22/2016   Procedure: CARDIOVERSION;  Surgeon: Lelon Perla, MD;  Location: [facility] ENDOSCOPY;  Service: Cardiovascular;  Laterality: [name];   CATARACTS     EXCISION   CERVICAL SPINE SURGERY     CHOLECYSTECTOMY [name] 01/03/2013   Procedure: LAPAROSCOPIC CHOLECYSTECTOMY;  Surgeon: [name], MD;  Location: [name];  Service: General;  Laterality: [name];   ELECTROPHYSIOLOGIC STUDY [name] 03/15/2015   Procedure: Atrial Fibrillation Ablation;  Surgeon: [name], MD;  Location: [place] CV LAB;  Service: Cardiovascular;  Laterality: [name];   ELECTROPHYSIOLOGIC STUDY [name] 04/01/2016   Procedure: Atrial Fibrillation Ablation;  Surgeon: [name], MD;  Location: [place] CV LAB;  Service: Cardiovascular;  Laterality: [name];   ERCP [name] 01/02/2013   Procedure: ENDOSCOPIC RETROGRADE CHOLANGIOPANCREATOGRAPHY (ERCP);  Surgeon: [name], MD;  Location: [name];  Service: Endoscopy;  Laterality: [name];   FEMUR FRACTURE SURGERY     LIVER BIOPSY [name] 01/03/2013   Procedure: LIVER BIOPSY;  Surgeon: [name], MD;  Location: [name];  Service: General;  Laterality: [name];   RIGHT/LEFT HEART CATH AND CORONARY ANGIOGRAPHY [name] 07/04/2016   Procedure: Right/Left Heart Cath and Coronary Angiography;  Surgeon: Belva Crome, MD;  Location: [place] CV LAB;  Service: Cardiovascular;  Laterality: [name];   SPHINCTEROTOMY [name] 01/02/2013   Procedure: SPHINCTEROTOMY;  Surgeon: [name],  MD;  Location: [name];  Service: Endoscopy;  Laterality: [name];  Stone Extraction   TEE WITHOUT CARDIOVERSION [name] 08/22/2016   Procedure: TRANSESOPHAGEAL ECHOCARDIOGRAM (TEE);  Surgeon: Lelon Perla, MD;  Location: [place];  Service: Cardiovascular;  Laterality: [name];   TOTAL KNEE ARTHROPLASTY Right 05/30/2013   Procedure: RIGHT TOTAL KNEE ARTHROPLASTY;  Surgeon: [name], MD;  Location: [name];  Service: Orthopedics;  Laterality: Right;   TOTAL KNEE ARTHROPLASTY Left 03/05/2021   Procedure: TOTAL KNEE ARTHROPLASTY;  Surgeon: Paralee Cancel, MD;  Location: [name];  Service: Orthopedics;  Laterality: Left;   HPI:  76 year old male admitted for w/u of possible TIA, 3 episodes of partial vision loss in the past 3 months.   Assessment / Plan / Recommendation Clinical Impression  Patient presents with normal cognitive-linguistic function. No f/u SLP services indicated at this time.    SLP Assessment  SLP Recommendation/Assessment: Patient does not need any further Speech [place] Pathology Services SLP Visit Diagnosis: Cognitive communication deficit (R41.841)    Recommendations for follow up therapy are one component of a multi-disciplinary discharge planning process, led by the attending physician.  Recommendations may be updated based on patient status, additional functional criteria and insurance authorization.    Follow Up Recommendations  No SLP follow up    Assistance Recommended at Discharge  None           SLP Evaluation Cognition  Overall Cognitive Status: Within Functional Limits for tasks assessed       Comprehension  Auditory Comprehension Overall Auditory Comprehension: Appears within functional limits for tasks assessed Visual Recognition/Discrimination Discrimination: Within Function Limits Reading Comprehension Reading Status: Not tested    Expression Expression Primary Mode of Expression: Verbal Verbal Expression Overall Verbal Expression: Appears  within functional limits for tasks assessed   Oral / Motor  Oral Motor/Sensory Function Overall Oral Motor/Sensory Function: Within functional limits Motor Speech Overall Motor Speech: Appears within functional limits for tasks assessed           Gabriel Rainwater MA, CCC-SLP  Denora Wysocki Meryl 05/18/2021, 8:32 AM

## 2021-05-18 NOTE — Evaluation (Signed)
Occupational Therapy Evaluation Patient Details Name: Wayne Ortiz MRN: [id number] DOB: [birth year] Today's Date: 05/18/2021   History of Present Illness 76 y.o. male presented 05/17/21 to ED with a chief complaint of partial vision loss. CT head no acute findings.  PMH atrial fibrillation, BPH, coronary artery disease, GERD, history of gout, hypothyroidism   Clinical Impression   Pt admitted for concerns listed above. PTA pt reported that he was independent with all ADL's and IADL's, including driving. At this time, pt continues to demonstrate good safety awareness, balance, and strength. He continues to be independent with all ADL's and functional mobility. Pt has no further OT needs and acute OT will sign off.       Recommendations for follow up therapy are one component of a multi-disciplinary discharge planning process, led by the attending physician.  Recommendations may be updated based on patient status, additional functional criteria and insurance authorization.   Follow Up Recommendations  No OT follow up    Assistance Recommended at Discharge None  Patient can return home with the following      Functional Status Assessment  Patient has had a recent decline in their functional status and demonstrates the ability to make significant improvements in function in a reasonable and predictable amount of time.  Equipment Recommendations  None recommended by OT    Recommendations for Other Services       Precautions / Restrictions Precautions Precautions: None Restrictions Weight Bearing Restrictions: No      Mobility Bed Mobility Overal bed mobility: Independent                  Transfers Overall transfer level: Independent                        Balance Overall balance assessment: Independent                                         ADL either performed or assessed with clinical judgement   ADL Overall ADL's : Modified  independent;At baseline                                             Vision Baseline Vision/History: 1 Wears glasses Ability to See in Adequate Light: 0 Adequate Patient Visual Report: No change from baseline Vision Assessment?: No apparent visual deficits     Perception     Praxis      Pertinent Vitals/Pain Pain Assessment Pain Assessment: Faces Faces Pain Scale: Hurts little more Pain Location: rt heel due to neuropathy (per pt) Pain Descriptors / Indicators: Guarding, Aching Pain Intervention(s): Monitored during session     Hand Dominance Right   Extremity/Trunk Assessment Upper Extremity Assessment Upper Extremity Assessment: Overall WFL for tasks assessed   Lower Extremity Assessment Lower Extremity Assessment: Defer to PT evaluation   Cervical / Trunk Assessment Cervical / Trunk Assessment: Normal   Communication Communication Communication: No difficulties   Cognition Arousal/Alertness: Awake/alert Behavior During Therapy: WFL for tasks assessed/performed Overall Cognitive Status: Within Functional Limits for tasks assessed  General Comments  VSS on RA    Exercises     Shoulder Instructions      Home Living Family/patient expects to be discharged to:: Private residence Living Arrangements: Alone Available Help at Discharge: Family Type of Home: Mobile home Home Access: Level entry     Home Layout: One level     Bathroom Shower/Tub: Hospital doctor Toilet: Handicapped height     Home Equipment: Conservation officer, nature (2 wheels);Cane - single point;BSC/3in1;Shower seat - built in;Grab bars - tub/shower   Additional Comments: all information from prior medical record      Prior Functioning/Environment Prior Level of Function : Independent/Modified Independent;Driving             Mobility Comments: Pt able to ambulate in community without AD ADLs Comments: Pt  independent with ADLS, shopping, IADLS, and driving        OT Problem List: Decreased activity tolerance;Impaired balance (sitting and/or standing)      OT Treatment/Interventions:      OT Goals(Current goals can be found in the care plan section) Acute Rehab OT Goals Patient Stated Goal: To go home OT Goal Formulation: With patient Time For Goal Achievement: 05/18/21 Potential to Achieve Goals: Good  OT Frequency:      Co-evaluation              AM-PAC OT "6 Clicks" Daily Activity     Outcome Measure Help from another person eating meals?: None Help from another person taking care of personal grooming?: None Help from another person toileting, which includes using toliet, bedpan, or urinal?: None Help from another person bathing (including washing, rinsing, drying)?: None Help from another person to put on and taking off regular upper body clothing?: None Help from another person to put on and taking off regular lower body clothing?: None 6 Click Score: 24   End of Session Nurse Communication: Mobility status  Activity Tolerance: Patient tolerated treatment well Patient left: in bed;with call bell/phone within reach  OT Visit Diagnosis: Other abnormalities of gait and mobility (R26.89);Muscle weakness (generalized) (M62.81)                Time: 5465-6812 OT Time Calculation (min): 19 min Charges:  OT General Charges $OT Visit: 1 Visit OT Evaluation $OT Eval Moderate Complexity: 1 Mod  Ieisha Gao H., OTR/L Acute Rehabilitation  Daci Stubbe Elane Yolanda Bonine 05/18/2021, 5:31 PM

## 2021-05-18 NOTE — Plan of Care (Signed)
Pt arrived via stretcher by carelink. Vitals obtained and pt assessed, see flowsheets for details. Pt oriented to room.   Problem: Education: Goal: Knowledge of disease or condition will improve Outcome: Progressing Goal: Knowledge of secondary prevention will improve (SELECT ALL) Outcome: Progressing Goal: Knowledge of patient specific risk factors will improve (INDIVIDUALIZE FOR PATIENT) Outcome: Progressing Goal: Individualized Educational Video(s) Outcome: Progressing   Problem: Self-Care: Goal: Ability to participate in self-care as condition permits will improve Outcome: Progressing

## 2021-05-19 DIAGNOSIS — G459 Transient cerebral ischemic attack, unspecified: Secondary | ICD-10-CM | POA: Diagnosis not present

## 2021-05-19 DIAGNOSIS — G453 Amaurosis fugax: Secondary | ICD-10-CM

## 2021-05-19 DIAGNOSIS — I6522 Occlusion and stenosis of left carotid artery: Secondary | ICD-10-CM | POA: Diagnosis not present

## 2021-05-19 DIAGNOSIS — E7800 Pure hypercholesterolemia, unspecified: Secondary | ICD-10-CM | POA: Diagnosis not present

## 2021-05-19 DIAGNOSIS — I4819 Other persistent atrial fibrillation: Secondary | ICD-10-CM

## 2021-05-19 DIAGNOSIS — I10 Essential (primary) hypertension: Secondary | ICD-10-CM

## 2021-05-19 LAB — CBC WITH DIFFERENTIAL/PLATELET
Abs Immature Granulocytes: 0 10*3/uL (ref 0.00–0.07)
Basophils Absolute: 0 10*3/uL (ref 0.0–0.1)
Basophils Relative: 1 %
Eosinophils Absolute: 0 10*3/uL (ref 0.0–0.5)
Eosinophils Relative: 0 %
HCT: 46.8 % (ref 39.0–52.0)
Hemoglobin: 15.8 g/dL (ref 13.0–17.0)
Immature Granulocytes: 0 %
Lymphocytes Relative: 38 %
Lymphs Abs: 1.6 10*3/uL (ref 0.7–4.0)
MCH: 33.6 pg (ref 26.0–34.0)
MCHC: 33.8 g/dL (ref 30.0–36.0)
MCV: 99.6 fL (ref 80.0–100.0)
Monocytes Absolute: 0.4 10*3/uL (ref 0.1–1.0)
Monocytes Relative: 9 %
Neutro Abs: 2.3 10*3/uL (ref 1.7–7.7)
Neutrophils Relative %: 52 %
Platelets: 123 10*3/uL — ABNORMAL LOW (ref 150–400)
RBC: 4.7 MIL/uL (ref 4.22–5.81)
RDW: 13.2 % (ref 11.5–15.5)
WBC: 4.3 10*3/uL (ref 4.0–10.5)
nRBC: 0 % (ref 0.0–0.2)

## 2021-05-19 LAB — COMPREHENSIVE METABOLIC PANEL
ALT: 17 U/L (ref 0–44)
AST: 17 U/L (ref 15–41)
Albumin: 3.9 g/dL (ref 3.5–5.0)
Alkaline Phosphatase: 54 U/L (ref 38–126)
Anion gap: 10 (ref 5–15)
BUN: 20 mg/dL (ref 8–23)
CO2: 24 mmol/L (ref 22–32)
Calcium: 9.1 mg/dL (ref 8.9–10.3)
Chloride: 107 mmol/L (ref 98–111)
Creatinine, Ser: 1.13 mg/dL (ref 0.61–1.24)
GFR, Estimated: >60 mL/min (ref >60)
Glucose, Bld: 107 mg/dL — ABNORMAL HIGH (ref 70–99)
Potassium: 3.8 mmol/L (ref 3.5–5.1)
Sodium: 141 mmol/L (ref 135–145)
Total Bilirubin: 0.8 mg/dL (ref 0.3–1.2)
Total Protein: 6.6 g/dL (ref 6.5–8.1)

## 2021-05-19 LAB — APTT
aPTT: 75 seconds — ABNORMAL HIGH (ref 24–36)
aPTT: 116 seconds — ABNORMAL HIGH (ref 24–36)
aPTT: 105 seconds — ABNORMAL HIGH (ref 24–36)

## 2021-05-19 LAB — C-REACTIVE PROTEIN: CRP: 0.8 mg/dL (ref <1.0)

## 2021-05-19 LAB — TYPE AND SCREEN
ABO/RH(D): A POS
Antibody Screen: NEGATIVE

## 2021-05-19 LAB — MAGNESIUM: Magnesium: 2 mg/dL (ref 1.7–2.4)

## 2021-05-19 LAB — SEDIMENTATION RATE: Sed Rate: 1 mm/hr (ref 0–16)

## 2021-05-19 LAB — BRAIN NATRIURETIC PEPTIDE: B Natriuretic Peptide: 52.5 pg/mL (ref 0.0–100.0)

## 2021-05-19 LAB — HEPARIN LEVEL (UNFRACTIONATED): Heparin Unfractionated: >1.1 IU/mL — ABNORMAL HIGH (ref 0.30–0.70)

## 2021-05-19 MED ORDER — LIDOCAINE 5 % EX PTCH
1.0000 | MEDICATED_PATCH | Freq: Every day | CUTANEOUS | Status: DC
  Administered 2021-05-19 – 2021-05-22 (×4): 1 via TRANSDERMAL
  Filled 2021-05-19 (×5): qty 1

## 2021-05-19 MED ORDER — ASPIRIN EC 81 MG PO TBEC
81.0000 mg | DELAYED_RELEASE_TABLET | Freq: Every day | ORAL | Status: DC
  Administered 2021-05-19 – 2021-05-23 (×4): 81 mg via ORAL
  Filled 2021-05-19 (×4): qty 1

## 2021-05-19 NOTE — Consult Note (Signed)
[place] for apixaban > heparin Indication: atrial fibrillation  Allergies  Allergen Reactions   Amiodarone Shortness Of Breath   Atorvastatin Other (See Comments)    Arthralgias   Dilaudid [Hydromorphone] Other (See Comments)    Shaking all over (chills/Malaise)   Hydrocodone Hives   Lisinopril Other (See Comments)    CP and SOB   Metoprolol Other (See Comments)    CP and SOb   Rosuvastatin Other (See Comments)    Myalgias   Diltiazem Rash   Rocephin [Ceftriaxone Sodium In Dextrose] Rash    Patient Measurements: Weight: 111.6 kg (246 lb) (bed weight) Heparin Dosing Weight: 101.38  Vital Signs: Temp: 98.2 F (36.8 C) (01/22 1100) Temp Source: Oral (01/22 1100) BP: 104/92 (01/22 1100) Pulse Rate: 89 (01/22 1100)  Labs: Recent Labs    05/17/21 1711 05/18/21 0059 05/19/21 0136 05/19/21 1028  HGB 16.7 16.0 15.8  --   HCT 49.4 48.0 46.8  --   PLT 145* 129* 123*  --   APTT 33  --  75* 116*  LABPROT 13.8  --   --   --   INR 1.1  --   --   --   HEPARINUNFRC  --   --  >1.10*  --   CREATININE 1.15 1.10 1.13  --      Estimated Creatinine Clearance: 71.7 mL/min (by C-G formula based on SCr of 1.13 mg/dL).   Medical History: Past Medical History:  Diagnosis Date   Arthritis    Atrial fibrillation (HCC)    BPH (benign prostatic hypertrophy)    CAD (coronary artery disease)    a. s/p DES 05/2014 at [place] [facility])    Complication of anesthesia    BECAME COMBATIVE   Dyspnea    Dysrhythmia    Femur fracture (HCC)    GERD (gastroesophageal reflux disease)    occasional TUMS   Gout    History of gout    History of kidney stones    Hypertension    Hypothyroidism    Persistent atrial fibrillation (HCC)    Precancerous skin lesion    Renal disorder     Medications:  Scheduled:   aspirin EC  81 mg Oral Daily   ezetimibe  10 mg Oral Daily   furosemide  40 mg Oral Daily   gabapentin  900 mg Oral QHS    levothyroxine  112 mcg Oral Q0200   lidocaine  1 patch Transdermal QHS   metoprolol tartrate  25 mg Oral BID   niacin  100 mg Oral BID WC   pantoprazole  40 mg Oral Daily   Infusions:   heparin 1,500 Units/hr (05/18/21 2232)    Assessment: Pt came to hospital complaining of episodes of partial vision loss in left eye. TIA/vascular workup shows 80% left carotid stenosis. Pt on eliquis PTA for afib and last dose was given at 0905 1/21. Vascular d'ced eliquis and pharmacy has been consulted to dose heparin.  Currently on IV heparin at 1500 units/hr. Second aPTT is supratherapeutic at 116. HL is not correlating due to Eliquis effect. H/H wnl. Plt low stable since yesterday. RN reports no s/sx of bleeding.   Goal of Therapy:  Heparin level 0.3-0.7 units/ml aPPT 66-102 Monitor platelets by anticoagulation protocol: Yes   Plan:  Decrease  heparin infusion to 1300 units/hr  F/u 8 hr aPTT Daily heparin level, aPTT, and CBC Monitor for s/sx of bleeding  Lovena [place]. [place], Student  Pharmacist  Please refer to [facility] for unit-specific pharmacist

## 2021-05-19 NOTE — Consult Note (Signed)
[place] for apixaban > heparin Indication: atrial fibrillation  Allergies  Allergen Reactions   Amiodarone Shortness Of Breath   Atorvastatin Other (See Comments)    Arthralgias   Dilaudid [Hydromorphone] Other (See Comments)    Shaking all over (chills/Malaise)   Hydrocodone Hives   Lisinopril Other (See Comments)    CP and SOB   Metoprolol Other (See Comments)    CP and SOb   Rosuvastatin Other (See Comments)    Myalgias   Diltiazem Rash   Rocephin [Ceftriaxone Sodium In Dextrose] Rash    Patient Measurements: Weight: 111.6 kg (246 lb) (bed weight) Heparin Dosing Weight: 101.38  Vital Signs: Temp: 97.8 F (36.6 C) (01/22 0000) Temp Source: Oral (01/22 0000) BP: 132/101 (01/22 0000) Pulse Rate: 89 (01/22 0000)  Labs: Recent Labs    05/17/21 1711 05/18/21 0059 05/19/21 0136  HGB 16.7 16.0 15.8  HCT 49.4 48.0 46.8  PLT 145* 129* 123*  APTT 33  --  75*  LABPROT 13.8  --   --   INR 1.1  --   --   HEPARINUNFRC  --   --  >1.10*  CREATININE 1.15 1.10 1.13     Estimated Creatinine Clearance: 71.7 mL/min (by C-G formula based on SCr of 1.13 mg/dL).   Medical History: Past Medical History:  Diagnosis Date   Arthritis    Atrial fibrillation (HCC)    BPH (benign prostatic hypertrophy)    CAD (coronary artery disease)    a. s/p DES 05/2014 at [facility])    Complication of anesthesia    BECAME COMBATIVE   Dyspnea    Dysrhythmia    Femur fracture (HCC)    GERD (gastroesophageal reflux disease)    occasional TUMS   Gout    History of gout    History of kidney stones    Hypertension    Hypothyroidism    Persistent atrial fibrillation (HCC)    Precancerous skin lesion    Renal disorder     Medications:  Scheduled:   aspirin EC  81 mg Oral Daily   ezetimibe  10 mg Oral Daily   furosemide  40 mg Oral Daily   gabapentin  900 mg Oral QHS   levothyroxine  112 mcg Oral Q0200   lidocaine  1 patch  Transdermal QHS   metoprolol tartrate  25 mg Oral BID   niacin  100 mg Oral BID WC   pantoprazole  40 mg Oral Daily   Infusions:   heparin 1,500 Units/hr (05/18/21 2232)    Assessment: Pt came to hospital complaining of episodes of partial vision loss in left eye. TIA/vascular workup shows 80% left carotid stenosis. Pt on eliquis PTA for afib and last dose was given at 0905 1/21. Vascular d'ced eliquis and pharmacy has been consulted to dose heparin.  Currently on IV heparin at 1500 units/hr. Initial aPTT is therapeutic. HL is not correlating due to Eliquis effect. H/H wnl. Plt low stable since yesterday   Goal of Therapy:  Heparin level 0.3-0.7 units/ml aPPT 66-102 Monitor platelets by anticoagulation protocol: Yes   Plan:  Continue heparin infusion at 1500 units/hr  F/u 8 hr aPTT Daily heparin level, aPTT, and CBC Monitor for s/sx of bleeding  Albertina Parr, PharmD., BCPS, BCCCP Clinical Pharmacist Please refer to [facility] for unit-specific pharmacist

## 2021-05-19 NOTE — Progress Notes (Signed)
° °  VASCULAR SURGERY ASSESSMENT & PLAN:   SYMPTOMATIC LEFT CAROTID STENOSIS: Although his duplex scan does not show significant stenosis on the left he clearly has a bulky calcified plaque in the distal common carotid artery which is most likely the source of his repeated episodes of amaurosis fugax.  His Eliquis is on hold and he is scheduled for left carotid endarterectomy on Tuesday.  ANTICOAGULATION: His apixaban is on hold and he is now on IV heparin.   CARDIAC: His echo this admission shows an ejection fraction of 55%.  LV function is normal.  SUBJECTIVE:   No specific complaints this morning.  PHYSICAL EXAM:   Vitals:   05/18/21 2213 05/19/21 0000 05/19/21 0805 05/19/21 0809  BP: 133/89 (!) 132/101 (!) 127/107 115/89  Pulse:  89 74   Resp: 18 16 18    Temp: 97.9 F (36.6 C) 97.8 F (36.6 C) 98 F (36.7 C)   TempSrc: Oral Oral Axillary   SpO2: 93% 90% 90%   Weight:       No focal weakness or paresthesias.  LABS:   CAROTID DUPLEX: I have independently interpreted his carotid duplex scan.    On the left side, which is the side of concern there is a calcific plaque in the distal common carotid artery.  Peak systolic velocity 78 cm/s with an end-diastolic velocity of 23 cm/s suggesting a less than 50% stenosis.  However because of acoustic shadowing those velocities may not be accurate.  There is no significant disease noted in the internal carotid artery.   On the right side, there is no significant carotid disease.  The right vertebral artery is patent with antegrade flow.  Lab Results  Component Value Date   WBC 4.3 05/19/2021   HGB 15.8 05/19/2021   HCT 46.8 05/19/2021   MCV 99.6 05/19/2021   PLT 123 (L) 05/19/2021   Lab Results  Component Value Date   CREATININE 1.13 05/19/2021   Lab Results  Component Value Date   INR 1.1 05/17/2021    PROBLEM LIST:    Principal Problem:   TIA (transient ischemic attack) Active Problems:   Persistent atrial  fibrillation (HCC)   Essential hypertension   Hypothyroidism   CURRENT MEDS:    aspirin EC  81 mg Oral Daily   ezetimibe  10 mg Oral Daily   furosemide  40 mg Oral Daily   gabapentin  900 mg Oral QHS   levothyroxine  112 mcg Oral Q0200   lidocaine  1 patch Transdermal QHS   metoprolol tartrate  25 mg Oral BID   niacin  100 mg Oral BID WC   pantoprazole  40 mg Oral Daily    [name] Office: [phone] 05/19/2021

## 2021-05-19 NOTE — Consult Note (Addendum)
[place] for apixaban > heparin Indication: atrial fibrillation  Allergies  Allergen Reactions   Amiodarone Shortness Of Breath   Atorvastatin Other (See Comments)    Arthralgias   Dilaudid [Hydromorphone] Other (See Comments)    Shaking all over (chills/Malaise)   Hydrocodone Hives   Lisinopril Other (See Comments)    CP and SOB   Metoprolol Other (See Comments)    CP and SOb   Rosuvastatin Other (See Comments)    Myalgias   Diltiazem Rash   Rocephin [Ceftriaxone Sodium In Dextrose] Rash    Patient Measurements: Weight: 111.6 kg (246 lb) (bed weight) Heparin Dosing Weight: 101.38  Vital Signs: Temp: 98 F (36.7 C) (01/22 1937) Temp Source: Oral (01/22 1937) BP: 115/86 (01/22 1937) Pulse Rate: 82 (01/22 1937)  Labs: Recent Labs    05/17/21 1711 05/18/21 0059 05/19/21 0136 05/19/21 1028 05/19/21 1942  HGB 16.7 16.0 15.8  --   --   HCT 49.4 48.0 46.8  --   --   PLT 145* 129* 123*  --   --   APTT 33  --  75* 116* 105*  LABPROT 13.8  --   --   --   --   INR 1.1  --   --   --   --   HEPARINUNFRC  --   --  >1.10*  --   --   CREATININE 1.15 1.10 1.13  --   --      Estimated Creatinine Clearance: 71.7 mL/min (by C-G formula based on SCr of 1.13 mg/dL).   Medical History: Past Medical History:  Diagnosis Date   Arthritis    Atrial fibrillation (HCC)    BPH (benign prostatic hypertrophy)    CAD (coronary artery disease)    a. s/p DES 05/2014 at [facility])    Complication of anesthesia    BECAME COMBATIVE   Dyspnea    Dysrhythmia    Femur fracture (HCC)    GERD (gastroesophageal reflux disease)    occasional TUMS   Gout    History of gout    History of kidney stones    Hypertension    Hypothyroidism    Persistent atrial fibrillation (HCC)    Precancerous skin lesion    Renal disorder     Medications:  Scheduled:   aspirin EC  81 mg Oral Daily   ezetimibe  10 mg Oral Daily   furosemide  40 mg Oral  Daily   gabapentin  900 mg Oral QHS   levothyroxine  112 mcg Oral Q0200   lidocaine  1 patch Transdermal QHS   metoprolol tartrate  25 mg Oral BID   niacin  100 mg Oral BID WC   pantoprazole  40 mg Oral Daily   Infusions:   heparin 1,300 Units/hr (05/19/21 1532)    Assessment: Pt came to hospital complaining of episodes of partial vision loss in left eye. TIA/vascular workup shows 80% left carotid stenosis. Pt on eliquis PTA for afib and last dose was given at 0905 1/21. Vascular d'ced eliquis and pharmacy has been consulted to dose heparin.  Currently on IV heparin at 1300 units/hr and aptt= 105 Goal of Therapy:  Heparin level 0.3-0.7 units/ml aPPT 66-102 Monitor platelets by anticoagulation protocol: Yes   Plan:  Decrease heparin infusion to 1200 units/hr  Daily heparin level, aPTT, and CBC  Hildred Laser, PharmD Clinical Pharmacist **Pharmacist phone directory can now be found on [email or web address] ([email or web address]).  Listed under [place].

## 2021-05-19 NOTE — Progress Notes (Addendum)
STROKE TEAM PROGRESS NOTE  ° °INTERVAL HISTORY °Patient is seen in his room with no family at the bedside.  He was admitted after having three episodes of amaurosis fugax, one three months ago and two in the past two days.  He reports that during these episodes, a shadow or curtain came across his field of vision.  His ophthalmologist sent him to the ED.  Patient has 70% stenosis of the left carotid artery, and this is likely the cause of his symptoms.  Plan is for him to undergo CEA on Tuesday, after his Eliquis has washed out. ° °Vitals:  ° 05/19/21 0000 05/19/21 0805 05/19/21 0809 05/19/21 1100  °BP: (!) 132/101 (!) 127/107 115/89 (!) 104/92  °Pulse: 89 74  89  °Resp: 16 18  17  °Temp: 97.8 °F (36.6 °C) 98 °F (36.7 °C)  98.2 °F (36.8 °C)  °TempSrc: Oral Axillary  Oral  °SpO2: 90% 90%  96%  °Weight:      ° °CBC:  °Recent Labs  °Lab 05/17/21 °1711 05/18/21 °0059 05/19/21 °0136  °WBC 5.3 5.4 4.3  °NEUTROABS 3.2  --  2.3  °HGB 16.7 16.0 15.8  °HCT 49.4 48.0 46.8  °MCV 103.6* 101.1* 99.6  °PLT 145* 129* 123*  ° °Basic Metabolic Panel:  °Recent Labs  °Lab 05/18/21 °0059 05/19/21 °0136  °NA 140 141  °K 4.2 3.8  °CL 105 107  °CO2 27 24  °GLUCOSE 102* 107*  °BUN 18 20  °CREATININE 1.10 1.13  °CALCIUM 9.3 9.1  °MG  --  2.0  ° °Lipid Panel:  °Recent Labs  °Lab 05/18/21 °0059  °CHOL 192  °TRIG 105  °HDL 46  °CHOLHDL 4.2  °VLDL 21  °LDLCALC 125*  ° °HgbA1c: No results for input(s): HGBA1C in the last 168 hours. °Urine Drug Screen:  °Recent Labs  °Lab 05/17/21 °2113  °LABOPIA POSITIVE*  °COCAINSCRNUR NONE DETECTED  °LABBENZ NONE DETECTED  °AMPHETMU NONE DETECTED  °THCU NONE DETECTED  °LABBARB NONE DETECTED  °  °Alcohol Level  °Recent Labs  °Lab 05/17/21 °1711  °ETH <10  ° ° °IMAGING past 24 hours °No results found. ° °PHYSICAL EXAM °General:  Alert, well-developed, well nourished patient in no acute distress ° °Respiratory:  Patient c/o intermittent shortness of breath.  Lungs CTA, Spo2 in 90s on room  air ° °ASSESSMENT/PLAN °[name] is a 76 y.o. male with history of PAF on Eliquis, CAD s/p DES, GERD, BPH, gout and hypothyroidism presenting with three episodes of amaurosis fugax, one three months ago and two in the two days before admission.  He reports that during these episodes, a shadow or curtain came across his field of vision.  His ophthalmologist sent him to the ED.  Patient has 70% stenosis of the left carotid artery, and this is likely the cause of his symptoms.  Plan is for him to undergo CEA on Tuesday, after his Eliquis has washed out. ° °Left amaurosis fugax due to left ICA high-grade stenosis °CT head No acute abnormality. Small vessel disease. Atrophy.  °CTA head & neck 70% stenosis of distal left common carotid artery °MRI  no acute abnormality in brain or orbits.  Mild to moderate signal changes in cerebral white matter dur to small vessel disease °Carotid Doppler  1-39% stenosis in bilateral carotid arteries °2D Echo EF 55%, grade 1 diastolic dysfunction, no atrial level shunt. °LDL 125 °HgbA1c pending °VTE prophylaxis - fully anticoagulated with heparin °Eliquis (apixaban) daily prior to admission, now on heparin   IV in bridge for incoming procedure. Continue ASA 81.  °Therapy recommendations:  none °Disposition:  pending ° °PAF °On Eliquis PTA °Now on heparin IV bridge for left CEA Tuesday °Rate controlled ° °Hypertension °Home meds:  metoprolol 25 mg BID °Stable °Keep SBP 130-160 prior to left CEA °Long-term BP goal normotensive after CEA procedure ° °Hyperlipidemia °Home meds:  none °LDL 125, goal < 70 °Add zetia 10 mg daily  °Previous statin intolerance ° °Other Stroke Risk Factors °Advanced Age >/= 65  °Former cigarette smoker °Obesity, Body mass index is 33.36 kg/m²., BMI >/= 30 associated with increased stroke risk, recommend weight loss, diet and exercise as appropriate  ° °Other Active Problems °Hypothyroidism °Continue home levothyroxine ° °Hospital day # 1 ° °Cortney E De La  Torre , MSN, AGACNP-BC °[facility] °See Amion for schedule and pager information °05/19/2021 1:00 PM °  °ATTENDING NOTE: °I reviewed above note and agree with the assessment and plan. Pt was seen and examined.  ° °76-year-old male with history of PAF on Eliquis, CAD status post stenting 2016, gout, BPH admitted for 3 episode of left eye transient vision loss.  CT no acute abnormality.  CT head and neck left CCA/ICA bulb 70% stenosis.  MRI negative for acute infarct.  Carotid Doppler unremarkable.  EF 55%.  ESR and CRP negative.  LDL 125, A1c pending.  Creatinine 1.10. ° °On exam, patient neurologically intact, no vision loss, visual field intact.  Patient now at his baseline.  Vascular surgery on board, planning for left CEA on Tuesday.  Currently on heparin IV in preparation for the procedure.  Also on aspirin 81, continue.  Statin intolerance, now on Zetia.  BP goal 130-160 prior to carotid revascularization, normotensive post carotid revascularization.  PT/OT no recommendation.  We will follow. ° °For detailed assessment and plan, please refer to above as I have made changes wherever appropriate.  ° °Jindong Xu, MD PhD °Stroke Neurology °05/19/2021 °5:17 PM ° ° ° °To contact Stroke Continuity provider, please refer to [email or web address]. °After hours, contact General Neurology  °

## 2021-05-19 NOTE — Progress Notes (Signed)
PROGRESS NOTE                                                                                                                                                                                                             Patient Demographics:    Wayne Ortiz, is a 76 y.o. male, DOB - [birth year], FUX:[id number]  Outpatient Primary MD for the patient is Celene Squibb, MD    LOS - 1  Admit date - 05/17/2021    No chief complaint on file.      Brief Narrative (HPI from H&P)  -  Wayne Ortiz  is a 75 y.o. male, with history of atrial fibrillation, BPH, coronary artery disease, GERD, history of gout, hypothyroidism, and more presents ED with a chief complaint of partial vision loss.  According to the patient over the last several weeks he has had partial vision loss which is transient in his left eye, he saw an ophthalmologist and he was asked to come to the ER.  He presented to [facility], ER and was transferred to [facility] for further neurological work-up.  Initial work-up is positive for left ICA 70% lesion which likely is the cause of his vision symptoms.   Subjective:   Patient in bed, appears comfortable, denies any headache, no fever, no chest pain or pressure, no shortness of breath , no abdominal pain. No new focal weakness.   Assessment  & Plan :    No notes have been filed under this hospital service. 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  Patient to be seen by neurology and vascular surgery currently placed on aspirin, niacin due to statin allergy, heparin drip, likely undergoing left carotid endarterectomy on 05/21/20 on VVS.  Will monitor closely.  Thankfully he does not have a stroke on MRI.  We will continue to monitor.  2.  Dyslipidemia.  Has listed multiple statin allergies.  Placed on Niacin + Zetia.  3.  Hypothyroidism.  On Synthroid.  4.   Hypertension.  Continue beta-blocker at home dose.  5.  Chronic pain.  Supportive care.  6.  Paroxysmal atrial fibrillation [place] vas 2 score of greater than 3.  On beta-blocker.  Eliquis has been transitioned to heparin for upcoming surgery.  Condition - Extremely Guarded  Family Communication  :  None present  Code Status :  Full  Consults  :  Neuro, VVS  PUD Prophylaxis :    Procedures  :     MR brain orbits - 1. Normal MRI appearance of the orbits. 2. No acute intracranial abnormality. Mild to moderate for age signal changes in the cerebral white matter and basal ganglia most commonly due to chronic small vessel disease.   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  CTA - 1. No intracranial large vessel occlusion. Mild stenosis in the right cavernous and supraclinoid ICA without additional hemodynamically significant intracranial stenosis. 2. Approximately 70% stenosis in the distal left common carotid artery and at the bifurcation, without significant stenosis in the proximal left ICA. No other hemodynamically significant stenosis in the neck.      Disposition Plan  :    Status is: inpt  DVT Prophylaxis  :  Hep gtt    Lab Results  Component Value Date   PLT 123 (L) 05/19/2021    Diet :  Diet Order             Diet Heart Room service appropriate? Yes; Fluid consistency: Thin  Diet effective now                    Inpatient Medications  Scheduled Meds:  aspirin EC  81 mg Oral Daily   ezetimibe  10 mg Oral Daily   furosemide  40 mg Oral Daily   gabapentin  900 mg Oral QHS   levothyroxine  112 mcg Oral Q0200   lidocaine  1 patch Transdermal QHS   metoprolol tartrate  25 mg Oral BID   niacin  100 mg Oral BID WC   pantoprazole  40 mg Oral Daily   Continuous Infusions:  heparin 1,500 Units/hr (05/18/21 2232)   PRN Meds:.acetaminophen **OR** acetaminophen (TYLENOL) oral liquid 160 mg/5 mL **OR** acetaminophen,  senna-docusate  Antibiotics  :    Anti-infectives (From admission, onward)    None        Time Spent in minutes  30   Wayne Ortiz M.D on 05/19/2021 at 10:33 AM  To page go to [email or web address]   [facility] -  Office  [phone]  See all Orders from today for further details    Objective:   Vitals:   05/18/21 2213 05/19/21 0000 05/19/21 0805 05/19/21 0809  BP: 133/89 (!) 132/101 (!) 127/107 115/89  Pulse:  89 74   Resp: 18 16 18    Temp: 97.9 F (36.6 C) 97.8 F (36.6 C) 98 F (36.7 C)   TempSrc: Oral Oral Axillary   SpO2: 93% 90% 90%   Weight:        Wt Readings from Last 3 Encounters:  05/18/21 111.6 kg  03/05/21 117 kg  02/27/21 117 kg    No intake or output data in the 24 hours ending 05/19/21 1033    Physical Exam  Awake Alert, No new F.N deficits, Normal affect [place].AT,PERRAL Supple Neck, No JVD,   Symmetrical Chest wall movement, Good air movement bilaterally, CTAB RRR,No Gallops, Rubs or new Murmurs,  +ve B.Sounds, Abd Soft, No tenderness,   No Cyanosis, Clubbing or edema       Data Review:    CBC Recent Labs  Lab 05/17/21 1711 05/18/21 0059 05/19/21 0136  WBC 5.3 5.4 4.3  HGB 16.7 16.0 15.8  HCT 49.4 48.0 46.8  PLT 145* 129* 123*  MCV 103.6* 101.1* 99.6  MCH 35.0* 33.7 33.6  MCHC 33.8 33.3 33.8  RDW 13.4 13.2 13.2  LYMPHSABS 1.6  --  1.6  MONOABS 0.5  --  0.4  EOSABS 0.0  --  0.0  BASOSABS 0.0  --  0.0    Electrolytes Recent Labs  Lab 05/17/21 1711 05/18/21 0059 05/18/21 0928 05/19/21 0136  NA 142 140  --  141  K 4.0 4.2  --  3.8  CL 105 105  --  107  CO2 26 27  --  24  GLUCOSE 98 102*  --  107*  BUN 21 18  --  20  CREATININE 1.15 1.10  --  1.13  CALCIUM 9.2 9.3  --  9.1  AST 22 19  --  17  ALT 20 18  --  17  ALKPHOS 53 51  --  54  BILITOT 0.5 0.5  --  0.8  ALBUMIN 4.4 4.1  --  3.9  MG  --   --   --  2.0  CRP  --   --   --  0.8  INR 1.1  --   --   --   TSH  --   --  8.129*  --   BNP  --   --   --   52.5    ------------------------------------------------------------------------------------------------------------------ Recent Labs    05/18/21 0059  CHOL 192  HDL 46  LDLCALC 125*  TRIG 105  CHOLHDL 4.2    Lab Results  Component Value Date   HGBA1C 5.8 (H) 08/08/2014    Recent Labs    05/18/21 0928  TSH 8.129*   ------------------------------------------------------------------------------------------------------------------ ID Labs Recent Labs  Lab 05/17/21 1711 05/18/21 0059 05/19/21 0136  WBC 5.3 5.4 4.3  PLT 145* 129* 123*  CRP  --   --  0.8  CREATININE 1.15 1.10 1.13   Cardiac Enzymes No results for input(s): CKMB, TROPONINI, MYOGLOBIN in the last 168 hours.  Invalid input(s): CK   Radiology Reports CT Angio Head W or Wo Contrast  Result Date: 05/17/2021 CLINICAL DATA:  Neuro deficit, stroke suspected, visual problems EXAM: CT ANGIOGRAPHY HEAD AND NECK TECHNIQUE: Multidetector CT imaging of the head and neck was performed using the standard protocol during bolus administration of intravenous contrast. Multiplanar CT image reconstructions and MIPs were obtained to evaluate the vascular anatomy. Carotid stenosis measurements (when applicable) are obtained utilizing NASCET criteria, using the distal internal carotid diameter as the denominator. RADIATION DOSE REDUCTION: This exam was performed according to the departmental dose-optimization program which includes automated exposure control, adjustment of the mA and/or kV according to patient size and/or use of iterative reconstruction technique. CONTRAST:  20mL OMNIPAQUE IOHEXOL 350 MG/ML SOLN COMPARISON:  Prior CTA, correlation is made with CT head 05/17/2021 FINDINGS: CT HEAD FINDINGS For noncontrast findings, please see same day CT head. CTA NECK FINDINGS Aortic arch: Two-vessel arch with a common origin of the brachiocephalic and left common carotid arteries. Imaged portion shows no evidence of aneurysm or  dissection. No significant stenosis of the major arch vessel origins. Aortic atherosclerosis Right carotid system: No evidence of dissection, stenosis (50% or greater) or occlusion. Left carotid system: Approximately 70% stenosis in the distal left common carotid and at the bifurcation (series 7, image 223) without hemodynamically significant stenosis in the proximal left ICA Vertebral arteries: Mild calcifications in the right V1 segment, which are not hemodynamically significant. The left vertebral artery is somewhat small but otherwise  unremarkable. No evidence of dissection or occlusion. Skeleton: 5 mm anterolisthesis of C3 on C4, which appears degenerative and longstanding. Status post ACDF C5-T1. No acute osseous abnormality. Other neck: Negative. Upper chest: Apical pleural-parenchymal scarring. No pleural effusion. Review of the MIP images confirms the above findings CTA HEAD FINDINGS Anterior circulation: Both internal carotid arteries are patent to the termini, with calcifications in the bilateral cavernous and supraclinoid segments, which cause mild stenosis. A1 segments patent. Normal anterior communicating artery. Anterior cerebral arteries are patent to their distal aspects. No M1 stenosis or occlusion. Normal MCA bifurcations. Distal MCA branches perfused and symmetric. Posterior circulation: The left vertebral artery primarily supplies PICA. The right vertebral artery is patent to the vertebrobasilar junction, with minimal calcifications which are not hemodynamically significant. Posterior inferior cerebral arteries patent bilaterally. Basilar patent to its distal aspect. Superior cerebellar arteries patent bilaterally. Bilateral P1 segments originate from the basilar artery. The right posterior communicating artery is visualized. The left posterior communicating artery is not definitively seen. PCAs perfused to their distal aspects without stenosis. Venous sinuses: As permitted by contrast timing,  patent. Anatomic variants: None significant Review of the MIP images confirms the above findings IMPRESSION: 1. No intracranial large vessel occlusion. Mild stenosis in the right cavernous and supraclinoid ICA without additional hemodynamically significant intracranial stenosis. 2. Approximately 70% stenosis in the distal left common carotid artery and at the bifurcation, without significant stenosis in the proximal left ICA. No other hemodynamically significant stenosis in the neck. Electronically Signed   By: Merilyn Baba M.D.   On: 05/17/2021 19:38   CT HEAD WO CONTRAST  Result Date: 05/17/2021 CLINICAL DATA:  Visual problems for 2 months, most recently 2 days ago. EXAM: CT HEAD WITHOUT CONTRAST TECHNIQUE: Contiguous axial images were obtained from the base of the skull through the vertex without intravenous contrast. RADIATION DOSE REDUCTION: This exam was performed according to the departmental dose-optimization program which includes automated exposure control, adjustment of the mA and/or kV according to patient size and/or use of iterative reconstruction technique. COMPARISON:  None. FINDINGS: Brain: There is no evidence for acute hemorrhage, hydrocephalus, mass lesion, or abnormal extra-axial fluid collection. No definite CT evidence for acute infarction. Diffuse loss of parenchymal volume is consistent with atrophy. Patchy low attenuation in the deep hemispheric and periventricular white matter is nonspecific, but likely reflects chronic microvascular ischemic demyelination. Vascular: No hyperdense vessel or unexpected calcification. Skull: No evidence for fracture. No worrisome lytic or sclerotic lesion. Sinuses/Orbits: The visualized paranasal sinuses and mastoid air cells are clear. Visualized portions of the globes and intraorbital fat are unremarkable. Other: None. IMPRESSION: 1. No acute intracranial abnormality. 2. Atrophy with chronic small vessel ischemic disease. Electronically Signed   By:  Misty Stanley M.D.   On: 05/17/2021 17:31   CT Angio Neck W and/or Wo Contrast  Result Date: 05/17/2021 CLINICAL DATA:  Neuro deficit, stroke suspected, visual problems EXAM: CT ANGIOGRAPHY HEAD AND NECK TECHNIQUE: Multidetector CT imaging of the head and neck was performed using the standard protocol during bolus administration of intravenous contrast. Multiplanar CT image reconstructions and MIPs were obtained to evaluate the vascular anatomy. Carotid stenosis measurements (when applicable) are obtained utilizing NASCET criteria, using the distal internal carotid diameter as the denominator. RADIATION DOSE REDUCTION: This exam was performed according to the departmental dose-optimization program which includes automated exposure control, adjustment of the mA and/or kV according to patient size and/or use of iterative reconstruction technique. CONTRAST:  26mL OMNIPAQUE IOHEXOL 350 MG/ML SOLN COMPARISON:  Prior CTA, correlation is made with CT head 05/17/2021 FINDINGS: CT HEAD FINDINGS For noncontrast findings, please see same day CT head. CTA NECK FINDINGS Aortic arch: Two-vessel arch with a common origin of the brachiocephalic and left common carotid arteries. Imaged portion shows no evidence of aneurysm or dissection. No significant stenosis of the major arch vessel origins. Aortic atherosclerosis Right carotid system: No evidence of dissection, stenosis (50% or greater) or occlusion. Left carotid system: Approximately 70% stenosis in the distal left common carotid and at the bifurcation (series 7, image 223) without hemodynamically significant stenosis in the proximal left ICA Vertebral arteries: Mild calcifications in the right V1 segment, which are not hemodynamically significant. The left vertebral artery is somewhat small but otherwise unremarkable. No evidence of dissection or occlusion. Skeleton: 5 mm anterolisthesis of C3 on C4, which appears degenerative and longstanding. Status post ACDF C5-T1. No  acute osseous abnormality. Other neck: Negative. Upper chest: Apical pleural-parenchymal scarring. No pleural effusion. Review of the MIP images confirms the above findings CTA HEAD FINDINGS Anterior circulation: Both internal carotid arteries are patent to the termini, with calcifications in the bilateral cavernous and supraclinoid segments, which cause mild stenosis. A1 segments patent. Normal anterior communicating artery. Anterior cerebral arteries are patent to their distal aspects. No M1 stenosis or occlusion. Normal MCA bifurcations. Distal MCA branches perfused and symmetric. Posterior circulation: The left vertebral artery primarily supplies PICA. The right vertebral artery is patent to the vertebrobasilar junction, with minimal calcifications which are not hemodynamically significant. Posterior inferior cerebral arteries patent bilaterally. Basilar patent to its distal aspect. Superior cerebellar arteries patent bilaterally. Bilateral P1 segments originate from the basilar artery. The right posterior communicating artery is visualized. The left posterior communicating artery is not definitively seen. PCAs perfused to their distal aspects without stenosis. Venous sinuses: As permitted by contrast timing, patent. Anatomic variants: None significant Review of the MIP images confirms the above findings IMPRESSION: 1. No intracranial large vessel occlusion. Mild stenosis in the right cavernous and supraclinoid ICA without additional hemodynamically significant intracranial stenosis. 2. Approximately 70% stenosis in the distal left common carotid artery and at the bifurcation, without significant stenosis in the proximal left ICA. No other hemodynamically significant stenosis in the neck. Electronically Signed   By: Merilyn Baba M.D.   On: 05/17/2021 19:38   MR BRAIN W WO CONTRAST  Result Date: 05/18/2021 CLINICAL DATA:  76 year old male with vision problems for the past 2 months. Neurologic deficit. EXAM:  MRI HEAD AND ORBITS WITHOUT AND WITH CONTRAST TECHNIQUE: Multiplanar, multiecho pulse sequences of the brain and surrounding structures were obtained without and with intravenous contrast. Multiplanar, multiecho pulse sequences of the orbits and surrounding structures were obtained including fat saturation techniques, before and after intravenous contrast administration. CONTRAST:  68mL GADAVIST GADOBUTROL 1 MMOL/ML IV SOLN COMPARISON:  CTA head and neck and head CT yesterday. FINDINGS: MRI HEAD FINDINGS Brain: No restricted diffusion to suggest acute infarction. No midline shift, mass effect, evidence of mass lesion, ventriculomegaly, extra-axial collection or acute intracranial hemorrhage. Cervicomedullary junction and pituitary are within normal limits. Scattered patchy cerebral white matter T2 and FLAIR hyperintensity in both hemispheres, mild to moderate for age. No cortical encephalomalacia or chronic cerebral blood products identified. 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 Mild T2 heterogeneity in the bilateral basal ganglia. Thalami, brainstem and cerebellum are within normal limits. No abnormal enhancement identified. No dural thickening. Vascular: Major intracranial vascular flow voids are preserved. Dominant right vertebral artery as  seen on CTA. The major dural venous sinuses are enhancing and appear patent. Skull and upper cervical spine: Negative visible cervical spine. Visualized bone marrow signal is within normal limits. Other: Right anterior scalp susceptibility artifact related to punctate metallic foreign body retained as seen on recent head CT. But otherwise negative visible scalp and face soft tissues. Visible internal auditory structures appear normal. MRI ORBITS FINDINGS Orbits: Normal suprasellar cistern and optic chiasm. Cavernous sinus appears symmetric and normal. Bilateral optic nerves appear symmetric and within normal limits. No intraorbital mass or  inflammation. No abnormal intraorbital enhancement identified. Postoperative changes to both globes which otherwise appear normal. Visualized sinuses: Negative. Soft tissues: Negative periorbital and visible deep soft tissue spaces of the face. IMPRESSION: 1. Normal MRI appearance of the orbits. 2. No acute intracranial abnormality. Mild to moderate for age signal changes in the cerebral white matter and basal ganglia most commonly due to chronic small vessel disease. Electronically Signed   By: Genevie Ann M.D.   On: 05/18/2021 07:09   ECHOCARDIOGRAM COMPLETE  Result Date: 05/18/2021    ECHOCARDIOGRAM REPORT   Patient Name:   Wayne Ortiz Date of Exam: 05/18/2021 Medical Rec #:  [id number]     Height:       72.0 in Accession #:    [id number]    Weight:       257.9 lb Date of Birth:  10/27/1945      BSA:          2.374 m Patient Age:    17 years      BP:           118/91 mmHg Patient Gender: M             HR:           91 bpm. Exam Location:  Inpatient Procedure: 2D Echo, Cardiac Doppler and Color Doppler Indications:    TIA  History:        Patient has prior history of Echocardiogram examinations, most                 recent 12/01/2014. CAD, Arrythmias:Atrial Fibrillation,                 Signs/Symptoms:Dyspnea; Risk Factors:Hypertension. CA/                 DYSRHYTHMIA.  Sonographer:    Beryle Beams Referring Phys: [id number] ASIA B [place] IMPRESSIONS  1. Left ventricular ejection fraction, by estimation, is 55%. The left ventricle has normal function. The left ventricle has no regional wall motion abnormalities. There is mild left ventricular hypertrophy. Left ventricular diastolic parameters are consistent with Grade I diastolic dysfunction (impaired relaxation).  2. Right ventricular systolic function is moderately reduced. The right ventricular size is moderately enlarged. There is normal pulmonary artery systolic pressure.  3. The mitral valve is abnormal. Trivial mitral valve regurgitation. No evidence of  mitral stenosis.  4. The aortic valve is tricuspid. There is mild calcification of the aortic valve. Aortic valve regurgitation is not visualized. Aortic valve sclerosis is present, with no evidence of aortic valve stenosis.  5. The inferior vena cava is normal in size with greater than 50% respiratory variability, suggesting right atrial pressure of 3 mmHg. FINDINGS  Left Ventricle: Left ventricular ejection fraction, by estimation, is 55%. The left ventricle has normal function. The left ventricle has no regional wall motion abnormalities. The left ventricular internal cavity size was normal in size. There is mild left ventricular  hypertrophy. Left ventricular diastolic parameters are consistent with Grade I diastolic dysfunction (impaired relaxation). Right Ventricle: The right ventricular size is moderately enlarged. No increase in right ventricular wall thickness. Right ventricular systolic function is moderately reduced. There is normal pulmonary artery systolic pressure. The tricuspid regurgitant velocity is 2.74 m/s, and with an assumed right atrial pressure of 3 mmHg, the estimated right ventricular systolic pressure is 32.4 mmHg. Left Atrium: Left atrial size was normal in size. Right Atrium: Right atrial size was normal in size. Pericardium: There is no evidence of pericardial effusion. Mitral Valve: The mitral valve is abnormal. There is mild thickening of the mitral valve leaflet(s). There is mild calcification of the mitral valve leaflet(s). Trivial mitral valve regurgitation. No evidence of mitral valve stenosis. MV peak gradient, 35.3 mmHg. The mean mitral valve gradient is 26.0 mmHg. Tricuspid Valve: The tricuspid valve is normal in structure. Tricuspid valve regurgitation is mild . No evidence of tricuspid stenosis. Aortic Valve: The aortic valve is tricuspid. There is mild calcification of the aortic valve. Aortic valve regurgitation is not visualized. Aortic valve sclerosis is present, with no  evidence of aortic valve stenosis. Aortic valve mean gradient measures 2.0 mmHg. Aortic valve peak gradient measures 3.0 mmHg. Aortic valve area, by VTI measures 1.92 cm. Pulmonic Valve: The pulmonic valve was normal in structure. Pulmonic valve regurgitation is not visualized. No evidence of pulmonic stenosis. Aorta: The aortic root is normal in size and structure. Venous: The inferior vena cava is normal in size with greater than 50% respiratory variability, suggesting right atrial pressure of 3 mmHg. IAS/Shunts: No atrial level shunt detected by color flow Doppler.  LEFT VENTRICLE PLAX 2D LVIDd:         4.00 cm     Diastology LVIDs:         2.00 cm     LV e' medial:    7.58 cm/s LV PW:         1.30 cm     LV E/e' medial:  7.1 LV IVS:        1.20 cm     LV e' lateral:   6.34 cm/s LVOT diam:     2.10 cm     LV E/e' lateral: 8.5 LV SV:         28 LV SV Index:   12 LVOT Area:     3.46 cm  LV Volumes (MOD) LV vol d, MOD A2C: 51.0 ml LV vol d, MOD A4C: 79.8 ml LV vol s, MOD A4C: 41.6 ml LV SV MOD A4C:     79.8 ml RIGHT VENTRICLE RV S prime:     16.20 cm/s TAPSE (M-mode): 2.0 cm LEFT ATRIUM             Index        RIGHT ATRIUM           Index LA diam:        3.50 cm 1.47 cm/m   RA Area:     11.90 cm LA Vol (A2C):   59.4 ml 25.02 ml/m  RA Volume:   21.30 ml  8.97 ml/m LA Vol (A4C):   45.0 ml 18.96 ml/m LA Biplane Vol: 57.3 ml 24.14 ml/m  AORTIC VALVE                    PULMONIC VALVE AV Area (Vmax):    2.07 cm     PV Vmax:       0.40 m/s AV  Area (Vmean):   1.88 cm     PV Vmean:      22.500 cm/s AV Area (VTI):     1.92 cm     PV VTI:        0.043 m AV Vmax:           87.30 cm/s   PV Peak grad:  0.7 mmHg AV Vmean:          60.100 cm/s  PV Mean grad:  0.0 mmHg AV VTI:            0.147 m AV Peak Grad:      3.0 mmHg AV Mean Grad:      2.0 mmHg LVOT Vmax:         52.30 cm/s LVOT Vmean:        32.600 cm/s LVOT VTI:          0.081 m LVOT/AV VTI ratio: 0.55  AORTA Ao Root diam: 3.70 cm Ao Asc diam:  3.50 cm MITRAL  VALVE               TRICUSPID VALVE MV Area (PHT): 3.50 cm    TV Peak grad:   28.5 mmHg MV Area VTI:   0.31 cm    TV Mean grad:   21.0 mmHg MV Peak grad:  35.3 mmHg   TV Vmax:        2.67 m/s MV Mean grad:  26.0 mmHg   TV Vmean:       216.0 cm/s MV Vmax:       2.97 m/s    TV VTI:         0.74 msec MV Vmean:      246.0 cm/s  TR Peak grad:   30.0 mmHg MV Decel Time: 217 msec    TR Vmax:        274.00 cm/s MV E velocity: 54.00 cm/s MV A velocity: 30.40 cm/s  SHUNTS MV E/A ratio:  1.78        Systemic VTI:  0.08 m                            Systemic Diam: 2.10 cm Jenkins Rouge MD Electronically signed by Jenkins Rouge MD Signature Date/Time: 05/18/2021/12:59:55 PM    Final    VAS US CAROTID  Result Date: 05/18/2021 Carotid Arterial Duplex Study Patient Name:  Wayne Ortiz  Date of Exam:   05/18/2021 Medical Rec #: [id number]      Accession #:    [id number] Date of Birth: Oct 06, 1945       Patient Gender: M Patient Age:   72 years Exam Location:  [facility] Procedure:      VAS US CAROTID Referring Phys: Harrell Gave DICKSON --------------------------------------------------------------------------------  Indications:       Visual disturbance; three episodes of left amaurosis fugax. Risk Factors:      Hypertension, past history of smoking, coronary artery                    disease. Comparison Study:  No prior study Performing Technologist: Maudry Mayhew MHA, RDMS, RVT, RDCS  Examination Guidelines: A complete evaluation includes B-mode imaging, spectral Doppler, color Doppler, and power Doppler as needed of all accessible portions of each vessel. Bilateral testing is considered an integral part of a complete examination. Limited examinations for reoccurring indications may be performed as noted.  Right Carotid Findings: +----------+--------+--------+--------+--------------------------+--------+  PSV cm/s EDV cm/s Stenosis Plaque Description         Comments   +----------+--------+--------+--------+--------------------------+--------+  CCA Prox   78       21                                                     +----------+--------+--------+--------+--------------------------+--------+  CCA Distal 71       21                irregular and heterogenous           +----------+--------+--------+--------+--------------------------+--------+  ICA Prox   42       12                calcific and smooth                  +----------+--------+--------+--------+--------------------------+--------+  ICA Distal 62       27                                                     +----------+--------+--------+--------+--------------------------+--------+  ECA        83       18                heterogenous and irregular           +----------+--------+--------+--------+--------------------------+--------+ +----------+--------+-------+----------------+-------------------+             PSV cm/s EDV cms Describe         Arm Pressure (mmHG)  +----------+--------+-------+----------------+-------------------+  Subclavian 72               Multiphasic, WNL                      +----------+--------+-------+----------------+-------------------+ +---------+--------+--+--------+--+---------+  Vertebral PSV cm/s 23 EDV cm/s 10 Antegrade  +---------+--------+--+--------+--+---------+  Left Carotid Findings: +----------+--------+--------+--------+-------------------------+---------+             PSV cm/s EDV cm/s Stenosis Plaque Description        Comments   +----------+--------+--------+--------+-------------------------+---------+  CCA Prox   65       22                smooth and heterogenous              +----------+--------+--------+--------+-------------------------+---------+  CCA Distal 78       23       <50%     calcific and focal        Shadowing  +----------+--------+--------+--------+-------------------------+---------+  ICA Prox   36       14       1-39%    heterogenous and calcific Shadowing   +----------+--------+--------+--------+-------------------------+---------+  ICA Mid    55       29                                                     +----------+--------+--------+--------+-------------------------+---------+  ICA Distal 68       31                                                     +----------+--------+--------+--------+-------------------------+---------+  ECA        184      43                                                     +----------+--------+--------+--------+-------------------------+---------+ +----------+--------+--------+----------------+-------------------+             PSV cm/s EDV cm/s Describe         Arm Pressure (mmHG)  +----------+--------+--------+----------------+-------------------+  Subclavian 87                Multiphasic, WNL                      +----------+--------+--------+----------------+-------------------+ +---------+--------+--+--------+-+---------+  Vertebral PSV cm/s 15 EDV cm/s 5 Antegrade  +---------+--------+--+--------+-+---------+   Summary: Right Carotid: Velocities in the right ICA are consistent with a 1-39% stenosis.                Non-hemodynamically significant plaque <50% noted in the CCA. Left Carotid: Velocities in the left ICA are consistent with a 1-39% stenosis.               Non-hemodynamically significant plaque <50% noted in the CCA. Vertebrals:  Bilateral vertebral arteries demonstrate antegrade flow. Subclavians: Normal flow hemodynamics were seen in bilateral subclavian              arteries. *See table(s) above for measurements and observations.  Electronically signed by [name] MD on 05/18/2021 at 12:08:33 PM.    Final    MR ORBITS W WO CONTRAST  Result Date: 05/18/2021 CLINICAL DATA:  76 year old male with vision problems for the past 2 months. Neurologic deficit. EXAM: MRI HEAD AND ORBITS WITHOUT AND WITH CONTRAST TECHNIQUE: Multiplanar, multiecho pulse sequences of the brain and surrounding structures were obtained  without and with intravenous contrast. Multiplanar, multiecho pulse sequences of the orbits and surrounding structures were obtained including fat saturation techniques, before and after intravenous contrast administration. CONTRAST:  10mL GADAVIST GADOBUTROL 1 MMOL/ML IV SOLN COMPARISON:  CTA head and neck and head CT yesterday. FINDINGS: MRI HEAD FINDINGS Brain: No restricted diffusion to suggest acute infarction. No midline shift, mass effect, evidence of mass lesion, ventriculomegaly, extra-axial collection or acute intracranial hemorrhage. Cervicomedullary junction and pituitary are within normal limits. Scattered patchy cerebral white matter T2 and FLAIR hyperintensity in both hemispheres, mild to moderate for age. No cortical encephalomalacia or chronic cerebral blood products identified. 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 Mild T2 heterogeneity in the bilateral basal ganglia. Thalami, brainstem and cerebellum are within normal limits. No abnormal enhancement identified. No dural thickening. Vascular: Major intracranial vascular flow voids are preserved. Dominant right vertebral artery as seen on CTA. The major dural venous sinuses are enhancing and appear patent. Skull and upper cervical spine: Negative visible cervical spine. Visualized bone marrow signal is within normal limits. Other: Right anterior scalp susceptibility artifact related to punctate metallic foreign body retained as seen on recent head CT. But otherwise negative visible scalp and face soft tissues. Visible internal auditory structures appear normal. MRI ORBITS FINDINGS Orbits: Normal suprasellar cistern and optic chiasm. Cavernous sinus appears symmetric and normal. Bilateral optic nerves appear symmetric and within normal limits. No intraorbital mass or inflammation. No abnormal intraorbital enhancement identified. Postoperative changes to both globes which otherwise appear normal. Visualized sinuses:  Negative. Soft tissues:  Negative periorbital and visible deep soft tissue spaces of the face. IMPRESSION: 1. Normal MRI appearance of the orbits. 2. No acute intracranial abnormality. Mild to moderate for age signal changes in the cerebral white matter and basal ganglia most commonly due to chronic small vessel disease. Electronically Signed   By: Genevie Ann M.D.   On: 05/18/2021 07:09

## 2021-05-20 DIAGNOSIS — I4819 Other persistent atrial fibrillation: Secondary | ICD-10-CM | POA: Diagnosis not present

## 2021-05-20 DIAGNOSIS — E7800 Pure hypercholesterolemia, unspecified: Secondary | ICD-10-CM | POA: Diagnosis not present

## 2021-05-20 DIAGNOSIS — I6522 Occlusion and stenosis of left carotid artery: Secondary | ICD-10-CM | POA: Diagnosis not present

## 2021-05-20 DIAGNOSIS — I10 Essential (primary) hypertension: Secondary | ICD-10-CM | POA: Diagnosis not present

## 2021-05-20 LAB — MAGNESIUM: Magnesium: 2.2 mg/dL (ref 1.7–2.4)

## 2021-05-20 LAB — CBC WITH DIFFERENTIAL/PLATELET
Abs Immature Granulocytes: 0.01 10*3/uL (ref 0.00–0.07)
Basophils Absolute: 0 10*3/uL (ref 0.0–0.1)
Basophils Relative: 1 %
Eosinophils Absolute: 0 10*3/uL (ref 0.0–0.5)
Eosinophils Relative: 0 %
HCT: 47.8 % (ref 39.0–52.0)
Hemoglobin: 16.1 g/dL (ref 13.0–17.0)
Immature Granulocytes: 0 %
Lymphocytes Relative: 42 %
Lymphs Abs: 2.1 10*3/uL (ref 0.7–4.0)
MCH: 33.6 pg (ref 26.0–34.0)
MCHC: 33.7 g/dL (ref 30.0–36.0)
MCV: 99.8 fL (ref 80.0–100.0)
Monocytes Absolute: 0.5 10*3/uL (ref 0.1–1.0)
Monocytes Relative: 10 %
Neutro Abs: 2.3 10*3/uL (ref 1.7–7.7)
Neutrophils Relative %: 47 %
Platelets: 125 10*3/uL — ABNORMAL LOW (ref 150–400)
RBC: 4.79 MIL/uL (ref 4.22–5.81)
RDW: 13.3 % (ref 11.5–15.5)
WBC: 4.9 10*3/uL (ref 4.0–10.5)
nRBC: 0 % (ref 0.0–0.2)

## 2021-05-20 LAB — COMPREHENSIVE METABOLIC PANEL
ALT: 17 U/L (ref 0–44)
AST: 19 U/L (ref 15–41)
Albumin: 4 g/dL (ref 3.5–5.0)
Alkaline Phosphatase: 54 U/L (ref 38–126)
Anion gap: 9 (ref 5–15)
BUN: 20 mg/dL (ref 8–23)
CO2: 22 mmol/L (ref 22–32)
Calcium: 8.9 mg/dL (ref 8.9–10.3)
Chloride: 107 mmol/L (ref 98–111)
Creatinine, Ser: 1.13 mg/dL (ref 0.61–1.24)
GFR, Estimated: >60 mL/min (ref >60)
Glucose, Bld: 101 mg/dL — ABNORMAL HIGH (ref 70–99)
Potassium: 4 mmol/L (ref 3.5–5.1)
Sodium: 138 mmol/L (ref 135–145)
Total Bilirubin: 0.5 mg/dL (ref 0.3–1.2)
Total Protein: 6.7 g/dL (ref 6.5–8.1)

## 2021-05-20 LAB — HEMOGLOBIN A1C
Hgb A1c MFr Bld: 5.7 % — ABNORMAL HIGH (ref 4.8–5.6)
Mean Plasma Glucose: 117 mg/dL

## 2021-05-20 LAB — APTT: aPTT: 114 seconds — ABNORMAL HIGH (ref 24–36)

## 2021-05-20 LAB — HEPARIN LEVEL (UNFRACTIONATED)
Heparin Unfractionated: 0.79 IU/mL — ABNORMAL HIGH (ref 0.30–0.70)
Heparin Unfractionated: 0.54 IU/mL (ref 0.30–0.70)

## 2021-05-20 LAB — SURGICAL PCR SCREEN
MRSA, PCR: NEGATIVE
Staphylococcus aureus: NEGATIVE

## 2021-05-20 LAB — BRAIN NATRIURETIC PEPTIDE: B Natriuretic Peptide: 117.7 pg/mL — ABNORMAL HIGH (ref 0.0–100.0)

## 2021-05-20 MED ORDER — HEPARIN (PORCINE) 25000 UT/250ML-% IV SOLN
1100.0000 [IU]/h | INTRAVENOUS | Status: DC
  Administered 2021-05-20: 1100 [IU]/h via INTRAVENOUS
  Filled 2021-05-20: qty 250

## 2021-05-20 MED ORDER — ACETAMINOPHEN 500 MG PO TABS
1000.0000 mg | ORAL_TABLET | Freq: Once | ORAL | Status: AC
  Administered 2021-05-20: 1000 mg via ORAL
  Filled 2021-05-20: qty 2

## 2021-05-20 MED ORDER — ALPRAZOLAM 0.5 MG PO TABS
0.5000 mg | ORAL_TABLET | Freq: Two times a day (BID) | ORAL | Status: DC | PRN
  Administered 2021-05-20: 0.5 mg via ORAL
  Filled 2021-05-20: qty 1

## 2021-05-20 MED ORDER — CELECOXIB 200 MG PO CAPS
200.0000 mg | ORAL_CAPSULE | Freq: Once | ORAL | Status: AC
Start: 2021-05-20 — End: 2021-05-20
  Administered 2021-05-20: 200 mg via ORAL
  Filled 2021-05-20: qty 1

## 2021-05-20 MED ORDER — LORAZEPAM 0.5 MG PO TABS
0.5000 mg | ORAL_TABLET | Freq: Once | ORAL | Status: AC | PRN
  Administered 2021-05-20: 0.5 mg via ORAL
  Filled 2021-05-20: qty 1

## 2021-05-20 NOTE — H&P (View-Only) (Signed)
° °  VASCULAR SURGERY ASSESSMENT & PLAN:   SYMPTOMATIC LEFT CAROTID STENOSIS: This patient has asymptomatic left carotid stenosis.  Has had 3 episodes of amaurosis fugax.  He is scheduled for left carotid endarterectomy tomorrow.  I have previously discussed the indications for the procedure, that is to lower his risk of future stroke, and the potential complications including but not limited to a 1 to 2% risk of perioperative stroke.  All of his questions were answered he is agreeable to proceed.  I have written preop orders.   ANTICOAGULATION: His apixaban is on hold and he is now on IV heparin.  I will stop his heparin at 7 AM.   CARDIAC: His echo this admission shows an ejection fraction of 55%.  LV function is normal   SUBJECTIVE:   No complaints this morning.  No further symptoms.  PHYSICAL EXAM:   Vitals:   05/19/21 1937 05/20/21 0034 05/20/21 0308 05/20/21 0418  BP: 115/86     Pulse: 82     Resp: 19     Temp: 98 F (36.7 C)     TempSrc: Oral     SpO2:  (!) 80% (!) 82% (!) 84%  Weight:       No focal weakness.  LABS:   Lab Results  Component Value Date   WBC 4.9 05/20/2021   HGB 16.1 05/20/2021   HCT 47.8 05/20/2021   MCV 99.8 05/20/2021   PLT 125 (L) 05/20/2021   Lab Results  Component Value Date   CREATININE 1.13 05/20/2021   Lab Results  Component Value Date   INR 1.1 05/17/2021    PROBLEM LIST:    Principal Problem:   TIA (transient ischemic attack) Active Problems:   Persistent atrial fibrillation (HCC)   Essential hypertension   Hypothyroidism   CURRENT MEDS:    aspirin EC  81 mg Oral Daily   ezetimibe  10 mg Oral Daily   furosemide  40 mg Oral Daily   gabapentin  900 mg Oral QHS   levothyroxine  112 mcg Oral Q0200   lidocaine  1 patch Transdermal QHS   metoprolol tartrate  25 mg Oral BID   niacin  100 mg Oral BID WC   pantoprazole  40 mg Oral Daily    [name] Office: [phone] 05/20/2021

## 2021-05-20 NOTE — Anesthesia Preprocedure Evaluation (Addendum)
Anesthesia Evaluation  Patient identified by MRN, date of birth, ID band Patient awake    Reviewed: Allergy & Precautions, NPO status , Patient's Chart, lab work & pertinent test results  History of Anesthesia Complications Negative for: history of anesthetic complications  Airway Mallampati: I  TM Distance: >3 FB Neck ROM: Full    Dental  (+) Edentulous Upper, Missing   Pulmonary neg pulmonary ROS, former smoker,    Pulmonary exam normal        Cardiovascular hypertension, (-) angina+ CAD and + Cardiac Stents  Normal cardiovascular exam+ dysrhythmias Atrial Fibrillation   IMPRESSIONS    1. Left ventricular ejection fraction, by estimation, is 55%. The left  ventricle has normal function. The left ventricle has no regional wall  motion abnormalities. There is mild left ventricular hypertrophy. Left  ventricular diastolic parameters are  consistent with Grade I diastolic dysfunction (impaired relaxation).  2. Right ventricular systolic function is moderately reduced. The right  ventricular size is moderately enlarged. There is normal pulmonary artery  systolic pressure.  3. The mitral valve is abnormal. Trivial mitral valve regurgitation. No  evidence of mitral stenosis.  4. The aortic valve is tricuspid. There is mild calcification of the  aortic valve. Aortic valve regurgitation is not visualized. Aortic valve  sclerosis is present, with no evidence of aortic valve stenosis.  5. The inferior vena cava is normal in size with greater than 50%  respiratory variability, suggesting right atrial pressure of 3 mmHg.    Neuro/Psych TIA   GI/Hepatic Neg liver ROS, GERD  ,  Endo/Other  Hypothyroidism   Renal/GU negative Renal ROS     Musculoskeletal negative musculoskeletal ROS (+)   Abdominal   Peds  Hematology negative hematology ROS (+)   Anesthesia Other Findings   Reproductive/Obstetrics                             Anesthesia Physical Anesthesia Plan  ASA: 3  Anesthesia Plan: General   Post-op Pain Management: Celebrex PO (pre-op) and Tylenol PO (pre-op)   Induction:   PONV Risk Score and Plan: 3 and Ondansetron, Dexamethasone, Diphenhydramine and Treatment may vary due to age or medical condition  Airway Management Planned: Oral ETT  Additional Equipment: Arterial line  Intra-op Plan:   Post-operative Plan: Extubation in OR  Informed Consent: I have reviewed the patients History and Physical, chart, labs and discussed the procedure including the risks, benefits and alternatives for the proposed anesthesia with the patient or authorized representative who has indicated his/her understanding and acceptance.     Dental advisory given  Plan Discussed with: Anesthesiologist and CRNA  Anesthesia Plan Comments:        Anesthesia Quick Evaluation

## 2021-05-20 NOTE — Progress Notes (Signed)
° °  VASCULAR SURGERY ASSESSMENT & PLAN:   SYMPTOMATIC LEFT CAROTID STENOSIS: This patient has asymptomatic left carotid stenosis.  Has had 3 episodes of amaurosis fugax.  He is scheduled for left carotid endarterectomy tomorrow.  I have previously discussed the indications for the procedure, that is to lower his risk of future stroke, and the potential complications including but not limited to a 1 to 2% risk of perioperative stroke.  All of his questions were answered he is agreeable to proceed.  I have written preop orders.   ANTICOAGULATION: His apixaban is on hold and he is now on IV heparin.  I will stop his heparin at 7 AM.   CARDIAC: His echo this admission shows an ejection fraction of 55%.  LV function is normal   SUBJECTIVE:   No complaints this morning.  No further symptoms.  PHYSICAL EXAM:   Vitals:   05/19/21 1937 05/20/21 0034 05/20/21 0308 05/20/21 0418  BP: 115/86     Pulse: 82     Resp: 19     Temp: 98 F (36.7 C)     TempSrc: Oral     SpO2:  (!) 80% (!) 82% (!) 84%  Weight:       No focal weakness.  LABS:   Lab Results  Component Value Date   WBC 4.9 05/20/2021   HGB 16.1 05/20/2021   HCT 47.8 05/20/2021   MCV 99.8 05/20/2021   PLT 125 (L) 05/20/2021   Lab Results  Component Value Date   CREATININE 1.13 05/20/2021   Lab Results  Component Value Date   INR 1.1 05/17/2021    PROBLEM LIST:    Principal Problem:   TIA (transient ischemic attack) Active Problems:   Persistent atrial fibrillation (HCC)   Essential hypertension   Hypothyroidism   CURRENT MEDS:    aspirin EC  81 mg Oral Daily   ezetimibe  10 mg Oral Daily   furosemide  40 mg Oral Daily   gabapentin  900 mg Oral QHS   levothyroxine  112 mcg Oral Q0200   lidocaine  1 patch Transdermal QHS   metoprolol tartrate  25 mg Oral BID   niacin  100 mg Oral BID WC   pantoprazole  40 mg Oral Daily    [name] Office: [phone] 05/20/2021

## 2021-05-20 NOTE — Progress Notes (Signed)
ANTICOAGULATION CONSULT NOTE - Follow Up Consult  Pharmacy Consult for heparin Indication: atrial fibrillation  Labs: Recent Labs    05/17/21 1711 05/18/21 0059 05/19/21 0136 05/19/21 1028 05/19/21 1942 05/20/21 0211  HGB 16.7 16.0 15.8  --   --  16.1  HCT 49.4 48.0 46.8  --   --  47.8  PLT 145* 129* 123*  --   --  125*  APTT 33  --  75* 116* 105* 114*  LABPROT 13.8  --   --   --   --   --   INR 1.1  --   --   --   --   --   HEPARINUNFRC  --   --  >1.10*  --   --  0.79*  CREATININE 1.15 1.10 1.13  --   --  1.13    Assessment: 76yo male supratherapeutic on heparin after rate change; no infusion issues or signs of bleeding per RN.  Goal of Therapy:  Heparin level 0.3-0.7 units/ml   Plan:  Will decrease heparin infusion by 1 units/kg/hr to 1100 units/hr and check level in 6-8 hours.    Wynona Neat, PharmD, BCPS  05/20/2021,4:07 AM

## 2021-05-20 NOTE — Consult Note (Signed)
ANTICOAGULATION CONSULT NOTE   Pharmacy Consult for apixaban > heparin Indication: atrial fibrillation  Allergies  Allergen Reactions   Amiodarone Shortness Of Breath   Atorvastatin Other (See Comments)    Arthralgias   Dilaudid [Hydromorphone] Other (See Comments)    Shaking all over (chills/Malaise)   Hydrocodone Hives   Lisinopril Other (See Comments)    CP and SOB   Metoprolol Other (See Comments)    CP and SOb   Rosuvastatin Other (See Comments)    Myalgias   Diltiazem Rash   Rocephin [Ceftriaxone Sodium In Dextrose] Rash    Patient Measurements: Weight: 111.6 kg (246 lb) (bed weight) Heparin Dosing Weight: 101.38  Vital Signs: Temp: 97.9 F (36.6 C) (01/23 1105) Temp Source: Oral (01/23 1105) BP: 131/87 (01/23 1105) Pulse Rate: 84 (01/23 1105)  Labs: Recent Labs    05/17/21 1711 05/18/21 0059 05/19/21 0136 05/19/21 1028 05/19/21 1942 05/20/21 0211 05/20/21 1039  HGB 16.7 16.0 15.8  --   --  16.1  --   HCT 49.4 48.0 46.8  --   --  47.8  --   PLT 145* 129* 123*  --   --  125*  --   APTT 33  --  75* 116* 105* 114*  --   LABPROT 13.8  --   --   --   --   --   --   INR 1.1  --   --   --   --   --   --   HEPARINUNFRC  --   --  >1.10*  --   --  0.79* 0.54  CREATININE 1.15 1.10 1.13  --   --  1.13  --      Estimated Creatinine Clearance: 71.7 mL/min (by C-G formula based on SCr of 1.13 mg/dL).   Medical History: Past Medical History:  Diagnosis Date   Arthritis    Atrial fibrillation (HCC)    BPH (benign prostatic hypertrophy)    CAD (coronary artery disease)    a. s/p DES 05/2014 at [facility])    Complication of anesthesia    BECAME COMBATIVE   Dyspnea    Dysrhythmia    Femur fracture (HCC)    GERD (gastroesophageal reflux disease)    occasional TUMS   Gout    History of gout    History of kidney stones    Hypertension    Hypothyroidism    Persistent atrial fibrillation (HCC)    Precancerous skin lesion    Renal disorder      Medications:  Scheduled:   aspirin EC  81 mg Oral Daily   ezetimibe  10 mg Oral Daily   furosemide  40 mg Oral Daily   gabapentin  900 mg Oral QHS   levothyroxine  112 mcg Oral Q0200   lidocaine  1 patch Transdermal QHS   metoprolol tartrate  25 mg Oral BID   niacin  100 mg Oral BID WC   pantoprazole  40 mg Oral Daily   Infusions:   heparin 1,100 Units/hr (05/20/21 0756)    Assessment: Pt came to hospital complaining of episodes of partial vision loss in left eye. TIA/vascular workup shows 80% left carotid stenosis. Pt on eliquis PTA for afib and last dose was given at 0905 1/21. Vascular d'ced eliquis and pharmacy has been consulted to dose heparin.  Heparin level came back therapeutic today. We will continue with the current rate. Plan for surgery in AM.  Goal of Therapy:  Heparin level 0.3-0.7 units/ml aPPT 66-102 Monitor platelets by anticoagulation protocol: Yes   Plan:  Continue heparin infusion 1100 units/hr unitl 0700 1/24 Daily heparin level and CBC  Onnie Boer, PharmD, BCIDP, AAHIVP, CPP Infectious Disease Pharmacist 05/20/2021 12:28 PM

## 2021-05-20 NOTE — Progress Notes (Signed)
°  Transition of Care [facility]) Screening Note   Patient Details  Name: AMEN STASZAK Date of Birth: July 27, 1945   Transition of Care [facility]) CM/SW Contact:    Cyndi Bender, RN Phone Number: 05/20/2021, 9:01 AM    Transition of Care Department [facility]) has reviewed patient and no TOC needs have been identified at this time. We will continue to monitor patient advancement through interdisciplinary progression rounds. If new patient transition needs arise, please place a TOC consult.

## 2021-05-20 NOTE — Progress Notes (Signed)
PROGRESS NOTE                                                                                                                                                                                                             Patient Demographics:    Wayne Ortiz, is a 76 y.o. male, DOB - [birth year], OZH:[id number]  Outpatient Primary MD for the patient is Celene Squibb, MD    LOS - 2  Admit date - 05/17/2021    No chief complaint on file.      Brief Narrative (HPI from H&P)  -  Wayne Ortiz  is a 76 y.o. male, with history of atrial fibrillation, BPH, coronary artery disease, GERD, history of gout, hypothyroidism, and more presents ED with a chief complaint of partial vision loss.  According to the patient over the last several weeks he has had partial vision loss which is transient in his left eye, he saw an ophthalmologist and he was asked to come to the ER.  He presented to [facility], ER and was transferred to [facility] for further neurological work-up.  Initial work-up is positive for left ICA 70% lesion which likely is the cause of his vision symptoms.   Subjective:   Patient in bed, appears comfortable, denies any headache, no fever, no chest pain or pressure, no shortness of breath , no abdominal pain. No new focal weakness.    Assessment  & Plan :    No notes have been filed under this hospital service. 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  No CVA on MRI, patient to be seen by neurology and vascular surgery currently placed on aspirin, niacin due to statin allergy, heparin drip, likely undergoing left carotid endarterectomy on 05/21/20 on VVS, updated son, patient and son except moderate to high risk for adverse cardiopulmonary outcome due to advanced age and underlying PAD and A. fib, they accept the risk and want to proceed with surgery.  Will monitor  closely.    2.  Dyslipidemia.  Has listed multiple statin allergies.  Placed on Niacin + Zetia.  3.  Hypothyroidism.  On Synthroid.  4.  Hypertension.  Continue beta-blocker at home dose.  5.  Chronic pain.  Supportive care.  6.  Paroxysmal atrial  fibrillation [place] vas 2 score of greater than 3.  On beta-blocker.  Eliquis has been transitioned to heparin for upcoming surgery.  7.  Chronic diastolic CHF with preserved EF of 55%.  Echo shows no wall motion abnormality, compensated.       Condition - Extremely Guarded  Family Communication  :  Wayne Ortiz [phone] - 05/20/21  Code Status :  Full  Consults  :  Neuro, VVS  PUD Prophylaxis :    Procedures  :     MR brain orbits - 1. Normal MRI appearance of the orbits. 2. No acute intracranial abnormality. Mild to moderate for age signal changes in the cerebral white matter and basal ganglia most commonly due to chronic small vessel disease.   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  CTA - 1. No intracranial large vessel occlusion. Mild stenosis in the right cavernous and supraclinoid ICA without additional hemodynamically significant intracranial stenosis. 2. Approximately 70% stenosis in the distal left common carotid artery and at the bifurcation, without significant stenosis in the proximal left ICA. No other hemodynamically significant stenosis in the neck.      Disposition Plan  :    Status is: inpt  DVT Prophylaxis  :  Hep gtt    Lab Results  Component Value Date   PLT 125 (L) 05/20/2021    Diet :  Diet Order             Diet NPO time specified Except for: Sips with Meds  Diet effective midnight           Diet Heart Room service appropriate? Yes; Fluid consistency: Thin  Diet effective now                    Inpatient Medications  Scheduled Meds:  aspirin EC  81 mg Oral Daily   ezetimibe  10 mg Oral Daily   furosemide  40 mg Oral Daily   gabapentin  900 mg Oral QHS    levothyroxine  112 mcg Oral Q0200   lidocaine  1 patch Transdermal QHS   metoprolol tartrate  25 mg Oral BID   niacin  100 mg Oral BID WC   pantoprazole  40 mg Oral Daily   Continuous Infusions:  heparin 1,100 Units/hr (05/20/21 0756)   PRN Meds:.acetaminophen **OR** acetaminophen (TYLENOL) oral liquid 160 mg/5 mL **OR** acetaminophen, senna-docusate  Antibiotics  :    Anti-infectives (From admission, onward)    None        Time Spent in minutes  30   Lala Lund M.D on 05/20/2021 at 9:15 AM  To page go to [email or web address]   [facility] -  Office  [phone]  See all Orders from today for further details    Objective:   Vitals:   05/20/21 0034 05/20/21 0308 05/20/21 0418 05/20/21 0821  BP:    110/82  Pulse:    84  Resp:    17  Temp:    97.7 F (36.5 C)  TempSrc:    Oral  SpO2: (!) 80% (!) 82% (!) 84% 90%  Weight:        Wt Readings from Last 3 Encounters:  05/18/21 111.6 kg  03/05/21 117 kg  02/27/21 117 kg     Intake/Output Summary (Last 24 hours) at 05/20/2021 0915 Last data filed at 05/20/2021 0806 Gross per 24 hour  Intake 634.53 ml  Output --  Net 634.53 ml  Physical Exam  Awake Alert, No new F.N deficits, Normal affect [place].AT,PERRAL Supple Neck, No JVD,   Symmetrical Chest wall movement, Good air movement bilaterally, CTAB RRR,No Gallops, Rubs or new Murmurs,  +ve B.Sounds, Abd Soft, No tenderness,   No Cyanosis, Clubbing or edema       Data Review:    CBC Recent Labs  Lab 05/17/21 1711 05/18/21 0059 05/19/21 0136 05/20/21 0211  WBC 5.3 5.4 4.3 4.9  HGB 16.7 16.0 15.8 16.1  HCT 49.4 48.0 46.8 47.8  PLT 145* 129* 123* 125*  MCV 103.6* 101.1* 99.6 99.8  MCH 35.0* 33.7 33.6 33.6  MCHC 33.8 33.3 33.8 33.7  RDW 13.4 13.2 13.2 13.3  LYMPHSABS 1.6  --  1.6 2.1  MONOABS 0.5  --  0.4 0.5  EOSABS 0.0  --  0.0 0.0  BASOSABS 0.0  --  0.0 0.0    Electrolytes Recent Labs  Lab 05/17/21 1711 05/18/21 0059  05/18/21 0928 05/19/21 0136 05/20/21 0211  NA 142 140  --  141 138  K 4.0 4.2  --  3.8 4.0  CL 105 105  --  107 107  CO2 26 27  --  24 22  GLUCOSE 98 102*  --  107* 101*  BUN 21 18  --  20 20  CREATININE 1.15 1.10  --  1.13 1.13  CALCIUM 9.2 9.3  --  9.1 8.9  AST 22 19  --  17 19  ALT 20 18  --  17 17  ALKPHOS 53 51  --  54 54  BILITOT 0.5 0.5  --  0.8 0.5  ALBUMIN 4.4 4.1  --  3.9 4.0  MG  --   --   --  2.0 2.2  CRP  --   --   --  0.8  --   INR 1.1  --   --   --   --   TSH  --   --  8.129*  --   --   BNP  --   --   --  52.5 117.7*    ------------------------------------------------------------------------------------------------------------------ Recent Labs    05/18/21 0059  CHOL 192  HDL 46  LDLCALC 125*  TRIG 105  CHOLHDL 4.2    Lab Results  Component Value Date   HGBA1C 5.8 (H) 08/08/2014    Recent Labs    05/18/21 0928  TSH 8.129*   ------------------------------------------------------------------------------------------------------------------ ID Labs Recent Labs  Lab 05/17/21 1711 05/18/21 0059 05/19/21 0136 05/20/21 0211  WBC 5.3 5.4 4.3 4.9  PLT 145* 129* 123* 125*  CRP  --   --  0.8  --   CREATININE 1.15 1.10 1.13 1.13   Cardiac Enzymes No results for input(s): CKMB, TROPONINI, MYOGLOBIN in the last 168 hours.  Invalid input(s): CK   Radiology Reports CT Angio Head W or Wo Contrast  Result Date: 05/17/2021 CLINICAL DATA:  Neuro deficit, stroke suspected, visual problems EXAM: CT ANGIOGRAPHY HEAD AND NECK TECHNIQUE: Multidetector CT imaging of the head and neck was performed using the standard protocol during bolus administration of intravenous contrast. Multiplanar CT image reconstructions and MIPs were obtained to evaluate the vascular anatomy. Carotid stenosis measurements (when applicable) are obtained utilizing NASCET criteria, using the distal internal carotid diameter as the denominator. RADIATION DOSE REDUCTION: This exam was  performed according to the departmental dose-optimization program which includes automated exposure control, adjustment of the mA and/or kV according to patient size and/or use of iterative reconstruction technique. CONTRAST:  14mL  OMNIPAQUE IOHEXOL 350 MG/ML SOLN COMPARISON:  Prior CTA, correlation is made with CT head 05/17/2021 FINDINGS: CT HEAD FINDINGS For noncontrast findings, please see same day CT head. CTA NECK FINDINGS Aortic arch: Two-vessel arch with a common origin of the brachiocephalic and left common carotid arteries. Imaged portion shows no evidence of aneurysm or dissection. No significant stenosis of the major arch vessel origins. Aortic atherosclerosis Right carotid system: No evidence of dissection, stenosis (50% or greater) or occlusion. Left carotid system: Approximately 70% stenosis in the distal left common carotid and at the bifurcation (series 7, image 223) without hemodynamically significant stenosis in the proximal left ICA Vertebral arteries: Mild calcifications in the right V1 segment, which are not hemodynamically significant. The left vertebral artery is somewhat small but otherwise unremarkable. No evidence of dissection or occlusion. Skeleton: 5 mm anterolisthesis of C3 on C4, which appears degenerative and longstanding. Status post ACDF C5-T1. No acute osseous abnormality. Other neck: Negative. Upper chest: Apical pleural-parenchymal scarring. No pleural effusion. Review of the MIP images confirms the above findings CTA HEAD FINDINGS Anterior circulation: Both internal carotid arteries are patent to the termini, with calcifications in the bilateral cavernous and supraclinoid segments, which cause mild stenosis. A1 segments patent. Normal anterior communicating artery. Anterior cerebral arteries are patent to their distal aspects. No M1 stenosis or occlusion. Normal MCA bifurcations. Distal MCA branches perfused and symmetric. Posterior circulation: The left vertebral artery  primarily supplies PICA. The right vertebral artery is patent to the vertebrobasilar junction, with minimal calcifications which are not hemodynamically significant. Posterior inferior cerebral arteries patent bilaterally. Basilar patent to its distal aspect. Superior cerebellar arteries patent bilaterally. Bilateral P1 segments originate from the basilar artery. The right posterior communicating artery is visualized. The left posterior communicating artery is not definitively seen. PCAs perfused to their distal aspects without stenosis. Venous sinuses: As permitted by contrast timing, patent. Anatomic variants: None significant Review of the MIP images confirms the above findings IMPRESSION: 1. No intracranial large vessel occlusion. Mild stenosis in the right cavernous and supraclinoid ICA without additional hemodynamically significant intracranial stenosis. 2. Approximately 70% stenosis in the distal left common carotid artery and at the bifurcation, without significant stenosis in the proximal left ICA. No other hemodynamically significant stenosis in the neck. Electronically Signed   By: Merilyn Baba M.D.   On: 05/17/2021 19:38   CT HEAD WO CONTRAST  Result Date: 05/17/2021 CLINICAL DATA:  Visual problems for 2 months, most recently 2 days ago. EXAM: CT HEAD WITHOUT CONTRAST TECHNIQUE: Contiguous axial images were obtained from the base of the skull through the vertex without intravenous contrast. RADIATION DOSE REDUCTION: This exam was performed according to the departmental dose-optimization program which includes automated exposure control, adjustment of the mA and/or kV according to patient size and/or use of iterative reconstruction technique. COMPARISON:  None. FINDINGS: Brain: There is no evidence for acute hemorrhage, hydrocephalus, mass lesion, or abnormal extra-axial fluid collection. No definite CT evidence for acute infarction. Diffuse loss of parenchymal volume is consistent with atrophy.  Patchy low attenuation in the deep hemispheric and periventricular white matter is nonspecific, but likely reflects chronic microvascular ischemic demyelination. Vascular: No hyperdense vessel or unexpected calcification. Skull: No evidence for fracture. No worrisome lytic or sclerotic lesion. Sinuses/Orbits: The visualized paranasal sinuses and mastoid air cells are clear. Visualized portions of the globes and intraorbital fat are unremarkable. Other: None. IMPRESSION: 1. No acute intracranial abnormality. 2. Atrophy with chronic small vessel ischemic disease. Electronically Signed   By:  Misty Stanley M.D.   On: 05/17/2021 17:31   CT Angio Neck W and/or Wo Contrast  Result Date: 05/17/2021 CLINICAL DATA:  Neuro deficit, stroke suspected, visual problems EXAM: CT ANGIOGRAPHY HEAD AND NECK TECHNIQUE: Multidetector CT imaging of the head and neck was performed using the standard protocol during bolus administration of intravenous contrast. Multiplanar CT image reconstructions and MIPs were obtained to evaluate the vascular anatomy. Carotid stenosis measurements (when applicable) are obtained utilizing NASCET criteria, using the distal internal carotid diameter as the denominator. RADIATION DOSE REDUCTION: This exam was performed according to the departmental dose-optimization program which includes automated exposure control, adjustment of the mA and/or kV according to patient size and/or use of iterative reconstruction technique. CONTRAST:  79mL OMNIPAQUE IOHEXOL 350 MG/ML SOLN COMPARISON:  Prior CTA, correlation is made with CT head 05/17/2021 FINDINGS: CT HEAD FINDINGS For noncontrast findings, please see same day CT head. CTA NECK FINDINGS Aortic arch: Two-vessel arch with a common origin of the brachiocephalic and left common carotid arteries. Imaged portion shows no evidence of aneurysm or dissection. No significant stenosis of the major arch vessel origins. Aortic atherosclerosis Right carotid system: No  evidence of dissection, stenosis (50% or greater) or occlusion. Left carotid system: Approximately 70% stenosis in the distal left common carotid and at the bifurcation (series 7, image 223) without hemodynamically significant stenosis in the proximal left ICA Vertebral arteries: Mild calcifications in the right V1 segment, which are not hemodynamically significant. The left vertebral artery is somewhat small but otherwise unremarkable. No evidence of dissection or occlusion. Skeleton: 5 mm anterolisthesis of C3 on C4, which appears degenerative and longstanding. Status post ACDF C5-T1. No acute osseous abnormality. Other neck: Negative. Upper chest: Apical pleural-parenchymal scarring. No pleural effusion. Review of the MIP images confirms the above findings CTA HEAD FINDINGS Anterior circulation: Both internal carotid arteries are patent to the termini, with calcifications in the bilateral cavernous and supraclinoid segments, which cause mild stenosis. A1 segments patent. Normal anterior communicating artery. Anterior cerebral arteries are patent to their distal aspects. No M1 stenosis or occlusion. Normal MCA bifurcations. Distal MCA branches perfused and symmetric. Posterior circulation: The left vertebral artery primarily supplies PICA. The right vertebral artery is patent to the vertebrobasilar junction, with minimal calcifications which are not hemodynamically significant. Posterior inferior cerebral arteries patent bilaterally. Basilar patent to its distal aspect. Superior cerebellar arteries patent bilaterally. Bilateral P1 segments originate from the basilar artery. The right posterior communicating artery is visualized. The left posterior communicating artery is not definitively seen. PCAs perfused to their distal aspects without stenosis. Venous sinuses: As permitted by contrast timing, patent. Anatomic variants: None significant Review of the MIP images confirms the above findings IMPRESSION: 1. No  intracranial large vessel occlusion. Mild stenosis in the right cavernous and supraclinoid ICA without additional hemodynamically significant intracranial stenosis. 2. Approximately 70% stenosis in the distal left common carotid artery and at the bifurcation, without significant stenosis in the proximal left ICA. No other hemodynamically significant stenosis in the neck. Electronically Signed   By: Merilyn Baba M.D.   On: 05/17/2021 19:38   MR BRAIN W WO CONTRAST  Result Date: 05/18/2021 CLINICAL DATA:  76 year old male with vision problems for the past 2 months. Neurologic deficit. EXAM: MRI HEAD AND ORBITS WITHOUT AND WITH CONTRAST TECHNIQUE: Multiplanar, multiecho pulse sequences of the brain and surrounding structures were obtained without and with intravenous contrast. Multiplanar, multiecho pulse sequences of the orbits and surrounding structures were obtained including fat saturation techniques,  before and after intravenous contrast administration. CONTRAST:  63mL GADAVIST GADOBUTROL 1 MMOL/ML IV SOLN COMPARISON:  CTA head and neck and head CT yesterday. FINDINGS: MRI HEAD FINDINGS Brain: No restricted diffusion to suggest acute infarction. No midline shift, mass effect, evidence of mass lesion, ventriculomegaly, extra-axial collection or acute intracranial hemorrhage. Cervicomedullary junction and pituitary are within normal limits. Scattered patchy cerebral white matter T2 and FLAIR hyperintensity in both hemispheres, mild to moderate for age. No cortical encephalomalacia or chronic cerebral blood products identified. 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 Mild T2 heterogeneity in the bilateral basal ganglia. Thalami, brainstem and cerebellum are within normal limits. No abnormal enhancement identified. No dural thickening. Vascular: Major intracranial vascular flow voids are preserved. Dominant right vertebral artery as seen on CTA. The major dural venous sinuses are  enhancing and appear patent. Skull and upper cervical spine: Negative visible cervical spine. Visualized bone marrow signal is within normal limits. Other: Right anterior scalp susceptibility artifact related to punctate metallic foreign body retained as seen on recent head CT. But otherwise negative visible scalp and face soft tissues. Visible internal auditory structures appear normal. MRI ORBITS FINDINGS Orbits: Normal suprasellar cistern and optic chiasm. Cavernous sinus appears symmetric and normal. Bilateral optic nerves appear symmetric and within normal limits. No intraorbital mass or inflammation. No abnormal intraorbital enhancement identified. Postoperative changes to both globes which otherwise appear normal. Visualized sinuses: Negative. Soft tissues: Negative periorbital and visible deep soft tissue spaces of the face. IMPRESSION: 1. Normal MRI appearance of the orbits. 2. No acute intracranial abnormality. Mild to moderate for age signal changes in the cerebral white matter and basal ganglia most commonly due to chronic small vessel disease. Electronically Signed   By: Genevie Ann M.D.   On: 05/18/2021 07:09   ECHOCARDIOGRAM COMPLETE  Result Date: 05/18/2021    ECHOCARDIOGRAM REPORT   Patient Name:   Wayne Ortiz Date of Exam: 05/18/2021 Medical Rec #:  [id number]     Height:       72.0 in Accession #:    [id number]    Weight:       257.9 lb Date of Birth:  1945-09-30      BSA:          2.374 m Patient Age:    45 years      BP:           118/91 mmHg Patient Gender: M             HR:           91 bpm. Exam Location:  Inpatient Procedure: 2D Echo, Cardiac Doppler and Color Doppler Indications:    TIA  History:        Patient has prior history of Echocardiogram examinations, most                 recent 12/01/2014. CAD, Arrythmias:Atrial Fibrillation,                 Signs/Symptoms:Dyspnea; Risk Factors:Hypertension. CA/                 DYSRHYTHMIA.  Sonographer:    Beryle Beams Referring Phys: [id number] ASIA  B [place] IMPRESSIONS  1. Left ventricular ejection fraction, by estimation, is 55%. The left ventricle has normal function. The left ventricle has no regional wall motion abnormalities. There is mild left ventricular hypertrophy. Left ventricular diastolic parameters are consistent with Grade I diastolic dysfunction (impaired relaxation).  2. Right  ventricular systolic function is moderately reduced. The right ventricular size is moderately enlarged. There is normal pulmonary artery systolic pressure.  3. The mitral valve is abnormal. Trivial mitral valve regurgitation. No evidence of mitral stenosis.  4. The aortic valve is tricuspid. There is mild calcification of the aortic valve. Aortic valve regurgitation is not visualized. Aortic valve sclerosis is present, with no evidence of aortic valve stenosis.  5. The inferior vena cava is normal in size with greater than 50% respiratory variability, suggesting right atrial pressure of 3 mmHg. FINDINGS  Left Ventricle: Left ventricular ejection fraction, by estimation, is 55%. The left ventricle has normal function. The left ventricle has no regional wall motion abnormalities. The left ventricular internal cavity size was normal in size. There is mild left ventricular hypertrophy. Left ventricular diastolic parameters are consistent with Grade I diastolic dysfunction (impaired relaxation). Right Ventricle: The right ventricular size is moderately enlarged. No increase in right ventricular wall thickness. Right ventricular systolic function is moderately reduced. There is normal pulmonary artery systolic pressure. The tricuspid regurgitant velocity is 2.74 m/s, and with an assumed right atrial pressure of 3 mmHg, the estimated right ventricular systolic pressure is 14.4 mmHg. Left Atrium: Left atrial size was normal in size. Right Atrium: Right atrial size was normal in size. Pericardium: There is no evidence of pericardial effusion. Mitral Valve: The mitral valve  is abnormal. There is mild thickening of the mitral valve leaflet(s). There is mild calcification of the mitral valve leaflet(s). Trivial mitral valve regurgitation. No evidence of mitral valve stenosis. MV peak gradient, 35.3 mmHg. The mean mitral valve gradient is 26.0 mmHg. Tricuspid Valve: The tricuspid valve is normal in structure. Tricuspid valve regurgitation is mild . No evidence of tricuspid stenosis. Aortic Valve: The aortic valve is tricuspid. There is mild calcification of the aortic valve. Aortic valve regurgitation is not visualized. Aortic valve sclerosis is present, with no evidence of aortic valve stenosis. Aortic valve mean gradient measures 2.0 mmHg. Aortic valve peak gradient measures 3.0 mmHg. Aortic valve area, by VTI measures 1.92 cm. Pulmonic Valve: The pulmonic valve was normal in structure. Pulmonic valve regurgitation is not visualized. No evidence of pulmonic stenosis. Aorta: The aortic root is normal in size and structure. Venous: The inferior vena cava is normal in size with greater than 50% respiratory variability, suggesting right atrial pressure of 3 mmHg. IAS/Shunts: No atrial level shunt detected by color flow Doppler.  LEFT VENTRICLE PLAX 2D LVIDd:         4.00 cm     Diastology LVIDs:         2.00 cm     LV e' medial:    7.58 cm/s LV PW:         1.30 cm     LV E/e' medial:  7.1 LV IVS:        1.20 cm     LV e' lateral:   6.34 cm/s LVOT diam:     2.10 cm     LV E/e' lateral: 8.5 LV SV:         28 LV SV Index:   12 LVOT Area:     3.46 cm  LV Volumes (MOD) LV vol d, MOD A2C: 51.0 ml LV vol d, MOD A4C: 79.8 ml LV vol s, MOD A4C: 41.6 ml LV SV MOD A4C:     79.8 ml RIGHT VENTRICLE RV S prime:     16.20 cm/s TAPSE (M-mode): 2.0 cm LEFT ATRIUM  Index        RIGHT ATRIUM           Index LA diam:        3.50 cm 1.47 cm/m   RA Area:     11.90 cm LA Vol (A2C):   59.4 ml 25.02 ml/m  RA Volume:   21.30 ml  8.97 ml/m LA Vol (A4C):   45.0 ml 18.96 ml/m LA Biplane Vol: 57.3 ml  24.14 ml/m  AORTIC VALVE                    PULMONIC VALVE AV Area (Vmax):    2.07 cm     PV Vmax:       0.40 m/s AV Area (Vmean):   1.88 cm     PV Vmean:      22.500 cm/s AV Area (VTI):     1.92 cm     PV VTI:        0.043 m AV Vmax:           87.30 cm/s   PV Peak grad:  0.7 mmHg AV Vmean:          60.100 cm/s  PV Mean grad:  0.0 mmHg AV VTI:            0.147 m AV Peak Grad:      3.0 mmHg AV Mean Grad:      2.0 mmHg LVOT Vmax:         52.30 cm/s LVOT Vmean:        32.600 cm/s LVOT VTI:          0.081 m LVOT/AV VTI ratio: 0.55  AORTA Ao Root diam: 3.70 cm Ao Asc diam:  3.50 cm MITRAL VALVE               TRICUSPID VALVE MV Area (PHT): 3.50 cm    TV Peak grad:   28.5 mmHg MV Area VTI:   0.31 cm    TV Mean grad:   21.0 mmHg MV Peak grad:  35.3 mmHg   TV Vmax:        2.67 m/s MV Mean grad:  26.0 mmHg   TV Vmean:       216.0 cm/s MV Vmax:       2.97 m/s    TV VTI:         0.74 msec MV Vmean:      246.0 cm/s  TR Peak grad:   30.0 mmHg MV Decel Time: 217 msec    TR Vmax:        274.00 cm/s MV E velocity: 54.00 cm/s MV A velocity: 30.40 cm/s  SHUNTS MV E/A ratio:  1.78        Systemic VTI:  0.08 m                            Systemic Diam: 2.10 cm Jenkins Rouge MD Electronically signed by Jenkins Rouge MD Signature Date/Time: 05/18/2021/12:59:55 PM    Final    VAS US CAROTID  Result Date: 05/18/2021 Carotid Arterial Duplex Study Patient Name:  Wayne Ortiz  Date of Exam:   05/18/2021 Medical Rec #: [id number]      Accession #:    [id number] Date of Birth: Feb 17, 1946       Patient Gender: M Patient Age:   68 years Exam Location:  [facility] [place] Procedure:      VAS US  CAROTID Referring Phys: Harrell Gave DICKSON --------------------------------------------------------------------------------  Indications:       Visual disturbance; three episodes of left amaurosis fugax. Risk Factors:      Hypertension, past history of smoking, coronary artery                    disease. Comparison Study:  No prior study Performing  Technologist: Maudry Mayhew MHA, RDMS, RVT, RDCS  Examination Guidelines: A complete evaluation includes B-mode imaging, spectral Doppler, color Doppler, and power Doppler as needed of all accessible portions of each vessel. Bilateral testing is considered an integral part of a complete examination. Limited examinations for reoccurring indications may be performed as noted.  Right Carotid Findings: +----------+--------+--------+--------+--------------------------+--------+             PSV cm/s EDV cm/s Stenosis Plaque Description         Comments  +----------+--------+--------+--------+--------------------------+--------+  CCA Prox   78       21                                                     +----------+--------+--------+--------+--------------------------+--------+  CCA Distal 71       21                irregular and heterogenous           +----------+--------+--------+--------+--------------------------+--------+  ICA Prox   42       12                calcific and smooth                  +----------+--------+--------+--------+--------------------------+--------+  ICA Distal 62       27                                                     +----------+--------+--------+--------+--------------------------+--------+  ECA        83       18                heterogenous and irregular           +----------+--------+--------+--------+--------------------------+--------+ +----------+--------+-------+----------------+-------------------+             PSV cm/s EDV cms Describe         Arm Pressure (mmHG)  +----------+--------+-------+----------------+-------------------+  Subclavian 72               Multiphasic, WNL                      +----------+--------+-------+----------------+-------------------+ +---------+--------+--+--------+--+---------+  Vertebral PSV cm/s 23 EDV cm/s 10 Antegrade  +---------+--------+--+--------+--+---------+  Left Carotid Findings:  +----------+--------+--------+--------+-------------------------+---------+             PSV cm/s EDV cm/s Stenosis Plaque Description        Comments   +----------+--------+--------+--------+-------------------------+---------+  CCA Prox   65       22                smooth and heterogenous              +----------+--------+--------+--------+-------------------------+---------+  CCA Distal 78       23       <50%  calcific and focal        Shadowing  +----------+--------+--------+--------+-------------------------+---------+  ICA Prox   36       14       1-39%    heterogenous and calcific Shadowing  +----------+--------+--------+--------+-------------------------+---------+  ICA Mid    55       29                                                     +----------+--------+--------+--------+-------------------------+---------+  ICA Distal 68       31                                                     +----------+--------+--------+--------+-------------------------+---------+  ECA        184      43                                                     +----------+--------+--------+--------+-------------------------+---------+ +----------+--------+--------+----------------+-------------------+             PSV cm/s EDV cm/s Describe         Arm Pressure (mmHG)  +----------+--------+--------+----------------+-------------------+  Subclavian 87                Multiphasic, WNL                      +----------+--------+--------+----------------+-------------------+ +---------+--------+--+--------+-+---------+  Vertebral PSV cm/s 15 EDV cm/s 5 Antegrade  +---------+--------+--+--------+-+---------+   Summary: Right Carotid: Velocities in the right ICA are consistent with a 1-39% stenosis.                Non-hemodynamically significant plaque <50% noted in the CCA. Left Carotid: Velocities in the left ICA are consistent with a 1-39% stenosis.               Non-hemodynamically significant plaque <50% noted in the CCA.  Vertebrals:  Bilateral vertebral arteries demonstrate antegrade flow. Subclavians: Normal flow hemodynamics were seen in bilateral subclavian              arteries. *See table(s) above for measurements and observations.  Electronically signed by [name] MD on 05/18/2021 at 12:08:33 PM.    Final    MR ORBITS W WO CONTRAST  Result Date: 05/18/2021 CLINICAL DATA:  76 year old male with vision problems for the past 2 months. Neurologic deficit. EXAM: MRI HEAD AND ORBITS WITHOUT AND WITH CONTRAST TECHNIQUE: Multiplanar, multiecho pulse sequences of the brain and surrounding structures were obtained without and with intravenous contrast. Multiplanar, multiecho pulse sequences of the orbits and surrounding structures were obtained including fat saturation techniques, before and after intravenous contrast administration. CONTRAST:  89mL GADAVIST GADOBUTROL 1 MMOL/ML IV SOLN COMPARISON:  CTA head and neck and head CT yesterday. FINDINGS: MRI HEAD FINDINGS Brain: No restricted diffusion to suggest acute infarction. No midline shift, mass effect, evidence of mass lesion, ventriculomegaly, extra-axial collection or acute intracranial hemorrhage. Cervicomedullary junction and pituitary are within normal limits. Scattered patchy cerebral white matter T2 and FLAIR hyperintensity in both hemispheres, mild  to moderate for age. No cortical encephalomalacia or chronic cerebral blood products identified. 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 Mild T2 heterogeneity in the bilateral basal ganglia. Thalami, brainstem and cerebellum are within normal limits. No abnormal enhancement identified. No dural thickening. Vascular: Major intracranial vascular flow voids are preserved. Dominant right vertebral artery as seen on CTA. The major dural venous sinuses are enhancing and appear patent. Skull and upper cervical spine: Negative visible cervical spine. Visualized bone marrow signal is within normal  limits. Other: Right anterior scalp susceptibility artifact related to punctate metallic foreign body retained as seen on recent head CT. But otherwise negative visible scalp and face soft tissues. Visible internal auditory structures appear normal. MRI ORBITS FINDINGS Orbits: Normal suprasellar cistern and optic chiasm. Cavernous sinus appears symmetric and normal. Bilateral optic nerves appear symmetric and within normal limits. No intraorbital mass or inflammation. No abnormal intraorbital enhancement identified. Postoperative changes to both globes which otherwise appear normal. Visualized sinuses: Negative. Soft tissues: Negative periorbital and visible deep soft tissue spaces of the face. IMPRESSION: 1. Normal MRI appearance of the orbits. 2. No acute intracranial abnormality. Mild to moderate for age signal changes in the cerebral white matter and basal ganglia most commonly due to chronic small vessel disease. Electronically Signed   By: Genevie Ann M.D.   On: 05/18/2021 07:09

## 2021-05-21 ENCOUNTER — Inpatient Hospital Stay (HOSPITAL_COMMUNITY): Payer: Medicare HMO

## 2021-05-21 ENCOUNTER — Encounter (HOSPITAL_COMMUNITY)
Admission: EM | Disposition: A | Discharge status: Home / Self Care | Payer: Self-pay | Source: Home / Self Care | Attending: Internal Medicine

## 2021-05-21 ENCOUNTER — Encounter (HOSPITAL_COMMUNITY): Payer: Self-pay | Admitting: Family Medicine

## 2021-05-21 ENCOUNTER — Inpatient Hospital Stay (HOSPITAL_COMMUNITY): Payer: Medicare HMO | Admitting: Certified Registered"

## 2021-05-21 ENCOUNTER — Other Ambulatory Visit: Payer: Self-pay

## 2021-05-21 DIAGNOSIS — Z9861 Coronary angioplasty status: Secondary | ICD-10-CM

## 2021-05-21 DIAGNOSIS — I6522 Occlusion and stenosis of left carotid artery: Secondary | ICD-10-CM | POA: Diagnosis not present

## 2021-05-21 DIAGNOSIS — R0609 Other forms of dyspnea: Secondary | ICD-10-CM | POA: Diagnosis not present

## 2021-05-21 DIAGNOSIS — I2510 Atherosclerotic heart disease of native coronary artery without angina pectoris: Secondary | ICD-10-CM | POA: Diagnosis not present

## 2021-05-21 DIAGNOSIS — R579 Shock, unspecified: Secondary | ICD-10-CM | POA: Diagnosis not present

## 2021-05-21 DIAGNOSIS — Z9889 Other specified postprocedural states: Secondary | ICD-10-CM | POA: Diagnosis not present

## 2021-05-21 DIAGNOSIS — G459 Transient cerebral ischemic attack, unspecified: Secondary | ICD-10-CM

## 2021-05-21 DIAGNOSIS — I4819 Other persistent atrial fibrillation: Secondary | ICD-10-CM | POA: Diagnosis not present

## 2021-05-21 DIAGNOSIS — J84112 Idiopathic pulmonary fibrosis: Secondary | ICD-10-CM | POA: Diagnosis present

## 2021-05-21 HISTORY — PX: ENDARTERECTOMY: SHX5162

## 2021-05-21 HISTORY — PX: PATCH ANGIOPLASTY: SHX6230

## 2021-05-21 LAB — CBC WITH DIFFERENTIAL/PLATELET
Abs Immature Granulocytes: 0.01 10*3/uL (ref 0.00–0.07)
Basophils Absolute: 0 10*3/uL (ref 0.0–0.1)
Basophils Relative: 1 %
Eosinophils Absolute: 0 10*3/uL (ref 0.0–0.5)
Eosinophils Relative: 0 %
HCT: 47.1 % (ref 39.0–52.0)
Hemoglobin: 16 g/dL (ref 13.0–17.0)
Immature Granulocytes: 0 %
Lymphocytes Relative: 36 %
Lymphs Abs: 1.7 10*3/uL (ref 0.7–4.0)
MCH: 33.8 pg (ref 26.0–34.0)
MCHC: 34 g/dL (ref 30.0–36.0)
MCV: 99.6 fL (ref 80.0–100.0)
Monocytes Absolute: 0.5 10*3/uL (ref 0.1–1.0)
Monocytes Relative: 10 %
Neutro Abs: 2.5 10*3/uL (ref 1.7–7.7)
Neutrophils Relative %: 53 %
Platelets: 124 10*3/uL — ABNORMAL LOW (ref 150–400)
RBC: 4.73 MIL/uL (ref 4.22–5.81)
RDW: 13.3 % (ref 11.5–15.5)
WBC: 4.8 10*3/uL (ref 4.0–10.5)
nRBC: 0 % (ref 0.0–0.2)

## 2021-05-21 LAB — CBC
HCT: 44.3 % (ref 39.0–52.0)
Hemoglobin: 13.8 g/dL (ref 13.0–17.0)
MCH: 33.8 pg (ref 26.0–34.0)
MCHC: 31.2 g/dL (ref 30.0–36.0)
MCV: 108.6 fL — ABNORMAL HIGH (ref 80.0–100.0)
Platelets: 145 10*3/uL — ABNORMAL LOW (ref 150–400)
RBC: 4.08 MIL/uL — ABNORMAL LOW (ref 4.22–5.81)
RDW: 13.6 % (ref 11.5–15.5)
WBC: 8.2 10*3/uL (ref 4.0–10.5)
nRBC: 0.2 % (ref 0.0–0.2)

## 2021-05-21 LAB — COMPREHENSIVE METABOLIC PANEL
ALT: 20 U/L (ref 0–44)
ALT: 27 U/L (ref 0–44)
AST: 20 U/L (ref 15–41)
AST: 37 U/L (ref 15–41)
Albumin: 3.8 g/dL (ref 3.5–5.0)
Albumin: 3.6 g/dL (ref 3.5–5.0)
Alkaline Phosphatase: 49 U/L (ref 38–126)
Alkaline Phosphatase: 36 U/L — ABNORMAL LOW (ref 38–126)
Anion gap: 8 (ref 5–15)
Anion gap: 10 (ref 5–15)
BUN: 16 mg/dL (ref 8–23)
BUN: 18 mg/dL (ref 8–23)
CO2: 25 mmol/L (ref 22–32)
CO2: 20 mmol/L — ABNORMAL LOW (ref 22–32)
Calcium: 8.7 mg/dL — ABNORMAL LOW (ref 8.9–10.3)
Calcium: 7.7 mg/dL — ABNORMAL LOW (ref 8.9–10.3)
Chloride: 106 mmol/L (ref 98–111)
Chloride: 107 mmol/L (ref 98–111)
Creatinine, Ser: 1.2 mg/dL (ref 0.61–1.24)
Creatinine, Ser: 1.19 mg/dL (ref 0.61–1.24)
GFR, Estimated: >60 mL/min (ref >60)
GFR, Estimated: >60 mL/min (ref >60)
Glucose, Bld: 95 mg/dL (ref 70–99)
Glucose, Bld: 190 mg/dL — ABNORMAL HIGH (ref 70–99)
Potassium: 3.8 mmol/L (ref 3.5–5.1)
Potassium: 4.4 mmol/L (ref 3.5–5.1)
Sodium: 139 mmol/L (ref 135–145)
Sodium: 137 mmol/L (ref 135–145)
Total Bilirubin: 0.6 mg/dL (ref 0.3–1.2)
Total Bilirubin: 1 mg/dL (ref 0.3–1.2)
Total Protein: 6.4 g/dL — ABNORMAL LOW (ref 6.5–8.1)
Total Protein: 5.4 g/dL — ABNORMAL LOW (ref 6.5–8.1)

## 2021-05-21 LAB — LACTIC ACID, PLASMA
Lactic Acid, Venous: 2 mmol/L (ref 0.5–1.9)
Lactic Acid, Venous: 2.7 mmol/L (ref 0.5–1.9)

## 2021-05-21 LAB — MAGNESIUM: Magnesium: 2.2 mg/dL (ref 1.7–2.4)

## 2021-05-21 LAB — POCT ACTIVATED CLOTTING TIME
Activated Clotting Time: 377 seconds
Activated Clotting Time: 281 seconds
Activated Clotting Time: 137 seconds
Activated Clotting Time: 239 seconds

## 2021-05-21 LAB — BLOOD GAS, ARTERIAL
Acid-base deficit: 4 mmol/L — ABNORMAL HIGH (ref 0.0–2.0)
Bicarbonate: 21.4 mmol/L (ref 20.0–28.0)
FIO2: 32
O2 Saturation: 96.3 %
Patient temperature: 36.7
pCO2 arterial: 44.5 mmHg (ref 32.0–48.0)
pH, Arterial: 7.302 — ABNORMAL LOW (ref 7.350–7.450)
pO2, Arterial: 91.1 mmHg (ref 83.0–108.0)

## 2021-05-21 LAB — HEPARIN LEVEL (UNFRACTIONATED): Heparin Unfractionated: 0.39 IU/mL (ref 0.30–0.70)

## 2021-05-21 LAB — TROPONIN I (HIGH SENSITIVITY)
Troponin I (High Sensitivity): 6 ng/L (ref <18)
Troponin I (High Sensitivity): 5 ng/L (ref <18)

## 2021-05-21 LAB — BRAIN NATRIURETIC PEPTIDE: B Natriuretic Peptide: 54 pg/mL (ref 0.0–100.0)

## 2021-05-21 LAB — ECHOCARDIOGRAM LIMITED
Height: 72 in
S' Lateral: 1.7 cm
Weight: 3936.53 oz

## 2021-05-21 LAB — TSH: TSH: 10.423 u[IU]/mL — ABNORMAL HIGH (ref 0.350–4.500)

## 2021-05-21 LAB — T4, FREE: Free T4: 0.77 ng/dL (ref 0.61–1.12)

## 2021-05-21 SURGERY — ENDARTERECTOMY, CAROTID
Anesthesia: General | Site: Neck | Laterality: Left

## 2021-05-21 MED ORDER — ONDANSETRON HCL 4 MG/2ML IJ SOLN
INTRAMUSCULAR | Status: AC
  Filled 2021-05-21: qty 2

## 2021-05-21 MED ORDER — SODIUM BICARBONATE 8.4 % IV SOLN
INTRAVENOUS | Status: AC
  Filled 2021-05-21: qty 50

## 2021-05-21 MED ORDER — ALBUMIN HUMAN 5 % IV SOLN
INTRAVENOUS | Status: AC
  Filled 2021-05-21: qty 250

## 2021-05-21 MED ORDER — ALBUMIN HUMAN 5 % IV SOLN
12.5000 g | Freq: Once | INTRAVENOUS | Status: AC
  Administered 2021-05-21: 11:00:00 12.5 g via INTRAVENOUS

## 2021-05-21 MED ORDER — LIDOCAINE HCL (PF) 1 % IJ SOLN
INTRAMUSCULAR | Status: AC
  Filled 2021-05-21: qty 30

## 2021-05-21 MED ORDER — LIDOCAINE 2% (20 MG/ML) 5 ML SYRINGE
INTRAMUSCULAR | Status: AC
  Filled 2021-05-21: qty 5

## 2021-05-21 MED ORDER — DEXTROSE 5 % IV SOLN
INTRAVENOUS | Status: AC
  Filled 2021-05-21: qty 1000

## 2021-05-21 MED ORDER — SODIUM CHLORIDE 0.9 % IV SOLN: INTRAVENOUS | Status: DC

## 2021-05-21 MED ORDER — DEXTROSE 5 % IV SOLN
INTRAVENOUS | Status: AC | PRN
  Administered 2021-05-21: 1

## 2021-05-21 MED ORDER — HYDRALAZINE HCL 20 MG/ML IJ SOLN: 5.0000 mg | INTRAMUSCULAR | Status: DC | PRN

## 2021-05-21 MED ORDER — IOHEXOL 350 MG/ML SOLN
70.0000 mL | Freq: Once | INTRAVENOUS | Status: AC | PRN
  Administered 2021-05-21: 16:00:00 70 mL via INTRAVENOUS

## 2021-05-21 MED ORDER — NOREPINEPHRINE 4 MG/250ML-% IV SOLN
0.0000 ug/min | INTRAVENOUS | Status: DC
  Administered 2021-05-21: 16:00:00 30 ug/min via INTRAVENOUS
  Administered 2021-05-21: 13:00:00 2 ug/min via INTRAVENOUS
  Filled 2021-05-21 (×2): qty 250

## 2021-05-21 MED ORDER — SODIUM BICARBONATE 8.4 % IV SOLN
50.0000 meq | Freq: Once | INTRAVENOUS | Status: AC
  Administered 2021-05-21: 13:00:00 50 meq via INTRAVENOUS

## 2021-05-21 MED ORDER — HEPARIN SODIUM (PORCINE) 1000 UNIT/ML IJ SOLN
INTRAMUSCULAR | Status: AC
  Filled 2021-05-21: qty 10

## 2021-05-21 MED ORDER — FENTANYL CITRATE (PF) 250 MCG/5ML IJ SOLN
INTRAMUSCULAR | Status: AC
  Filled 2021-05-21: qty 5

## 2021-05-21 MED ORDER — METHYLPREDNISOLONE SODIUM SUCC 125 MG IJ SOLR
125.0000 mg | Freq: Once | INTRAMUSCULAR | Status: AC
  Administered 2021-05-21: 12:00:00 125 mg via INTRAVENOUS

## 2021-05-21 MED ORDER — DEXAMETHASONE SODIUM PHOSPHATE 10 MG/ML IJ SOLN
INTRAMUSCULAR | Status: AC
  Filled 2021-05-21: qty 1

## 2021-05-21 MED ORDER — MAGNESIUM SULFATE 2 GM/50ML IV SOLN: 2.0000 g | Freq: Every day | INTRAVENOUS | Status: DC | PRN

## 2021-05-21 MED ORDER — SODIUM CHLORIDE 0.9 % IV SOLN: 250.0000 mL | INTRAVENOUS | Status: DC

## 2021-05-21 MED ORDER — HEPARIN (PORCINE) 25000 UT/250ML-% IV SOLN
1300.0000 [IU]/h | INTRAVENOUS | Status: AC
  Administered 2021-05-21: 21:00:00 1100 [IU]/h via INTRAVENOUS
  Filled 2021-05-21: qty 250

## 2021-05-21 MED ORDER — PHENYLEPHRINE HCL-NACL 20-0.9 MG/250ML-% IV SOLN
INTRAVENOUS | Status: DC | PRN
  Administered 2021-05-21: 25 ug/min via INTRAVENOUS

## 2021-05-21 MED ORDER — ALUM & MAG HYDROXIDE-SIMETH 200-200-20 MG/5ML PO SUSP: 15.0000 mL | ORAL | Status: DC | PRN

## 2021-05-21 MED ORDER — CHLORHEXIDINE GLUCONATE CLOTH 2 % EX PADS
6.0000 | MEDICATED_PAD | Freq: Every day | CUTANEOUS | Status: DC
  Administered 2021-05-21 – 2021-05-23 (×2): 6 via TOPICAL

## 2021-05-21 MED ORDER — DEXAMETHASONE SODIUM PHOSPHATE 10 MG/ML IJ SOLN
INTRAMUSCULAR | Status: DC | PRN
Start: 2021-05-21 — End: 2021-05-21
  Administered 2021-05-21: 5 mg via INTRAVENOUS

## 2021-05-21 MED ORDER — PHENOL 1.4 % MT LIQD
1.0000 | OROMUCOSAL | Status: DC | PRN
  Administered 2021-05-22: 21:00:00 1 via OROMUCOSAL
  Filled 2021-05-21: qty 177

## 2021-05-21 MED ORDER — POTASSIUM CHLORIDE CRYS ER 20 MEQ PO TBCR: 20.0000 meq | EXTENDED_RELEASE_TABLET | Freq: Every day | ORAL | Status: DC | PRN

## 2021-05-21 MED ORDER — HEPARIN 6000 UNIT IRRIGATION SOLUTION
Status: DC | PRN
  Administered 2021-05-21: 1

## 2021-05-21 MED ORDER — ONDANSETRON HCL 4 MG/2ML IJ SOLN: 4.0000 mg | Freq: Four times a day (QID) | INTRAMUSCULAR | Status: DC | PRN

## 2021-05-21 MED ORDER — NOREPINEPHRINE 4 MG/250ML-% IV SOLN
2.0000 ug/min | INTRAVENOUS | Status: DC
  Filled 2021-05-21: qty 250

## 2021-05-21 MED ORDER — LIDOCAINE-EPINEPHRINE (PF) 1 %-1:200000 IJ SOLN
INTRAMUSCULAR | Status: AC
  Filled 2021-05-21: qty 30

## 2021-05-21 MED ORDER — PROPOFOL 10 MG/ML IV BOLUS
INTRAVENOUS | Status: DC | PRN
  Administered 2021-05-21: 120 mg via INTRAVENOUS

## 2021-05-21 MED ORDER — ROCURONIUM BROMIDE 10 MG/ML (PF) SYRINGE
PREFILLED_SYRINGE | INTRAVENOUS | Status: DC | PRN
  Administered 2021-05-21: 100 mg via INTRAVENOUS

## 2021-05-21 MED ORDER — SODIUM CHLORIDE 0.9 % IV SOLN: 500.0000 mL | Freq: Once | INTRAVENOUS | Status: DC | PRN

## 2021-05-21 MED ORDER — DIPHENHYDRAMINE HCL 50 MG/ML IJ SOLN
25.0000 mg | Freq: Once | INTRAMUSCULAR | Status: AC
  Administered 2021-05-21: 21:00:00 25 mg via INTRAVENOUS
  Filled 2021-05-21: qty 1

## 2021-05-21 MED ORDER — OXYCODONE-ACETAMINOPHEN 5-325 MG PO TABS
1.0000 | ORAL_TABLET | ORAL | Status: DC | PRN
  Administered 2021-05-22 – 2021-05-23 (×2): 2 via ORAL
  Filled 2021-05-21 (×2): qty 2

## 2021-05-21 MED ORDER — PROTAMINE SULFATE 10 MG/ML IV SOLN
INTRAVENOUS | Status: DC | PRN
  Administered 2021-05-21: 50 mg via INTRAVENOUS

## 2021-05-21 MED ORDER — LIDOCAINE-EPINEPHRINE (PF) 1 %-1:200000 IJ SOLN
INTRAMUSCULAR | Status: DC | PRN
  Administered 2021-05-21: 10 mL

## 2021-05-21 MED ORDER — AMISULPRIDE (ANTIEMETIC) 5 MG/2ML IV SOLN
10.0000 mg | Freq: Once | INTRAVENOUS | Status: AC | PRN
  Administered 2021-05-21: 12:00:00 10 mg via INTRAVENOUS

## 2021-05-21 MED ORDER — DOCUSATE SODIUM 100 MG PO CAPS
100.0000 mg | ORAL_CAPSULE | Freq: Every day | ORAL | Status: DC
  Administered 2021-05-22 – 2021-05-23 (×2): 100 mg via ORAL
  Filled 2021-05-21 (×2): qty 1

## 2021-05-21 MED ORDER — HEPARIN 6000 UNIT IRRIGATION SOLUTION
Status: AC
  Filled 2021-05-21: qty 500

## 2021-05-21 MED ORDER — PROPOFOL 10 MG/ML IV BOLUS
INTRAVENOUS | Status: AC
  Filled 2021-05-21: qty 20

## 2021-05-21 MED ORDER — PHENYLEPHRINE 40 MCG/ML (10ML) SYRINGE FOR IV PUSH (FOR BLOOD PRESSURE SUPPORT)
PREFILLED_SYRINGE | INTRAVENOUS | Status: AC
  Filled 2021-05-21: qty 10

## 2021-05-21 MED ORDER — PHENYLEPHRINE HCL-NACL 20-0.9 MG/250ML-% IV SOLN
0.0000 ug/min | INTRAVENOUS | Status: DC
  Administered 2021-05-21: 16:00:00 30 ug/min via INTRAVENOUS

## 2021-05-21 MED ORDER — THROMBIN (RECOMBINANT) 20000 UNITS EX SOLR
CUTANEOUS | Status: AC
  Filled 2021-05-21: qty 20000

## 2021-05-21 MED ORDER — ONDANSETRON HCL 4 MG/2ML IJ SOLN
INTRAMUSCULAR | Status: DC | PRN
Start: 2021-05-21 — End: 2021-05-21
  Administered 2021-05-21: 4 mg via INTRAVENOUS

## 2021-05-21 MED ORDER — HEPARIN SODIUM (PORCINE) 1000 UNIT/ML IJ SOLN
INTRAMUSCULAR | Status: DC | PRN
  Administered 2021-05-21: 11000 [IU] via INTRAVENOUS

## 2021-05-21 MED ORDER — LACTATED RINGERS IV BOLUS: 1000.0000 mL | Freq: Once | INTRAVENOUS | Status: DC

## 2021-05-21 MED ORDER — HYDRALAZINE HCL 20 MG/ML IJ SOLN: 10.0000 mg | Freq: Four times a day (QID) | INTRAMUSCULAR | Status: DC | PRN

## 2021-05-21 MED ORDER — MORPHINE SULFATE (PF) 2 MG/ML IV SOLN: 2.0000 mg | INTRAVENOUS | Status: DC | PRN

## 2021-05-21 MED ORDER — LIDOCAINE 2% (20 MG/ML) 5 ML SYRINGE
INTRAMUSCULAR | Status: DC | PRN
  Administered 2021-05-21: 100 mg via INTRAVENOUS

## 2021-05-21 MED ORDER — DIPHENHYDRAMINE HCL 50 MG/ML IJ SOLN
25.0000 mg | Freq: Four times a day (QID) | INTRAMUSCULAR | Status: DC | PRN
  Administered 2021-05-21: 12:00:00 25 mg via INTRAVENOUS

## 2021-05-21 MED ORDER — ONDANSETRON HCL 4 MG/2ML IJ SOLN
4.0000 mg | Freq: Once | INTRAMUSCULAR | Status: AC
  Administered 2021-05-21: 12:00:00 4 mg via INTRAVENOUS

## 2021-05-21 MED ORDER — 0.9 % SODIUM CHLORIDE (POUR BTL) OPTIME
TOPICAL | Status: DC | PRN
  Administered 2021-05-21 (×2): 1000 mL

## 2021-05-21 MED ORDER — METOPROLOL TARTRATE 5 MG/5ML IV SOLN: 5.0000 mg | Freq: Three times a day (TID) | INTRAVENOUS | Status: DC | PRN

## 2021-05-21 MED ORDER — FENTANYL CITRATE (PF) 100 MCG/2ML IJ SOLN
INTRAMUSCULAR | Status: DC | PRN
  Administered 2021-05-21: 25 ug via INTRAVENOUS
  Administered 2021-05-21: 100 ug via INTRAVENOUS

## 2021-05-21 MED ORDER — PROMETHAZINE HCL 25 MG/ML IJ SOLN: 6.2500 mg | INTRAMUSCULAR | Status: DC | PRN

## 2021-05-21 MED ORDER — ROCURONIUM BROMIDE 10 MG/ML (PF) SYRINGE
PREFILLED_SYRINGE | INTRAVENOUS | Status: AC
  Filled 2021-05-21: qty 10

## 2021-05-21 MED ORDER — AMISULPRIDE (ANTIEMETIC) 5 MG/2ML IV SOLN
INTRAVENOUS | Status: AC
  Filled 2021-05-21: qty 4

## 2021-05-21 MED ORDER — LACTATED RINGERS IV SOLN
INTRAVENOUS | Status: DC | PRN
Start: 2021-05-21 — End: 2021-05-21

## 2021-05-21 MED ORDER — DIPHENHYDRAMINE HCL 50 MG/ML IJ SOLN
INTRAMUSCULAR | Status: AC
  Filled 2021-05-21: qty 1

## 2021-05-21 MED ORDER — VANCOMYCIN HCL IN DEXTROSE 1-5 GM/200ML-% IV SOLN
1000.0000 mg | Freq: Once | INTRAVENOUS | Status: AC
  Administered 2021-05-21: 21:00:00 1000 mg via INTRAVENOUS
  Filled 2021-05-21: qty 200

## 2021-05-21 MED ORDER — FENTANYL CITRATE (PF) 100 MCG/2ML IJ SOLN: 25.0000 ug | INTRAMUSCULAR | Status: DC | PRN

## 2021-05-21 MED ORDER — VANCOMYCIN HCL 1500 MG/300ML IV SOLN
1500.0000 mg | INTRAVENOUS | Status: AC
  Administered 2021-05-21: 08:00:00 1500 mg via INTRAVENOUS
  Filled 2021-05-21: qty 300

## 2021-05-21 MED ORDER — VASOPRESSIN 20 UNITS/100 ML INFUSION FOR SHOCK
0.0000 [IU]/min | INTRAVENOUS | Status: DC
  Administered 2021-05-21: 13:00:00 0.03 [IU]/min via INTRAVENOUS
  Filled 2021-05-21: qty 100

## 2021-05-21 MED ORDER — ALBUMIN HUMAN 5 % IV SOLN
12.5000 g | Freq: Once | INTRAVENOUS | Status: AC
  Administered 2021-05-21: 12:00:00 12.5 g via INTRAVENOUS
  Filled 2021-05-21: qty 250

## 2021-05-21 MED ORDER — SODIUM CHLORIDE 0.9 % IV SOLN
25.0000 ug/min | INTRAVENOUS | Status: DC
Start: 2021-05-21 — End: 2021-05-21
  Administered 2021-05-21: 11:00:00 50 ug/min via INTRAVENOUS

## 2021-05-21 MED ORDER — METHYLPREDNISOLONE SODIUM SUCC 125 MG IJ SOLR
INTRAMUSCULAR | Status: AC
  Filled 2021-05-21: qty 2

## 2021-05-21 MED ORDER — GUAIFENESIN-DM 100-10 MG/5ML PO SYRP: 15.0000 mL | ORAL_SOLUTION | ORAL | Status: DC | PRN

## 2021-05-21 MED ORDER — PHENYLEPHRINE 40 MCG/ML (10ML) SYRINGE FOR IV PUSH (FOR BLOOD PRESSURE SUPPORT)
PREFILLED_SYRINGE | INTRAVENOUS | Status: DC | PRN
  Administered 2021-05-21 (×3): 80 ug via INTRAVENOUS

## 2021-05-21 SURGICAL SUPPLY — 49 items
ADH SKN CLS APL DERMABOND .7 (GAUZE/BANDAGES/DRESSINGS) ×1
BAG COUNTER SPONGE SURGICOUNT (BAG) ×3 IMPLANT
BAG DECANTER FOR FLEXI CONT (MISCELLANEOUS) ×3 IMPLANT
BAG SPNG CNTER NS LX DISP (BAG) ×1
CANISTER SUCT 3000ML PPV (MISCELLANEOUS) ×3 IMPLANT
CANNULA VESSEL 3MM 2 BLNT TIP (CANNULA) ×9 IMPLANT
CATH ROBINSON RED A/P 18FR (CATHETERS) ×3 IMPLANT
CLIP VESOCCLUDE MED 24/CT (CLIP) ×3 IMPLANT
CLIP VESOCCLUDE SM WIDE 24/CT (CLIP) ×3 IMPLANT
DERMABOND ADVANCED (GAUZE/BANDAGES/DRESSINGS) ×1
DERMABOND ADVANCED .7 DNX12 (GAUZE/BANDAGES/DRESSINGS) ×2 IMPLANT
DRAIN CHANNEL 15F RND FF W/TCR (WOUND CARE) IMPLANT
DRAPE INCISE IOBAN 66X45 STRL (DRAPES) ×1 IMPLANT
ELECT REM PT RETURN 9FT ADLT (ELECTROSURGICAL) ×2
ELECTRODE REM PT RTRN 9FT ADLT (ELECTROSURGICAL) ×2 IMPLANT
EVACUATOR SILICONE 100CC (DRAIN) IMPLANT
GLOVE SRG 8 PF TXTR STRL LF DI (GLOVE) ×2 IMPLANT
GLOVE SURG ENC MOIS LTX SZ7.5 (GLOVE) ×3 IMPLANT
GLOVE SURG POLY ORTHO LF SZ7.5 (GLOVE) IMPLANT
GLOVE SURG UNDER LTX SZ8 (GLOVE) ×3 IMPLANT
GLOVE SURG UNDER POLY LF SZ6.5 (GLOVE) ×1 IMPLANT
GLOVE SURG UNDER POLY LF SZ8 (GLOVE) ×2
GOWN STRL REUS W/ TWL LRG LVL3 (GOWN DISPOSABLE) ×6 IMPLANT
GOWN STRL REUS W/TWL LRG LVL3 (GOWN DISPOSABLE) ×6
GRAFT VASC PATCH XENOSURE 1X14 (Vascular Products) ×1 IMPLANT
KIT BASIN OR (CUSTOM PROCEDURE TRAY) ×3 IMPLANT
KIT SHUNT ARGYLE CAROTID ART 6 (VASCULAR PRODUCTS) ×1 IMPLANT
KIT TURNOVER KIT B (KITS) ×3 IMPLANT
NDL HYPO 25GX1X1/2 BEV (NEEDLE) ×2 IMPLANT
NEEDLE HYPO 25GX1X1/2 BEV (NEEDLE) ×2 IMPLANT
NS IRRIG 1000ML POUR BTL (IV SOLUTION) ×6 IMPLANT
PACK CAROTID (CUSTOM PROCEDURE TRAY) ×3 IMPLANT
PAD ARMBOARD 7.5X6 YLW CONV (MISCELLANEOUS) ×6 IMPLANT
POSITIONER HEAD DONUT 9IN (MISCELLANEOUS) ×3 IMPLANT
SET WALTER ACTIVATION W/DRAPE (SET/KITS/TRAYS/PACK) ×1 IMPLANT
SHUNT CAROTID BYPASS 10 (VASCULAR PRODUCTS) IMPLANT
SHUNT CAROTID BYPASS 12 (VASCULAR PRODUCTS) IMPLANT
SPONGE SURGIFOAM ABS GEL 100 (HEMOSTASIS) IMPLANT
SPONGE T-LAP 18X18 ~~LOC~~+RFID (SPONGE) ×1 IMPLANT
SUT MNCRL AB 4-0 PS2 18 (SUTURE) ×3 IMPLANT
SUT PROLENE 5 0 C 1 24 (SUTURE) ×6 IMPLANT
SUT PROLENE 6 0 BV (SUTURE) ×8 IMPLANT
SUT SILK 2 0 PERMA HAND 18 BK (SUTURE) IMPLANT
SUT VIC AB 3-0 SH 27 (SUTURE) ×2
SUT VIC AB 3-0 SH 27X BRD (SUTURE) ×2 IMPLANT
SYR 20ML LL LF (SYRINGE) ×3 IMPLANT
SYR CONTROL 10ML LL (SYRINGE) ×3 IMPLANT
TOWEL GREEN STERILE (TOWEL DISPOSABLE) ×3 IMPLANT
WATER STERILE IRR 1000ML POUR (IV SOLUTION) ×3 IMPLANT

## 2021-05-21 NOTE — Progress Notes (Signed)
Date and time results received: 05/21/21 1650 (use smartphrase ".now" to insert current time)  Test: Lactic Acid Critical Value: 2.0  Name of Provider Notified: [name] MD  Orders Received? Or Actions Taken?: no new orders

## 2021-05-21 NOTE — Progress Notes (Signed)
Per Dr. Erlinda Hong, use cuff pressure for titration of pressors

## 2021-05-21 NOTE — Assessment & Plan Note (Addendum)
Currently rate controlled.  Plan:  - Resume home Eliquis - Consider restarting metoprolol tomorrow

## 2021-05-21 NOTE — Progress Notes (Signed)
Kindly see my note on 05/20/2021 for all details.  Patient was taken for surgery early in the morning prior to 7 AM, got called from PACU that patient is extremely hypotensive.  According to the anesthesia staff patient's blood pressure dropped a little as he was being induced for endotracheal intubation, post left carotid endarterectomy surgery he remained extremely hypotensive.  Had to be given 2 L of LR, placed on IV Levophed along with Neo-Synephrine, IV Solu-Medrol 125 mg, both cuff and A-line pressures have been low.  ICU team has been consulted and he will be transferred to the ICU for close monitoring.  He is currently having no headache,  no chest pain or shortness of breath, no palpitations, extremities are warm and he is neurologically intact, ICU team bedside Case has been handed over to them.

## 2021-05-21 NOTE — Progress Notes (Signed)
Contacted Dr. Lynetta Mare regarding 2000 dose of vancomycin. He provided a VO for 1x dose of 25mg  benadryl IVP to be given with infusion.

## 2021-05-21 NOTE — Anesthesia Procedure Notes (Signed)
Arterial Line Insertion Start/End1/24/2023 7:10 AM, 05/21/2021 7:20 AM Performed by: Moshe Salisbury, CRNA, CRNA  Patient location: Pre-op. Preanesthetic checklist: patient identified, IV checked, site marked, risks and benefits discussed, surgical consent, monitors and equipment checked, pre-op evaluation, timeout performed and anesthesia consent Lidocaine 1% used for infiltration Left, radial was placed Catheter size: 20 G Hand hygiene performed , maximum sterile barriers used  and Seldinger technique used  Attempts: 1 Procedure performed without using ultrasound guided technique. Following insertion, dressing applied and Biopatch. Post procedure assessment: normal  Patient tolerated the procedure well with no immediate complications.

## 2021-05-21 NOTE — Progress Notes (Signed)
CCM Progress Note  Patient Details Name: Wayne Ortiz MRN: [id number] DOB: 01/20/1946  LOS: 38  76 year old man who presented with partial vision loss which was felt to be due to amaurosis fugax.  Found to have 70% left ICA occlusion. Underwent left CEA during which he developed hypotension following induction of anesthesia which has persisted in PACU.   Assessment and Plan Cardiovascular and Mediastinum Persistent atrial fibrillation ([place]) Overview PAF s/p ablation 02/2015 PVI with posterior wall and junction of SVC/RA ablation, repeat PVI 04/01/16 (both at [facility]). Currently followed by [facility].     Assessment & Plan Currently rate controlled.  Plan:  - Amiodarone if develops RVR  CAD S/P RCA and CFX DES Feb 2016 Assessment & Plan No clear evidence of NSTEMI at this time - EKG unremarkable.   Respiratory IPF (idiopathic pulmonary fibrosis) (HCC) Overview On 3Lpm at baseline.  PFT's normal in 2018   Denies increased dyspnea. Currently chest clear.     Assessment & Plan Plan:  - Incentive spirometry - Titrate O2 to keep sat >90%  Other History of left-sided carotid endarterectomy Assessment & Plan Post operative wound care.   Shock ([place]) Overview Patient developed hypotension on induction of anesthesia for left CEA for TIA on this admission.  Remains on norepinephrine, phenylephrine.   On examination - awake and alert. Extremities warm. HS normal with equal breath sounds.   POC echo shows normal LV EF, no pericardial effusion. RV appears moderately enlarged and at least mildly dyskinetic.   Assessment & Plan Shock of unclear etiology - appears to be predominantly distributive in origin, but cannot rule out hypovolemia or pulmonary embolism.  Plan:   - Titrate NE and VP to keep > MAP 65. - Follow right arm NIBP as left arterial line doesn't vary with pressors.  - Formal echocardiogram - Cycle troponins - CTA PE protocol - CBC - transfuse  and consider abdominal imaging if evidence of blood loss. -     Additional comments:I reviewed the patient's new clinical lab test results.  , I reviewed the patients new imaging test results. CXR, and I personally viewed and interpreted the patient's ECG:    Critical Care Total Time*: 1 Hour  Sherl Yzaguirre 05/21/2021  *Care during the described time interval was provided by me and/or other providers on the critical care team.  I have reviewed this patient's available data, including medical history, events of note, physical examination and test results as part of my evaluation.

## 2021-05-21 NOTE — Assessment & Plan Note (Signed)
Post operative wound care.

## 2021-05-21 NOTE — Op Note (Signed)
NAME: Wayne Ortiz    MRN: [id number] DOB: 11-28-1945    DATE OF OPERATION: 05/21/2021  PREOP DIAGNOSIS:    Symptomatic left carotid stenosis  POSTOP DIAGNOSIS:    Same  PROCEDURE:    Resection of redundant left common carotid artery Left carotid endarterectomy with bovine pericardial patch angioplasty  SURGEON: Judeth Cornfield. Scot Dock, MD  ASSIST: Dr. Curt Jews  ANESTHESIA: General  EBL: Minimal  INDICATIONS:    Wayne Ortiz is a 76 y.o. male who would presented with 3 episodes of amaurosis fugax.  He was found to have a significant left carotid stenosis.  He presents for carotid endarterectomy  FINDINGS:   The common and internal carotid arteries were quite tortuous and I had to resect the common carotid artery to prevent kinking.  TECHNIQUE:   The patient was taken to the operating room and received a general anesthetic.  After careful positioning the left neck was prepped and draped in the usual sterile fashion.  An incision was made along the anterior border the sternocleidomastoid and the dissection carried down to the common carotid artery which was dissected free and controlled with Rummel tourniquet.  The patient was heparinized.  ACT was monitored throughout the procedure.  The facial vein was divided between 2-0 silk ties.  I was then able to dissect the external carotid artery and superior thyroid arteries which were both controlled.  I then was able to get the internal carotid artery well above the plaque.  The artery was quite tortuous here.  Clamps were then placed on the internal and the common then the external carotid artery.  A longitudinal arteriotomy was made in the common carotid artery and this was extended to the plaque into the internal carotid artery.  There was excellent backbleeding from the internal carotid artery and given the tortuosity in the internal carotid artery I decided not to place a shunt.  Mean systolic pressure was maintained above 100  throughout the procedure.  An endarterectomy plane was established proximally and the plaque was sharply divided.  Of note the plaque extended fairly low on the common carotid artery.  Distally there was a nice tapering the plaque and no tacking sutures were required.  The artery was irrigated with copious amounts of heparin and dextran and all loose debris removed.  There was significant redundancy in the artery and I felt that he was at high risk for kink and therefore I resected about a 3 sonometer segment of the common carotid artery.  This backwall was sewn back together with running 5-0 Prolene suture.  Next a bovine pericardial patch was sewn using continuous 6-0 Prolene suture.  Prior to completing the patch closure the artery was backbled and flushed appropriately and the anastomosis completed.  Flow was reestablished first to the external carotid artery and into the internal carotid artery.  At the completion was a good pulse in the internal carotid artery and good signal with Doppler with diastolic flow.  Heparin was partially reversed with protamine.  The wound was then closed with a deep layer of 3-0 Vicryl, the platysma with running 3-0 Vicryl, and the skin closed with 4-0 Monocryl.  Dermabond was applied.  The patient awoke neurologically intact.  All needle and sponge counts were correct.  The patient tolerated the procedure well was transferred to recovery room in stable condition.  All needle and sponge counts were correct.  Given the complexity of the case a first assistant was necessary in order  to expedient the procedure and safely perform the technical aspects of the operation.  [name], MD, FACS [facility]  DATE OF DICTATION:   05/21/2021

## 2021-05-21 NOTE — Consult Note (Signed)
ANTICOAGULATION CONSULT NOTE   Pharmacy Consult for apixaban > heparin Indication: atrial fibrillation  Allergies  Allergen Reactions   Amiodarone Shortness Of Breath   Atorvastatin Other (See Comments)    Arthralgias   Dilaudid [Hydromorphone] Other (See Comments)    Shaking all over (chills/Malaise)   Hydrocodone Hives   Lisinopril Other (See Comments)    CP and SOB   Metoprolol Other (See Comments)    CP and SOb   Rosuvastatin Other (See Comments)    Myalgias   Diltiazem Rash   Rocephin [Ceftriaxone Sodium In Dextrose] Rash    Patient Measurements: Height: 6' (182.9 cm) Weight: 111.6 kg (246 lb 0.5 oz) IBW/kg (Calculated) : 77.6 Heparin Dosing Weight: 101.38  Vital Signs: Temp: 97.6 F (36.4 C) (01/24 1600) Temp Source: Oral (01/24 1600) BP: 106/77 (01/24 1815) Pulse Rate: 89 (01/24 1815)  Labs: Recent Labs    05/19/21 1028 05/19/21 1942 05/20/21 0211 05/20/21 1039 05/21/21 0455 05/21/21 1210 05/21/21 1540  HGB  --   --  16.1  --  16.0 13.8  --   HCT  --   --  47.8  --  47.1 44.3  --   PLT  --   --  125*  --  124* 145*  --   APTT 116* 105* 114*  --   --   --   --   HEPARINUNFRC  --   --  0.79* 0.54 0.39  --   --   CREATININE  --   --  1.13  --  1.20  --  1.19  TROPONINIHS  --   --   --   --   --   --  6     Estimated Creatinine Clearance: 68.1 mL/min (by C-G formula based on SCr of 1.19 mg/dL).   Medical History: Past Medical History:  Diagnosis Date   Arthritis    Atrial fibrillation (HCC)    BPH (benign prostatic hypertrophy)    CAD (coronary artery disease)    a. s/p DES 05/2014 at [facility])    Complication of anesthesia    BECAME COMBATIVE   Dyspnea    Dysrhythmia    Femur fracture (HCC)    GERD (gastroesophageal reflux disease)    occasional TUMS   Gout    History of gout    History of kidney stones    Hypertension    Hypothyroidism    Persistent atrial fibrillation (HCC)    Precancerous skin lesion    Renal disorder      Medications:  Scheduled:   amisulpride       aspirin EC  81 mg Oral Daily   Chlorhexidine Gluconate Cloth  6 each Topical Daily   diphenhydrAMINE       diphenhydrAMINE  25 mg Intravenous Once   [START ON 05/22/2021] docusate sodium  100 mg Oral Daily   ezetimibe  10 mg Oral Daily   furosemide  40 mg Oral Daily   gabapentin  900 mg Oral QHS   levothyroxine  112 mcg Oral Q0200   lidocaine  1 patch Transdermal QHS   methylPREDNISolone sodium succinate       metoprolol tartrate  25 mg Oral BID   niacin  100 mg Oral BID WC   ondansetron       pantoprazole  40 mg Oral Daily   sodium bicarbonate       Infusions:   sodium chloride     sodium chloride 100 mL/hr at  05/21/21 1800   sodium chloride     albumin human     albumin human     albumin human     albumin human     lactated ringers     magnesium sulfate bolus IVPB     norepinephrine (LEVOPHED) Adult infusion 10 mcg/min (05/21/21 1800)   phenylephrine (NEO-SYNEPHRINE) Adult infusion 30 mcg/min (05/21/21 1533)   vancomycin      Assessment: Pt came to hospital complaining of episodes of partial vision loss in left eye. TIA/vascular workup shows 80% left carotid stenosis. Pt on eliquis PTA for afib, and last dose was given at 0905 1/21. Vascular d'ced eliquis, and pharmacy has been consulted to dose heparin. Heparin was turned off prior to procedure 1/24.  Patient now s/p carotid endarterectomy 1/24. Symptoms were concerning for PE on patient's return from OR; however, CTA was negative for PE. Per discussion with Dr. Lynetta Mare, Pharmacy consulted to resume heparin at this time. Hg wnl, plt back up to 145. No bleed issues reported per discussion with RN.  Goal of Therapy:  Heparin level 0.3-0.7 units/ml aPPT 66-102 Monitor platelets by anticoagulation protocol: Yes   Plan:  No bolus with recent procedure. Resume heparin at previous therapeutic rate 1100 units/hr now per discussion with Dr. Lynetta Mare  Check 8hr heparin level  from resumption Monitor daily CBC, s/sx bleeding F/u resume PTA apixaban as appropriate   Arturo Morton, PharmD, BCPS Please check AMION for all [place] contact numbers Clinical Pharmacist 05/21/2021 6:20 PM

## 2021-05-21 NOTE — Progress Notes (Signed)
STROKE TEAM PROGRESS NOTE   INTERVAL HISTORY Patient is seen at [facility] ICU after CEA procedure. He developed hypotension after procedure, put on levo and vasopressin. Pt stated that he was groggy and not feeling well after procedure and now much improve, feels much better. Now BP 120s with cuff pressure. A line waveform is not accurate.   Vitals:   05/21/21 1430 05/21/21 1445 05/21/21 1500 05/21/21 1515  BP: (!) 138/98 127/80 123/84 110/74  Pulse: 91 94 87 91  Resp: 12 12 14 14   Temp:      TempSrc:      SpO2: 96% 96% 97% 97%  Weight:      Height:       CBC:  Recent Labs  Lab 05/20/21 0211 05/21/21 0455 05/21/21 1210  WBC 4.9 4.8 8.2  NEUTROABS 2.3 2.5  --   HGB 16.1 16.0 13.8  HCT 47.8 47.1 44.3  MCV 99.8 99.6 108.6*  PLT 125* 124* 884*   Basic Metabolic Panel:  Recent Labs  Lab 05/20/21 0211 05/21/21 0455  NA 138 139  K 4.0 3.8  CL 107 106  CO2 22 25  GLUCOSE 101* 95  BUN 20 16  CREATININE 1.13 1.20  CALCIUM 8.9 8.7*  MG 2.2 2.2   Lipid Panel:  Recent Labs  Lab 05/18/21 0059  CHOL 192  TRIG 105  HDL 46  CHOLHDL 4.2  VLDL 21  LDLCALC 125*   HgbA1c:  Recent Labs  Lab 05/18/21 0059  HGBA1C 5.7*   Urine Drug Screen:  Recent Labs  Lab 05/17/21 2113  LABOPIA POSITIVE*  COCAINSCRNUR NONE DETECTED  LABBENZ NONE DETECTED  AMPHETMU NONE DETECTED  THCU NONE DETECTED  LABBARB NONE DETECTED    Alcohol Level  Recent Labs  Lab 05/17/21 1711  ETH <10    IMAGING past 24 hours DG CHEST PORT 1 VIEW  Result Date: 05/21/2021 CLINICAL DATA:  Status post PICC central line placement. EXAM: PORTABLE CHEST 1 VIEW COMPARISON:  Radiographs 03/25/2019 and 03/04/2019.  CT 03/28/2016. FINDINGS: 1314 hours. The heart size is at the upper limits of normal for portable AP semi supine technique. Small caliber catheter overlies the left clavicle, with the tip projected superior to the aortic arch, likely within the left brachiocephalic vein. No evidence of pneumothorax or  significant pleural effusion. There is chronic elevation of the right hemidiaphragm. Mild pulmonary scarring is present, improved from prior radiographs. Patient is status post lower cervical fusion. IMPRESSION: 1. Tip of the left subclavian central venous catheter projects superior to the aortic arch, likely within the left brachiocephalic vein. Correlate clinically. 2. No evidence of pneumothorax. 3. Mild pulmonary scarring, improved from prior radiographs. Electronically Signed   By: Richardean Sale M.D.   On: 05/21/2021 13:27   ECHOCARDIOGRAM LIMITED  Result Date: 05/21/2021    ECHOCARDIOGRAM LIMITED REPORT   Patient Name:   Wayne Ortiz Date of Exam: 05/21/2021 Medical Rec #:  [id number]     Height:       72.0 in Accession #:    [id number]    Weight:       246.0 lb Date of Birth:  1945-07-23      BSA:          2.327 m Patient Age:    76 years      BP:           111/68 mmHg Patient Gender: M             HR:  97 bpm. Exam Location:  Inpatient Procedure: 2D Echo, Limited Echo and Cardiac Doppler Indications:    Dyspnea  History:        Patient has prior history of Echocardiogram examinations, most                 recent 05/18/2021. CAD, Arrythmias:Atrial Fibrillation; Risk                 Factors:Hypertension.  Sonographer:    Jefferey Pica Referring Phys: [place]  1. Left ventricular ejection fraction, by estimation, is 70 to 75%. The left ventricle has hyperdynamic function. The left ventricle has no regional wall motion abnormalities.  2. Right ventricular systolic function is moderately reduced. The right ventricular size is mildly enlarged. Tricuspid regurgitation signal is inadequate for assessing PA pressure.  3. A small pericardial effusion is present. The pericardial effusion is anterior to the right ventricle.  4. The mitral valve is grossly normal.  5. The aortic valve was not well visualized. Aortic valve regurgitation is not visualized.  6. The inferior vena cava  is normal in size with <50% respiratory variability, suggesting right atrial pressure of 8 mmHg. Comparison(s): Changes from prior study are noted. Compared to prior study, LV appears underfilled and hyperdynamic. FINDINGS  Left Ventricle: Left ventricular ejection fraction, by estimation, is 70 to 75%. The left ventricle has hyperdynamic function. The left ventricle has no regional wall motion abnormalities. The left ventricular internal cavity size was small. Right Ventricle: The right ventricular size is mildly enlarged. Right ventricular systolic function is moderately reduced. Tricuspid regurgitation signal is inadequate for assessing PA pressure. Pericardium: A small pericardial effusion is present. The pericardial effusion is anterior to the right ventricle. Mitral Valve: The mitral valve is grossly normal. Tricuspid Valve: The tricuspid valve is normal in structure. Tricuspid valve regurgitation is not demonstrated. No evidence of tricuspid stenosis. Aortic Valve: The aortic valve was not well visualized. Aortic valve regurgitation is not visualized. Aorta: The aortic root and ascending aorta are structurally normal, with no evidence of dilitation. Venous: The inferior vena cava is normal in size with less than 50% respiratory variability, suggesting right atrial pressure of 8 mmHg. LEFT VENTRICLE PLAX 2D LVIDd:         3.60 cm LVIDs:         1.70 cm LV PW:         1.45 cm LV IVS:        1.60 cm  RIGHT VENTRICLE RV Basal diam:  3.90 cm RV Mid diam:    3.30 cm LEFT ATRIUM         Index       RIGHT ATRIUM           Index LA diam:    3.90 cm 1.68 cm/m  RA Area:     21.60 cm                                 RA Volume:   65.90 ml  28.32 ml/m   AORTA Ao Root diam: 3.60 cm Ao Asc diam:  3.90 cm Rudean Haskell MD Electronically signed by Rudean Haskell MD Signature Date/Time: 05/21/2021/2:39:03 PM    Final     PHYSICAL EXAM  Temp:  [97.3 F (36.3 C)-98.2 F (36.8 C)] 98.2 F (36.8 C) (01/24  1330) Pulse Rate:  [51-156] 91 (01/24 1515) Resp:  [12-35] 14 (01/24 1515) BP: (  50-223)/(27-171) 110/74 (01/24 1515) SpO2:  [86 %-100 %] 97 % (01/24 1515) Arterial Line BP: (40-151)/(31-95) 116/80 (01/24 1515) Weight:  [111.6 kg] 111.6 kg (01/24 0734)  General - Well nourished, well developed, mildly lethargic.  Ophthalmologic - fundi not visualized due to noncooperation.  Cardiovascular - irregularly irregular heart rate and rhythm.  Neuro - awake, alert, eyes open, mildly lethargic, orientated to age, place, time and people. No aphasia, fluent language, but mild to moderate dysarthria with poor denture, following all simple commands. Able to name and repeat. No gaze palsy, tracking bilaterally, visual field full. No facial droop. Tongue midline. Bilateral UEs 5/5, no drift. Bilaterally LEs 4/5, no drift. Sensation symmetrical bilaterally, b/l FTN intact, gait not tested.     ASSESSMENT/PLAN Wayne Ortiz is a 76 y.o. male with history of PAF on Eliquis, CAD s/p DES, GERD, BPH, gout and hypothyroidism presenting with three episodes of amaurosis fugax, one three months ago and two in the two days before admission.  He reports that during these episodes, a shadow or curtain came across his field of vision.  His ophthalmologist sent him to the ED.  Patient has 70% stenosis of the left carotid artery, and this is likely the cause of his symptoms.  Plan is for him to undergo CEA on Tuesday, after his Eliquis has washed out.  Left amaurosis fugax due to left ICA high-grade stenosis CT head No acute abnormality. Small vessel disease. Atrophy.  CTA head & neck 70% stenosis of distal left common carotid artery MRI  no acute abnormality in brain or orbits.  Mild to moderate signal changes in cerebral white matter dur to small vessel disease Carotid Doppler  1-39% stenosis in bilateral carotid arteries 2D Echo EF 36%, grade 1 diastolic dysfunction, no atrial level shunt. LDL 125 HgbA1c 5.7 VTE  prophylaxis - fully anticoagulated with heparin Eliquis (apixaban) daily prior to admission, now off heparin IV for procedure. Will consider to resume eliquis in am if not contraindicated. Continue ASA 81.  Therapy recommendations:  pending Disposition:  pending  PAF On Eliquis PTA Now off heparin IV for surgery Rate controlled Will consider to resume eliquis in am if not contraindicated.  Hx of hypertension Hypotension post op Home meds:  metoprolol 25 mg BID Stable now On levophed and vasopressin, taper off as able Long-term BP goal normotensive Avoid low BP  Hyperlipidemia Home meds:  none LDL 125, goal < 70 Add zetia 10 mg daily  Previous statin intolerance  Other Stroke Risk Factors Advanced Age >/= 34  Former cigarette smoker Obesity, Body mass index is 33.37 kg/m., BMI >/= 30 associated with increased stroke risk, recommend weight loss, diet and exercise as appropriate   Other Active Problems Hypothyroidism Continue home levothyroxine  Hospital day # 3  This patient is critically ill due to left ICA stenosis s/p CEA, hypotension post op and at significant risk of neurological worsening, death form hypotension, shock, recurrent stroke. This patient's care requires constant monitoring of vital signs, hemodynamics, respiratory and cardiac monitoring, review of multiple databases, neurological assessment, discussion with family, other specialists and medical decision making of high complexity. I spent 30 minutes of neurocritical care time in the care of this patient.  Rosalin Hawking, MD PhD Stroke Neurology 05/21/2021 3:50 PM    To contact Stroke Continuity provider, please refer to [email or web address] After hours, contact General Neurology

## 2021-05-21 NOTE — Interval H&P Note (Signed)
History and Physical Interval Note:  05/21/2021 7:11 AM  Wayne Ortiz  has presented today for surgery, with the diagnosis of TIA.  The various methods of treatment have been discussed with the patient and family. After consideration of risks, benefits and other options for treatment, the patient has consented to  Procedure(s): LEFT ENDARTERECTOMY CAROTID (Left) as a surgical intervention.  The patient's history has been reviewed, patient examined, no change in status, stable for surgery.  I have reviewed the patient's chart and labs.  Questions were answered to the patient's satisfaction.     [name]

## 2021-05-21 NOTE — Progress Notes (Signed)
Pharmacy Antibiotic Note  Wayne Ortiz is a 76 y.o. male admitted on 05/17/2021 with surgical prophylaxis.  Pharmacy has been consulted for vancomycin dosing.  Plan: -Vancomycin 1500 mg IV on call to surgery followed by vancomycin 1 gm 12 hours post op. Will f/u with vascular in AM if prolonged abx is needed   Weight: 111.6 kg (246 lb) (bed weight)  Temp (24hrs), Avg:97.7 F (36.5 C), Min:97.3 F (36.3 C), Max:97.9 F (36.6 C)  Recent Labs  Lab 05/17/21 1711 05/18/21 0059 05/19/21 0136 05/20/21 0211  WBC 5.3 5.4 4.3 4.9  CREATININE 1.15 1.10 1.13 1.13    Estimated Creatinine Clearance: 71.7 mL/min (by C-G formula based on SCr of 1.13 mg/dL).    Allergies  Allergen Reactions   Amiodarone Shortness Of Breath   Atorvastatin Other (See Comments)    Arthralgias   Dilaudid [Hydromorphone] Other (See Comments)    Shaking all over (chills/Malaise)   Hydrocodone Hives   Lisinopril Other (See Comments)    CP and SOB   Metoprolol Other (See Comments)    CP and SOb   Rosuvastatin Other (See Comments)    Myalgias   Diltiazem Rash   Rocephin [Ceftriaxone Sodium In Dextrose] Rash     Thank you for allowing pharmacy to be a part of this patients care.  Albertina Parr, PharmD., BCCCP Clinical Pharmacist Please refer to [facility] for unit-specific pharmacist

## 2021-05-21 NOTE — Progress Notes (Signed)
° °  VASCULAR SURGERY POSTOP:   The patient developed hypotension postop possibly related to an allergic reaction to vancomycin.  His blood pressure has now improved on Norepi.  He is neurologically intact.  Appreciate Dr. Michelle Piper help.   SUBJECTIVE:   No specific complaints.  He feels much better compared to how he felt in the recovery room.  PHYSICAL EXAM:   Vitals:   05/21/21 1530 05/21/21 1545 05/21/21 1600 05/21/21 1615  BP: (!) 124/98 (!) 140/108 (!) 126/100 93/69  Pulse: 84 83 91 95  Resp: (!) 22 20 15 18   Temp:   97.6 F (36.4 C)   TempSrc:   Oral   SpO2: 98% 98% 99% 99%  Weight:      Height:       No focal weakness or paresthesias. His tongue is midline. His incision looks fine  LABS:   Lab Results  Component Value Date   WBC 8.2 05/21/2021   HGB 13.8 05/21/2021   HCT 44.3 05/21/2021   MCV 108.6 (H) 05/21/2021   PLT 145 (L) 05/21/2021   Lab Results  Component Value Date   CREATININE 1.20 05/21/2021   Lab Results  Component Value Date   INR 1.1 05/17/2021    PROBLEM LIST:    Principal Problem:   TIA (transient ischemic attack) Active Problems:   CAD S/P RCA and CFX DES Feb 2016   Persistent atrial fibrillation (HCC)   Essential hypertension   Hypothyroidism   Shock ([place])   History of left-sided carotid endarterectomy   IPF (idiopathic pulmonary fibrosis) (HCC)   CURRENT MEDS:    amisulpride       aspirin EC  81 mg Oral Daily   Chlorhexidine Gluconate Cloth  6 each Topical Daily   diphenhydrAMINE       [START ON 05/22/2021] docusate sodium  100 mg Oral Daily   ezetimibe  10 mg Oral Daily   furosemide  40 mg Oral Daily   gabapentin  900 mg Oral QHS   levothyroxine  112 mcg Oral Q0200   lidocaine  1 patch Transdermal QHS   methylPREDNISolone sodium succinate       metoprolol tartrate  25 mg Oral BID   niacin  100 mg Oral BID WC   ondansetron       pantoprazole  40 mg Oral Daily   sodium bicarbonate        [name] Office: [phone] 05/21/2021

## 2021-05-21 NOTE — Procedures (Signed)
Central Venous Catheter Insertion Procedure Note  Wayne Ortiz  [id number]  Jul 24, 1945  Date:05/21/21  Time:12:59 PM   Provider Performing:Carletta Feasel   Procedure: Insertion of Non-tunneled Central Venous Catheter(36556) with US guidance (62130)   Indication(s) Medication administration  Consent Unable to obtain consent due to emergent nature of procedure.  Anesthesia Topical only with 1% lidocaine   Timeout Verified patient identification, verified procedure, site/side was marked, verified correct patient position, special equipment/implants available, medications/allergies/relevant history reviewed, required imaging and test results available.  Sterile Technique Maximal sterile technique including full sterile barrier drape, hand hygiene, sterile gown, sterile gloves, mask, hair covering, sterile ultrasound probe cover (if used).  Procedure Description Area of catheter insertion was cleaned with chlorhexidine and draped in sterile fashion.  With real-time ultrasound guidance a 48F 20 cm dual lumen central venous catheter was placed into the left subclavian vein. Nonpulsatile blood flow and easy flushing noted in all ports.  The catheter was sutured in place and sterile dressing applied.  Complications/Tolerance None; patient tolerated the procedure well. Chest X-ray is ordered to verify placement for internal jugular or subclavian cannulation.   Chest x-ray is not ordered for femoral cannulation.  EBL Minimal  Specimen(s) None  Wayne Brood, MD [facility] ICU Physician [place]  Pager: [phone] Or Epic Secure Chat After hours: [phone].  05/21/2021, 1:00 PM

## 2021-05-21 NOTE — Assessment & Plan Note (Signed)
No clear evidence of NSTEMI at this time - EKG unremarkable.

## 2021-05-21 NOTE — Consult Note (Signed)
NAME:  Wayne Ortiz, MRN:  [id number], DOB:  Nov 03, 1945, LOS: 3 ADMISSION DATE:  05/17/2021, CONSULTATION DATE: 05/21/2021 REFERRING MD: Vascular surgery, CHIEF COMPLAINT: Hypotension  History of Present Illness:  76 year old male with well-documented past medical history listed below who presents with TIA underwent urgent left carotid endarterectomy for extensive blockage of the left carotid.  Following surgery at hypotensive state requiring vasopressor support fluid resuscitation pulmonary critical care was called to the bedside.  He is awake and alert and following commands.  Twelve-lead EKG and stat bedside echo being performed.  Will be transferred to intensive care for further evaluation and treatment he may need a central line in the near future.  Pertinent  Medical History   Past Medical History:  Diagnosis Date   Arthritis    Atrial fibrillation (HCC)    BPH (benign prostatic hypertrophy)    CAD (coronary artery disease)    a. s/p DES 05/2014 at [facility])    Complication of anesthesia    BECAME COMBATIVE   Dyspnea    Dysrhythmia    Femur fracture (HCC)    GERD (gastroesophageal reflux disease)    occasional TUMS   Gout    History of gout    History of kidney stones    Hypertension    Hypothyroidism    Persistent atrial fibrillation (HCC)    Precancerous skin lesion    Renal disorder      Significant Hospital Events: Including procedures, antibiotic start and stop dates in addition to other pertinent events   05/21/2021 hypotension following left carotid endarterectomy  Interim History / Subjective:  76 year old male presented with symptoms of TIA underwent urgent left carotid endarterectomy and subsequent hypotensive state.  Objective   Blood pressure 111/68, pulse 66, temperature 98 F (36.7 C), resp. rate (!) 21, height 6' (1.829 m), weight 111.6 kg, SpO2 98 %.        Intake/Output Summary (Last 24 hours) at 05/21/2021 1206 Last data filed at  05/21/2021 1054 Gross per 24 hour  Intake 656.29 ml  Output 50 ml  Net 606.29 ml   Filed Weights   05/18/21 0900 05/21/21 0734  Weight: 111.6 kg 111.6 kg    Examination: General: Obese male no acute distress at rest HENT: Left carotid endarterectomy site is unremarkable Lungs: Decreased breath sounds in the bases Cardiovascular: Heart sounds are regular with atrial fibrillation controlled ventricular rate Abdomen: Obese soft positive bowel sound Extremities: Warm and dry Neuro: Grossly intact without focal defect GU: Voids  Resolved Hospital Problem list     Assessment & Plan:  Acute hypotensive crisis following left carotid endarterectomy 05/21/2021.  Patient is awake and alert follows commands currently is on vasopressor support with Neo-Synephrine and Levophed via peripheral IVs.  He is awake and alert neurologically intact moves all extremities follows commands. Transferred to heart unit Stat twelve-lead EKG Stat 2D echo Stat ABG Continue vasopressor support May need central line for higher dose of vasopressor Volume resuscitation Vascular surgery is following  Chronic atrial fibrillation Cardiology consult       Best Practice (right click and "Reselect all SmartList Selections" daily)   Diet/type: NPO DVT prophylaxis: not indicated GI prophylaxis: PPI Lines: [name] Foley:  [name] Code Status:  full code Last date of multidisciplinary goals of care discussion [tbd]  Labs   CBC: Recent Labs  Lab 05/17/21 1711 05/18/21 0059 05/19/21 0136 05/20/21 0211 05/21/21 0455  WBC 5.3 5.4 4.3 4.9 4.8  NEUTROABS 3.2  --  2.3 2.3 2.5  HGB 16.7 16.0 15.8 16.1 16.0  HCT 49.4 48.0 46.8 47.8 47.1  MCV 103.6* 101.1* 99.6 99.8 99.6  PLT 145* 129* 123* 125* 124*    Basic Metabolic Panel: Recent Labs  Lab 05/17/21 1711 05/18/21 0059 05/19/21 0136 05/20/21 0211 05/21/21 0455  NA 142 140 141 138 139  K 4.0 4.2 3.8 4.0 3.8  CL 105 105 107 107 106  CO2 26 27 24 22 25    GLUCOSE 98 102* 107* 101* 95  BUN 21 18 20 20 16   CREATININE 1.15 1.10 1.13 1.13 1.20  CALCIUM 9.2 9.3 9.1 8.9 8.7*  MG  --   --  2.0 2.2 2.2   GFR: Estimated Creatinine Clearance: 67.6 mL/min (by C-G formula based on SCr of 1.2 mg/dL). Recent Labs  Lab 05/18/21 0059 05/19/21 0136 05/20/21 0211 05/21/21 0455  WBC 5.4 4.3 4.9 4.8    Liver Function Tests: Recent Labs  Lab 05/17/21 1711 05/18/21 0059 05/19/21 0136 05/20/21 0211 05/21/21 0455  AST 22 19 17 19 20   ALT 20 18 17 17 20   ALKPHOS 53 51 54 54 49  BILITOT 0.5 0.5 0.8 0.5 0.6  PROT 7.2 6.7 6.6 6.7 6.4*  ALBUMIN 4.4 4.1 3.9 4.0 3.8   No results for input(s): LIPASE, AMYLASE in the last 168 hours. No results for input(s): AMMONIA in the last 168 hours.  ABG    Component Value Date/Time   PHART 7.461 (H) 03/02/2019 1043   PCO2ART 31.7 (L) 03/02/2019 1043   PO2ART 78.6 (L) 03/02/2019 1043   HCO3 24.2 03/02/2019 1043   TCO2 28 07/04/2016 0812   ACIDBASEDEF 1.0 03/02/2019 1043   O2SAT 95.1 03/02/2019 1043     Coagulation Profile: Recent Labs  Lab 05/17/21 1711  INR 1.1    Cardiac Enzymes: No results for input(s): CKTOTAL, CKMB, CKMBINDEX, TROPONINI in the last 168 hours.  HbA1C: Hgb A1c MFr Bld  Date/Time Value Ref Range Status  05/18/2021 12:59 AM 5.7 (H) 4.8 - 5.6 % Final    Comment:    (NOTE)         Prediabetes: 5.7 - 6.4         Diabetes: >6.4         Glycemic control for adults with diabetes: <7.0   08/08/2014 07:11 AM 5.8 (H) <5.7 % Final    Comment:                                                                           According to the ADA Clinical Practice Recommendations for 2011, when HbA1c is used as a screening test:     >=6.5%   Diagnostic of Diabetes Mellitus            (if abnormal result is confirmed)   5.7-6.4%   Increased risk of developing Diabetes Mellitus   References:Diagnosis and Classification of Diabetes Mellitus,Diabetes [facility],9470,96(GEZMO 1):S62-S69 and  Standards of Medical Care in         Diabetes - 2011,Diabetes Care,2011,34 (Suppl 1):S11-S61.       CBG: No results for input(s): GLUCAP in the last 168 hours.  Review of Systems:   10 point review of system taken, please see HPI for  positives and negatives. Denies fevers chills sweats.  No overt neurological deficit  Past Medical History:  He,  has a past medical history of Arthritis, Atrial fibrillation ([place]), BPH (benign prostatic hypertrophy), CAD (coronary artery disease), Cancer ([place]), Complication of anesthesia, Dyspnea, Dysrhythmia, Femur fracture ([place]), GERD (gastroesophageal reflux disease), Gout, History of gout, History of kidney stones, Hypertension, Hypothyroidism, Persistent atrial fibrillation ([place]), Precancerous skin lesion, and Renal disorder.   Surgical History:   Past Surgical History:  Procedure Laterality Date   arm fracture surgery Left    CARDIOVERSION [name] 12/09/2014   Procedure: CARDIOVERSION;  Surgeon: [name], MD;  Location: [place];  Service: Cardiovascular;  Laterality: [name];   CARDIOVERSION [name] 05/05/2016   Procedure: CARDIOVERSION;  Surgeon: [name], MD;  Location: [place];  Service: Cardiovascular;  Laterality: [name];   CARDIOVERSION [name] 08/22/2016   Procedure: CARDIOVERSION;  Surgeon: Lelon Perla, MD;  Location: [facility] ENDOSCOPY;  Service: Cardiovascular;  Laterality: [name];   [place] [name] 01/03/2013   Procedure: LAPAROSCOPIC CHOLECYSTECTOMY;  Surgeon: [name], MD;  Location: [name];  Service: General;  Laterality: [name];   ELECTROPHYSIOLOGIC STUDY [name] 03/15/2015   Procedure: Atrial Fibrillation Ablation;  Surgeon: [name], MD;  Location: [place] CV LAB;  Service: Cardiovascular;  Laterality: [name];   ELECTROPHYSIOLOGIC STUDY [name] 04/01/2016   Procedure: Atrial Fibrillation Ablation;  Surgeon: [name], MD;  Location: [place] CV LAB;  Service: Cardiovascular;   Laterality: [name];   ERCP [name] 01/02/2013   Procedure: ENDOSCOPIC RETROGRADE CHOLANGIOPANCREATOGRAPHY (ERCP);  Surgeon: [name], MD;  Location: [name];  Service: Endoscopy;  Laterality: [name];   FEMUR FRACTURE SURGERY     LIVER BIOPSY [name] 01/03/2013   Procedure: LIVER BIOPSY;  Surgeon: [name], MD;  Location: [name];  Service: General;  Laterality: [name];   RIGHT/LEFT HEART CATH AND CORONARY ANGIOGRAPHY [name] 07/04/2016   Procedure: Right/Left Heart Cath and Coronary Angiography;  Surgeon: Belva Crome, MD;  Location: [place] CV LAB;  Service: Cardiovascular;  Laterality: [name];   SPHINCTEROTOMY [name] 01/02/2013   Procedure: SPHINCTEROTOMY;  Surgeon: [name], MD;  Location: [name];  Service: Endoscopy;  Laterality: [name];  Stone Extraction   TEE WITHOUT CARDIOVERSION [name] 08/22/2016   Procedure: TRANSESOPHAGEAL ECHOCARDIOGRAM (TEE);  Surgeon: Lelon Perla, MD;  Location: [place];  Service: Cardiovascular;  Laterality: [name];   TOTAL KNEE ARTHROPLASTY Right 05/30/2013   Procedure: RIGHT TOTAL KNEE ARTHROPLASTY;  Surgeon: [name], MD;  Location: [name];  Service: Orthopedics;  Laterality: Right;   TOTAL KNEE ARTHROPLASTY Left 03/05/2021   Procedure: TOTAL KNEE ARTHROPLASTY;  Surgeon: Paralee Cancel, MD;  Location: [name];  Service: Orthopedics;  Laterality: Left;     Social History:   reports that he quit smoking about 48 years ago. His smoking use included cigarettes. He has a 20.00 pack-year smoking history. He has never used smokeless tobacco. He reports that he does not currently use alcohol. He reports that he does not use drugs.   Family History:  His family history includes Kidney Stones in his mother.   Allergies Allergies  Allergen Reactions   Amiodarone Shortness Of Breath   Atorvastatin Other (See Comments)    Arthralgias   Dilaudid [Hydromorphone] Other (See Comments)    Shaking all over (chills/Malaise)   Hydrocodone Hives   Lisinopril Other (See Comments)     CP and SOB  Metoprolol Other (See Comments)    CP and SOb   Rosuvastatin Other (See Comments)    Myalgias   Diltiazem Rash   Rocephin [Ceftriaxone Sodium In Dextrose] Rash     Home Medications  Prior to Admission medications   Medication Sig Start Date End Date Taking? Authorizing Provider  acetaminophen (TYLENOL) 500 MG tablet Take 1,000 mg by mouth every 6 (six) hours as needed for mild pain or fever.   Yes [provider]  allopurinol (ZYLOPRIM) 300 MG tablet Take 300 mg by mouth every morning.    Yes [provider]  docusate sodium (COLACE) 100 MG capsule Take 100-200 mg by mouth 2 (two) times daily as needed for constipation.   Yes [provider]  ELIQUIS 5 MG TABS tablet TAKE 1 TABLET BY MOUTH TWICE A DAY Patient taking differently: Take 5 mg by mouth 2 (two) times daily. 02/03/18  Yes Belva Crome, MD  furosemide (LASIX) 40 MG tablet TAKE ONE TABLET BY MOUTH DAILY,MAY TAKE EXTRA TAB DAILY AS NEEDED FOR WEIGHT GAIN/SWELLING Patient taking differently: Take 40 mg by mouth daily. 11/17/16  Yes Sherran Needs, NP  gabapentin (NEURONTIN) 300 MG capsule Take 900 mg by mouth at bedtime. 12/13/20  Yes [provider]  HYDROcodone-acetaminophen (NORCO) 10-325 MG tablet Take 1 tablet by mouth See admin instructions. Take 1 tablet by mouth every 6 to 8 hours as needed for pain. 04/16/21  Yes [provider]  metoprolol tartrate (LOPRESSOR) 25 MG tablet Take 12.5 mg by mouth 2 (two) times daily. 04/26/21  Yes [provider]  polyethylene glycol (MIRALAX / GLYCOLAX) 17 g packet Take 17 g by mouth daily as needed for mild constipation. 03/06/21  Yes Irving Copas, PA-C  SYNTHROID 112 MCG tablet Take 112 mcg by mouth daily at 2 am. 12/03/20  Yes [provider]     Critical care time: 37 min    Richardson Landry Eliakim Tendler ACNP Acute Care Nurse Practitioner [place] Please consult Amion 05/21/2021, 12:07 PM

## 2021-05-21 NOTE — Anesthesia Procedure Notes (Signed)
Procedure Name: Intubation Date/Time: 05/21/2021 7:44 AM Performed by: Moshe Salisbury, CRNA Pre-anesthesia Checklist: Patient identified, Emergency Drugs available, Suction available and Patient being monitored Patient Re-evaluated:Patient Re-evaluated prior to induction Oxygen Delivery Method: Circle System Utilized Preoxygenation: Pre-oxygenation with 100% oxygen Induction Type: IV induction Ventilation: Mask ventilation without difficulty Laryngoscope Size: Mac and 4 Grade View: Grade I Tube type: Oral Tube size: 8.0 mm Number of attempts: 1 Airway Equipment and Method: Stylet Placement Confirmation: ETT inserted through vocal cords under direct vision, positive ETCO2 and breath sounds checked- equal and bilateral Secured at: 22 cm Tube secured with: Tape Dental Injury: Teeth and Oropharynx as per pre-operative assessment

## 2021-05-21 NOTE — Assessment & Plan Note (Signed)
Plan:  - Incentive spirometry - Titrate O2 to keep sat >90%

## 2021-05-21 NOTE — Anesthesia Postprocedure Evaluation (Signed)
Anesthesia Post Note  Patient: LORIE MELICHAR  Procedure(s) Performed: LEFT CAROTID ENDARTERECTOMY AND RESECTION OF LEFT COMMON REDUNDANT CAROTID ARTERY (Left: Neck) PATCH ANGIOPLASTY USING XENOSURE BIOLOGIC PATCH (Left: Neck)     Patient location during evaluation: PACU Anesthesia Type: General Level of consciousness: sedated Pain management: pain level controlled Vital Signs Assessment: post-procedure vital signs reviewed and stable Respiratory status: spontaneous breathing and respiratory function stable Cardiovascular status: stable Postop Assessment: no apparent nausea or vomiting Anesthetic complications: yes   No notable events documented.  Last Vitals:  Vitals:   05/21/21 1320 05/21/21 1330  BP: 96/75 (!) 123/97  Pulse: 100 96  Resp: (!) 26 (!) 27  Temp:    SpO2: 95% 96%    Last Pain:  Vitals:   05/21/21 1105  TempSrc:   PainSc: 0-No pain                 Aneira Cavitt DANIEL

## 2021-05-21 NOTE — Assessment & Plan Note (Addendum)
Shock of unclear etiology - appears to be predominantly distributive in origin. No evidence of cardiogenic or obstructive shock. Now resolved and off vasopressors  Plan:  - Ready for transfer to hospitalist service - Continue to hold home diuretic, restart only if signs of volume overload

## 2021-05-21 NOTE — Progress Notes (Signed)
Echocardiogram 2D Echocardiogram has been performed.  Wayne Ortiz 05/21/2021, 1:20 PM

## 2021-05-21 NOTE — Transfer of Care (Signed)
Immediate Anesthesia Transfer of Care Note  Patient: ELSTER CORBELLO  Procedure(s) Performed: LEFT CAROTID ENDARTERECTOMY AND RESECTION OF LEFT COMMON REDUNDANT CAROTID ARTERY (Left: Neck) PATCH ANGIOPLASTY USING XENOSURE BIOLOGIC PATCH (Left: Neck)  Patient Location: PACU  Anesthesia Type:General  Level of Consciousness: drowsy and patient cooperative  Airway & Oxygen Therapy: Patient Spontanous Breathing and Patient connected to nasal cannula oxygen  Post-op Assessment: Report given to RN, Post -op Vital signs reviewed and stable and Patient moving all extremities  Post vital signs: Reviewed and stable  Last Vitals:  Vitals Value Taken Time  BP 85/45 05/21/21 1040  Temp 36.7 C 05/21/21 1040  Pulse 67 05/21/21 1052  Resp 23 05/21/21 1052  SpO2 99 % 05/21/21 1052  Vitals shown include unvalidated device data.  Last Pain:  Vitals:   05/21/21 1040  TempSrc:   PainSc: 0-No pain         Complications: No notable events documented.

## 2021-05-22 ENCOUNTER — Encounter (HOSPITAL_COMMUNITY): Payer: Self-pay | Admitting: Vascular Surgery

## 2021-05-22 DIAGNOSIS — Z48812 Encounter for surgical aftercare following surgery on the circulatory system: Secondary | ICD-10-CM

## 2021-05-22 DIAGNOSIS — I9581 Postprocedural hypotension: Secondary | ICD-10-CM

## 2021-05-22 DIAGNOSIS — G459 Transient cerebral ischemic attack, unspecified: Secondary | ICD-10-CM | POA: Diagnosis not present

## 2021-05-22 DIAGNOSIS — I2510 Atherosclerotic heart disease of native coronary artery without angina pectoris: Secondary | ICD-10-CM | POA: Diagnosis not present

## 2021-05-22 DIAGNOSIS — I10 Essential (primary) hypertension: Secondary | ICD-10-CM | POA: Diagnosis not present

## 2021-05-22 DIAGNOSIS — R579 Shock, unspecified: Secondary | ICD-10-CM

## 2021-05-22 DIAGNOSIS — I4819 Other persistent atrial fibrillation: Secondary | ICD-10-CM | POA: Diagnosis not present

## 2021-05-22 LAB — CBC WITH DIFFERENTIAL/PLATELET
Abs Immature Granulocytes: 0.06 10*3/uL (ref 0.00–0.07)
Basophils Absolute: 0 10*3/uL (ref 0.0–0.1)
Basophils Relative: 0 %
Eosinophils Absolute: 0 10*3/uL (ref 0.0–0.5)
Eosinophils Relative: 0 %
HCT: 40.7 % (ref 39.0–52.0)
Hemoglobin: 13.8 g/dL (ref 13.0–17.0)
Immature Granulocytes: 1 %
Lymphocytes Relative: 8 %
Lymphs Abs: 0.9 10*3/uL (ref 0.7–4.0)
MCH: 34.3 pg — ABNORMAL HIGH (ref 26.0–34.0)
MCHC: 33.9 g/dL (ref 30.0–36.0)
MCV: 101.2 fL — ABNORMAL HIGH (ref 80.0–100.0)
Monocytes Absolute: 0.6 10*3/uL (ref 0.1–1.0)
Monocytes Relative: 6 %
Neutro Abs: 9.3 10*3/uL — ABNORMAL HIGH (ref 1.7–7.7)
Neutrophils Relative %: 85 %
Platelets: 108 10*3/uL — ABNORMAL LOW (ref 150–400)
RBC: 4.02 MIL/uL — ABNORMAL LOW (ref 4.22–5.81)
RDW: 13.2 % (ref 11.5–15.5)
WBC: 10.9 10*3/uL — ABNORMAL HIGH (ref 4.0–10.5)
nRBC: 0 % (ref 0.0–0.2)

## 2021-05-22 LAB — COMPREHENSIVE METABOLIC PANEL
ALT: 23 U/L (ref 0–44)
AST: 26 U/L (ref 15–41)
Albumin: 3.5 g/dL (ref 3.5–5.0)
Alkaline Phosphatase: 29 U/L — ABNORMAL LOW (ref 38–126)
Anion gap: 8 (ref 5–15)
BUN: 17 mg/dL (ref 8–23)
CO2: 21 mmol/L — ABNORMAL LOW (ref 22–32)
Calcium: 8.1 mg/dL — ABNORMAL LOW (ref 8.9–10.3)
Chloride: 108 mmol/L (ref 98–111)
Creatinine, Ser: 1.12 mg/dL (ref 0.61–1.24)
GFR, Estimated: >60 mL/min (ref >60)
Glucose, Bld: 111 mg/dL — ABNORMAL HIGH (ref 70–99)
Potassium: 4.9 mmol/L (ref 3.5–5.1)
Sodium: 137 mmol/L (ref 135–145)
Total Bilirubin: 0.6 mg/dL (ref 0.3–1.2)
Total Protein: 5.4 g/dL — ABNORMAL LOW (ref 6.5–8.1)

## 2021-05-22 LAB — HEPARIN LEVEL (UNFRACTIONATED): Heparin Unfractionated: <0.1 IU/mL — ABNORMAL LOW (ref 0.30–0.70)

## 2021-05-22 LAB — LIPID PANEL
Cholesterol: 99 mg/dL (ref 0–200)
HDL: 38 mg/dL — ABNORMAL LOW (ref >40)
LDL Cholesterol: 54 mg/dL (ref 0–99)
Total CHOL/HDL Ratio: 2.6 RATIO
Triglycerides: 37 mg/dL (ref <150)
VLDL: 7 mg/dL (ref 0–40)

## 2021-05-22 LAB — MAGNESIUM: Magnesium: 1.8 mg/dL (ref 1.7–2.4)

## 2021-05-22 LAB — BRAIN NATRIURETIC PEPTIDE: B Natriuretic Peptide: 113 pg/mL — ABNORMAL HIGH (ref 0.0–100.0)

## 2021-05-22 MED ORDER — MAGNESIUM SULFATE 2 GM/50ML IV SOLN
2.0000 g | Freq: Once | INTRAVENOUS | Status: AC
  Administered 2021-05-22: 15:00:00 2 g via INTRAVENOUS
  Filled 2021-05-22: qty 50

## 2021-05-22 MED ORDER — APIXABAN 5 MG PO TABS
5.0000 mg | ORAL_TABLET | Freq: Two times a day (BID) | ORAL | Status: DC
  Administered 2021-05-22 – 2021-05-23 (×3): 5 mg via ORAL
  Filled 2021-05-22 (×3): qty 1

## 2021-05-22 NOTE — Evaluation (Signed)
Physical Therapy Evaluation & Discharge Patient Details Name: Wayne Ortiz MRN: [id number] DOB: 04/06/1946 Today's Date: 05/22/2021  History of Present Illness  76 y.o. male presented 05/17/21 to ED with a chief complaint of partial vision loss. CT head no acute findings. CTA with 70% stenosis in distal L common carotid artery and at bifurcation. S/p L carotid endarterectomy with bovine pericardial patch angioplasty on 1/24. PMH atrial fibrillation, BPH, coronary artery disease, GERD, history of gout, hypothyroidism  Clinical Impression  Patient admitted with above diagnosis and procedure. Patient functioning at independent level and reports being at baseline. Patient with hx of peripheral neuropathy and recent L TKA 2 months ago. No further skilled PT needs identified acutely. No PT follow up recommended at this time.         Recommendations for follow up therapy are one component of a multi-disciplinary discharge planning process, led by the attending physician.  Recommendations may be updated based on patient status, additional functional criteria and insurance authorization.  Follow Up Recommendations No PT follow up    Assistance Recommended at Discharge None  Patient can return home with the following       Equipment Recommendations None recommended by PT  Recommendations for Other Services       Functional Status Assessment Patient has not had a recent decline in their functional status     Precautions / Restrictions Precautions Precautions: None Precaution Comments: L TKA 2 months ago Restrictions Weight Bearing Restrictions: No      Mobility  Bed Mobility Overal bed mobility: Independent                  Transfers Overall transfer level: Independent                      Ambulation/Gait Ambulation/Gait assistance: Independent Gait Distance (Feet): 250 Feet Assistive device: None Gait Pattern/deviations: Step-through pattern, Antalgic Gait  velocity: WNL Gait velocity interpretation: >2.62 ft/sec, indicative of community ambulatory   General Gait Details: R heel pain due to neuropathy, hx of L TKA.  Stairs Stairs: Yes Stairs assistance: Independent Stair Management: One rail Left, Step to pattern, Forwards Number of Stairs: 2    Wheelchair Mobility    Modified Rankin (Stroke Patients Only)       Balance Overall balance assessment: Independent                                           Pertinent Vitals/Pain Pain Assessment Pain Assessment: Faces Faces Pain Scale: Hurts a little bit Pain Location: headache Pain Descriptors / Indicators: Headache Pain Intervention(s): Monitored during session, Repositioned    Home Living Family/patient expects to be discharged to:: Private residence Living Arrangements: Alone Available Help at Discharge: Family Type of Home: Mobile home Home Access: Stairs to enter Entrance Stairs-Rails: Left Entrance Stairs-Number of Steps: 5   Home Layout: One level Home Equipment: Conservation officer, nature (2 wheels);Cane - single point;BSC/3in1;Shower seat - built in;Grab bars - tub/shower      Prior Function Prior Level of Function : Independent/Modified Independent;Driving             Mobility Comments: Pt able to ambulate in community without AD ADLs Comments: Pt independent with ADLS, shopping, IADLS, and driving     Hand Dominance        Extremity/Trunk Assessment   Upper Extremity Assessment Upper Extremity Assessment: Overall  WFL for tasks assessed    Lower Extremity Assessment Lower Extremity Assessment: Overall WFL for tasks assessed (hx of peripheral neuropathy in bilateral feet)    Cervical / Trunk Assessment Cervical / Trunk Assessment: Normal  Communication   Communication: No difficulties  Cognition Arousal/Alertness: Awake/alert Behavior During Therapy: WFL for tasks assessed/performed Overall Cognitive Status: Within Functional Limits for  tasks assessed                                          General Comments      Exercises     Assessment/Plan    PT Assessment Patient does not need any further PT services  PT Problem List         PT Treatment Interventions      PT Goals (Current goals can be found in the Care Plan section)  Acute Rehab PT Goals Patient Stated Goal: get a check mark from you and go home PT Goal Formulation: All assessment and education complete, DC therapy    Frequency       Co-evaluation               AM-PAC PT "6 Clicks" Mobility  Outcome Measure Help needed turning from your back to your side while in a flat bed without using bedrails?: None Help needed moving from lying on your back to sitting on the side of a flat bed without using bedrails?: None Help needed moving to and from a bed to a chair (including a wheelchair)?: None Help needed standing up from a chair using your arms (e.g., wheelchair or bedside chair)?: None Help needed to walk in hospital room?: None Help needed climbing 3-5 steps with a railing? : None 6 Click Score: 24    End of Session   Activity Tolerance: Patient tolerated treatment well Patient left: in chair;with call bell/phone within reach Nurse Communication: Mobility status PT Visit Diagnosis: Difficulty in walking, not elsewhere classified (R26.2)    Time: 9672-8979 PT Time Calculation (min) (ACUTE ONLY): 20 min   Charges:   PT Evaluation $PT Eval Low Complexity: 1 Low          Gaspard Isbell A. Gilford Rile PT, DPT Acute Rehabilitation Services Pager [phone] Office [phone]   Linna Hoff 05/22/2021, 9:47 AM

## 2021-05-22 NOTE — Progress Notes (Signed)
Patient arrived to unit via wheelchair from 4N-ICU. Family at bedside. Patient A/O x4. No c/o pain. VSS. Bed in lowest position, call bell within reach. Plan of care continued.  Gwendolyn Grant, RN

## 2021-05-22 NOTE — Progress Notes (Signed)
° °  VASCULAR SURGERY ASSESSMENT & PLAN:   POD 1 LEFT CAROTID ENDARTERECTOMY: The patient is doing well this morning with no complaints.  He is on heparin and from my standpoint can start his Eliquis today.  POSTOP HYPOTENSION: The etiology of his postop hypotension is not clear.  It was long after he received protamine so I do not think it was a protamine reaction.  I thought it might be an allergic reaction to vancomycin but he received an additional dose in the ICU and had no problems with this.  Regardless he is off pressors and feels back to normal this morning.  VASCULAR QUALITY INITIATIVE: He is on aspirin (81 mg).  He is not on a statin as he has arthralgias related to statins.  DVT PROPHYLAXIS: He is on intravenous heparin and can start Eliquis today.  SUBJECTIVE:   No complaints this morning.  PHYSICAL EXAM:   Vitals:   05/22/21 0300 05/22/21 0400 05/22/21 0500 05/22/21 0600  BP: 106/71 100/63 105/71 114/73  Pulse: 88 86 85 84  Resp: 15 16 17 15   Temp:  97.6 F (36.4 C)    TempSrc:  Oral    SpO2: 91% (!) 89% 92% 90%  Weight:      Height:       No focal weakness or paresthesias. His incision looks fine. Tongue is midline.  LABS:   Lab Results  Component Value Date   WBC 10.9 (H) 05/22/2021   HGB 13.8 05/22/2021   HCT 40.7 05/22/2021   MCV 101.2 (H) 05/22/2021   PLT 108 (L) 05/22/2021   Lab Results  Component Value Date   CREATININE 1.12 05/22/2021   PROBLEM LIST:    Principal Problem:   TIA (transient ischemic attack) Active Problems:   CAD S/P RCA and CFX DES Feb 2016   Persistent atrial fibrillation (HCC)   Essential hypertension   Hypothyroidism   Shock ([place])   History of left-sided carotid endarterectomy   IPF (idiopathic pulmonary fibrosis) (HCC)   CURRENT MEDS:    aspirin EC  81 mg Oral Daily   Chlorhexidine Gluconate Cloth  6 each Topical Daily   docusate sodium  100 mg Oral Daily   ezetimibe  10 mg Oral Daily   furosemide  40 mg Oral  Daily   gabapentin  900 mg Oral QHS   levothyroxine  112 mcg Oral Q0200   lidocaine  1 patch Transdermal QHS   metoprolol tartrate  25 mg Oral BID   niacin  100 mg Oral BID WC   pantoprazole  40 mg Oral Daily    [name] Office: [phone] 05/22/2021

## 2021-05-22 NOTE — Progress Notes (Signed)
ANTICOAGULATION CONSULT NOTE - Follow Up Consult  Pharmacy Consult for heparin Indication: atrial fibrillation  Labs: Recent Labs    05/19/21 1028 05/19/21 1942 05/20/21 0211 05/20/21 0211 05/20/21 1039 05/21/21 0455 05/21/21 1210 05/21/21 1540 05/21/21 1727 05/22/21 0340  HGB  --   --  16.1   < >  --  16.0 13.8  --   --  13.8  HCT  --   --  47.8   < >  --  47.1 44.3  --   --  40.7  PLT  --   --  125*   < >  --  124* 145*  --   --  108*  APTT 116* 105* 114*  --   --   --   --   --   --   --   HEPARINUNFRC  --   --  0.79*   < > 0.54 0.39  --   --   --  <0.10*  CREATININE  --   --  1.13   < >  --  1.20  --  1.19  --  1.12  TROPONINIHS  --   --   --   --   --   --   --  6 5  --    < > = values in this interval not displayed.    Assessment: 76yo male subtherapeutic on heparin after resuming, Eliquis now cleared; no infusion issues or signs of bleeding per RN.  Goal of Therapy:  Heparin level 0.3-0.7 units/ml   Plan:  Will increase heparin infusion by 2 units/kg/hr to 1300 units/hr and check level in 8 hours.    Wynona Neat, PharmD, BCPS  05/22/2021,5:36 AM

## 2021-05-22 NOTE — Progress Notes (Signed)
OT Cancellation Note  Patient Details Name: Wayne Ortiz MRN: [id number] DOB: 05/07/1945   Cancelled Treatment:    Reason Eval/Treat Not Completed: OT screened, no needs identified, will sign off  [place], OT/L   Acute OT Clinical Specialist Acute Rehabilitation Services Pager [phone] Office [phone]  05/22/2021, 1:24 PM

## 2021-05-22 NOTE — Progress Notes (Addendum)
ANTICOAGULATION CONSULT NOTE - Follow Up Consult  Pharmacy Consult for heparin Indication: atrial fibrillation  Labs: Recent Labs    05/19/21 1028 05/19/21 1942 05/20/21 0211 05/20/21 0211 05/20/21 1039 05/21/21 0455 05/21/21 1210 05/21/21 1540 05/21/21 1727 05/22/21 0340  HGB  --   --  16.1   < >  --  16.0 13.8  --   --  13.8  HCT  --   --  47.8   < >  --  47.1 44.3  --   --  40.7  PLT  --   --  125*   < >  --  124* 145*  --   --  108*  APTT 116* 105* 114*  --   --   --   --   --   --   --   HEPARINUNFRC  --   --  0.79*   < > 0.54 0.39  --   --   --  <0.10*  CREATININE  --   --  1.13   < >  --  1.20  --  1.19  --  1.12  TROPONINIHS  --   --   --   --   --   --   --  6 5  --    < > = values in this interval not displayed.     Assessment: 34 YOM with history of paroxysmal AF on Eliquis PTA (LD 1/21 0905) presenting to ED with partial vision loss in the left eye. Vascular workup showed 70-80% stenosis of left carotid artery and was recommended for CEA for 1/24. Pharmacy was consulted for transitioning to Heparin prior to the procedure. Protamine 50mg  given intraoperatively 1/24 1000.  Now s/p CEA and had symptoms concerning for PE. CTA was negative. Cleared to transition to Eliquis per neuro note and discussion with CCM team this morning.   Hgb 13.8, Plts 145 >> 108.  No s/sx of bleeding per discussion with RN.  Plan:  Discontinue heparin gtt when Eliquis is administered Start Eliquis 5mg  BID   Pharmacy will [place] [employer] [facility]  Wayne Ortiz, PharmD, BCPS, [place] 05/22/2021, 9:04 AM

## 2021-05-22 NOTE — Progress Notes (Addendum)
STROKE TEAM PROGRESS NOTE   INTERVAL HISTORY Patient is seen in 4N ICU with no family at the bedside.  He has been hemodynamically stable, and his neurological exam is stable.  He states that he is looking forward to being discharged.  Vitals:   05/22/21 0859 05/22/21 0900 05/22/21 1000 05/22/21 1100  BP: 131/81  111/66 103/70  Pulse: 98 98 100 86  Resp: 20 19 (!) 21 17  Temp:      TempSrc:      SpO2: 95% 94% 93% 94%  Weight:      Height:       CBC:  Recent Labs  Lab 05/21/21 0455 05/21/21 1210 05/22/21 0340  WBC 4.8 8.2 10.9*  NEUTROABS 2.5  --  9.3*  HGB 16.0 13.8 13.8  HCT 47.1 44.3 40.7  MCV 99.6 108.6* 101.2*  PLT 124* 145* 108*    Basic Metabolic Panel:  Recent Labs  Lab 05/21/21 0455 05/21/21 1540 05/22/21 0340  NA 139 137 137  K 3.8 4.4 4.9  CL 106 107 108  CO2 25 20* 21*  GLUCOSE 95 190* 111*  BUN 16 18 17   CREATININE 1.20 1.19 1.12  CALCIUM 8.7* 7.7* 8.1*  MG 2.2  --  1.8    Lipid Panel:  Recent Labs  Lab 05/22/21 0340  CHOL 99  TRIG 37  HDL 38*  CHOLHDL 2.6  VLDL 7  LDLCALC 54    HgbA1c:  Recent Labs  Lab 05/18/21 0059  HGBA1C 5.7*    Urine Drug Screen:  Recent Labs  Lab 05/17/21 2113  LABOPIA POSITIVE*  COCAINSCRNUR NONE DETECTED  LABBENZ NONE DETECTED  AMPHETMU NONE DETECTED  THCU NONE DETECTED  LABBARB NONE DETECTED     Alcohol Level  Recent Labs  Lab 05/17/21 1711  ETH <10     IMAGING past 24 hours CT Angio Chest Pulmonary Embolism (PE) W or WO Contrast  Result Date: 05/21/2021 CLINICAL DATA:  Chest pain, shortness of breath EXAM: CT ANGIOGRAPHY CHEST WITH CONTRAST TECHNIQUE: Multidetector CT imaging of the chest was performed using the standard protocol during bolus administration of intravenous contrast. Multiplanar CT image reconstructions and MIPs were obtained to evaluate the vascular anatomy. RADIATION DOSE REDUCTION: This exam was performed according to the departmental dose-optimization program which  includes automated exposure control, adjustment of the mA and/or kV according to patient size and/or use of iterative reconstruction technique. CONTRAST:  62mL OMNIPAQUE IOHEXOL 350 MG/ML SOLN COMPARISON:  Previous studies including chest radiograph done earlier today FINDINGS: Cardiovascular: Coronary artery calcifications are seen. Coronary artery stents are noted in the right coronary. Heart is enlarged in size. There is homogeneous enhancement in thoracic aorta. There are no intraluminal filling defects in the pulmonary artery branches. Mediastinum/Nodes: No significant lymphadenopathy seen. Thyroid is not distinctly visualized. Lungs/Pleura: There is no focal pulmonary consolidation. Small bilateral pleural effusions are seen. Increased interstitial markings are seen in the anterior right upper lung fields. There are patchy faint ground-glass densities in the mid and lower lung fields. Linear densities seen in the left lower lung fields. Upper Abdomen: There is nodularity in the liver surface. There are low-density lesions in the renal cortex largest measuring 2.9 cm in the upper pole of left kidney. There is 9 mm exophytic lesion in the anterior upper pole of right kidney with density measurements higher than usual for simple cyst. Musculoskeletal: Unremarkable. Review of the MIP images confirms the above findings. IMPRESSION: There is no evidence of pulmonary artery embolism. There is  no evidence of thoracic aortic dissection. There are increased interstitial markings in the right upper lung fields anteriorly, possibly suggesting scarring. There are faint ground-glass densities in the parahilar regions and lower lung fields suggesting scarring or mild interstitial edema or interstitial pneumonia. Small bilateral pleural effusions are seen. There is 2.9 cm left renal cyst. There is 9 mm exophytic lesion in the anterior upper pole of right kidney with density measurement higher than usual for simple cyst.  Follow-up renal sonogram or multiphasic CT may be considered. There is mild nodularity in the liver surface which may be normal variation or suggest cirrhosis. Coronary artery disease. Electronically Signed   By: Elmer Picker M.D.   On: 05/21/2021 16:33   DG CHEST PORT 1 VIEW  Result Date: 05/21/2021 CLINICAL DATA:  Status post PICC central line placement. EXAM: PORTABLE CHEST 1 VIEW COMPARISON:  Radiographs 03/25/2019 and 03/04/2019.  CT 03/28/2016. FINDINGS: 1314 hours. The heart size is at the upper limits of normal for portable AP semi supine technique. Small caliber catheter overlies the left clavicle, with the tip projected superior to the aortic arch, likely within the left brachiocephalic vein. No evidence of pneumothorax or significant pleural effusion. There is chronic elevation of the right hemidiaphragm. Mild pulmonary scarring is present, improved from prior radiographs. Patient is status post lower cervical fusion. IMPRESSION: 1. Tip of the left subclavian central venous catheter projects superior to the aortic arch, likely within the left brachiocephalic vein. Correlate clinically. 2. No evidence of pneumothorax. 3. Mild pulmonary scarring, improved from prior radiographs. Electronically Signed   By: Richardean Sale M.D.   On: 05/21/2021 13:27   ECHOCARDIOGRAM LIMITED  Result Date: 05/21/2021    ECHOCARDIOGRAM LIMITED REPORT   Patient Name:   Wayne Ortiz Date of Exam: 05/21/2021 Medical Rec #:  [id number]     Height:       72.0 in Accession #:    [id number]    Weight:       246.0 lb Date of Birth:  [birth year]      BSA:          2.327 m Patient Age:    55 years      BP:           111/68 mmHg Patient Gender: M             HR:           97 bpm. Exam Location:  Inpatient Procedure: 2D Echo, Limited Echo and Cardiac Doppler Indications:    Dyspnea  History:        Patient has prior history of Echocardiogram examinations, most                 recent 05/18/2021. CAD, Arrythmias:Atrial  Fibrillation; Risk                 Factors:Hypertension.  Sonographer:    Jefferey Pica Referring Phys: [place]  1. Left ventricular ejection fraction, by estimation, is 70 to 75%. The left ventricle has hyperdynamic function. The left ventricle has no regional wall motion abnormalities.  2. Right ventricular systolic function is moderately reduced. The right ventricular size is mildly enlarged. Tricuspid regurgitation signal is inadequate for assessing PA pressure.  3. A small pericardial effusion is present. The pericardial effusion is anterior to the right ventricle.  4. The mitral valve is grossly normal.  5. The aortic valve was not well visualized. Aortic valve regurgitation is not visualized.  6.  The inferior vena cava is normal in size with <50% respiratory variability, suggesting right atrial pressure of 8 mmHg. Comparison(s): Changes from prior study are noted. Compared to prior study, LV appears underfilled and hyperdynamic. FINDINGS  Left Ventricle: Left ventricular ejection fraction, by estimation, is 70 to 75%. The left ventricle has hyperdynamic function. The left ventricle has no regional wall motion abnormalities. The left ventricular internal cavity size was small. Right Ventricle: The right ventricular size is mildly enlarged. Right ventricular systolic function is moderately reduced. Tricuspid regurgitation signal is inadequate for assessing PA pressure. Pericardium: A small pericardial effusion is present. The pericardial effusion is anterior to the right ventricle. Mitral Valve: The mitral valve is grossly normal. Tricuspid Valve: The tricuspid valve is normal in structure. Tricuspid valve regurgitation is not demonstrated. No evidence of tricuspid stenosis. Aortic Valve: The aortic valve was not well visualized. Aortic valve regurgitation is not visualized. Aorta: The aortic root and ascending aorta are structurally normal, with no evidence of dilitation. Venous:  The inferior vena cava is normal in size with less than 50% respiratory variability, suggesting right atrial pressure of 8 mmHg. LEFT VENTRICLE PLAX 2D LVIDd:         3.60 cm LVIDs:         1.70 cm LV PW:         1.45 cm LV IVS:        1.60 cm  RIGHT VENTRICLE RV Basal diam:  3.90 cm RV Mid diam:    3.30 cm LEFT ATRIUM         Index       RIGHT ATRIUM           Index LA diam:    3.90 cm 1.68 cm/m  RA Area:     21.60 cm                                 RA Volume:   65.90 ml  28.32 ml/m   AORTA Ao Root diam: 3.60 cm Ao Asc diam:  3.90 cm Rudean Haskell MD Electronically signed by Rudean Haskell MD Signature Date/Time: 05/21/2021/2:39:03 PM    Final     PHYSICAL EXAM  Temp:  [97.3 F (36.3 C)-98.2 F (36.8 C)] 97.3 F (36.3 C) (01/25 0800) Pulse Rate:  [56-156] 86 (01/25 1100) Resp:  [12-35] 17 (01/25 1100) BP: (57-223)/(38-171) 103/70 (01/25 1100) SpO2:  [87 %-100 %] 94 % (01/25 1100) Arterial Line BP: (40-167)/(31-102) 107/68 (01/25 0300)  General - Well nourished, well developed, no acute distress  NEURO:  Mental Status: AA&Ox3  Speech/Language: speech is without dysarthria or aphasia.    Cranial Nerves:  II: PERRL. Visual fields full.  III, IV, VI: EOMI. Eyelids elevate symmetrically.  V: Sensation is intact to light touch and symmetrical to face.  VII: Smile is symmetrical.  VIII: hearing intact to voice. IX, X: Phonation is normal.  XII: tongue is midline without fasciculations. Motor: 5/5 strength to all muscle groups tested.  Tone: is normal and bulk is normal Sensation- Intact to light touch bilaterally.  Coordination: FTN intact bilaterally.  No drift.  Gait- deferred    ASSESSMENT/PLAN Wayne Ortiz is a 76 y.o. male with history of PAF on Eliquis, CAD s/p DES, GERD, BPH, gout and hypothyroidism presenting with three episodes of amaurosis fugax, one three months ago and two in the two days before admission.  He reports that during these episodes,  a shadow  or curtain came across his field of vision.  His ophthalmologist sent him to the ED.  Patient has 70% stenosis of the left carotid artery, and this is likely the cause of his symptoms.  CEA was performed 1/24, and patient was hypotensive after the procedure.  He is now hemodynamically stable.  Stroke team will sign off for now but will remain available for any questions or concerns.  Left amaurosis fugax due to left ICA high-grade stenosis CT head No acute abnormality. Small vessel disease. Atrophy.  CTA head & neck 70% stenosis of distal left common carotid artery MRI  no acute abnormality in brain or orbits.  Mild to moderate signal changes in cerebral white matter dur to small vessel disease Carotid Doppler  1-39% stenosis in bilateral carotid arteries 2D Echo EF 24%, grade 1 diastolic dysfunction, no atrial level shunt. LDL 125 HgbA1c 5.7 VTE prophylaxis - fully anticoagulated with heparin Eliquis (apixaban) daily prior to admission, Eliquis resumed as procedure is complete Therapy recommendations: no PT/OT follow up Disposition:  pending  PAF On Eliquis PTA Was on heparin IV prior to surgery Rate controlled Eliquis resumed postop  Hx of hypertension Hypotension post op Home meds:  metoprolol 25 mg BID Stable now Off all pressors Long-term BP goal normotensive Avoid low BP  Hyperlipidemia Home meds:  none LDL 125, goal < 70 Add zetia 10 mg daily  Previous statin intolerance Continue zetia on discharge  Other Stroke Risk Factors Advanced Age >/= 22  Former cigarette smoker Obesity, Body mass index is 33.37 kg/m., BMI >/= 30 associated with increased stroke risk, recommend weight loss, diet and exercise as appropriate   Other Active Problems Hypothyroidism Continue home levothyroxine  Hospital day # Shirley , MSN, AGACNP-BC [facility] See Amion for schedule and pager information 05/22/2021 12:09 PM   ATTENDING NOTE: I reviewed above  note and agree with the assessment and plan. Pt was seen and examined.   Pt sitting in chair, awake alert , joking around. Neuro intact. His BP stabilized and pressors are off now. Eliquis restarted and heparin IV has discontinued. On zetia. PT/OT no recs. OK to discharge from neuro standpoint.  For detailed assessment and plan, please refer to above as I have made changes wherever appropriate.   Neurology will sign off. Please call with questions. Pt will follow up with stroke clinic NP at [facility] in about 4 weeks. Thanks for the consult.   Rosalin Hawking, MD PhD Stroke Neurology 05/22/2021 12:27 PM     To contact Stroke Continuity provider, please refer to [email or web address] After hours, contact General Neurology

## 2021-05-22 NOTE — Consult Note (Signed)
NAME:  Wayne Ortiz, MRN:  [id number], DOB:  1945/07/06, LOS: 4 ADMISSION DATE:  05/17/2021, CONSULTATION DATE: 05/21/2021 REFERRING MD: Vascular surgery, CHIEF COMPLAINT: Hypotension  History of Present Illness:  76 year old male with well-documented past medical history listed below who presents with TIA underwent urgent left carotid endarterectomy for extensive blockage of the left carotid.  Following surgery at hypotensive state requiring vasopressor support fluid resuscitation pulmonary critical care was called to the bedside.  He is awake and alert and following commands.  Twelve-lead EKG and stat bedside echo being performed. Transferred to intensive care for further evaluation and treatment.  Now off vasopressors since 4 AM. Sitting up in bed.  Pertinent  Medical History   Past Medical History:  Diagnosis Date   Arthritis    Atrial fibrillation (HCC)    BPH (benign prostatic hypertrophy)    CAD (coronary artery disease)    a. s/p DES 05/2014 at [place] [facility])    Complication of anesthesia    BECAME COMBATIVE   Dyspnea    Dysrhythmia    Femur fracture (HCC)    GERD (gastroesophageal reflux disease)    occasional TUMS   Gout    History of gout    History of kidney stones    Hypertension    Hypothyroidism    Persistent atrial fibrillation ([place])    Precancerous skin lesion    Renal disorder      Significant Hospital Events: Including procedures, antibiotic start and stop dates in addition to other pertinent events   05/21/2021 hypotension following left carotid endarterectomy  Interim History / Subjective:  No acute event overnight  Subjective: complaints of scratchy throat and feeling voice is "different" Denies chest pain, sob, or feelings of throat tightness.   Objective   Blood pressure 132/77, pulse 93, temperature 97.6 F (36.4 C), temperature source Oral, resp. rate 20, height 6' (1.829 m), weight 111.6 kg, SpO2 95 %.        Intake/Output Summary  (Last 24 hours) at 05/22/2021 0820 Last data filed at 05/22/2021 0700 Gross per 24 hour  Intake 2181.36 ml  Output 2100 ml  Net 81.36 ml   Filed Weights   05/18/21 0900 05/21/21 0734  Weight: 111.6 kg 111.6 kg    Examination: General: In bed, NAD, appears comfortable HEENT: MM pink/moist, anicteric, atraumatic Neuro: RASS 0, PERRL 57mm, GCS 15 CV: S1S2, Afib, no m/r/g appreciated PULM:  clear in the upper lobes, clear in the lower lobes, trachea midline, chest expansion symmetric GI: soft, bsx4 active, non-tender   Extremities: warm/dry, no pretibial edema, capillary refill less than 3 seconds  Skin: L ecchymosis to surgical site on neck,  no rashes or lesions noted  Resolved Hospital Problem list     Assessment & Plan:  Acute hypotensive crisis following left carotid endarterectomy 05/21/2021- Resolved Unclear etiology, unclear if reaction to vancomycin. Off vasoactive agents since 1/25 at 0400. Troponin WNL. BNP 113. HGB WNL. ECHO EF 70-75% LV hyperdynamic -Ok to d/c from ICU -DC central access -VVS following  POD 1 L carotid endarterectomy -Management per vascular -On ASA 81  Chronic atrial fibrillation HX HTN HX HLD CAD S/P RCA and CFX DES (Feb 2016) -Ok to resume eliquis today per vascular -On metoprolol 25 TID -Furosemide 40 daily -ezetimibe, niacin  L amaurosis fugax d/t L ICA high grade stenosis -Neurology consulted and following -Start eliquis today  GERD -PPI  Hypothyroidism -Synthroid   Best Practice (right click and "Reselect all SmartList Selections"  daily)   Diet/type: Regular consistency (see orders) DVT prophylaxis: DOAC GI prophylaxis: [name] and PPI Lines: [name] Foley:  [name] Code Status:  full code Last date of multidisciplinary goals of care discussion [tbd]    Critical care time: n/a min    [employer]., MSN, APRN, AGACNP-BC [facility]  05/22/2021 , 8:20 AM  Please see [email or web address] for pager  details  If no response, please call [phone] After hours, please call Elink at [phone]

## 2021-05-22 NOTE — Progress Notes (Signed)
PHARMACIST LIPID MONITORING   Wayne Ortiz is a 76 y.o. male admitted on 05/17/2021 with partial vision loss.  Pharmacy has been consulted to optimize lipid-lowering therapy with the indication of secondary prevention for clinical ASCVD.  Recent Labs:  Lipid Panel (last 6 months):   Lab Results  Component Value Date   CHOL 99 05/22/2021   TRIG 37 05/22/2021   HDL 38 (L) 05/22/2021   CHOLHDL 2.6 05/22/2021   VLDL 7 05/22/2021   LDLCALC 54 05/22/2021    Hepatic function panel (last 6 months):   Lab Results  Component Value Date   AST 26 05/22/2021   ALT 23 05/22/2021   ALKPHOS 29 (L) 05/22/2021   BILITOT 0.6 05/22/2021    SCr (since admission):   Serum creatinine: 1.12 mg/dL 05/22/21 0340 Estimated creatinine clearance: 72.4 mL/min  Current therapy and lipid therapy tolerance Current lipid-lowering therapy: Zetia and Niacin Previous lipid-lowering therapies (if applicable): Lipitor and Crestor Documented or reported allergies or intolerances to lipid-lowering therapies (if applicable): myalgias  Assessment:   Patient prefers no changes in lipid-lowering therapy at this time due to intolerance  Plan:    1.Statin intensity (high intensity recommended for all patients regardless of the LDL):  Statin intolerance noted. No statin changes due to serious side effects (ex. Myalgias with at least 2 different statins).  2.Add ezetimibe (if any one of the following):   Not indicated at this time.  3.Refer to lipid clinic:   No  4.Follow-up with:  Primary care provider - Celene Squibb, MD  5.Follow-up labs after discharge:  No changes in lipid therapy, repeat a lipid panel in one year.      Wayne Ortiz D. Mina Marble, PharmD, BCPS, [place] 05/22/2021, 1:58 PM

## 2021-05-23 DIAGNOSIS — G459 Transient cerebral ischemic attack, unspecified: Secondary | ICD-10-CM | POA: Diagnosis not present

## 2021-05-23 LAB — CBC
HCT: 38 % — ABNORMAL LOW (ref 39.0–52.0)
Hemoglobin: 12.3 g/dL — ABNORMAL LOW (ref 13.0–17.0)
MCH: 33.6 pg (ref 26.0–34.0)
MCHC: 32.4 g/dL (ref 30.0–36.0)
MCV: 103.8 fL — ABNORMAL HIGH (ref 80.0–100.0)
Platelets: 88 10*3/uL — ABNORMAL LOW (ref 150–400)
RBC: 3.66 MIL/uL — ABNORMAL LOW (ref 4.22–5.81)
RDW: 13.9 % (ref 11.5–15.5)
WBC: 7.5 10*3/uL (ref 4.0–10.5)
nRBC: 0 % (ref 0.0–0.2)

## 2021-05-23 LAB — BASIC METABOLIC PANEL
Anion gap: 5 (ref 5–15)
BUN: 20 mg/dL (ref 8–23)
CO2: 25 mmol/L (ref 22–32)
Calcium: 8.4 mg/dL — ABNORMAL LOW (ref 8.9–10.3)
Chloride: 110 mmol/L (ref 98–111)
Creatinine, Ser: 1.24 mg/dL (ref 0.61–1.24)
GFR, Estimated: >60 mL/min (ref >60)
Glucose, Bld: 95 mg/dL (ref 70–99)
Potassium: 4.3 mmol/L (ref 3.5–5.1)
Sodium: 140 mmol/L (ref 135–145)

## 2021-05-23 LAB — MAGNESIUM: Magnesium: 2.1 mg/dL (ref 1.7–2.4)

## 2021-05-23 MED ORDER — ASPIRIN 81 MG PO TBEC: 81.0000 mg | DELAYED_RELEASE_TABLET | Freq: Every day | ORAL | 0 refills | Status: DC

## 2021-05-23 MED ORDER — EZETIMIBE 10 MG PO TABS: 10.0000 mg | ORAL_TABLET | Freq: Every day | ORAL | 1 refills | Status: DC

## 2021-05-23 MED ORDER — NIACIN 100 MG PO TABS: 100.0000 mg | ORAL_TABLET | Freq: Two times a day (BID) | ORAL | 0 refills | Status: DC

## 2021-05-23 NOTE — Progress Notes (Signed)
Pccm signing off

## 2021-05-23 NOTE — TOC Transition Note (Signed)
Transition of Care [facility]) - CM/SW Discharge Note   Patient Details  Name: ALAIN DESCHENE MRN: [id number] Date of Birth: 09-03-1945  [facility] CM/SW Contact:  Pollie Friar, RN Phone Number: 05/23/2021, 11:18 AM   Clinical Narrative:    Patient discharging home with self care. No f/u per PT/OT.  Pt has transport home.    Final next level of care: Home/Self Care Barriers to Discharge: No Barriers Identified   Patient Goals and CMS Choice        Discharge Placement                       Discharge Plan and Services                                     Social Determinants of Health (SDOH) Interventions     Readmission Risk Interventions No flowsheet data found.

## 2021-05-23 NOTE — Progress Notes (Signed)
Nsg Discharge Note  Admit Date:  05/17/2021 Discharge date: 05/23/2021   Wayne Ortiz to be D/C'd Home per MD order.  AVS completed. Patient/caregiver able to verbalize understanding.  Discharge Medication: Allergies as of 05/23/2021       Reactions   Amiodarone Shortness Of Breath   Atorvastatin Other (See Comments)   Arthralgias   Dilaudid [hydromorphone] Other (See Comments)   Shaking all over (chills/Malaise)   Hydrocodone Hives   Lisinopril Other (See Comments)   CP and SOB   Metoprolol Other (See Comments)   CP and SOb   Rosuvastatin Other (See Comments)   Myalgias   Diltiazem Rash   Rocephin [ceftriaxone Sodium In Dextrose] Rash        Medication List     TAKE these medications    acetaminophen 500 MG tablet Commonly known as: TYLENOL Take 1,000 mg by mouth every 6 (six) hours as needed for mild pain or fever.   allopurinol 300 MG tablet Commonly known as: ZYLOPRIM Take 300 mg by mouth every morning.   aspirin 81 MG EC tablet Take 1 tablet (81 mg total) by mouth daily. Swallow whole. Start taking on: May 24, 2021   docusate sodium 100 MG capsule Commonly known as: COLACE Take 100-200 mg by mouth 2 (two) times daily as needed for constipation.   Eliquis 5 MG Tabs tablet Generic drug: apixaban TAKE 1 TABLET BY MOUTH TWICE A DAY What changed: how much to take   ezetimibe 10 MG tablet Commonly known as: ZETIA Take 1 tablet (10 mg total) by mouth daily. Start taking on: May 24, 2021   furosemide 40 MG tablet Commonly known as: LASIX TAKE ONE TABLET BY MOUTH DAILY,MAY TAKE EXTRA TAB DAILY AS NEEDED FOR WEIGHT GAIN/SWELLING What changed: See the new instructions.   gabapentin 300 MG capsule Commonly known as: NEURONTIN Take 900 mg by mouth at bedtime.   HYDROcodone-acetaminophen 10-325 MG tablet Commonly known as: NORCO Take 1 tablet by mouth See admin instructions. Take 1 tablet by mouth every 6 to 8 hours as needed for pain.    metoprolol tartrate 25 MG tablet Commonly known as: LOPRESSOR Take 12.5 mg by mouth 2 (two) times daily.   niacin 100 MG tablet Take 1 tablet (100 mg total) by mouth 2 (two) times daily with a meal.   polyethylene glycol 17 g packet Commonly known as: MIRALAX / GLYCOLAX Take 17 g by mouth daily as needed for mild constipation.   Synthroid 112 MCG tablet Generic drug: levothyroxine Take 112 mcg by mouth daily at 2 am.        Discharge Assessment: Vitals:   05/23/21 1102 05/23/21 1200  BP: 117/80   Pulse: 78   Resp: 14   Temp: 97.6 F (36.4 C)   SpO2: 95% 97%   Skin clean, dry and intact without evidence of skin break down, no evidence of skin tears noted. IV catheter discontinued intact. Site without signs and symptoms of complications - no redness or edema noted at insertion site, patient denies c/o pain - only slight tenderness at site.  Dressing with slight pressure applied.  D/c Instructions-Education: Discharge instructions given to patient/family with verbalized understanding. D/c education completed with patient/family including follow up instructions, medication list, d/c activities limitations if indicated, with other d/c instructions as indicated by MD - patient able to verbalize understanding, all questions fully answered. Patient instructed to return to ED, call 911, or call MD for any changes in condition.  Patient escorted via  WC, and D/C home via private auto.  Atilano Ina, RN 05/23/2021 2:19 PM

## 2021-05-23 NOTE — Progress Notes (Signed)
Doing well from a vascular surgery standpoint..  Voice a bit hoarse. Neck is soft. Cranial nerves otherwise intact.  No focal findings. Follow-up with Dr. Scot Dock in 4 weeks.  Wayne Ortiz. [name], MD [facility] Phone Number: [phone] 05/23/2021 2:34 PM

## 2021-05-23 NOTE — Care Management Important Message (Signed)
Important Message  Patient Details  Name: Wayne Ortiz MRN: [id number] Date of Birth: 06/28/1945   Medicare Important Message Given:  Yes     Hannah Beat 05/23/2021, 1:26 PM

## 2021-05-23 NOTE — Discharge Summary (Signed)
Physician Discharge Summary  STEADMAN PROSPERI BMW:[id number] DOB: Sep 25, 1945 DOA: 05/17/2021  PCP: Celene Squibb, MD  Admit date: 05/17/2021 Discharge date: 05/23/2021  Admitted From: Home Disposition:  Home  Discharge Condition:Stable CODE STATUS:FULL Diet recommendation: Heart Healthy    Brief/Interim Summary: Joshual Terrio  is a 76 y.o. male, with history of atrial fibrillation, BPH, coronary artery disease, GERD, history of gout, hypothyroidism, and more presents ED with a chief complaint of partial vision loss.  According to the patient over the last several weeks he has had partial vision loss which is transient in his left eye, he saw an ophthalmologist and he was asked to come to the ER.  He presented to [facility], ER and was transferred to [facility] for further neurological work-up.  Initial work-up is positive for left ICA 70% lesion which was  likely is the cause of his vision symptoms.  Vascular surgery was consulted and he underwent left carotid endarterectomy.  Post surgery he was hypotensive, did not respond to IV fluids so was transferred to [facility] service, started on pressors.  Currently he is hemodynamically stable.  Pressures discontinued.  PT/OT recommended no follow-up.  He is medically stable for discharge to home today.  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  No CVA on MRI, patient was seen by neurology and vascular surgery ,s/p left carotid endarterectomy on 05/21/20 on VVS.  Continue aspirin, Eliquis, Zetia on discharge.  He will follow-up with neurology, vascular surgery as an outpatient  2.  Dyslipidemia.  Has listed multiple statin allergies.  Placed on  Zetia and niacin   3.  Hypothyroidism.  On Synthroid.   4.  Hypertension.  Continue beta-blocker at home dose.   5.  Chronic pain.  Continue home medications   6.  Paroxysmal atrial fibrillation [place] vas 2  score of greater than 3.  On beta-blocker.  Continue Eliquis   7.  Chronic diastolic CHF with preserved EF of 55%.  Echo shows no wall motion abnormality, compensated.  Takes Lasix 40 mg daily at home  8.  Postoperative hypotension: Had to be started on pressures and transferred to [facility] service.  Currently hemodynamically stable.  Restarted home metoprolol.  Discharge Diagnoses:  Principal Problem:   TIA (transient ischemic attack) Active Problems:   CAD S/P RCA and CFX DES Feb 2016   Persistent atrial fibrillation (HCC)   Essential hypertension   Hypothyroidism   Shock ([place])   History of left-sided carotid endarterectomy   IPF (idiopathic pulmonary fibrosis) [facility])    Discharge Instructions  Discharge Instructions     Ambulatory referral to Neurology   Complete by: As directed    Follow up with stroke clinic NP (Jessica Vanschaick or Cecille Rubin, if both not available, consider Zachery Dauer, or Ahern) at [facility] in about 4 weeks. Thanks.   Diet - low sodium heart healthy   Complete by: As directed    Discharge instructions   Complete by: As directed    1)Please take your medications as instructed 2)Follow up with [facility] neurology as an outpatient in 4 weeks.  Name and number of the provider group has been attached 3)Follow up with vascular surgery as an outpatient   Increase activity slowly   Complete by: As directed    No wound care   Complete by: As directed       Allergies as of 05/23/2021  Reactions   Amiodarone Shortness Of Breath   Atorvastatin Other (See Comments)   Arthralgias   Dilaudid [hydromorphone] Other (See Comments)   Shaking all over (chills/Malaise)   Hydrocodone Hives   Lisinopril Other (See Comments)   CP and SOB   Metoprolol Other (See Comments)   CP and SOb   Rosuvastatin Other (See Comments)   Myalgias   Diltiazem Rash   Rocephin [ceftriaxone Sodium In Dextrose] Rash        Medication List     TAKE these medications     acetaminophen 500 MG tablet Commonly known as: TYLENOL Take 1,000 mg by mouth every 6 (six) hours as needed for mild pain or fever.   allopurinol 300 MG tablet Commonly known as: ZYLOPRIM Take 300 mg by mouth every morning.   aspirin 81 MG EC tablet Take 1 tablet (81 mg total) by mouth daily. Swallow whole. Start taking on: May 24, 2021   docusate sodium 100 MG capsule Commonly known as: COLACE Take 100-200 mg by mouth 2 (two) times daily as needed for constipation.   Eliquis 5 MG Tabs tablet Generic drug: apixaban TAKE 1 TABLET BY MOUTH TWICE A DAY What changed: how much to take   ezetimibe 10 MG tablet Commonly known as: ZETIA Take 1 tablet (10 mg total) by mouth daily. Start taking on: May 24, 2021   furosemide 40 MG tablet Commonly known as: LASIX TAKE ONE TABLET BY MOUTH DAILY,MAY TAKE EXTRA TAB DAILY AS NEEDED FOR WEIGHT GAIN/SWELLING What changed: See the new instructions.   gabapentin 300 MG capsule Commonly known as: NEURONTIN Take 900 mg by mouth at bedtime.   HYDROcodone-acetaminophen 10-325 MG tablet Commonly known as: NORCO Take 1 tablet by mouth See admin instructions. Take 1 tablet by mouth every 6 to 8 hours as needed for pain.   metoprolol tartrate 25 MG tablet Commonly known as: LOPRESSOR Take 12.5 mg by mouth 2 (two) times daily.   niacin 100 MG tablet Take 1 tablet (100 mg total) by mouth 2 (two) times daily with a meal.   polyethylene glycol 17 g packet Commonly known as: MIRALAX / GLYCOLAX Take 17 g by mouth daily as needed for mild constipation.   Synthroid 112 MCG tablet Generic drug: levothyroxine Take 112 mcg by mouth daily at 2 am.        Follow-up Information     [facility]. Schedule an appointment as soon as possible for a visit in 1 month(s).   Specialty: Neurology Why: stroke clinic Contact information: [address] [place] [phone]         Celene Squibb, MD. Schedule an appointment as soon as possible for a visit in 1 week(s).   Specialty: Internal Medicine Contact information: [place] [address] [phone]                Allergies  Allergen Reactions   Amiodarone Shortness Of Breath   Atorvastatin Other (See Comments)    Arthralgias   Dilaudid [Hydromorphone] Other (See Comments)    Shaking all over (chills/Malaise)   Hydrocodone Hives   Lisinopril Other (See Comments)    CP and SOB   Metoprolol Other (See Comments)    CP and SOb   Rosuvastatin Other (See Comments)    Myalgias   Diltiazem Rash   Rocephin [Ceftriaxone Sodium In Dextrose] Rash    Consultations: Neurology, PCCM   Procedures/Studies: CT Angio Head W or Wo  Contrast  Result Date: 05/17/2021 CLINICAL DATA:  Neuro deficit, stroke suspected, visual problems EXAM: CT ANGIOGRAPHY HEAD AND NECK TECHNIQUE: Multidetector CT imaging of the head and neck was performed using the standard protocol during bolus administration of intravenous contrast. Multiplanar CT image reconstructions and MIPs were obtained to evaluate the vascular anatomy. Carotid stenosis measurements (when applicable) are obtained utilizing NASCET criteria, using the distal internal carotid diameter as the denominator. RADIATION DOSE REDUCTION: This exam was performed according to the departmental dose-optimization program which includes automated exposure control, adjustment of the mA and/or kV according to patient size and/or use of iterative reconstruction technique. CONTRAST:  28mL OMNIPAQUE IOHEXOL 350 MG/ML SOLN COMPARISON:  Prior CTA, correlation is made with CT head 05/17/2021 FINDINGS: CT HEAD FINDINGS For noncontrast findings, please see same day CT head. CTA NECK FINDINGS Aortic arch: Two-vessel arch with a common origin of the brachiocephalic and left common carotid arteries. Imaged portion shows no evidence of aneurysm or dissection. No significant  stenosis of the major arch vessel origins. Aortic atherosclerosis Right carotid system: No evidence of dissection, stenosis (50% or greater) or occlusion. Left carotid system: Approximately 70% stenosis in the distal left common carotid and at the bifurcation (series 7, image 223) without hemodynamically significant stenosis in the proximal left ICA Vertebral arteries: Mild calcifications in the right V1 segment, which are not hemodynamically significant. The left vertebral artery is somewhat small but otherwise unremarkable. No evidence of dissection or occlusion. Skeleton: 5 mm anterolisthesis of C3 on C4, which appears degenerative and longstanding. Status post ACDF C5-T1. No acute osseous abnormality. Other neck: Negative. Upper chest: Apical pleural-parenchymal scarring. No pleural effusion. Review of the MIP images confirms the above findings CTA HEAD FINDINGS Anterior circulation: Both internal carotid arteries are patent to the termini, with calcifications in the bilateral cavernous and supraclinoid segments, which cause mild stenosis. A1 segments patent. Normal anterior communicating artery. Anterior cerebral arteries are patent to their distal aspects. No M1 stenosis or occlusion. Normal MCA bifurcations. Distal MCA branches perfused and symmetric. Posterior circulation: The left vertebral artery primarily supplies PICA. The right vertebral artery is patent to the vertebrobasilar junction, with minimal calcifications which are not hemodynamically significant. Posterior inferior cerebral arteries patent bilaterally. Basilar patent to its distal aspect. Superior cerebellar arteries patent bilaterally. Bilateral P1 segments originate from the basilar artery. The right posterior communicating artery is visualized. The left posterior communicating artery is not definitively seen. PCAs perfused to their distal aspects without stenosis. Venous sinuses: As permitted by contrast timing, patent. Anatomic variants:  None significant Review of the MIP images confirms the above findings IMPRESSION: 1. No intracranial large vessel occlusion. Mild stenosis in the right cavernous and supraclinoid ICA without additional hemodynamically significant intracranial stenosis. 2. Approximately 70% stenosis in the distal left common carotid artery and at the bifurcation, without significant stenosis in the proximal left ICA. No other hemodynamically significant stenosis in the neck. Electronically Signed   By: Merilyn Baba M.D.   On: 05/17/2021 19:38   CT HEAD WO CONTRAST  Result Date: 05/17/2021 CLINICAL DATA:  Visual problems for 2 months, most recently 2 days ago. EXAM: CT HEAD WITHOUT CONTRAST TECHNIQUE: Contiguous axial images were obtained from the base of the skull through the vertex without intravenous contrast. RADIATION DOSE REDUCTION: This exam was performed according to the departmental dose-optimization program which includes automated exposure control, adjustment of the mA and/or kV according to patient size and/or use of iterative reconstruction technique. COMPARISON:  None. FINDINGS: Brain: There  is no evidence for acute hemorrhage, hydrocephalus, mass lesion, or abnormal extra-axial fluid collection. No definite CT evidence for acute infarction. Diffuse loss of parenchymal volume is consistent with atrophy. Patchy low attenuation in the deep hemispheric and periventricular white matter is nonspecific, but likely reflects chronic microvascular ischemic demyelination. Vascular: No hyperdense vessel or unexpected calcification. Skull: No evidence for fracture. No worrisome lytic or sclerotic lesion. Sinuses/Orbits: The visualized paranasal sinuses and mastoid air cells are clear. Visualized portions of the globes and intraorbital fat are unremarkable. Other: None. IMPRESSION: 1. No acute intracranial abnormality. 2. Atrophy with chronic small vessel ischemic disease. Electronically Signed   By: Misty Stanley M.D.   On:  05/17/2021 17:31   CT Angio Neck W and/or Wo Contrast  Result Date: 05/17/2021 CLINICAL DATA:  Neuro deficit, stroke suspected, visual problems EXAM: CT ANGIOGRAPHY HEAD AND NECK TECHNIQUE: Multidetector CT imaging of the head and neck was performed using the standard protocol during bolus administration of intravenous contrast. Multiplanar CT image reconstructions and MIPs were obtained to evaluate the vascular anatomy. Carotid stenosis measurements (when applicable) are obtained utilizing NASCET criteria, using the distal internal carotid diameter as the denominator. RADIATION DOSE REDUCTION: This exam was performed according to the departmental dose-optimization program which includes automated exposure control, adjustment of the mA and/or kV according to patient size and/or use of iterative reconstruction technique. CONTRAST:  79mL OMNIPAQUE IOHEXOL 350 MG/ML SOLN COMPARISON:  Prior CTA, correlation is made with CT head 05/17/2021 FINDINGS: CT HEAD FINDINGS For noncontrast findings, please see same day CT head. CTA NECK FINDINGS Aortic arch: Two-vessel arch with a common origin of the brachiocephalic and left common carotid arteries. Imaged portion shows no evidence of aneurysm or dissection. No significant stenosis of the major arch vessel origins. Aortic atherosclerosis Right carotid system: No evidence of dissection, stenosis (50% or greater) or occlusion. Left carotid system: Approximately 70% stenosis in the distal left common carotid and at the bifurcation (series 7, image 223) without hemodynamically significant stenosis in the proximal left ICA Vertebral arteries: Mild calcifications in the right V1 segment, which are not hemodynamically significant. The left vertebral artery is somewhat small but otherwise unremarkable. No evidence of dissection or occlusion. Skeleton: 5 mm anterolisthesis of C3 on C4, which appears degenerative and longstanding. Status post ACDF C5-T1. No acute osseous abnormality.  Other neck: Negative. Upper chest: Apical pleural-parenchymal scarring. No pleural effusion. Review of the MIP images confirms the above findings CTA HEAD FINDINGS Anterior circulation: Both internal carotid arteries are patent to the termini, with calcifications in the bilateral cavernous and supraclinoid segments, which cause mild stenosis. A1 segments patent. Normal anterior communicating artery. Anterior cerebral arteries are patent to their distal aspects. No M1 stenosis or occlusion. Normal MCA bifurcations. Distal MCA branches perfused and symmetric. Posterior circulation: The left vertebral artery primarily supplies PICA. The right vertebral artery is patent to the vertebrobasilar junction, with minimal calcifications which are not hemodynamically significant. Posterior inferior cerebral arteries patent bilaterally. Basilar patent to its distal aspect. Superior cerebellar arteries patent bilaterally. Bilateral P1 segments originate from the basilar artery. The right posterior communicating artery is visualized. The left posterior communicating artery is not definitively seen. PCAs perfused to their distal aspects without stenosis. Venous sinuses: As permitted by contrast timing, patent. Anatomic variants: None significant Review of the MIP images confirms the above findings IMPRESSION: 1. No intracranial large vessel occlusion. Mild stenosis in the right cavernous and supraclinoid ICA without additional hemodynamically significant intracranial stenosis. 2. Approximately 70% stenosis  in the distal left common carotid artery and at the bifurcation, without significant stenosis in the proximal left ICA. No other hemodynamically significant stenosis in the neck. Electronically Signed   By: Merilyn Baba M.D.   On: 05/17/2021 19:38   CT Angio Chest Pulmonary Embolism (PE) W or WO Contrast  Result Date: 05/21/2021 CLINICAL DATA:  Chest pain, shortness of breath EXAM: CT ANGIOGRAPHY CHEST WITH CONTRAST  TECHNIQUE: Multidetector CT imaging of the chest was performed using the standard protocol during bolus administration of intravenous contrast. Multiplanar CT image reconstructions and MIPs were obtained to evaluate the vascular anatomy. RADIATION DOSE REDUCTION: This exam was performed according to the departmental dose-optimization program which includes automated exposure control, adjustment of the mA and/or kV according to patient size and/or use of iterative reconstruction technique. CONTRAST:  55mL OMNIPAQUE IOHEXOL 350 MG/ML SOLN COMPARISON:  Previous studies including chest radiograph done earlier today FINDINGS: Cardiovascular: Coronary artery calcifications are seen. Coronary artery stents are noted in the right coronary. Heart is enlarged in size. There is homogeneous enhancement in thoracic aorta. There are no intraluminal filling defects in the pulmonary artery branches. Mediastinum/Nodes: No significant lymphadenopathy seen. Thyroid is not distinctly visualized. Lungs/Pleura: There is no focal pulmonary consolidation. Small bilateral pleural effusions are seen. Increased interstitial markings are seen in the anterior right upper lung fields. There are patchy faint ground-glass densities in the mid and lower lung fields. Linear densities seen in the left lower lung fields. Upper Abdomen: There is nodularity in the liver surface. There are low-density lesions in the renal cortex largest measuring 2.9 cm in the upper pole of left kidney. There is 9 mm exophytic lesion in the anterior upper pole of right kidney with density measurements higher than usual for simple cyst. Musculoskeletal: Unremarkable. Review of the MIP images confirms the above findings. IMPRESSION: There is no evidence of pulmonary artery embolism. There is no evidence of thoracic aortic dissection. There are increased interstitial markings in the right upper lung fields anteriorly, possibly suggesting scarring. There are faint  ground-glass densities in the parahilar regions and lower lung fields suggesting scarring or mild interstitial edema or interstitial pneumonia. Small bilateral pleural effusions are seen. There is 2.9 cm left renal cyst. There is 9 mm exophytic lesion in the anterior upper pole of right kidney with density measurement higher than usual for simple cyst. Follow-up renal sonogram or multiphasic CT may be considered. There is mild nodularity in the liver surface which may be normal variation or suggest cirrhosis. Coronary artery disease. Electronically Signed   By: Elmer Picker M.D.   On: 05/21/2021 16:33   MR BRAIN W WO CONTRAST  Result Date: 05/18/2021 CLINICAL DATA:  76 year old male with vision problems for the past 2 months. Neurologic deficit. EXAM: MRI HEAD AND ORBITS WITHOUT AND WITH CONTRAST TECHNIQUE: Multiplanar, multiecho pulse sequences of the brain and surrounding structures were obtained without and with intravenous contrast. Multiplanar, multiecho pulse sequences of the orbits and surrounding structures were obtained including fat saturation techniques, before and after intravenous contrast administration. CONTRAST:  64mL GADAVIST GADOBUTROL 1 MMOL/ML IV SOLN COMPARISON:  CTA head and neck and head CT yesterday. FINDINGS: MRI HEAD FINDINGS Brain: No restricted diffusion to suggest acute infarction. No midline shift, mass effect, evidence of mass lesion, ventriculomegaly, extra-axial collection or acute intracranial hemorrhage. Cervicomedullary junction and pituitary are within normal limits. Scattered patchy cerebral white matter T2 and FLAIR hyperintensity in both hemispheres, mild to moderate for age. No cortical encephalomalacia or chronic cerebral  blood products identified. 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 Mild T2 heterogeneity in the bilateral basal ganglia. Thalami, brainstem and cerebellum are within normal limits. No abnormal enhancement identified. No  dural thickening. Vascular: Major intracranial vascular flow voids are preserved. Dominant right vertebral artery as seen on CTA. The major dural venous sinuses are enhancing and appear patent. Skull and upper cervical spine: Negative visible cervical spine. Visualized bone marrow signal is within normal limits. Other: Right anterior scalp susceptibility artifact related to punctate metallic foreign body retained as seen on recent head CT. But otherwise negative visible scalp and face soft tissues. Visible internal auditory structures appear normal. MRI ORBITS FINDINGS Orbits: Normal suprasellar cistern and optic chiasm. Cavernous sinus appears symmetric and normal. Bilateral optic nerves appear symmetric and within normal limits. No intraorbital mass or inflammation. No abnormal intraorbital enhancement identified. Postoperative changes to both globes which otherwise appear normal. Visualized sinuses: Negative. Soft tissues: Negative periorbital and visible deep soft tissue spaces of the face. IMPRESSION: 1. Normal MRI appearance of the orbits. 2. No acute intracranial abnormality. Mild to moderate for age signal changes in the cerebral white matter and basal ganglia most commonly due to chronic small vessel disease. Electronically Signed   By: Genevie Ann M.D.   On: 05/18/2021 07:09   DG CHEST PORT 1 VIEW  Result Date: 05/21/2021 CLINICAL DATA:  Status post PICC central line placement. EXAM: PORTABLE CHEST 1 VIEW COMPARISON:  Radiographs 03/25/2019 and 03/04/2019.  CT 03/28/2016. FINDINGS: 1314 hours. The heart size is at the upper limits of normal for portable AP semi supine technique. Small caliber catheter overlies the left clavicle, with the tip projected superior to the aortic arch, likely within the left brachiocephalic vein. No evidence of pneumothorax or significant pleural effusion. There is chronic elevation of the right hemidiaphragm. Mild pulmonary scarring is present, improved from prior radiographs.  Patient is status post lower cervical fusion. IMPRESSION: 1. Tip of the left subclavian central venous catheter projects superior to the aortic arch, likely within the left brachiocephalic vein. Correlate clinically. 2. No evidence of pneumothorax. 3. Mild pulmonary scarring, improved from prior radiographs. Electronically Signed   By: Richardean Sale M.D.   On: 05/21/2021 13:27   ECHOCARDIOGRAM COMPLETE  Result Date: 05/18/2021    ECHOCARDIOGRAM REPORT   Patient Name:   MORLEY GAUMER Date of Exam: 05/18/2021 Medical Rec #:  [id number]     Height:       72.0 in Accession #:    [id number]    Weight:       257.9 lb Date of Birth:  Jun 18, 1945      BSA:          2.374 m Patient Age:    35 years      BP:           118/91 mmHg Patient Gender: M             HR:           91 bpm. Exam Location:  Inpatient Procedure: 2D Echo, Cardiac Doppler and Color Doppler Indications:    TIA  History:        Patient has prior history of Echocardiogram examinations, most                 recent 12/01/2014. CAD, Arrythmias:Atrial Fibrillation,                 Signs/Symptoms:Dyspnea; Risk Factors:Hypertension. CA/  DYSRHYTHMIA.  Sonographer:    Beryle Beams Referring Phys: [id number] ASIA B [place] IMPRESSIONS  1. Left ventricular ejection fraction, by estimation, is 55%. The left ventricle has normal function. The left ventricle has no regional wall motion abnormalities. There is mild left ventricular hypertrophy. Left ventricular diastolic parameters are consistent with Grade I diastolic dysfunction (impaired relaxation).  2. Right ventricular systolic function is moderately reduced. The right ventricular size is moderately enlarged. There is normal pulmonary artery systolic pressure.  3. The mitral valve is abnormal. Trivial mitral valve regurgitation. No evidence of mitral stenosis.  4. The aortic valve is tricuspid. There is mild calcification of the aortic valve. Aortic valve regurgitation is not visualized. Aortic  valve sclerosis is present, with no evidence of aortic valve stenosis.  5. The inferior vena cava is normal in size with greater than 50% respiratory variability, suggesting right atrial pressure of 3 mmHg. FINDINGS  Left Ventricle: Left ventricular ejection fraction, by estimation, is 55%. The left ventricle has normal function. The left ventricle has no regional wall motion abnormalities. The left ventricular internal cavity size was normal in size. There is mild left ventricular hypertrophy. Left ventricular diastolic parameters are consistent with Grade I diastolic dysfunction (impaired relaxation). Right Ventricle: The right ventricular size is moderately enlarged. No increase in right ventricular wall thickness. Right ventricular systolic function is moderately reduced. There is normal pulmonary artery systolic pressure. The tricuspid regurgitant velocity is 2.74 m/s, and with an assumed right atrial pressure of 3 mmHg, the estimated right ventricular systolic pressure is 67.1 mmHg. Left Atrium: Left atrial size was normal in size. Right Atrium: Right atrial size was normal in size. Pericardium: There is no evidence of pericardial effusion. Mitral Valve: The mitral valve is abnormal. There is mild thickening of the mitral valve leaflet(s). There is mild calcification of the mitral valve leaflet(s). Trivial mitral valve regurgitation. No evidence of mitral valve stenosis. MV peak gradient, 35.3 mmHg. The mean mitral valve gradient is 26.0 mmHg. Tricuspid Valve: The tricuspid valve is normal in structure. Tricuspid valve regurgitation is mild . No evidence of tricuspid stenosis. Aortic Valve: The aortic valve is tricuspid. There is mild calcification of the aortic valve. Aortic valve regurgitation is not visualized. Aortic valve sclerosis is present, with no evidence of aortic valve stenosis. Aortic valve mean gradient measures 2.0 mmHg. Aortic valve peak gradient measures 3.0 mmHg. Aortic valve area, by VTI  measures 1.92 cm. Pulmonic Valve: The pulmonic valve was normal in structure. Pulmonic valve regurgitation is not visualized. No evidence of pulmonic stenosis. Aorta: The aortic root is normal in size and structure. Venous: The inferior vena cava is normal in size with greater than 50% respiratory variability, suggesting right atrial pressure of 3 mmHg. IAS/Shunts: No atrial level shunt detected by color flow Doppler.  LEFT VENTRICLE PLAX 2D LVIDd:         4.00 cm     Diastology LVIDs:         2.00 cm     LV e' medial:    7.58 cm/s LV PW:         1.30 cm     LV E/e' medial:  7.1 LV IVS:        1.20 cm     LV e' lateral:   6.34 cm/s LVOT diam:     2.10 cm     LV E/e' lateral: 8.5 LV SV:         28 LV SV Index:   12 LVOT  Area:     3.46 cm  LV Volumes (MOD) LV vol d, MOD A2C: 51.0 ml LV vol d, MOD A4C: 79.8 ml LV vol s, MOD A4C: 41.6 ml LV SV MOD A4C:     79.8 ml RIGHT VENTRICLE RV S prime:     16.20 cm/s TAPSE (M-mode): 2.0 cm LEFT ATRIUM             Index        RIGHT ATRIUM           Index LA diam:        3.50 cm 1.47 cm/m   RA Area:     11.90 cm LA Vol (A2C):   59.4 ml 25.02 ml/m  RA Volume:   21.30 ml  8.97 ml/m LA Vol (A4C):   45.0 ml 18.96 ml/m LA Biplane Vol: 57.3 ml 24.14 ml/m  AORTIC VALVE                    PULMONIC VALVE AV Area (Vmax):    2.07 cm     PV Vmax:       0.40 m/s AV Area (Vmean):   1.88 cm     PV Vmean:      22.500 cm/s AV Area (VTI):     1.92 cm     PV VTI:        0.043 m AV Vmax:           87.30 cm/s   PV Peak grad:  0.7 mmHg AV Vmean:          60.100 cm/s  PV Mean grad:  0.0 mmHg AV VTI:            0.147 m AV Peak Grad:      3.0 mmHg AV Mean Grad:      2.0 mmHg LVOT Vmax:         52.30 cm/s LVOT Vmean:        32.600 cm/s LVOT VTI:          0.081 m LVOT/AV VTI ratio: 0.55  AORTA Ao Root diam: 3.70 cm Ao Asc diam:  3.50 cm MITRAL VALVE               TRICUSPID VALVE MV Area (PHT): 3.50 cm    TV Peak grad:   28.5 mmHg MV Area VTI:   0.31 cm    TV Mean grad:   21.0 mmHg MV Peak grad:   35.3 mmHg   TV Vmax:        2.67 m/s MV Mean grad:  26.0 mmHg   TV Vmean:       216.0 cm/s MV Vmax:       2.97 m/s    TV VTI:         0.74 msec MV Vmean:      246.0 cm/s  TR Peak grad:   30.0 mmHg MV Decel Time: 217 msec    TR Vmax:        274.00 cm/s MV E velocity: 54.00 cm/s MV A velocity: 30.40 cm/s  SHUNTS MV E/A ratio:  1.78        Systemic VTI:  0.08 m                            Systemic Diam: 2.10 cm Jenkins Rouge MD Electronically signed by Jenkins Rouge MD Signature Date/Time: 05/18/2021/12:59:55 PM    Final    VAS  US CAROTID  Result Date: 05/18/2021 Carotid Arterial Duplex Study Patient Name:  FELIS QUILLIN  Date of Exam:   05/18/2021 Medical Rec #: [id number]      Accession #:    [id number] Date of Birth: 11/05/1945       Patient Gender: M Patient Age:   60 years Exam Location:  [facility] Procedure:      VAS US CAROTID Referring Phys: Harrell Gave DICKSON --------------------------------------------------------------------------------  Indications:       Visual disturbance; three episodes of left amaurosis fugax. Risk Factors:      Hypertension, past history of smoking, coronary artery                    disease. Comparison Study:  No prior study Performing Technologist: Maudry Mayhew MHA, RDMS, RVT, RDCS  Examination Guidelines: A complete evaluation includes B-mode imaging, spectral Doppler, color Doppler, and power Doppler as needed of all accessible portions of each vessel. Bilateral testing is considered an integral part of a complete examination. Limited examinations for reoccurring indications may be performed as noted.  Right Carotid Findings: +----------+--------+--------+--------+--------------------------+--------+             PSV cm/s EDV cm/s Stenosis Plaque Description         Comments  +----------+--------+--------+--------+--------------------------+--------+  CCA Prox   78       21                                                      +----------+--------+--------+--------+--------------------------+--------+  CCA Distal 71       21                irregular and heterogenous           +----------+--------+--------+--------+--------------------------+--------+  ICA Prox   42       12                calcific and smooth                  +----------+--------+--------+--------+--------------------------+--------+  ICA Distal 62       27                                                     +----------+--------+--------+--------+--------------------------+--------+  ECA        83       18                heterogenous and irregular           +----------+--------+--------+--------+--------------------------+--------+ +----------+--------+-------+----------------+-------------------+             PSV cm/s EDV cms Describe         Arm Pressure (mmHG)  +----------+--------+-------+----------------+-------------------+  Subclavian 72               Multiphasic, WNL                      +----------+--------+-------+----------------+-------------------+ +---------+--------+--+--------+--+---------+  Vertebral PSV cm/s 23 EDV cm/s 10 Antegrade  +---------+--------+--+--------+--+---------+  Left Carotid Findings: +----------+--------+--------+--------+-------------------------+---------+             PSV cm/s EDV cm/s Stenosis Plaque Description  Comments   +----------+--------+--------+--------+-------------------------+---------+  CCA Prox   65       22                smooth and heterogenous              +----------+--------+--------+--------+-------------------------+---------+  CCA Distal 78       23       <50%     calcific and focal        Shadowing  +----------+--------+--------+--------+-------------------------+---------+  ICA Prox   36       14       1-39%    heterogenous and calcific Shadowing  +----------+--------+--------+--------+-------------------------+---------+  ICA Mid    55       29                                                      +----------+--------+--------+--------+-------------------------+---------+  ICA Distal 68       31                                                     +----------+--------+--------+--------+-------------------------+---------+  ECA        184      43                                                     +----------+--------+--------+--------+-------------------------+---------+ +----------+--------+--------+----------------+-------------------+             PSV cm/s EDV cm/s Describe         Arm Pressure (mmHG)  +----------+--------+--------+----------------+-------------------+  Subclavian 87                Multiphasic, WNL                      +----------+--------+--------+----------------+-------------------+ +---------+--------+--+--------+-+---------+  Vertebral PSV cm/s 15 EDV cm/s 5 Antegrade  +---------+--------+--+--------+-+---------+   Summary: Right Carotid: Velocities in the right ICA are consistent with a 1-39% stenosis.                Non-hemodynamically significant plaque <50% noted in the CCA. Left Carotid: Velocities in the left ICA are consistent with a 1-39% stenosis.               Non-hemodynamically significant plaque <50% noted in the CCA. Vertebrals:  Bilateral vertebral arteries demonstrate antegrade flow. Subclavians: Normal flow hemodynamics were seen in bilateral subclavian              arteries. *See table(s) above for measurements and observations.  Electronically signed by [name] MD on 05/18/2021 at 12:08:33 PM.    Final    ECHOCARDIOGRAM LIMITED  Result Date: 05/21/2021    ECHOCARDIOGRAM LIMITED REPORT   Patient Name:   KALIK HOARE Date of Exam: 05/21/2021 Medical Rec #:  [id number]     Height:       72.0 in Accession #:    [id number]    Weight:       246.0 lb Date of Birth:  08-16-1945  BSA:          2.327 m Patient Age:    43 years      BP:           111/68 mmHg Patient Gender: M             HR:           97 bpm. Exam Location:  Inpatient Procedure: 2D Echo, Limited  Echo and Cardiac Doppler Indications:    Dyspnea  History:        Patient has prior history of Echocardiogram examinations, most                 recent 05/18/2021. CAD, Arrythmias:Atrial Fibrillation; Risk                 Factors:Hypertension.  Sonographer:    Jefferey Pica Referring Phys: Gil[place]  1. Left ventricular ejection fraction, by estimation, is 70 to 75%. The left ventricle has hyperdynamic function. The left ventricle has no regional wall motion abnormalities.  2. Right ventricular systolic function is moderately reduced. The right ventricular size is mildly enlarged. Tricuspid regurgitation signal is inadequate for assessing PA pressure.  3. A small pericardial effusion is present. The pericardial effusion is anterior to the right ventricle.  4. The mitral valve is grossly normal.  5. The aortic valve was not well visualized. Aortic valve regurgitation is not visualized.  6. The inferior vena cava is normal in size with <50% respiratory variability, suggesting right atrial pressure of 8 mmHg. Comparison(s): Changes from prior study are noted. Compared to prior study, LV appears underfilled and hyperdynamic. FINDINGS  Left Ventricle: Left ventricular ejection fraction, by estimation, is 70 to 75%. The left ventricle has hyperdynamic function. The left ventricle has no regional wall motion abnormalities. The left ventricular internal cavity size was small. Right Ventricle: The right ventricular size is mildly enlarged. Right ventricular systolic function is moderately reduced. Tricuspid regurgitation signal is inadequate for assessing PA pressure. Pericardium: A small pericardial effusion is present. The pericardial effusion is anterior to the right ventricle. Mitral Valve: The mitral valve is grossly normal. Tricuspid Valve: The tricuspid valve is normal in structure. Tricuspid valve regurgitation is not demonstrated. No evidence of tricuspid stenosis. Aortic Valve: The aortic  valve was not well visualized. Aortic valve regurgitation is not visualized. Aorta: The aortic root and ascending aorta are structurally normal, with no evidence of dilitation. Venous: The inferior vena cava is normal in size with less than 50% respiratory variability, suggesting right atrial pressure of 8 mmHg. LEFT VENTRICLE PLAX 2D LVIDd:         3.60 cm LVIDs:         1.70 cm LV PW:         1.45 cm LV IVS:        1.60 cm  RIGHT VENTRICLE RV Basal diam:  3.90 cm RV Mid diam:    3.30 cm LEFT ATRIUM         Index       RIGHT ATRIUM           Index LA diam:    3.90 cm 1.68 cm/m  RA Area:     21.60 cm                                 RA Volume:   65.90 ml  28.32 ml/m   AORTA Ao  Root diam: 3.60 cm Ao Asc diam:  3.90 cm Rudean Haskell MD Electronically signed by Rudean Haskell MD Signature Date/Time: 05/21/2021/2:39:03 PM    Final    MR ORBITS W WO CONTRAST  Result Date: 05/18/2021 CLINICAL DATA:  76 year old male with vision problems for the past 2 months. Neurologic deficit. EXAM: MRI HEAD AND ORBITS WITHOUT AND WITH CONTRAST TECHNIQUE: Multiplanar, multiecho pulse sequences of the brain and surrounding structures were obtained without and with intravenous contrast. Multiplanar, multiecho pulse sequences of the orbits and surrounding structures were obtained including fat saturation techniques, before and after intravenous contrast administration. CONTRAST:  44mL GADAVIST GADOBUTROL 1 MMOL/ML IV SOLN COMPARISON:  CTA head and neck and head CT yesterday. FINDINGS: MRI HEAD FINDINGS Brain: No restricted diffusion to suggest acute infarction. No midline shift, mass effect, evidence of mass lesion, ventriculomegaly, extra-axial collection or acute intracranial hemorrhage. Cervicomedullary junction and pituitary are within normal limits. Scattered patchy cerebral white matter T2 and FLAIR hyperintensity in both hemispheres, mild to moderate for age. No cortical encephalomalacia or chronic cerebral blood  products identified. 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 Mild T2 heterogeneity in the bilateral basal ganglia. Thalami, brainstem and cerebellum are within normal limits. No abnormal enhancement identified. No dural thickening. Vascular: Major intracranial vascular flow voids are preserved. Dominant right vertebral artery as seen on CTA. The major dural venous sinuses are enhancing and appear patent. Skull and upper cervical spine: Negative visible cervical spine. Visualized bone marrow signal is within normal limits. Other: Right anterior scalp susceptibility artifact related to punctate metallic foreign body retained as seen on recent head CT. But otherwise negative visible scalp and face soft tissues. Visible internal auditory structures appear normal. MRI ORBITS FINDINGS Orbits: Normal suprasellar cistern and optic chiasm. Cavernous sinus appears symmetric and normal. Bilateral optic nerves appear symmetric and within normal limits. No intraorbital mass or inflammation. No abnormal intraorbital enhancement identified. Postoperative changes to both globes which otherwise appear normal. Visualized sinuses: Negative. Soft tissues: Negative periorbital and visible deep soft tissue spaces of the face. IMPRESSION: 1. Normal MRI appearance of the orbits. 2. No acute intracranial abnormality. Mild to moderate for age signal changes in the cerebral white matter and basal ganglia most commonly due to chronic small vessel disease. Electronically Signed   By: Genevie Ann M.D.   On: 05/18/2021 07:09      Subjective: Patient seen and examined at the bedside this morning.  Hemodynamically stable for discharge.  Discharge Exam: Vitals:   05/22/21 2313 05/23/21 0313  BP: 119/87 119/89  Pulse: 82 90  Resp: 19 19  Temp: 97.6 F (36.4 C) 97.8 F (36.6 C)  SpO2: 96% 93%   Vitals:   05/22/21 1231 05/22/21 1931 05/22/21 2313 05/23/21 0313  BP: 121/81 104/71 119/87 119/89  Pulse: 77 89 82  90  Resp: 16 19 19 19   Temp: (!) 97.5 F (36.4 C) 98.1 F (36.7 C) 97.6 F (36.4 C) 97.8 F (36.6 C)  TempSrc: Oral Oral Oral Oral  SpO2: 98% 96% 96% 93%  Weight:      Height:        General: Pt is alert, awake, not in acute distress Cardiovascular: RRR, S1/S2 +, no rubs, no gallops Respiratory: CTA bilaterally, no wheezing, no rhonchi Abdominal: Soft, NT, ND, bowel sounds + Extremities: no edema, no cyanosis    The results of significant diagnostics from this hospitalization (including imaging, microbiology, ancillary and laboratory) are listed below for reference.  Microbiology: Recent Results (from the past 240 hour(s))  Resp Panel by RT-PCR (Flu A&B, Covid) Nasopharyngeal Swab     Status: None   Collection Time: 05/17/21  6:29 PM   Specimen: Nasopharyngeal Swab; Nasopharyngeal(NP) swabs in vial transport medium  Result Value Ref Range Status   SARS Coronavirus 2 by RT PCR NEGATIVE NEGATIVE Final    Comment: (NOTE) SARS-CoV-2 target nucleic acids are NOT DETECTED.  The SARS-CoV-2 RNA is generally detectable in upper respiratory specimens during the acute phase of infection. The lowest concentration of SARS-CoV-2 viral copies this assay can detect is 138 copies/mL. A negative result does not preclude SARS-Cov-2 infection and should not be used as the sole basis for treatment or other patient management decisions. A negative result may occur with  improper specimen collection/handling, submission of specimen other than nasopharyngeal swab, presence of viral mutation(s) within the areas targeted by this assay, and inadequate number of viral copies(<138 copies/mL). A negative result must be combined with clinical observations, patient history, and epidemiological information. The expected result is Negative.  Fact Sheet for Patients:  EntrepreneurPulse.com.au  Fact Sheet for Healthcare Providers:  IncredibleEmployment.be  This  test is no t yet approved or cleared by the [place] FDA and  has been authorized for detection and/or diagnosis of SARS-CoV-2 by FDA under an Emergency Use Authorization (EUA). This EUA will remain  in effect (meaning this test can be used) for the duration of the COVID-19 declaration under Section 564(b)(1) of the Act, 21 U.S.C.section 360bbb-3(b)(1), unless the authorization is terminated  or revoked sooner.       Influenza A by PCR NEGATIVE NEGATIVE Final   Influenza B by PCR NEGATIVE NEGATIVE Final    Comment: (NOTE) The Xpert Xpress SARS-CoV-2/FLU/RSV plus assay is intended as an aid in the diagnosis of influenza from Nasopharyngeal swab specimens and should not be used as a sole basis for treatment. Nasal washings and aspirates are unacceptable for Xpert Xpress SARS-CoV-2/FLU/RSV testing.  Fact Sheet for Patients: EntrepreneurPulse.com.au  Fact Sheet for Healthcare Providers: IncredibleEmployment.be  This test is not yet approved or cleared by the [place] FDA and has been authorized for detection and/or diagnosis of SARS-CoV-2 by FDA under an Emergency Use Authorization (EUA). This EUA will remain in effect (meaning this test can be used) for the duration of the COVID-19 declaration under Section 564(b)(1) of the Act, 21 U.S.C. section 360bbb-3(b)(1), unless the authorization is terminated or revoked.  Performed at [facility], [address] [place], [place] [address]   Surgical pcr screen     Status: None   Collection Time: 05/20/21 10:12 PM   Specimen: Nasal Mucosa; Nasal Swab  Result Value Ref Range Status   MRSA, PCR NEGATIVE NEGATIVE Final   Staphylococcus aureus NEGATIVE NEGATIVE Final    Comment: (NOTE) The Xpert SA Assay (FDA approved for NASAL specimens in patients 36 years of age and older), is one component of a comprehensive surveillance program. It is not intended to diagnose infection nor to guide or  monitor treatment. Performed at [facility], [place] [address] [place], [place] [address]      Labs: BNP (last 3 results) Recent Labs    05/20/21 0211 05/21/21 0455 05/22/21 0340  BNP 117.7* 54.0 024.0*   Basic Metabolic Panel: Recent Labs  Lab 05/19/21 0136 05/20/21 0211 05/21/21 0455 05/21/21 1540 05/22/21 0340 05/23/21 0346  NA 141 138 139 137 137 140  K 3.8 4.0 3.8 4.4 4.9 4.3  CL 107 107  106 107 108 110  CO2 24 22 25  20* 21* 25  GLUCOSE 107* 101* 95 190* 111* 95  BUN 20 20 16 18 17 20   CREATININE 1.13 1.13 1.20 1.19 1.12 1.24  CALCIUM 9.1 8.9 8.7* 7.7* 8.1* 8.4*  MG 2.0 2.2 2.2  --  1.8 2.1   Liver Function Tests: Recent Labs  Lab 05/19/21 0136 05/20/21 0211 05/21/21 0455 05/21/21 1540 05/22/21 0340  AST 17 19 20  37 26  ALT 17 17 20 27 23   ALKPHOS 54 54 49 36* 29*  BILITOT 0.8 0.5 0.6 1.0 0.6  PROT 6.6 6.7 6.4* 5.4* 5.4*  ALBUMIN 3.9 4.0 3.8 3.6 3.5   No results for input(s): LIPASE, AMYLASE in the last 168 hours. No results for input(s): AMMONIA in the last 168 hours. CBC: Recent Labs  Lab 05/17/21 1711 05/18/21 0059 05/19/21 0136 05/20/21 0211 05/21/21 0455 05/21/21 1210 05/22/21 0340 05/23/21 0346  WBC 5.3   < > 4.3 4.9 4.8 8.2 10.9* 7.5  NEUTROABS 3.2  --  2.3 2.3 2.5  --  9.3*  --   HGB 16.7   < > 15.8 16.1 16.0 13.8 13.8 12.3*  HCT 49.4   < > 46.8 47.8 47.1 44.3 40.7 38.0*  MCV 103.6*   < > 99.6 99.8 99.6 108.6* 101.2* 103.8*  PLT 145*   < > 123* 125* 124* 145* 108* 88*   < > = values in this interval not displayed.   Cardiac Enzymes: No results for input(s): CKTOTAL, CKMB, CKMBINDEX, TROPONINI in the last 168 hours. BNP: Invalid input(s): POCBNP CBG: No results for input(s): GLUCAP in the last 168 hours. D-Dimer No results for input(s): DDIMER in the last 72 hours. Hgb A1c No results for input(s): HGBA1C in the last 72 hours. Lipid Profile Recent Labs    05/22/21 0340  CHOL 99  HDL 38*  LDLCALC 54  TRIG 37   CHOLHDL 2.6   Thyroid function studies Recent Labs    05/21/21 0455  TSH 10.423*   Anemia work up No results for input(s): VITAMINB12, FOLATE, FERRITIN, TIBC, IRON, RETICCTPCT in the last 72 hours. Urinalysis    Component Value Date/Time   COLORURINE YELLOW 05/17/2021 2113   APPEARANCEUR CLEAR 05/17/2021 2113   LABSPEC >1.046 (H) 05/17/2021 2113   PHURINE 6.0 05/17/2021 2113   GLUCOSEU NEGATIVE 05/17/2021 2113   HGBUR NEGATIVE 05/17/2021 2113   BILIRUBINUR NEGATIVE 05/17/2021 2113   KETONESUR NEGATIVE 05/17/2021 2113   PROTEINUR NEGATIVE 05/17/2021 2113   UROBILINOGEN 0.2 05/23/2013 1033   NITRITE NEGATIVE 05/17/2021 2113   LEUKOCYTESUR NEGATIVE 05/17/2021 2113   Sepsis Labs Invalid input(s): PROCALCITONIN,  WBC,  LACTICIDVEN Microbiology Recent Results (from the past 240 hour(s))  Resp Panel by RT-PCR (Flu A&B, Covid) Nasopharyngeal Swab     Status: None   Collection Time: 05/17/21  6:29 PM   Specimen: Nasopharyngeal Swab; Nasopharyngeal(NP) swabs in vial transport medium  Result Value Ref Range Status   SARS Coronavirus 2 by RT PCR NEGATIVE NEGATIVE Final    Comment: (NOTE) SARS-CoV-2 target nucleic acids are NOT DETECTED.  The SARS-CoV-2 RNA is generally detectable in upper respiratory specimens during the acute phase of infection. The lowest concentration of SARS-CoV-2 viral copies this assay can detect is 138 copies/mL. A negative result does not preclude SARS-Cov-2 infection and should not be used as the sole basis for treatment or other patient management decisions. A negative result may occur with  improper specimen collection/handling, submission of specimen other than nasopharyngeal  swab, presence of viral mutation(s) within the areas targeted by this assay, and inadequate number of viral copies(<138 copies/mL). A negative result must be combined with clinical observations, patient history, and epidemiological information. The expected result is  Negative.  Fact Sheet for Patients:  EntrepreneurPulse.com.au  Fact Sheet for Healthcare Providers:  IncredibleEmployment.be  This test is no t yet approved or cleared by the [place] FDA and  has been authorized for detection and/or diagnosis of SARS-CoV-2 by FDA under an Emergency Use Authorization (EUA). This EUA will remain  in effect (meaning this test can be used) for the duration of the COVID-19 declaration under Section 564(b)(1) of the Act, 21 U.S.C.section 360bbb-3(b)(1), unless the authorization is terminated  or revoked sooner.       Influenza A by PCR NEGATIVE NEGATIVE Final   Influenza B by PCR NEGATIVE NEGATIVE Final    Comment: (NOTE) The Xpert Xpress SARS-CoV-2/FLU/RSV plus assay is intended as an aid in the diagnosis of influenza from Nasopharyngeal swab specimens and should not be used as a sole basis for treatment. Nasal washings and aspirates are unacceptable for Xpert Xpress SARS-CoV-2/FLU/RSV testing.  Fact Sheet for Patients: EntrepreneurPulse.com.au  Fact Sheet for Healthcare Providers: IncredibleEmployment.be  This test is not yet approved or cleared by the [place] FDA and has been authorized for detection and/or diagnosis of SARS-CoV-2 by FDA under an Emergency Use Authorization (EUA). This EUA will remain in effect (meaning this test can be used) for the duration of the COVID-19 declaration under Section 564(b)(1) of the Act, 21 U.S.C. section 360bbb-3(b)(1), unless the authorization is terminated or revoked.  Performed at [facility] [address], [place], [place] [address]   Surgical pcr screen     Status: None   Collection Time: 05/20/21 10:12 PM   Specimen: Nasal Mucosa; Nasal Swab  Result Value Ref Range Status   MRSA, PCR NEGATIVE NEGATIVE Final   Staphylococcus aureus NEGATIVE NEGATIVE Final    Comment: (NOTE) The Xpert SA Assay (FDA approved for  NASAL specimens in patients 51 years of age and older), is one component of a comprehensive surveillance program. It is not intended to diagnose infection nor to guide or monitor treatment. Performed at [facility], [place] [address] [place], [place] [address]     Please note: You were cared for by a hospitalist during your hospital stay. Once you are discharged, your primary care physician will handle any further medical issues. Please note that NO REFILLS for any discharge medications will be authorized once you are discharged, as it is imperative that you return to your primary care physician (or establish a relationship with a primary care physician if you do not have one) for your post hospital discharge needs so that they can reassess your need for medications and monitor your lab values.    Time coordinating discharge: 40 minutes  SIGNED:   Shelly Coss, MD  [facility] 05/23/2021, 11:03 AM Pager [phone]  If 7PM-7AM, please contact night-coverage [email or web address] Password [email or web address]

## 2021-05-28 DIAGNOSIS — J309 Allergic rhinitis, unspecified: Secondary | ICD-10-CM | POA: Diagnosis not present

## 2021-05-28 DIAGNOSIS — I10 Essential (primary) hypertension: Secondary | ICD-10-CM | POA: Diagnosis not present

## 2021-05-30 DIAGNOSIS — Z9889 Other specified postprocedural states: Secondary | ICD-10-CM | POA: Diagnosis not present

## 2021-05-30 DIAGNOSIS — Z8673 Personal history of transient ischemic attack (TIA), and cerebral infarction without residual deficits: Secondary | ICD-10-CM | POA: Diagnosis not present

## 2021-06-05 DIAGNOSIS — L578 Other skin changes due to chronic exposure to nonionizing radiation: Secondary | ICD-10-CM | POA: Diagnosis not present

## 2021-06-05 DIAGNOSIS — L570 Actinic keratosis: Secondary | ICD-10-CM | POA: Diagnosis not present

## 2021-06-05 DIAGNOSIS — L821 Other seborrheic keratosis: Secondary | ICD-10-CM | POA: Diagnosis not present

## 2021-06-05 DIAGNOSIS — Z08 Encounter for follow-up examination after completed treatment for malignant neoplasm: Secondary | ICD-10-CM | POA: Diagnosis not present

## 2021-06-05 DIAGNOSIS — Z1283 Encounter for screening for malignant neoplasm of skin: Secondary | ICD-10-CM | POA: Diagnosis not present

## 2021-06-05 DIAGNOSIS — Z85828 Personal history of other malignant neoplasm of skin: Secondary | ICD-10-CM | POA: Diagnosis not present

## 2021-06-05 DIAGNOSIS — L218 Other seborrheic dermatitis: Secondary | ICD-10-CM | POA: Diagnosis not present

## 2021-06-11 NOTE — Progress Notes (Signed)
PATIENT: Wayne Ortiz DOB: 03-04-1946  REASON FOR VISIT: follow up HISTORY FROM: patient PRIMARY NEUROLOGIST: Dr. Leonie Man  HISTORY OF PRESENT ILLNESS: Today 06/11/21 Wayne Ortiz is a 76 year old male who presented to the hospital on 05/17/21 patient had 3 episodes of amaurosis Fugax.  He reports that during these episodes that she had a worker and came across his field of vision.  He was sent to the ED by his ophthalmologist.  It was found that patient had 70% stenosis of the left carotid artery most likely causing his symptoms.  CEA performed 1/24.  CT head showed no acute abnormality however CTA of the head and neck showed 70% stenosis of distal left common carotid artery.  MRI of the brain showed mild to moderate signal changes and cerebral right matter due to small vessel disease.  2D echo with ejection fraction of 78% grade 1 diastolic dysfunction no atrial level shunt.  LDL 125-discharged on Zetia 10 mg daily due to statin intolerance.  Patient was already on Eliquis due to atrial fibrillation  Today, patient is here with his daughter-in-law.  Fortunately he was not left with any deficits after his stroke.  He saw his PCP several weeks ago and reports that blood work was taken.  Unable to see results in epic.  Patient reports that Zetia caused myalgias.  He has stopped this.  He did not make his PCP nor cardiology aware.  He returns today for an evaluation.  REVIEW OF SYSTEMS: Out of a complete 14 system review of symptoms, the patient complains only of the following symptoms, and all other reviewed systems are negative.  ALLERGIES: Allergies  Allergen Reactions   Amiodarone Shortness Of Breath   Atorvastatin Other (See Comments)    Arthralgias   Dilaudid [Hydromorphone] Other (See Comments)    Shaking all over (chills/Malaise)   Hydrocodone Hives   Lisinopril Other (See Comments)    CP and SOB   Metoprolol Other (See Comments)    CP and SOb   Rosuvastatin Other (See Comments)     Myalgias   Diltiazem Rash   Rocephin [Ceftriaxone Sodium In Dextrose] Rash    HOME MEDICATIONS: Outpatient Medications Prior to Visit  Medication Sig Dispense Refill   acetaminophen (TYLENOL) 500 MG tablet Take 1,000 mg by mouth every 6 (six) hours as needed for mild pain or fever.     allopurinol (ZYLOPRIM) 300 MG tablet Take 300 mg by mouth every morning.      aspirin EC 81 MG EC tablet Take 1 tablet (81 mg total) by mouth daily. Swallow whole. 30 tablet 0   docusate sodium (COLACE) 100 MG capsule Take 100-200 mg by mouth 2 (two) times daily as needed for constipation.     ELIQUIS 5 MG TABS tablet TAKE 1 TABLET BY MOUTH TWICE A DAY (Patient taking differently: Take 5 mg by mouth 2 (two) times daily.) 60 tablet 6   ezetimibe (ZETIA) 10 MG tablet Take 1 tablet (10 mg total) by mouth daily. 30 tablet 1   furosemide (LASIX) 40 MG tablet TAKE ONE TABLET BY MOUTH DAILY,MAY TAKE EXTRA TAB DAILY AS NEEDED FOR WEIGHT GAIN/SWELLING (Patient taking differently: Take 40 mg by mouth daily.) 120 tablet 2   gabapentin (NEURONTIN) 300 MG capsule Take 900 mg by mouth at bedtime.     HYDROcodone-acetaminophen (NORCO) 10-325 MG tablet Take 1 tablet by mouth See admin instructions. Take 1 tablet by mouth every 6 to 8 hours as needed for pain.  metoprolol tartrate (LOPRESSOR) 25 MG tablet Take 12.5 mg by mouth 2 (two) times daily.     niacin 100 MG tablet Take 1 tablet (100 mg total) by mouth 2 (two) times daily with a meal. 60 tablet 0   polyethylene glycol (MIRALAX / GLYCOLAX) 17 g packet Take 17 g by mouth daily as needed for mild constipation. 14 each 0   SYNTHROID 112 MCG tablet Take 112 mcg by mouth daily at 2 am.     No facility-administered medications prior to visit.    PAST MEDICAL HISTORY: Past Medical History:  Diagnosis Date   Arthritis    Atrial fibrillation (HCC)    BPH (benign prostatic hypertrophy)    CAD (coronary artery disease)    a. s/p DES 05/2014 at [facility])     Complication of anesthesia    BECAME COMBATIVE   Dyspnea    Dysrhythmia    Femur fracture (HCC)    GERD (gastroesophageal reflux disease)    occasional TUMS   Gout    History of gout    History of kidney stones    Hypertension    Hypothyroidism    Persistent atrial fibrillation (HCC)    Precancerous skin lesion    Renal disorder     PAST SURGICAL HISTORY: Past Surgical History:  Procedure Laterality Date   arm fracture surgery Left    CARDIOVERSION [name] 12/09/2014   Procedure: CARDIOVERSION;  Surgeon: [name], MD;  Location: [place];  Service: Cardiovascular;  Laterality: [name];   CARDIOVERSION [name] 05/05/2016   Procedure: CARDIOVERSION;  Surgeon: [name], MD;  Location: [place];  Service: Cardiovascular;  Laterality: [name];   CARDIOVERSION [name] 08/22/2016   Procedure: CARDIOVERSION;  Surgeon: Lelon Perla, MD;  Location: [facility] ENDOSCOPY;  Service: Cardiovascular;  Laterality: [name];   CATARACTS     EXCISION   CERVICAL SPINE SURGERY     CHOLECYSTECTOMY [name] 01/03/2013   Procedure: LAPAROSCOPIC CHOLECYSTECTOMY;  Surgeon: [name], MD;  Location: [name];  Service: General;  Laterality: [name];   ELECTROPHYSIOLOGIC STUDY [name] 03/15/2015   Procedure: Atrial Fibrillation Ablation;  Surgeon: [name], MD;  Location: [place] CV LAB;  Service: Cardiovascular;  Laterality: [name];   ELECTROPHYSIOLOGIC STUDY [name] 04/01/2016   Procedure: Atrial Fibrillation Ablation;  Surgeon: [name], MD;  Location: [place] CV LAB;  Service: Cardiovascular;  Laterality: [name];   ENDARTERECTOMY Left 05/21/2021   Procedure: LEFT CAROTID ENDARTERECTOMY AND RESECTION OF LEFT COMMON REDUNDANT CAROTID ARTERY;  Surgeon: Angelia Mould, MD;  Location: [place];  Service: Vascular;  Laterality: Left;   ERCP [name] 01/02/2013   Procedure: ENDOSCOPIC RETROGRADE CHOLANGIOPANCREATOGRAPHY (ERCP);  Surgeon: [name], MD;  Location: [name];  Service: Endoscopy;  Laterality: [name];   FEMUR FRACTURE  SURGERY     LIVER BIOPSY [name] 01/03/2013   Procedure: LIVER BIOPSY;  Surgeon: [name], MD;  Location: [name];  Service: General;  Laterality: [name];   PATCH ANGIOPLASTY Left 05/21/2021   Procedure: PATCH ANGIOPLASTY USING Rueben Bash BIOLOGIC PATCH;  Surgeon: Angelia Mould, MD;  Location: [facility] OR;  Service: Vascular;  Laterality: Left;   RIGHT/LEFT HEART CATH AND CORONARY ANGIOGRAPHY [name] 07/04/2016   Procedure: Right/Left Heart Cath and Coronary Angiography;  Surgeon: Belva Crome, MD;  Location: [place] CV LAB;  Service: Cardiovascular;  Laterality: [name];   SPHINCTEROTOMY [name] 01/02/2013   Procedure: SPHINCTEROTOMY;  Surgeon: [name], MD;  Location: [name];  Service: Endoscopy;  Laterality: [name];  Stone Extraction   TEE WITHOUT CARDIOVERSION [name] 08/22/2016   Procedure: TRANSESOPHAGEAL ECHOCARDIOGRAM (TEE);  Surgeon: Lelon Perla, MD;  Location: [place];  Service: Cardiovascular;  Laterality: [name];   TOTAL KNEE ARTHROPLASTY Right 05/30/2013   Procedure: RIGHT TOTAL KNEE ARTHROPLASTY;  Surgeon: [name], MD;  Location: [name];  Service: Orthopedics;  Laterality: Right;   TOTAL KNEE ARTHROPLASTY Left 03/05/2021   Procedure: TOTAL KNEE ARTHROPLASTY;  Surgeon: Paralee Cancel, MD;  Location: [name];  Service: Orthopedics;  Laterality: Left;    FAMILY HISTORY: Family History  Problem Relation Age of Onset   Kidney Stones Mother     SOCIAL HISTORY: Social History   Socioeconomic History   Marital status: Widowed    Spouse name: Not on file   Number of children: Not on file   Years of education: Not on file   Highest education level: Not on file  Occupational History   Not on file  Tobacco Use   Smoking status: Former    Packs/day: 1.00    Years: 20.00    Pack years: 20.00    Types: Cigarettes    Quit date: 05/23/1973    Years since quitting: 48.0   Smokeless tobacco: Never  Vaping Use   Vaping Use: Never used  Substance and Sexual Activity   Alcohol use: Not  Currently   Drug use: No   Sexual activity: Not Currently  Other Topics Concern   Not on file  Social History Narrative   Not on file   Social Determinants of Health   Financial Resource Strain: Not on file  Food Insecurity: Not on file  Transportation Needs: Not on file  Physical Activity: Not on file  Stress: Not on file  Social Connections: Not on file  Intimate Partner Violence: Not on file      PHYSICAL EXAM  Vitals:   06/12/21 0910  BP: (!) 147/92  Pulse: 85  Weight: 252 lb 12.8 oz (114.7 kg)  Height: 5\' 11"  (1.803 m)   Body mass index is 35.26 kg/m.  Generalized: Well developed, in no acute distress   Neurological examination  Mentation: Alert oriented to time, place, history taking. Follows all commands speech and language fluent Cranial nerve II-XII: Pupils were equal round reactive to light. Extraocular movements were full, visual field were full on confrontational test. Facial sensation and strength were normal. Uvula tongue midline. Head turning and shoulder shrug  were normal and symmetric. Motor: The motor testing reveals 5 over 5 strength of all 4 extremities. Good symmetric motor tone is noted throughout.  Sensory: Sensory testing is intact to soft touch on all 4 extremities. No evidence of extinction is noted.  Coordination: Cerebellar testing reveals good finger-nose-finger and heel-to-shin bilaterally.  Gait and station: Gait is normal.  Slight limp due to knee replacement   DIAGNOSTIC DATA (LABS, IMAGING, TESTING) - I reviewed patient records, labs, notes, testing and imaging myself where available.  Lab Results  Component Value Date   WBC 7.5 05/23/2021   HGB 12.3 (L) 05/23/2021   HCT 38.0 (L) 05/23/2021   MCV 103.8 (H) 05/23/2021   PLT 88 (L) 05/23/2021      Component Value Date/Time   NA 140 05/23/2021 0346   NA 145 (H) 01/08/2017 1319   K 4.3 05/23/2021 0346   CL 110 05/23/2021 0346   CO2 25 05/23/2021 0346   GLUCOSE 95 05/23/2021  0346   BUN 20 05/23/2021 0346   BUN 20 01/08/2017  1319   CREATININE 1.24 05/23/2021 0346   CREATININE 1.08 03/08/2015 0950   CALCIUM 8.4 (L) 05/23/2021 0346   PROT 5.4 (L) 05/22/2021 0340   PROT 6.8 01/08/2017 1319   ALBUMIN 3.5 05/22/2021 0340   ALBUMIN 4.5 01/08/2017 1319   AST 26 05/22/2021 0340   ALT 23 05/22/2021 0340   ALKPHOS 29 (L) 05/22/2021 0340   BILITOT 0.6 05/22/2021 0340   BILITOT 0.4 01/08/2017 1319   GFRNONAA >60 05/23/2021 0346   GFRAA >60 03/11/2019 0200   Lab Results  Component Value Date   CHOL 99 05/22/2021   HDL 38 (L) 05/22/2021   LDLCALC 54 05/22/2021   TRIG 37 05/22/2021   CHOLHDL 2.6 05/22/2021   Lab Results  Component Value Date   HGBA1C 5.7 (H) 05/18/2021   No results found for: VITAMINB12 Lab Results  Component Value Date   TSH 10.423 (H) 05/21/2021      ASSESSMENT AND PLAN 76 y.o. year old male  has a past medical history of Arthritis, Atrial fibrillation ([place]), BPH (benign prostatic hypertrophy), CAD (coronary artery disease), Cancer ([place]), Complication of anesthesia, Dyspnea, Dysrhythmia, Femur fracture ([place]), GERD (gastroesophageal reflux disease), Gout, History of gout, History of kidney stones, Hypertension, Hypothyroidism, Persistent atrial fibrillation ([place]), Precancerous skin lesion, and Renal disorder. here with:  Left amaurosis fugax due to left ICA high-grade stenosis  Patient remains on Eliquis d/t PAF LDL 125 goal less than 70.  Cannot tolerate statins most recently was on Zetia could not tolerate due to myalgias.  Encourage patient to make his PCP or cardiology aware.  Repatha may be an option for him? Blood pressure 147/92 goal less than 130/90 currently on metoprolol managed by PCP advised that if systolic number remains elevated discussed with primary care.  Patient voiced understanding Discussed maintaining balanced diet and exercise-was given a print out today discussing diet Advised if he has any strokelike symptoms to  call 911 or go to the ED immediately Continue regular follow-ups with PCP and cardiology   Patient advised that if symptoms worsen or he develops new symptoms he should let us know otherwise he will follow-up on an as-needed basis  Ward Givens, MSN, NP-C 06/11/2021, 3:23 PM [facility] [address], [place], [place] [address] [phone]

## 2021-06-12 ENCOUNTER — Ambulatory Visit: Payer: Medicare HMO | Admitting: Adult Health

## 2021-06-12 ENCOUNTER — Other Ambulatory Visit: Payer: Self-pay

## 2021-06-12 ENCOUNTER — Encounter: Payer: Self-pay | Admitting: Adult Health

## 2021-06-12 VITALS — BP 147/92 | HR 85 | Ht 71.0 in | Wt 252.8 lb

## 2021-06-12 DIAGNOSIS — E785 Hyperlipidemia, unspecified: Secondary | ICD-10-CM | POA: Diagnosis not present

## 2021-06-12 DIAGNOSIS — G459 Transient cerebral ischemic attack, unspecified: Secondary | ICD-10-CM

## 2021-06-12 DIAGNOSIS — Z9889 Other specified postprocedural states: Secondary | ICD-10-CM | POA: Diagnosis not present

## 2021-06-12 NOTE — Progress Notes (Signed)
I agree with the above plan 

## 2021-06-12 NOTE — Patient Instructions (Signed)
Continue  Eliquis  LDL 125 goal less than 70 currently- couldn't tolerate statins- consider repatha? Talk to PCP or cardiology Blood pressure goal less than 130/90. If top number remains elevated then discuss with PCP Discussed maintaining balanced diet and exercise Advised if he has any strokelike symptoms to call 911 or go to the ED immediately Continue regular follow-ups with PCP and cardiology

## 2021-06-28 DIAGNOSIS — I480 Paroxysmal atrial fibrillation: Secondary | ICD-10-CM | POA: Diagnosis not present

## 2021-07-01 DIAGNOSIS — R69 Illness, unspecified: Secondary | ICD-10-CM | POA: Diagnosis not present

## 2021-07-01 DIAGNOSIS — M5136 Other intervertebral disc degeneration, lumbar region: Secondary | ICD-10-CM | POA: Diagnosis not present

## 2021-07-01 DIAGNOSIS — M25562 Pain in left knee: Secondary | ICD-10-CM | POA: Diagnosis not present

## 2021-07-17 DIAGNOSIS — I480 Paroxysmal atrial fibrillation: Secondary | ICD-10-CM | POA: Diagnosis not present

## 2021-07-17 DIAGNOSIS — G603 Idiopathic progressive neuropathy: Secondary | ICD-10-CM | POA: Diagnosis not present

## 2021-07-17 DIAGNOSIS — G453 Amaurosis fugax: Secondary | ICD-10-CM | POA: Diagnosis not present

## 2021-07-17 DIAGNOSIS — E038 Other specified hypothyroidism: Secondary | ICD-10-CM | POA: Diagnosis not present

## 2021-07-18 ENCOUNTER — Encounter: Payer: Self-pay | Admitting: Internal Medicine

## 2021-08-01 DIAGNOSIS — H0288A Meibomian gland dysfunction right eye, upper and lower eyelids: Secondary | ICD-10-CM | POA: Diagnosis not present

## 2021-08-01 DIAGNOSIS — H0288B Meibomian gland dysfunction left eye, upper and lower eyelids: Secondary | ICD-10-CM | POA: Diagnosis not present

## 2021-08-29 ENCOUNTER — Ambulatory Visit: Payer: Medicare HMO | Admitting: Gastroenterology

## 2021-09-09 DIAGNOSIS — M5136 Other intervertebral disc degeneration, lumbar region: Secondary | ICD-10-CM | POA: Diagnosis not present

## 2021-09-09 DIAGNOSIS — R69 Illness, unspecified: Secondary | ICD-10-CM | POA: Diagnosis not present

## 2021-09-09 DIAGNOSIS — M25562 Pain in left knee: Secondary | ICD-10-CM | POA: Diagnosis not present

## 2021-09-20 DIAGNOSIS — I739 Peripheral vascular disease, unspecified: Secondary | ICD-10-CM | POA: Diagnosis not present

## 2021-09-20 DIAGNOSIS — I2510 Atherosclerotic heart disease of native coronary artery without angina pectoris: Secondary | ICD-10-CM | POA: Diagnosis not present

## 2021-09-20 DIAGNOSIS — I509 Heart failure, unspecified: Secondary | ICD-10-CM | POA: Diagnosis not present

## 2021-09-20 DIAGNOSIS — M545 Low back pain, unspecified: Secondary | ICD-10-CM | POA: Diagnosis not present

## 2021-09-20 DIAGNOSIS — E039 Hypothyroidism, unspecified: Secondary | ICD-10-CM | POA: Diagnosis not present

## 2021-09-20 DIAGNOSIS — M1990 Unspecified osteoarthritis, unspecified site: Secondary | ICD-10-CM | POA: Diagnosis not present

## 2021-09-20 DIAGNOSIS — I4891 Unspecified atrial fibrillation: Secondary | ICD-10-CM | POA: Diagnosis not present

## 2021-09-20 DIAGNOSIS — M109 Gout, unspecified: Secondary | ICD-10-CM | POA: Diagnosis not present

## 2021-09-20 DIAGNOSIS — D6869 Other thrombophilia: Secondary | ICD-10-CM | POA: Diagnosis not present

## 2021-09-20 DIAGNOSIS — Z008 Encounter for other general examination: Secondary | ICD-10-CM | POA: Diagnosis not present

## 2021-09-20 DIAGNOSIS — G629 Polyneuropathy, unspecified: Secondary | ICD-10-CM | POA: Diagnosis not present

## 2021-09-20 DIAGNOSIS — I110 Hypertensive heart disease with heart failure: Secondary | ICD-10-CM | POA: Diagnosis not present

## 2021-09-24 ENCOUNTER — Encounter: Payer: Self-pay | Admitting: Internal Medicine

## 2021-09-24 ENCOUNTER — Ambulatory Visit (HOSPITAL_COMMUNITY)
Admission: RE | Admit: 2021-09-24 | Discharge: 2021-09-24 | Disposition: A | Discharge status: Home / Self Care | Payer: Medicare HMO | Source: Ambulatory Visit | Attending: Internal Medicine | Admitting: Internal Medicine

## 2021-09-24 ENCOUNTER — Ambulatory Visit (INDEPENDENT_AMBULATORY_CARE_PROVIDER_SITE_OTHER): Payer: Medicare HMO | Admitting: Internal Medicine

## 2021-09-24 DIAGNOSIS — R0609 Other forms of dyspnea: Secondary | ICD-10-CM | POA: Diagnosis not present

## 2021-09-24 DIAGNOSIS — R0600 Dyspnea, unspecified: Secondary | ICD-10-CM | POA: Diagnosis not present

## 2021-09-24 MED ORDER — PANTOPRAZOLE SODIUM 40 MG PO TBEC: 40.0000 mg | DELAYED_RELEASE_TABLET | Freq: Every day | ORAL | 2 refills | Status: DC

## 2021-09-24 MED ORDER — FAMOTIDINE 20 MG PO TABS: ORAL_TABLET | ORAL | 11 refills | Status: AC

## 2021-09-24 NOTE — Patient Instructions (Addendum)
Pantoprazole (protonix) 40 mg   Take  30-60 min before first meal of the day and Pepcid (famotidine)  20 mg after supper until return to office - this is the best way to tell whether stomach acid is contributing to your problem.    GERD (REFLUX)  is an extremely common cause of respiratory symptoms just like yours , many times with no obvious heartburn at all.    It can be treated with medication, but also with lifestyle changes including elevation of the head of your bed (ideally with 6 -8inch blocks under the headboard of your bed),  Smoking cessation, avoidance of late meals, excessive alcohol, and avoid fatty foods, chocolate, peppermint, colas, red wine, and acidic juices such as orange juice.  NO MINT OR MENTHOL PRODUCTS SO NO COUGH DROPS  USE SUGARLESS CANDY INSTEAD (Jolley ranchers or Stover's or Life Savers) or even ice chips will also do - the key is to swallow to prevent all throat clearing. NO OIL BASED VITAMINS - use powdered substitutes.  Avoid fish oil when coughing.    To get the most out of exercise, you need to be continuously aware that you are slightly short of breath, but never out of breath, building up to 30 minutes daily. As you improve, it will actually be easier for you to do the same amount of exercise  in  30 minutes so always push to the level where you are short of breath.     Please remember to go to the  x-ray department  @  [facility] for your tests - we will call you with the results when they are available     Please schedule a follow up office visit in 6 weeks, call sooner if needed

## 2021-09-24 NOTE — Progress Notes (Signed)
Wayne Ortiz, male    DOB: 01/09/1946,   MRN: [id number]   Brief patient profile:  63  yowm quit smoking 1975  referred to pulmonary clinic in [place]  09/24/2021 by [place] Crane  for doe  worse p covid in 02/2019  with no evidence of airflow obst on pfts from 06/2016    History of Present Illness  09/24/2021  Pulmonary/ 1st office eval/ [facility] / [place] [facility]  Chief Complaint  Patient presents with   Consult    SOB has been worse since having covid.   Dyspnea:  food lion goes slow whole but sometimes can't do  Cough: full of wheeze and congestion in am  Sleep: sleeps on wedge  SABA use: none   No obvious day to day or daytime variability or assoc excess/ purulent sputum or mucus plugs or hemoptysis or cp or chest tightness, subjective wheeze or overt sinus or hb symptoms.   Sleeping ok  without nocturnal  or early am exacerbation  of respiratory  c/o's or need for noct saba. Also denies any obvious fluctuation of symptoms with weather or environmental changes or other aggravating or alleviating factors except as outlined above   No unusual exposure hx or h/o childhood pna/ asthma or knowledge of premature birth.  Current Allergies, Complete Past Medical History, Past Surgical History, Family History, and Social History were reviewed in [employer] record.  ROS  The following are not active complaints unless bolded Hoarseness, sore throat, dysphagia/globus, dental problems, itching, sneezing,  nasal congestion or discharge of excess mucus or purulent secretions, ear ache,   fever, chills, sweats, unintended wt loss or wt gain, classically pleuritic or exertional cp,  orthopnea pnd or arm/hand swelling  or leg swelling, presyncope, palpitations, abdominal pain, anorexia, nausea, vomiting, diarrhea  or change in bowel habits or change in bladder habits, change in stools or change in urine, dysuria, hematuria,  rash, arthralgias, visual complaints, headache, numbness,  weakness or ataxia or problems with walking or coordination,  change in mood or  memory.           Past Medical History:  Diagnosis Date   Arthritis    Atrial fibrillation (HCC)    BPH (benign prostatic hypertrophy)    CAD (coronary artery disease)    a. s/p DES 05/2014 at [facility])    Complication of anesthesia    BECAME COMBATIVE   Dyspnea    Dysrhythmia    Femur fracture (HCC)    GERD (gastroesophageal reflux disease)    occasional TUMS   Gout    History of gout    History of kidney stones    Hypertension    Hypothyroidism    Persistent atrial fibrillation (HCC)    Precancerous skin lesion    Renal disorder     Outpatient Medications Prior to Visit  Medication Sig Dispense Refill   allopurinol (ZYLOPRIM) 300 MG tablet Take 300 mg by mouth every morning.      docusate sodium (COLACE) 100 MG capsule Take 100-200 mg by mouth 2 (two) times daily as needed for constipation.     ELIQUIS 5 MG TABS tablet TAKE 1 TABLET BY MOUTH TWICE A DAY (Patient taking differently: Take 5 mg by mouth 2 (two) times daily.) 60 tablet 6   furosemide (LASIX) 40 MG tablet TAKE ONE TABLET BY MOUTH DAILY,MAY TAKE EXTRA TAB DAILY AS NEEDED FOR WEIGHT GAIN/SWELLING (Patient taking differently: Take 40 mg by mouth daily.) 120 tablet 2  gabapentin (NEURONTIN) 300 MG capsule Take 900 mg by mouth at bedtime.     HYDROcodone-acetaminophen (NORCO) 10-325 MG tablet Take 1 tablet by mouth See admin instructions. Take 1 tablet by mouth every 6 to 8 hours as needed for pain.     metoprolol tartrate (LOPRESSOR) 25 MG tablet Take 12.5 mg by mouth 2 (two) times daily.     SYNTHROID 112 MCG tablet Take 112 mcg by mouth daily at 2 am.     acetaminophen (TYLENOL) 500 MG tablet Take 1,000 mg by mouth every 6 (six) hours as needed for mild pain or fever.     aspirin EC 81 MG EC tablet Take 1 tablet (81 mg total) by mouth daily. Swallow whole. 30 tablet 0   ezetimibe (ZETIA) 10 MG tablet Take 1 tablet (10 mg  total) by mouth daily. (Patient not taking: Reported on 06/12/2021) 30 tablet 1   niacin 100 MG tablet Take 1 tablet (100 mg total) by mouth 2 (two) times daily with a meal. (Patient not taking: Reported on 06/12/2021) 60 tablet 0   No facility-administered medications prior to visit.     Objective:     BP 140/86 (BP Location: Left Arm, Patient Position: Sitting)   Pulse 86   Temp 98 F (36.7 C) (Temporal)   Ht '5\' 11"'$  (1.803 m)   Wt 266 lb 9.6 oz (120.9 kg)   SpO2 96% Comment: ra  BMI 37.18 kg/m   SpO2: 96 % (ra)     Vital signs reviewed  09/24/2021  - Note at rest 02 sats  96% on RA   General appearance:    obese hoarse amb wm nad     Amb hoarse wm classic pseudowheeze    HEENT : Oropharynx  upper dentures  Nasal turbintes nl    NECK :  without  appent JVD/ palpable Nodes/TM    LUNGS: no acc muscle use,  Nl contour chest which is clear to A and P bilaterally without cough on insp or exp maneuvers   CV:  slt irreg S1S2  no s3 or murmur or increase in P2, and no edema   ABD:  obese soft and nontender with nl inspiratory excursion in the supine position. No bruits or organomegaly appreciated   MS:  Nl gait/ ext warm without deformities Or obvious joint restrictions  calf tenderness, cyanosis or clubbing     SKIN: warm and dry without lesions    NEURO:  alert, approp, nl sensorium with  no motor or cerebellar deficits apparent.     CXR PA and Lateral:   09/24/2021 :    I personally reviewed images and impression is as follows:     Mild cm/small lung volumes/ slt elevatoin R HD no acute findings      Assessment   DOE (dyspnea on exertion) Onset around 2015  - PFT's  07/10/16   FEV1 2.69 (77 % ) ratio 0.78  p5 % improvement from saba p 0 prior to study with DLCO  23.17 (66%) corrects to 86%  for alv volume and FV curve truncated insp loop and ERV 7% at wt 263  - 09/24/2021   Walked on RA  x  3  lap(s) =  approx 450   ft  @ mod fast pace, stopped due to end of study  cc sob on 2nd and 3rd laps  with lowest 02 sats 95%    Findings are typical for obesity complicated by reduction in lung volumes and assoc with  GERD also causing upper airway masquerading as COPD   NB  When respiratory symptoms begin or become refractory well after a patient reports complete smoking cessation,  Especially when this wasn't the case while they were smoking, a red flag is raised based on the work of Dr Kris Mouton which states:  if you quit smoking when your best day FEV1 is still well preserved it is highly unlikely you will progress to severe disease.  That is to say, once the smoking stops,  the symptoms should not suddenly erupt or markedly worsen.  If so, the differential diagnosis should include  obesity/deconditioning,  LPR/Reflux/Aspiration syndromes,  occult CHF, or  especially side effect of medications commonly used in this population.  His Afib puts him also at risk of flares of  diastolic dysfunction in pt with known LVH   > f/u at [facility] and [place] cards planned.   >>> f/u here in 6 weeks on max gerd rx in [place] and regular sub max exertion building ideally up to 30 min daily     Morbid obesity due to excess calories ([place]) Complicated by HBP/ hyperlipidemia and very low ERV on PFTs  06/2016    Body mass index is 37.18 kg/m.  -  trending up  Lab Results  Component Value Date   TSH 10.423 (H) 05/21/2021      Contributing to doe and risk of GERD >>>   reviewed the need and the process to achieve and maintain neg calorie balance > defer f/u primary care including intermittently monitoring thyroid status     Each maintenance medication was reviewed in detail including emphasizing most importantly the difference between maintenance and prns and under what circumstances the prns are to be triggered using an action plan format where appropriate.  Total time for H and P, chart review, counseling,  directly observing portions of ambulatory 02 saturation study/ and  generating customized AVS unique to this office visit / same day charting  > 60 min new pt eval               Christinia Gully, MD 09/24/2021

## 2021-09-24 NOTE — Assessment & Plan Note (Signed)
Complicated by HBP/ hyperlipidemia and very low ERV on PFTs  06/2016    Body mass index is 37.18 kg/m.  -  trending up  Lab Results  Component Value Date   TSH 10.423 (H) 05/21/2021      Contributing to doe and risk of GERD >>>   reviewed the need and the process to achieve and maintain neg calorie balance > defer f/u primary care including intermittently monitoring thyroid status     Each maintenance medication was reviewed in detail including emphasizing most importantly the difference between maintenance and prns and under what circumstances the prns are to be triggered using an action plan format where appropriate.  Total time for H and P, chart review, counseling,  directly observing portions of ambulatory 02 saturation study/ and generating customized AVS unique to this office visit / same day charting  > 60 min new pt eval

## 2021-09-24 NOTE — Assessment & Plan Note (Addendum)
Onset around 2015  - PFT's  07/10/16   FEV1 2.69 (77 % ) ratio 0.78  p5 % improvement from saba p 0 prior to study with DLCO  23.17 (66%) corrects to 86%  for alv volume and FV curve truncated insp loop and ERV 7% at wt 263  - 09/24/2021   Walked on RA  x  3  lap(s) =  approx 450   ft  @ mod fast pace, stopped due to end of study cc sob on 2nd and 3rd laps  with lowest 02 sats 95%    Findings are typical for obesity complicated by reduction in lung volumes and assoc with GERD also causing upper airway masquerading as COPD   NB  When respiratory symptoms begin or become refractory well after a patient reports complete smoking cessation,  Especially when this wasn't the case while they were smoking, a red flag is raised based on the work of Dr Kris Mouton which states:  if you quit smoking when your best day FEV1 is still well preserved it is highly unlikely you will progress to severe disease.  That is to say, once the smoking stops,  the symptoms should not suddenly erupt or markedly worsen.  If so, the differential diagnosis should include  obesity/deconditioning,  LPR/Reflux/Aspiration syndromes,  occult CHF, or  especially side effect of medications commonly used in this population.  His Afib puts him also at risk of flares of  diastolic dysfunction in pt with known LVH   > f/u at [facility] and [place] cards planned.   >>> f/u here in 6 weeks on max gerd rx in [place] and regular sub max exertion building ideally up to 30 min daily

## 2021-09-26 DIAGNOSIS — K130 Diseases of lips: Secondary | ICD-10-CM | POA: Diagnosis not present

## 2021-09-29 ENCOUNTER — Other Ambulatory Visit: Payer: Self-pay | Admitting: Internal Medicine

## 2021-10-08 DIAGNOSIS — C4402 Squamous cell carcinoma of skin of lip: Secondary | ICD-10-CM | POA: Diagnosis not present

## 2021-10-08 DIAGNOSIS — D3701 Neoplasm of uncertain behavior of lip: Secondary | ICD-10-CM | POA: Diagnosis not present

## 2021-11-04 DIAGNOSIS — C4402 Squamous cell carcinoma of skin of lip: Secondary | ICD-10-CM | POA: Diagnosis not present

## 2021-11-07 DIAGNOSIS — I480 Paroxysmal atrial fibrillation: Secondary | ICD-10-CM | POA: Diagnosis not present

## 2021-11-07 DIAGNOSIS — I471 Supraventricular tachycardia: Secondary | ICD-10-CM | POA: Diagnosis not present

## 2021-11-07 DIAGNOSIS — I5030 Unspecified diastolic (congestive) heart failure: Secondary | ICD-10-CM | POA: Diagnosis not present

## 2021-11-07 DIAGNOSIS — I4729 Other ventricular tachycardia: Secondary | ICD-10-CM | POA: Diagnosis not present

## 2021-11-07 DIAGNOSIS — I2510 Atherosclerotic heart disease of native coronary artery without angina pectoris: Secondary | ICD-10-CM | POA: Diagnosis not present

## 2021-11-11 ENCOUNTER — Ambulatory Visit: Payer: Medicare HMO | Admitting: Internal Medicine

## 2021-12-02 DIAGNOSIS — F1120 Opioid dependence, uncomplicated: Secondary | ICD-10-CM | POA: Diagnosis not present

## 2021-12-02 DIAGNOSIS — M5136 Other intervertebral disc degeneration, lumbar region: Secondary | ICD-10-CM | POA: Diagnosis not present

## 2021-12-02 DIAGNOSIS — R69 Illness, unspecified: Secondary | ICD-10-CM | POA: Diagnosis not present

## 2021-12-10 ENCOUNTER — Ambulatory Visit: Payer: Medicare HMO | Admitting: Internal Medicine

## 2021-12-10 ENCOUNTER — Encounter: Payer: Self-pay | Admitting: Internal Medicine

## 2021-12-10 DIAGNOSIS — R0609 Other forms of dyspnea: Secondary | ICD-10-CM

## 2021-12-10 NOTE — Progress Notes (Unsigned)
Wayne Ortiz, male    DOB: February 14, 1946    MRN: [id number]   Brief patient profile:  11 yowm quit smoking 1975  referred to pulmonary clinic in [place]  09/24/2021 by [place] Crane  for doe  worse p covid in 02/2019  with no evidence of airflow obst on pfts from 06/2016    History of Present Illness  09/24/2021  Pulmonary/ 1st office eval/ Wayne Ortiz / [place] Office  Chief Complaint  Patient presents with   Consult    SOB has been worse since having covid.   Dyspnea:  [employer] goes slow whole but sometimes can't do  Cough: full of wheeze and congestion in am  Sleep: sleeps on wedge  SABA use: none  Rec Pantoprazole (protonix) 40 mg   Take  30-60 min before first meal of the day and Pepcid (famotidine)  20 mg after supper until return to office  GERD diet/bed blocks To get the most out of exercise, you need to be continuously aware that you are slightly short of breath, but never out of breath, building up to 30 minutes daily.      12/10/2021  f/u ov/[place] office/Wayne Ortiz re: doe x 2015  maint on gerd rx  / afib  Chief Complaint  Patient presents with   Follow-up    Patient says SOB is about the same since last ov. Has periods where SOB is worse   Dyspnea:  walking son's house had stop to catch breath  was a hill  Cough: none  Sleeping: on wedge does fine  SABA use: none / watching TV felt couldn't get breath so went and laid down went away " I think it was afib" 02: none  Covid status: vax  3      No obvious day to day or daytime variability or assoc excess/ purulent sputum or mucus plugs or hemoptysis or cp or chest tightness, subjective wheeze or overt sinus or hb symptoms.   Sleeping  without nocturnal  or early am exacerbation  of respiratory  c/o's or need for noct saba. Also denies any obvious fluctuation of symptoms with weather or environmental changes or other aggravating or alleviating factors except as outlined above   No unusual exposure hx or h/o childhood pna/ asthma or  knowledge of premature birth.  Current Allergies, Complete Past Medical History, Past Surgical History, Family History, and Social History were reviewed in [employer] record.  ROS  The following are not active complaints unless bolded Hoarseness, sore throat, dysphagia, dental problems, itching, sneezing,  nasal congestion or discharge of excess mucus or purulent secretions, ear ache,   fever, chills, sweats, unintended wt loss or wt gain, classically pleuritic or exertional cp,  orthopnea pnd or arm/hand swelling  or leg swelling, presyncope, palpitations, abdominal pain, anorexia, nausea, vomiting, diarrhea  or change in bowel habits or change in bladder habits, change in stools or change in urine, dysuria, hematuria,  rash, arthralgias, visual complaints, headache, numbness, weakness or ataxia or problems with walking or coordination,  change in mood or  memory.        Current Meds  Medication Sig   allopurinol (ZYLOPRIM) 300 MG tablet Take 300 mg by mouth every morning.    docusate sodium (COLACE) 100 MG capsule Take 100-200 mg by mouth 2 (two) times daily as needed for constipation.   ELIQUIS 5 MG TABS tablet TAKE 1 TABLET BY MOUTH TWICE A DAY (Patient taking differently: Take 5 mg by mouth 2 (two)  times daily.)   famotidine (PEPCID) 20 MG tablet One after supper   furosemide (LASIX) 40 MG tablet TAKE ONE TABLET BY MOUTH DAILY,MAY TAKE EXTRA TAB DAILY AS NEEDED FOR WEIGHT GAIN/SWELLING (Patient taking differently: Take 40 mg by mouth daily.)   gabapentin (NEURONTIN) 300 MG capsule Take 900 mg by mouth at bedtime.   HYDROcodone-acetaminophen (NORCO) 10-325 MG tablet Take 1 tablet by mouth See admin instructions. Take 1 tablet by mouth every 6 to 8 hours as needed for pain.   metoprolol tartrate (LOPRESSOR) 25 MG tablet Take 12.5 mg by mouth 2 (two) times daily.   pantoprazole (PROTONIX) 40 MG tablet TAKE 1 TABLET (40 MG TOTAL) BY MOUTH DAILY. TAKE 30-60 MIN BEFORE FIRST  MEAL OF THE DAY   SYNTHROID 112 MCG tablet Take 112 mcg by mouth daily at 2 am.             Past Medical History:  Diagnosis Date   Arthritis    Atrial fibrillation (HCC)    BPH (benign prostatic hypertrophy)    CAD (coronary artery disease)    a. s/p DES 05/2014 at [facility])    Complication of anesthesia    BECAME COMBATIVE   Dyspnea    Dysrhythmia    Femur fracture (HCC)    GERD (gastroesophageal reflux disease)    occasional TUMS   Gout    History of gout    History of kidney stones    Hypertension    Hypothyroidism    Persistent atrial fibrillation (HCC)    Precancerous skin lesion    Renal disorder        Objective:       Wt Readings from Last 3 Encounters:  12/10/21 265 lb 12.8 oz (120.6 kg)  09/24/21 266 lb 9.6 oz (120.9 kg)  06/12/21 252 lb 12.8 oz (114.7 kg)      Vital signs reviewed  12/10/2021  - Note at rest 02 sats  97% on RA   General appearance:    obese pleasant wm nad   HEENT : Oropharynx  clear      Nasal turbinates nl    NECK :  without  apparent JVD/ palpable Nodes/TM    LUNGS: no acc muscle use,  Nl contour chest which is clear to A and P bilaterally without cough on insp or exp maneuvers   CV:  RRR  no s3 or murmur or increase in P2, and no edema   ABD:  obese soft and nontender with nl inspiratory excursion in the supine position. No bruits or organomegaly appreciated   MS:  Nl gait/ ext warm without deformities Or obvious joint restrictions  calf tenderness, cyanosis or clubbing    SKIN: warm and dry without lesions    NEURO:  alert, approp, nl sensorium with  no motor or cerebellar deficits apparent.            Assessment

## 2021-12-10 NOTE — Patient Instructions (Signed)
Stay on the acid reflux medications and diet   PFTs next available at the [facility] office and If I'm there that day I will see you, otherwise I will just call you    To get the most out of exercise, you need to be continuously aware that you are short of breath, but never out of breath, for at least 30 minutes daily. As you improve, it will actually be easier for you to do the same amount of exercise  in  30 minutes so always push to the level where you are short of breath.     Make sure you check your oxygen saturations at highest level of activity.     Add last echo suggested mild PH > needs ONO on RA

## 2021-12-11 ENCOUNTER — Encounter: Payer: Self-pay | Admitting: Internal Medicine

## 2021-12-11 ENCOUNTER — Telehealth: Payer: Self-pay | Admitting: Internal Medicine

## 2021-12-11 DIAGNOSIS — R350 Frequency of micturition: Secondary | ICD-10-CM | POA: Diagnosis not present

## 2021-12-11 DIAGNOSIS — I509 Heart failure, unspecified: Secondary | ICD-10-CM | POA: Diagnosis not present

## 2021-12-11 DIAGNOSIS — R3911 Hesitancy of micturition: Secondary | ICD-10-CM | POA: Diagnosis not present

## 2021-12-11 DIAGNOSIS — Z7901 Long term (current) use of anticoagulants: Secondary | ICD-10-CM | POA: Diagnosis not present

## 2021-12-11 DIAGNOSIS — R300 Dysuria: Secondary | ICD-10-CM | POA: Diagnosis not present

## 2021-12-11 DIAGNOSIS — R0609 Other forms of dyspnea: Secondary | ICD-10-CM

## 2021-12-11 DIAGNOSIS — E669 Obesity, unspecified: Secondary | ICD-10-CM | POA: Diagnosis not present

## 2021-12-11 DIAGNOSIS — Z8679 Personal history of other diseases of the circulatory system: Secondary | ICD-10-CM | POA: Diagnosis not present

## 2021-12-11 NOTE — Assessment & Plan Note (Signed)
Complicated by HBP/ hyperlipidemia and very low ERV on PFTs  06/2016   Body mass index is 37.07 kg/m.  -   Lab Results  Component Value Date   TSH 10.423 (H) 05/21/2021      Contributing to doe and risk of GERD >>>   reviewed the need and the process to achieve and maintain neg calorie balance > defer f/u primary care including intermittently monitoring thyroid status      Each maintenance medication was reviewed in detail including emphasizing most importantly the difference between maintenance and prns and under what circumstances the prns are to be triggered using an action plan format where appropriate.  Total time for H and P, chart review, counseling,  directly observing portions of ambulatory 02 saturation study/ and generating customized AVS unique to this office visit / same day charting = 32 min

## 2021-12-11 NOTE — Assessment & Plan Note (Signed)
Onset around 2015  - PFT's  07/10/16   FEV1 2.69 (77 % ) ratio 0.78  p5 % improvement from saba p 0 prior to study with DLCO  23.17 (66%) corrects to 86%  for alv volume and FV curve truncated insp loop and ERV 7% at wt 263  - 09/24/2021   Walked on RA  x  3  lap(s) =  approx 450   ft  @ mod fast pace, stopped due to end of study cc sob on 2nd and 3rd laps  with lowest 02 sats 95%   - 12/10/2021   Walked on RA  x  3  lap(s) =  approx 450  ft  @ mod pace, stopped due to end of study / sob at end  with lowest 02 sats 97%     Needs pfts and continued f/u by cards  - his last ech 04/2021 suggests mild PH which may be related to noct hypoxemia so rec ONO RA if not formal sleep eval and continue work on wt loss by sub max ex up to 30 min daily

## 2021-12-11 NOTE — Telephone Encounter (Signed)
Chart review indicates R side of heart pressures slt elevated   - he should discuss this with his cardiologist next visit as will need long term atterntion and in meantime needs ONO on RA

## 2021-12-11 NOTE — Telephone Encounter (Signed)
Called and spoke to patient and he voiced understanding about the message from Dr. Melvyn Novas. He asked that we send his office visit and this note to his cardiologist.  Patient was agreeable to ONO on RA.

## 2021-12-12 DIAGNOSIS — R3914 Feeling of incomplete bladder emptying: Secondary | ICD-10-CM | POA: Diagnosis not present

## 2021-12-12 DIAGNOSIS — R3915 Urgency of urination: Secondary | ICD-10-CM | POA: Diagnosis not present

## 2021-12-12 DIAGNOSIS — R350 Frequency of micturition: Secondary | ICD-10-CM | POA: Diagnosis not present

## 2021-12-13 DIAGNOSIS — R350 Frequency of micturition: Secondary | ICD-10-CM | POA: Diagnosis not present

## 2021-12-14 ENCOUNTER — Emergency Department (HOSPITAL_COMMUNITY): Payer: Medicare HMO

## 2021-12-14 ENCOUNTER — Other Ambulatory Visit: Payer: Self-pay

## 2021-12-14 ENCOUNTER — Encounter (HOSPITAL_COMMUNITY): Payer: Self-pay | Admitting: Emergency Medicine

## 2021-12-14 ENCOUNTER — Emergency Department (HOSPITAL_COMMUNITY)
Admission: EM | Admit: 2021-12-14 | Discharge: 2021-12-15 | Disposition: A | Discharge status: Home / Self Care | Payer: Medicare HMO | Attending: Emergency Medicine | Admitting: Emergency Medicine

## 2021-12-14 DIAGNOSIS — E039 Hypothyroidism, unspecified: Secondary | ICD-10-CM | POA: Diagnosis not present

## 2021-12-14 DIAGNOSIS — R310 Gross hematuria: Secondary | ICD-10-CM | POA: Diagnosis not present

## 2021-12-14 DIAGNOSIS — I7143 Infrarenal abdominal aortic aneurysm, without rupture: Secondary | ICD-10-CM

## 2021-12-14 DIAGNOSIS — J9811 Atelectasis: Secondary | ICD-10-CM | POA: Diagnosis not present

## 2021-12-14 DIAGNOSIS — I10 Essential (primary) hypertension: Secondary | ICD-10-CM | POA: Insufficient documentation

## 2021-12-14 DIAGNOSIS — Z79899 Other long term (current) drug therapy: Secondary | ICD-10-CM | POA: Diagnosis not present

## 2021-12-14 DIAGNOSIS — R319 Hematuria, unspecified: Secondary | ICD-10-CM | POA: Diagnosis not present

## 2021-12-14 DIAGNOSIS — K5730 Diverticulosis of large intestine without perforation or abscess without bleeding: Secondary | ICD-10-CM | POA: Diagnosis not present

## 2021-12-14 DIAGNOSIS — N281 Cyst of kidney, acquired: Secondary | ICD-10-CM | POA: Diagnosis not present

## 2021-12-14 DIAGNOSIS — N3090 Cystitis, unspecified without hematuria: Secondary | ICD-10-CM | POA: Diagnosis not present

## 2021-12-14 DIAGNOSIS — Z7901 Long term (current) use of anticoagulants: Secondary | ICD-10-CM | POA: Diagnosis not present

## 2021-12-14 DIAGNOSIS — I2510 Atherosclerotic heart disease of native coronary artery without angina pectoris: Secondary | ICD-10-CM | POA: Diagnosis not present

## 2021-12-14 DIAGNOSIS — I700 Atherosclerosis of aorta: Secondary | ICD-10-CM | POA: Diagnosis not present

## 2021-12-14 DIAGNOSIS — I714 Abdominal aortic aneurysm, without rupture, unspecified: Secondary | ICD-10-CM | POA: Diagnosis not present

## 2021-12-14 DIAGNOSIS — N3289 Other specified disorders of bladder: Secondary | ICD-10-CM | POA: Diagnosis not present

## 2021-12-14 LAB — COMPREHENSIVE METABOLIC PANEL
ALT: 20 U/L (ref 0–44)
AST: 26 U/L (ref 15–41)
Albumin: 3.8 g/dL (ref 3.5–5.0)
Alkaline Phosphatase: 53 U/L (ref 38–126)
Anion gap: 11 (ref 5–15)
BUN: 15 mg/dL (ref 8–23)
CO2: 23 mmol/L (ref 22–32)
Calcium: 9.3 mg/dL (ref 8.9–10.3)
Chloride: 105 mmol/L (ref 98–111)
Creatinine, Ser: 1.25 mg/dL — ABNORMAL HIGH (ref 0.61–1.24)
GFR, Estimated: 59 mL/min — ABNORMAL LOW (ref >60)
Glucose, Bld: 105 mg/dL — ABNORMAL HIGH (ref 70–99)
Potassium: 4 mmol/L (ref 3.5–5.1)
Sodium: 139 mmol/L (ref 135–145)
Total Bilirubin: 0.9 mg/dL (ref 0.3–1.2)
Total Protein: 6.7 g/dL (ref 6.5–8.1)

## 2021-12-14 LAB — URINALYSIS, MICROSCOPIC (REFLEX)
Bacteria, UA: NONE SEEN
RBC / HPF: >50 RBC/hpf (ref 0–5)

## 2021-12-14 LAB — LIPASE, BLOOD: Lipase: 33 U/L (ref 11–51)

## 2021-12-14 LAB — CBC WITH DIFFERENTIAL/PLATELET
Abs Immature Granulocytes: 0.01 10*3/uL (ref 0.00–0.07)
Basophils Absolute: 0 10*3/uL (ref 0.0–0.1)
Basophils Relative: 0 %
Eosinophils Absolute: 0 10*3/uL (ref 0.0–0.5)
Eosinophils Relative: 0 %
HCT: 44.7 % (ref 39.0–52.0)
Hemoglobin: 15 g/dL (ref 13.0–17.0)
Immature Granulocytes: 0 %
Lymphocytes Relative: 22 %
Lymphs Abs: 1.5 10*3/uL (ref 0.7–4.0)
MCH: 35.5 pg — ABNORMAL HIGH (ref 26.0–34.0)
MCHC: 33.6 g/dL (ref 30.0–36.0)
MCV: 105.7 fL — ABNORMAL HIGH (ref 80.0–100.0)
Monocytes Absolute: 0.7 10*3/uL (ref 0.1–1.0)
Monocytes Relative: 11 %
Neutro Abs: 4.5 10*3/uL (ref 1.7–7.7)
Neutrophils Relative %: 67 %
Platelets: 131 10*3/uL — ABNORMAL LOW (ref 150–400)
RBC: 4.23 MIL/uL (ref 4.22–5.81)
RDW: 14.2 % (ref 11.5–15.5)
WBC: 6.7 10*3/uL (ref 4.0–10.5)
nRBC: 0 % (ref 0.0–0.2)

## 2021-12-14 LAB — URINALYSIS, ROUTINE W REFLEX MICROSCOPIC
Bilirubin Urine: NEGATIVE
Glucose, UA: NEGATIVE mg/dL
Ketones, ur: NEGATIVE mg/dL
Leukocytes,Ua: NEGATIVE
Nitrite: NEGATIVE
Protein, ur: 100 mg/dL — AB
Specific Gravity, Urine: >1.03 — ABNORMAL HIGH (ref 1.005–1.030)
pH: 5.5 (ref 5.0–8.0)

## 2021-12-14 NOTE — ED Triage Notes (Signed)
Patient reports urinary frequency/dysuria this week with hematuria and right flank pain today , history of renal stones .

## 2021-12-14 NOTE — ED Provider Triage Note (Signed)
Emergency Medicine Provider Triage Evaluation Note  Wayne Ortiz , a 76 y.o. male  was evaluated in triage.  Pt complains of painful urination.  Started 3 days ago.  Endorses hematuria as of today.  History of kidney stones and A-fib.  Also endorses right flank pain. Takes Eliquis.  Review of Systems  Positive: As above Negative: As above  Physical Exam  There were no vitals taken for this visit. Gen:   Awake, no distress   Resp:  Normal effort  MSK:   Moves extremities without difficulty  Other:    Medical Decision Making  Medically screening exam initiated at 8:06 PM.  Appropriate orders placed.  Dorris Singh was informed that the remainder of the evaluation will be completed by another provider, this initial triage assessment does not replace that evaluation, and the importance of remaining in the ED until their evaluation is complete.  Ordered CT renal stone study, abdominal labs, urinalysis with urine culture   Harriet Pho, PA-C 12/14/21 2010

## 2021-12-15 ENCOUNTER — Emergency Department (HOSPITAL_COMMUNITY): Payer: Medicare HMO

## 2021-12-15 ENCOUNTER — Encounter (HOSPITAL_COMMUNITY): Payer: Self-pay | Admitting: Emergency Medicine

## 2021-12-15 DIAGNOSIS — N3289 Other specified disorders of bladder: Secondary | ICD-10-CM | POA: Diagnosis not present

## 2021-12-15 DIAGNOSIS — I2510 Atherosclerotic heart disease of native coronary artery without angina pectoris: Secondary | ICD-10-CM | POA: Diagnosis not present

## 2021-12-15 DIAGNOSIS — K5730 Diverticulosis of large intestine without perforation or abscess without bleeding: Secondary | ICD-10-CM | POA: Diagnosis not present

## 2021-12-15 DIAGNOSIS — J9811 Atelectasis: Secondary | ICD-10-CM | POA: Diagnosis not present

## 2021-12-15 DIAGNOSIS — I714 Abdominal aortic aneurysm, without rupture, unspecified: Secondary | ICD-10-CM | POA: Diagnosis not present

## 2021-12-15 DIAGNOSIS — N281 Cyst of kidney, acquired: Secondary | ICD-10-CM | POA: Diagnosis not present

## 2021-12-15 DIAGNOSIS — I700 Atherosclerosis of aorta: Secondary | ICD-10-CM | POA: Diagnosis not present

## 2021-12-15 MED ORDER — ACETAMINOPHEN 500 MG PO TABS
1000.0000 mg | ORAL_TABLET | Freq: Once | ORAL | Status: DC
  Filled 2021-12-15: qty 2

## 2021-12-15 MED ORDER — DOXYCYCLINE HYCLATE 100 MG PO CAPS: 100.0000 mg | ORAL_CAPSULE | Freq: Two times a day (BID) | ORAL | 0 refills | Status: DC

## 2021-12-15 MED ORDER — IOHEXOL 350 MG/ML SOLN
100.0000 mL | Freq: Once | INTRAVENOUS | Status: AC | PRN
  Administered 2021-12-15: 100 mL via INTRAVENOUS

## 2021-12-15 MED ORDER — KETOROLAC TROMETHAMINE 30 MG/ML IJ SOLN
15.0000 mg | Freq: Once | INTRAMUSCULAR | Status: DC
  Filled 2021-12-15: qty 1

## 2021-12-15 NOTE — ED Provider Notes (Signed)
[facility] EMERGENCY DEPARTMENT Provider Note   CSN: [id number] Arrival date & time: 12/14/21  1954     History  Chief Complaint  Patient presents with   Flank Pain / Hematuria    Wayne Ortiz is a 77 y.o. male.  The history is provided by the patient.  Illness Location:  Bladder Quality:  Spasm pain and gross hematuria on Eliquis Severity:  Moderate Onset quality:  Sudden Timing:  Constant Progression:  Unchanged Chronicity:  New Context:  On Eliquis.  Also on antibiotic and flomax for symptoms Relieved by:  Nothing Worsened by:  Time Ineffective treatments:  Antibiotics Associated symptoms: no abdominal pain, no chest pain, no cough, no fever, no loss of consciousness and no wheezing   Patient on eliquis for AFIB is on antibiotics and flomax for bladder pain and gross hematuria.  No f/c/r     Past Medical History:  Diagnosis Date   Arthritis    Atrial fibrillation (HCC)    BPH (benign prostatic hypertrophy)    CAD (coronary artery disease)    a. s/p DES 05/2014 at [facility])    Complication of anesthesia    BECAME COMBATIVE   Dyspnea    Dysrhythmia    Femur fracture (HCC)    GERD (gastroesophageal reflux disease)    occasional TUMS   Gout    History of gout    History of kidney stones    Hypertension    Hypothyroidism    Persistent atrial fibrillation (HCC)    Precancerous skin lesion    Renal disorder      Home Medications Prior to Admission medications   Medication Sig Start Date End Date Taking? Authorizing Provider  allopurinol (ZYLOPRIM) 300 MG tablet Take 300 mg by mouth every morning.     [provider]  docusate sodium (COLACE) 100 MG capsule Take 100-200 mg by mouth 2 (two) times daily as needed for constipation.    [provider]  ELIQUIS 5 MG TABS tablet TAKE 1 TABLET BY MOUTH TWICE A DAY Patient taking differently: Take 5 mg by mouth 2 (two) times daily. 02/03/18   Belva Crome, MD   famotidine (PEPCID) 20 MG tablet One after supper 09/24/21   Tanda Rockers, MD  furosemide (LASIX) 40 MG tablet TAKE ONE TABLET BY MOUTH DAILY,MAY TAKE EXTRA TAB DAILY AS NEEDED FOR WEIGHT GAIN/SWELLING Patient taking differently: Take 40 mg by mouth daily. 11/17/16   Sherran Needs, NP  gabapentin (NEURONTIN) 300 MG capsule Take 900 mg by mouth at bedtime. 12/13/20   [provider]  HYDROcodone-acetaminophen (NORCO) 10-325 MG tablet Take 1 tablet by mouth See admin instructions. Take 1 tablet by mouth every 6 to 8 hours as needed for pain. 04/16/21   [provider]  metoprolol tartrate (LOPRESSOR) 25 MG tablet Take 12.5 mg by mouth 2 (two) times daily. 04/26/21   [provider]  pantoprazole (PROTONIX) 40 MG tablet TAKE 1 TABLET (40 MG TOTAL) BY MOUTH DAILY. TAKE 30-60 MIN BEFORE FIRST MEAL OF THE DAY 10/02/21   Tanda Rockers, MD  SYNTHROID 112 MCG tablet Take 112 mcg by mouth daily at 2 am. 12/03/20   [provider]      Allergies    Amiodarone, Atorvastatin, Dilaudid [hydromorphone], Hydrocodone, Lisinopril, Metoprolol, Rosuvastatin, Diltiazem, and Rocephin [ceftriaxone sodium in dextrose]    Review of Systems   Review of Systems  Constitutional:  Negative for fever.  HENT:  Negative  for facial swelling.   Respiratory:  Negative for cough, wheezing and stridor.   Cardiovascular:  Negative for chest pain.  Gastrointestinal:  Negative for abdominal pain.  Genitourinary:  Positive for hematuria. Negative for difficulty urinating and penile pain.  Neurological:  Negative for loss of consciousness.  All other systems reviewed and are negative.   Physical Exam Updated Vital Signs BP (!) 120/90 (BP Location: Left Arm)   Pulse 86   Temp 98.9 F (37.2 C) (Oral)   Resp 18   SpO2 98%  Physical Exam Vitals and nursing note reviewed. Exam conducted with a chaperone present.  Constitutional:      General: He is not in acute distress.    Appearance:  He is well-developed. He is not diaphoretic.  HENT:     Head: Normocephalic and atraumatic.     Nose: Nose normal.  Eyes:     Conjunctiva/sclera: Conjunctivae normal.     Pupils: Pupils are equal, round, and reactive to light.  Cardiovascular:     Rate and Rhythm: Normal rate and regular rhythm.     Pulses: Normal pulses.     Heart sounds: Normal heart sounds.  Pulmonary:     Effort: Pulmonary effort is normal.     Breath sounds: Normal breath sounds. No wheezing or rales.  Abdominal:     General: Bowel sounds are normal.     Palpations: Abdomen is soft. There is no mass.     Tenderness: There is no abdominal tenderness. There is no guarding or rebound.     Hernia: No hernia is present.  Musculoskeletal:        General: Normal range of motion.     Cervical back: Normal range of motion and neck supple.  Skin:    General: Skin is warm and dry.     Capillary Refill: Capillary refill takes less than 2 seconds.  Neurological:     General: No focal deficit present.     Mental Status: He is alert and oriented to person, place, and time.     Deep Tendon Reflexes: Reflexes normal.  Psychiatric:        Mood and Affect: Mood normal.        Behavior: Behavior normal.     ED Results / Procedures / Treatments   Labs (all labs ordered are listed, but only abnormal results are displayed) Results for orders placed or performed during the hospital encounter of 12/14/21  Comprehensive metabolic panel  Result Value Ref Range   Sodium 139 135 - 145 mmol/L   Potassium 4.0 3.5 - 5.1 mmol/L   Chloride 105 98 - 111 mmol/L   CO2 23 22 - 32 mmol/L   Glucose, Bld 105 (H) 70 - 99 mg/dL   BUN 15 8 - 23 mg/dL   Creatinine, Ser 1.25 (H) 0.61 - 1.24 mg/dL   Calcium 9.3 8.9 - 10.3 mg/dL   Total Protein 6.7 6.5 - 8.1 g/dL   Albumin 3.8 3.5 - 5.0 g/dL   AST 26 15 - 41 U/L   ALT 20 0 - 44 U/L   Alkaline Phosphatase 53 38 - 126 U/L   Total Bilirubin 0.9 0.3 - 1.2 mg/dL   GFR, Estimated 59 (L) >60  mL/min   Anion gap 11 5 - 15  Lipase, blood  Result Value Ref Range   Lipase 33 11 - 51 U/L  Urinalysis, Routine w reflex microscopic Urine, Clean Catch  Result Value Ref Range   Color, Urine YELLOW YELLOW  APPearance HAZY (A) CLEAR   Specific Gravity, Urine >1.030 (H) 1.005 - 1.030   pH 5.5 5.0 - 8.0   Glucose, UA NEGATIVE NEGATIVE mg/dL   Hgb urine dipstick LARGE (A) NEGATIVE   Bilirubin Urine NEGATIVE NEGATIVE   Ketones, ur NEGATIVE NEGATIVE mg/dL   Protein, ur 100 (A) NEGATIVE mg/dL   Nitrite NEGATIVE NEGATIVE   Leukocytes,Ua NEGATIVE NEGATIVE  CBC with Differential  Result Value Ref Range   WBC 6.7 4.0 - 10.5 K/uL   RBC 4.23 4.22 - 5.81 MIL/uL   Hemoglobin 15.0 13.0 - 17.0 g/dL   HCT 44.7 39.0 - 52.0 %   MCV 105.7 (H) 80.0 - 100.0 fL   MCH 35.5 (H) 26.0 - 34.0 pg   MCHC 33.6 30.0 - 36.0 g/dL   RDW 14.2 11.5 - 15.5 %   Platelets 131 (L) 150 - 400 K/uL   nRBC 0.0 0.0 - 0.2 %   Neutrophils Relative % 67 %   Neutro Abs 4.5 1.7 - 7.7 K/uL   Lymphocytes Relative 22 %   Lymphs Abs 1.5 0.7 - 4.0 K/uL   Monocytes Relative 11 %   Monocytes Absolute 0.7 0.1 - 1.0 K/uL   Eosinophils Relative 0 %   Eosinophils Absolute 0.0 0.0 - 0.5 K/uL   Basophils Relative 0 %   Basophils Absolute 0.0 0.0 - 0.1 K/uL   Immature Granulocytes 0 %   Abs Immature Granulocytes 0.01 0.00 - 0.07 K/uL  Urinalysis, Microscopic (reflex)  Result Value Ref Range   RBC / HPF >50 0 - 5 RBC/hpf   WBC, UA 6-10 0 - 5 WBC/hpf   Bacteria, UA NONE SEEN NONE SEEN   Squamous Epithelial / LPF 0-5 0 - 5   Mucus PRESENT    Hyaline Casts, UA PRESENT    CT Angio Chest/Abd/Pel for Dissection W and/or W/WO  Result Date: 12/15/2021 CLINICAL DATA:  Concern for aortic aneurysm or dissection. EXAM: CT ANGIOGRAPHY CHEST, ABDOMEN AND PELVIS TECHNIQUE: Non-contrast CT of the chest was initially obtained. Multidetector CT imaging through the chest, abdomen and pelvis was performed using the standard protocol during bolus  administration of intravenous contrast. Multiplanar reconstructed images and MIPs were obtained and reviewed to evaluate the vascular anatomy. RADIATION DOSE REDUCTION: This exam was performed according to the departmental dose-optimization program which includes automated exposure control, adjustment of the mA and/or kV according to patient size and/or use of iterative reconstruction technique. CONTRAST:  173m OMNIPAQUE IOHEXOL 350 MG/ML SOLN COMPARISON:  CT abdomen pelvis dated 12/14/2021. Chest CT dated 05/21/2021. FINDINGS: CTA CHEST FINDINGS Cardiovascular: There is no cardiomegaly or pericardial effusion. Three-vessel coronary vascular calcification. Moderate calcified and noncalcified plaque of the thoracic aorta. No aneurysmal dilatation or dissection. There is an area of apparent decreased opacification of the left subclavian artery at the thoracic inlet, likely related to artifact. The subclavian artery remains patent. There is common origin of the left common carotid artery and right brachiocephalic trunk. No pulmonary artery embolus identified. Mediastinum/Nodes: No hilar or mediastinal adenopathy. The esophagus is grossly unremarkable. No mediastinal fluid collection. Lungs/Pleura: Right upper lobe linear atelectasis/scarring. No focal consolidation, pleural effusion, or pneumothorax. The central airways are patent. Musculoskeletal: Degenerative changes of the spine. No acute osseous pathology. Review of the MIP images confirms the above findings. CTA ABDOMEN AND PELVIS FINDINGS VASCULAR Aorta: Moderate atherosclerotic calcification of the abdominal aorta. The aorta measures up to 3.1 cm in maximal diameter distally. There is a focal area of outpouching of the left posterior  aortic wall, likely a penetrating ulcer (212/7). No dissection. No periaortic fluid collection or inflammation. Celiac: Patent without evidence of aneurysm, dissection, vasculitis or significant stenosis. SMA: Linear hypodensity  along the proximal SMA may represent scarring or nonocclusive thrombus. The SMA remains patent. Renals: The renal arteries are patent. IMA: The IMA is patent. Inflow: Moderate atherosclerotic calcification of the iliac arteries. No aneurysm or dissection. The iliac arteries are patent. Veins: No obvious venous abnormality within the limitations of this arterial phase study. Review of the MIP images confirms the above findings. NON-VASCULAR No intra-abdominal free air or free fluid. Hepatobiliary: The liver is unremarkable. Cholecystectomy. No biliary ductal dilatation. Pancreas: Unremarkable. No pancreatic ductal dilatation or surrounding inflammatory changes. Spleen: Normal in size without focal abnormality. Adrenals/Urinary Tract: The adrenal glands are unremarkable. A 2 cm left renal upper pole cyst. There is no hydronephrosis on either side. Subcentimeter exophytic hypodense lesion from the upper pole of the right kidney is suboptimally characterized on this CT. The visualized ureters appear unremarkable. Diffuse bladder wall thickening with perivesical stranding suspicious for cystitis. Stomach/Bowel: There is sigmoid diverticulosis without active inflammatory changes. There is no bowel obstruction or active inflammation. The appendix is normal. Lymphatic: No adenopathy. Reproductive: Mildly enlarged prostate gland. Other: None Musculoskeletal: Osteopenia with degenerative changes of the spine. Partially visualized left femoral intramedullary rod. No acute osseous pathology. Review of the MIP images confirms the above findings. IMPRESSION: 1. No acute intrathoracic, pathology. 2. No aortic dissection. 3. A 3.1 cm infrarenal abdominal aortic aneurysm. Recommend follow-up ultrasound every 3 years. This recommendation follows ACR consensus guidelines: White Paper of the ACR Incidental Findings Committee II on Vascular Findings. J Am Coll Radiol 2013; 10:789-794. 4. Diffuse bladder wall thickening with perivesical  stranding suspicious for cystitis. Correlation with urinalysis is recommended. 5. Sigmoid diverticulosis without active inflammatory changes. No bowel obstruction. Normal appendix. 6. Linear hypodensity along the proximal SMA may represent scarring or nonocclusive thrombus. Electronically Signed   By: Anner Crete M.D.   On: 12/15/2021 02:30   CT Renal Stone Study  Result Date: 12/14/2021 CLINICAL DATA:  Flank pain.  Concern for kidney stone. EXAM: CT ABDOMEN AND PELVIS WITHOUT CONTRAST TECHNIQUE: Multidetector CT imaging of the abdomen and pelvis was performed following the standard protocol without IV contrast. RADIATION DOSE REDUCTION: This exam was performed according to the departmental dose-optimization program which includes automated exposure control, adjustment of the mA and/or kV according to patient size and/or use of iterative reconstruction technique. COMPARISON:  CT abdomen pelvis dated 03/08/2019. FINDINGS: Evaluation of this exam is limited in the absence of intravenous contrast. Lower chest: Minimal bibasilar linear atelectasis/scarring. There is coronary vascular calcification. No intra-abdominal free air or free fluid. Hepatobiliary: The liver is unremarkable. No biliary dilatation. Cholecystectomy. No retained calcified stone noted in the central CBD. Pancreas: Unremarkable. No pancreatic ductal dilatation or surrounding inflammatory changes. Spleen: Normal in size without focal abnormality. Adrenals/Urinary Tract: The adrenal glands are unremarkable. There is a 2 cm left renal upper pole cyst. A subcentimeter exophytic lesion from the upper pole of the right kidney is not characterized on this CT. This can be better evaluated with ultrasound on a nonemergent/outpatient basis. There is no hydronephrosis or nephrolithiasis on either side. The visualized ureters appear unremarkable. The urinary bladder is collapsed. There is inflammatory changes and stranding of the perivesical fat  concerning for cystitis. Correlation with urinalysis recommended. Stomach/Bowel: There is sigmoid diverticulosis without active inflammatory changes. There is moderate stool throughout the colon. No bowel obstruction  or active inflammation. The appendix is normal. Vascular/Lymphatic: Moderate aortoiliac atherosclerotic disease. There is a 3 cm infrarenal abdominal aortic aneurysm. Recommend follow-up ultrasound every 3 years. This recommendation follows ACR consensus guidelines: White Paper of the ACR Incidental Findings Committee II on Vascular Findings. J Am Coll Radiol 2013; 10:789-794. the IVC is unremarkable. No portal venous gas. There is no adenopathy. Reproductive: Mildly enlarged prostate gland measuring 5.5 cm in transverse axial diameter. The seminal vesicles are symmetric. Other: Small right inguinal and umbilical fat containing hernias. Musculoskeletal: Degenerative changes of the spine. Partially visualized left femoral intramedullary nail. No acute osseous pathology. IMPRESSION: 1. No hydronephrosis or nephrolithiasis. 2. Inflammatory changes of the perivesical fat concerning for cystitis. Correlation with urinalysis recommended. 3. Sigmoid diverticulosis. No bowel obstruction. Normal appendix. 4. A 3 cm infrarenal abdominal aortic aneurysm. Follow-up with ultrasound every 3 years recommended. 5. Aortic Atherosclerosis (ICD10-I70.0). Electronically Signed   By: Anner Crete M.D.   On: 12/14/2021 21:09     Radiology CT Angio Chest/Abd/Pel for Dissection W and/or W/WO  Result Date: 12/15/2021 CLINICAL DATA:  Concern for aortic aneurysm or dissection. EXAM: CT ANGIOGRAPHY CHEST, ABDOMEN AND PELVIS TECHNIQUE: Non-contrast CT of the chest was initially obtained. Multidetector CT imaging through the chest, abdomen and pelvis was performed using the standard protocol during bolus administration of intravenous contrast. Multiplanar reconstructed images and MIPs were obtained and reviewed to evaluate  the vascular anatomy. RADIATION DOSE REDUCTION: This exam was performed according to the departmental dose-optimization program which includes automated exposure control, adjustment of the mA and/or kV according to patient size and/or use of iterative reconstruction technique. CONTRAST:  143m OMNIPAQUE IOHEXOL 350 MG/ML SOLN COMPARISON:  CT abdomen pelvis dated 12/14/2021. Chest CT dated 05/21/2021. FINDINGS: CTA CHEST FINDINGS Cardiovascular: There is no cardiomegaly or pericardial effusion. Three-vessel coronary vascular calcification. Moderate calcified and noncalcified plaque of the thoracic aorta. No aneurysmal dilatation or dissection. There is an area of apparent decreased opacification of the left subclavian artery at the thoracic inlet, likely related to artifact. The subclavian artery remains patent. There is common origin of the left common carotid artery and right brachiocephalic trunk. No pulmonary artery embolus identified. Mediastinum/Nodes: No hilar or mediastinal adenopathy. The esophagus is grossly unremarkable. No mediastinal fluid collection. Lungs/Pleura: Right upper lobe linear atelectasis/scarring. No focal consolidation, pleural effusion, or pneumothorax. The central airways are patent. Musculoskeletal: Degenerative changes of the spine. No acute osseous pathology. Review of the MIP images confirms the above findings. CTA ABDOMEN AND PELVIS FINDINGS VASCULAR Aorta: Moderate atherosclerotic calcification of the abdominal aorta. The aorta measures up to 3.1 cm in maximal diameter distally. There is a focal area of outpouching of the left posterior aortic wall, likely a penetrating ulcer (212/7). No dissection. No periaortic fluid collection or inflammation. Celiac: Patent without evidence of aneurysm, dissection, vasculitis or significant stenosis. SMA: Linear hypodensity along the proximal SMA may represent scarring or nonocclusive thrombus. The SMA remains patent. Renals: The renal arteries  are patent. IMA: The IMA is patent. Inflow: Moderate atherosclerotic calcification of the iliac arteries. No aneurysm or dissection. The iliac arteries are patent. Veins: No obvious venous abnormality within the limitations of this arterial phase study. Review of the MIP images confirms the above findings. NON-VASCULAR No intra-abdominal free air or free fluid. Hepatobiliary: The liver is unremarkable. Cholecystectomy. No biliary ductal dilatation. Pancreas: Unremarkable. No pancreatic ductal dilatation or surrounding inflammatory changes. Spleen: Normal in size without focal abnormality. Adrenals/Urinary Tract: The adrenal glands are unremarkable. A 2 cm left  renal upper pole cyst. There is no hydronephrosis on either side. Subcentimeter exophytic hypodense lesion from the upper pole of the right kidney is suboptimally characterized on this CT. The visualized ureters appear unremarkable. Diffuse bladder wall thickening with perivesical stranding suspicious for cystitis. Stomach/Bowel: There is sigmoid diverticulosis without active inflammatory changes. There is no bowel obstruction or active inflammation. The appendix is normal. Lymphatic: No adenopathy. Reproductive: Mildly enlarged prostate gland. Other: None Musculoskeletal: Osteopenia with degenerative changes of the spine. Partially visualized left femoral intramedullary rod. No acute osseous pathology. Review of the MIP images confirms the above findings. IMPRESSION: 1. No acute intrathoracic, pathology. 2. No aortic dissection. 3. A 3.1 cm infrarenal abdominal aortic aneurysm. Recommend follow-up ultrasound every 3 years. This recommendation follows ACR consensus guidelines: White Paper of the ACR Incidental Findings Committee II on Vascular Findings. J Am Coll Radiol 2013; 10:789-794. 4. Diffuse bladder wall thickening with perivesical stranding suspicious for cystitis. Correlation with urinalysis is recommended. 5. Sigmoid diverticulosis without active  inflammatory changes. No bowel obstruction. Normal appendix. 6. Linear hypodensity along the proximal SMA may represent scarring or nonocclusive thrombus. Electronically Signed   By: Anner Crete M.D.   On: 12/15/2021 02:30   CT Renal Stone Study  Result Date: 12/14/2021 CLINICAL DATA:  Flank pain.  Concern for kidney stone. EXAM: CT ABDOMEN AND PELVIS WITHOUT CONTRAST TECHNIQUE: Multidetector CT imaging of the abdomen and pelvis was performed following the standard protocol without IV contrast. RADIATION DOSE REDUCTION: This exam was performed according to the departmental dose-optimization program which includes automated exposure control, adjustment of the mA and/or kV according to patient size and/or use of iterative reconstruction technique. COMPARISON:  CT abdomen pelvis dated 03/08/2019. FINDINGS: Evaluation of this exam is limited in the absence of intravenous contrast. Lower chest: Minimal bibasilar linear atelectasis/scarring. There is coronary vascular calcification. No intra-abdominal free air or free fluid. Hepatobiliary: The liver is unremarkable. No biliary dilatation. Cholecystectomy. No retained calcified stone noted in the central CBD. Pancreas: Unremarkable. No pancreatic ductal dilatation or surrounding inflammatory changes. Spleen: Normal in size without focal abnormality. Adrenals/Urinary Tract: The adrenal glands are unremarkable. There is a 2 cm left renal upper pole cyst. A subcentimeter exophytic lesion from the upper pole of the right kidney is not characterized on this CT. This can be better evaluated with ultrasound on a nonemergent/outpatient basis. There is no hydronephrosis or nephrolithiasis on either side. The visualized ureters appear unremarkable. The urinary bladder is collapsed. There is inflammatory changes and stranding of the perivesical fat concerning for cystitis. Correlation with urinalysis recommended. Stomach/Bowel: There is sigmoid diverticulosis without active  inflammatory changes. There is moderate stool throughout the colon. No bowel obstruction or active inflammation. The appendix is normal. Vascular/Lymphatic: Moderate aortoiliac atherosclerotic disease. There is a 3 cm infrarenal abdominal aortic aneurysm. Recommend follow-up ultrasound every 3 years. This recommendation follows ACR consensus guidelines: White Paper of the ACR Incidental Findings Committee II on Vascular Findings. J Am Coll Radiol 2013; 10:789-794. the IVC is unremarkable. No portal venous gas. There is no adenopathy. Reproductive: Mildly enlarged prostate gland measuring 5.5 cm in transverse axial diameter. The seminal vesicles are symmetric. Other: Small right inguinal and umbilical fat containing hernias. Musculoskeletal: Degenerative changes of the spine. Partially visualized left femoral intramedullary nail. No acute osseous pathology. IMPRESSION: 1. No hydronephrosis or nephrolithiasis. 2. Inflammatory changes of the perivesical fat concerning for cystitis. Correlation with urinalysis recommended. 3. Sigmoid diverticulosis. No bowel obstruction. Normal appendix. 4. A 3 cm infrarenal abdominal aortic  aneurysm. Follow-up with ultrasound every 3 years recommended. 5. Aortic Atherosclerosis (ICD10-I70.0). Electronically Signed   By: Anner Crete M.D.   On: 12/14/2021 21:09    Procedures Procedures    Medications Ordered in ED Medications  acetaminophen (TYLENOL) tablet 1,000 mg (has no administration in time range)  ketorolac (TORADOL) 30 MG/ML injection 15 mg (has no administration in time range)  iohexol (OMNIPAQUE) 350 MG/ML injection 100 mL (100 mLs Intravenous Contrast Given 12/15/21 0208)    ED Course/ Medical Decision Making/ A&P                           Medical Decision Making Pain with urination and hematuria on an antibiotics does not recall.    Amount and/or Complexity of Data Reviewed External Data Reviewed: notes.    Details: previous notes reviewed Labs:  ordered.    Details: all labs reviewed:  Gross hematuria no UTI.  Normal sodium 139, normal potassium 4, chloride 105, creatinine elevated 1.25. Normal lipase 33.  Normal white count and hemoglobin Radiology: ordered and independent interpretation performed.    Details: no stones on CT, AAA without rupture by me on CTA, Discussion of management or test interpretation with external provider(s): Case d/w Dr. [name] of vascular, already on Eliquis which treats SMA issue seen on CT.  Follow up in office.    Risk OTC drugs. Prescription drug management. Risk Details: Follow up with vascular surgery as an outpatient regarding your aneurysm.  Follow up with urology hematuria.  Patient is already on antibiotics.  You may take tylenol for pain, drink copious liquids. Follow up with both vascular surgery and urology as an outpatient.   Strict return     Final Clinical Impression(s) / ED Diagnoses Final diagnoses:  Bladder spasm   Return for intractable cough, coughing up blood, fevers > 100.4 unrelieved by medication, shortness of breath, intractable vomiting, chest pain, shortness of breath, weakness, numbness, changes in speech, facial asymmetry, abdominal pain, passing out, Inability to tolerate liquids or food, cough, altered mental status or any concerns. No signs of systemic illness or infection. The patient is nontoxic-appearing on exam and vital signs are within normal limits.  I have reviewed the triage vital signs and the nursing notes. Pertinent labs & imaging results that were available during my care of the patient were reviewed by me and considered in my medical decision making (see chart for details). After history, exam, and medical workup I feel the patient has been appropriately medically screened and is safe for discharge home. Pertinent diagnoses were discussed with the patient. Patient was given return precautions.  Rx / DC Orders ED Discharge Orders     None         Kindra Bickham,  Renad Jenniges, MD 12/15/21 [id number]

## 2021-12-16 DIAGNOSIS — R300 Dysuria: Secondary | ICD-10-CM | POA: Diagnosis not present

## 2021-12-16 DIAGNOSIS — R3915 Urgency of urination: Secondary | ICD-10-CM | POA: Diagnosis not present

## 2021-12-16 DIAGNOSIS — R350 Frequency of micturition: Secondary | ICD-10-CM | POA: Diagnosis not present

## 2021-12-16 LAB — URINE CULTURE: Culture: <10000 — AB

## 2021-12-26 DIAGNOSIS — N410 Acute prostatitis: Secondary | ICD-10-CM | POA: Diagnosis not present

## 2022-02-13 DIAGNOSIS — N410 Acute prostatitis: Secondary | ICD-10-CM | POA: Diagnosis not present

## 2022-02-13 DIAGNOSIS — R350 Frequency of micturition: Secondary | ICD-10-CM | POA: Diagnosis not present

## 2022-02-13 DIAGNOSIS — R3912 Poor urinary stream: Secondary | ICD-10-CM | POA: Diagnosis not present

## 2022-02-24 ENCOUNTER — Ambulatory Visit (INDEPENDENT_AMBULATORY_CARE_PROVIDER_SITE_OTHER): Payer: Medicare HMO | Admitting: Internal Medicine

## 2022-02-24 DIAGNOSIS — R0609 Other forms of dyspnea: Secondary | ICD-10-CM

## 2022-02-24 LAB — PULMONARY FUNCTION TEST
DL/VA % pred: 103 %
DL/VA: 4.07 ml/min/mmHg/L
DLCO cor % pred: 78 %
DLCO cor: 19.21 ml/min/mmHg
DLCO unc % pred: 78 %
DLCO unc: 19.21 ml/min/mmHg
FEF 25-75 Post: 2.29 L/sec
FEF 25-75 Pre: 2.24 L/sec
FEF2575-%Change-Post: 2 %
FEF2575-%Pred-Post: 110 %
FEF2575-%Pred-Pre: 107 %
FEV1-%Change-Post: -1 %
FEV1-%Pred-Post: 73 %
FEV1-%Pred-Pre: 74 %
FEV1-Post: 2.15 L
FEV1-Pre: 2.18 L
FEV1FVC-%Change-Post: 2 %
FEV1FVC-%Pred-Pre: 113 %
FEV6-%Change-Post: -3 %
FEV6-%Pred-Post: 67 %
FEV6-%Pred-Pre: 69 %
FEV6-Post: 2.56 L
FEV6-Pre: 2.66 L
FEV6FVC-%Pred-Post: 106 %
FEV6FVC-%Pred-Pre: 106 %
FVC-%Change-Post: -3 %
FVC-%Pred-Post: 62 %
FVC-%Pred-Pre: 65 %
FVC-Post: 2.56 L
FVC-Pre: 2.66 L
Post FEV1/FVC ratio: 84 %
Post FEV6/FVC ratio: 100 %
Pre FEV1/FVC ratio: 82 %
Pre FEV6/FVC Ratio: 100 %
RV % pred: 81 %
RV: 2.09 L
TLC % pred: 68 %
TLC: 4.76 L

## 2022-02-24 NOTE — Progress Notes (Signed)
Full PFT Performed Today  

## 2022-02-24 NOTE — Patient Instructions (Signed)
Full PFT Performed Today  

## 2022-03-03 ENCOUNTER — Other Ambulatory Visit: Payer: Self-pay | Admitting: *Deleted

## 2022-03-03 DIAGNOSIS — J84112 Idiopathic pulmonary fibrosis: Secondary | ICD-10-CM

## 2022-03-03 DIAGNOSIS — R0609 Other forms of dyspnea: Secondary | ICD-10-CM

## 2022-03-03 NOTE — Progress Notes (Signed)
Spoke with pt and notified of results per Dr. Wert. Pt verbalized understanding and denied any questions. 

## 2022-03-04 DIAGNOSIS — Z6841 Body Mass Index (BMI) 40.0 and over, adult: Secondary | ICD-10-CM | POA: Diagnosis not present

## 2022-03-04 DIAGNOSIS — R69 Illness, unspecified: Secondary | ICD-10-CM | POA: Diagnosis not present

## 2022-03-04 DIAGNOSIS — M5136 Other intervertebral disc degeneration, lumbar region: Secondary | ICD-10-CM | POA: Diagnosis not present

## 2022-03-27 DIAGNOSIS — J84112 Idiopathic pulmonary fibrosis: Secondary | ICD-10-CM | POA: Diagnosis not present

## 2022-04-07 DIAGNOSIS — C4402 Squamous cell carcinoma of skin of lip: Secondary | ICD-10-CM | POA: Diagnosis not present

## 2022-04-13 ENCOUNTER — Encounter: Payer: Self-pay | Admitting: Internal Medicine

## 2022-04-13 DIAGNOSIS — G4734 Idiopathic sleep related nonobstructive alveolar hypoventilation: Secondary | ICD-10-CM | POA: Insufficient documentation

## 2022-04-15 ENCOUNTER — Telehealth: Payer: Self-pay

## 2022-04-15 DIAGNOSIS — G4734 Idiopathic sleep related nonobstructive alveolar hypoventilation: Secondary | ICD-10-CM

## 2022-04-15 NOTE — Telephone Encounter (Signed)
ONO 03/08/22  02 sats < 89% x 43 min  with ? Cor pulmonale on ech so rec 2pm and repeat ONO on 2lpm 04/13/2022 >>>    Called and left patient a detailed vm (ok per dpr) on home answering machine with results and recs to repeat ONO on 2LO2. Advised him that DME would bring O2 and test and to call back if he had any questions.

## 2022-05-09 ENCOUNTER — Telehealth: Payer: Self-pay | Admitting: Internal Medicine

## 2022-05-09 DIAGNOSIS — G4734 Idiopathic sleep related nonobstructive alveolar hypoventilation: Secondary | ICD-10-CM

## 2022-05-09 NOTE — Telephone Encounter (Signed)
Spoke with Wayne Ortiz who states he hasn't received 02 to wear at night. Order was placed on 04/15/22. PCCs can you please advise on the status of this O2 order.   Dr. Melvyn Novas Wayne Ortiz states that he would like humidity added to his O2 when he gets it. Are you ok with adding this order?

## 2022-05-09 NOTE — Telephone Encounter (Signed)
Ok to add water and troubleshoot why 02 not delivered yet   Once it is delivered need to repeat ono on 2lpm to assure this is right flow

## 2022-05-09 NOTE — Telephone Encounter (Signed)
Has a home sleep study today and must take it w/O2. How does he do that? He has no O2. Pls call to advise.

## 2022-05-09 NOTE — Telephone Encounter (Signed)
Added humidity order.

## 2022-05-13 NOTE — Telephone Encounter (Signed)
Called and spoke with rep at [facility]. She confirmed that they have not received the order for O2. She stated that the only order received was for the patient's repeat ONO on 2L. I advised her that I would see if the PCCs can resend the order from 12/19.  [facility], please resend the order from 12/19 to [employer]. Thanks.

## 2022-05-13 NOTE — Telephone Encounter (Signed)
Patient checking on the order for oxygen. Patient phone number is [phone].

## 2022-05-13 NOTE — Telephone Encounter (Signed)
Called patient but he did not answer. Left message for him to call back.   Will forward message to MW so he is aware why the patient has not received his O2.

## 2022-05-13 NOTE — Telephone Encounter (Signed)
Patient is returning phone call, Patient phone number is [phone].

## 2022-05-13 NOTE — Telephone Encounter (Signed)
Called the pt and there was no answer- LMTCB   In the meantime will forward to [facility] to see why o2 order still not completed, thanks.

## 2022-05-13 NOTE — Telephone Encounter (Signed)
Spoke to [place] at [place] - order that was placed on 12/19 for O2 states was off of ono that was done on 11/10.  This was over 30 days.  Office visit, ONO and order for O2 all have to be done within 30 days.  [employer] result shows was faxed to [place] on 11/30.  MW's handwritten notes on ono result were dated 12/17.  New ono order was placed on 12/20 and states to be done on 2 liters of O2.  This was sent to [employer] on 1/4 but can't be used due to pt is not on 2 liters. Upon further review of chart pt hasn't been seen since August.  He will need ov to receive O2.  Will route back to triage - pt will need ov & walk.

## 2022-05-14 NOTE — Telephone Encounter (Signed)
Patient would like a call regarding the status of his ONO.  CB# [phone]

## 2022-05-15 DIAGNOSIS — I5032 Chronic diastolic (congestive) heart failure: Secondary | ICD-10-CM | POA: Diagnosis not present

## 2022-05-15 DIAGNOSIS — E785 Hyperlipidemia, unspecified: Secondary | ICD-10-CM | POA: Diagnosis not present

## 2022-05-15 DIAGNOSIS — Z87891 Personal history of nicotine dependence: Secondary | ICD-10-CM | POA: Diagnosis not present

## 2022-05-15 DIAGNOSIS — I110 Hypertensive heart disease with heart failure: Secondary | ICD-10-CM | POA: Diagnosis not present

## 2022-05-15 DIAGNOSIS — I10 Essential (primary) hypertension: Secondary | ICD-10-CM | POA: Diagnosis not present

## 2022-05-15 DIAGNOSIS — I471 Supraventricular tachycardia, unspecified: Secondary | ICD-10-CM | POA: Diagnosis not present

## 2022-05-15 DIAGNOSIS — I2510 Atherosclerotic heart disease of native coronary artery without angina pectoris: Secondary | ICD-10-CM | POA: Diagnosis not present

## 2022-05-15 DIAGNOSIS — R0609 Other forms of dyspnea: Secondary | ICD-10-CM | POA: Diagnosis not present

## 2022-05-15 DIAGNOSIS — I482 Chronic atrial fibrillation, unspecified: Secondary | ICD-10-CM | POA: Diagnosis not present

## 2022-05-15 DIAGNOSIS — I4729 Other ventricular tachycardia: Secondary | ICD-10-CM | POA: Diagnosis not present

## 2022-05-15 DIAGNOSIS — I480 Paroxysmal atrial fibrillation: Secondary | ICD-10-CM | POA: Diagnosis not present

## 2022-05-15 DIAGNOSIS — I25119 Atherosclerotic heart disease of native coronary artery with unspecified angina pectoris: Secondary | ICD-10-CM | POA: Diagnosis not present

## 2022-05-23 NOTE — Telephone Encounter (Signed)
Mardene Celeste from [facility] has called about this - pt called [place] [facility] today.  Pt will need an OV and and ono to qualify for O2 and a new order for O2 will need to be placed once ono is done.  All (ov, ono, and order) have to be within 30 days.

## 2022-05-23 NOTE — Telephone Encounter (Signed)
Pt has an upcoming appt scheduled with Dr. Melvyn Novas 2/2. I have updated pt's appt notes section stating that pt will also need to have ONO ordered during visit. Nothing further needed.

## 2022-05-26 DIAGNOSIS — R69 Illness, unspecified: Secondary | ICD-10-CM | POA: Diagnosis not present

## 2022-05-26 DIAGNOSIS — M5136 Other intervertebral disc degeneration, lumbar region: Secondary | ICD-10-CM | POA: Diagnosis not present

## 2022-05-26 DIAGNOSIS — Z6839 Body mass index (BMI) 39.0-39.9, adult: Secondary | ICD-10-CM | POA: Diagnosis not present

## 2022-05-26 DIAGNOSIS — M25562 Pain in left knee: Secondary | ICD-10-CM | POA: Diagnosis not present

## 2022-05-29 ENCOUNTER — Other Ambulatory Visit (HOSPITAL_COMMUNITY): Payer: Self-pay | Admitting: Neurosurgery

## 2022-05-29 DIAGNOSIS — I482 Chronic atrial fibrillation, unspecified: Secondary | ICD-10-CM | POA: Diagnosis not present

## 2022-05-29 DIAGNOSIS — I480 Paroxysmal atrial fibrillation: Secondary | ICD-10-CM | POA: Diagnosis not present

## 2022-05-29 DIAGNOSIS — G459 Transient cerebral ischemic attack, unspecified: Secondary | ICD-10-CM

## 2022-05-30 ENCOUNTER — Ambulatory Visit (INDEPENDENT_AMBULATORY_CARE_PROVIDER_SITE_OTHER): Payer: Medicare HMO | Admitting: Internal Medicine

## 2022-05-30 ENCOUNTER — Encounter: Payer: Self-pay | Admitting: Internal Medicine

## 2022-05-30 VITALS — BP 126/78 | HR 70 | Ht 71.5 in | Wt 260.2 lb

## 2022-05-30 DIAGNOSIS — G4734 Idiopathic sleep related nonobstructive alveolar hypoventilation: Secondary | ICD-10-CM

## 2022-05-30 DIAGNOSIS — R0609 Other forms of dyspnea: Secondary | ICD-10-CM | POA: Diagnosis not present

## 2022-05-30 NOTE — Patient Instructions (Signed)
We will call you with the results of your oxygen sleep test > we can refer you to sleep medicine if needed but defer formal sleep eval to Dr Juel Burrow discretion   Pulmonary follow up is as needed.

## 2022-05-30 NOTE — Assessment & Plan Note (Signed)
Complicated by HBP/ hyperlipidemia and very low ERV on PFTs  06/2016 and 02/24/22   Body mass index is 35.78 kg/m.  -  trending down  Lab Results  Component Value Date   TSH 10.423 (H) 05/21/2021    Contributing to doe and risk of GERD >>>   reviewed the need and the process to achieve and maintain neg calorie balance > consider swimming or recumbent bicycling > defer f/u primary care including intermittently monitoring thyroid status   >>> pulmonary f/u is prn  Each maintenance medication was reviewed in detail including emphasizing most importantly the difference between maintenance and prns and under what circumstances the prns are to be triggered using an action plan format where appropriate.  Total time for H and P, chart review, counseling,  directly observing portions of ambulatory 02 saturation study/ and generating customized AVS unique to this office visit / same day charting = 30 min

## 2022-05-30 NOTE — Progress Notes (Signed)
Wayne Ortiz, male    DOB: May 19, 1945    MRN: [id number]   Brief patient profile:  58 yowm quit smoking 1975  referred to pulmonary clinic in [place]  09/24/2021 by [place] Crane  for doe  worse p covid in 02/2019  with no evidence of airflow obst on pfts from 06/2016    History of Present Illness  09/24/2021  Pulmonary/ 1st office eval/ Wayne Ortiz / [place] [facility]  Chief Complaint  Patient presents with   Consult    SOB has been worse since having covid.   Dyspnea:  food lion goes slow whole but sometimes can't do  Cough: full of wheeze and congestion in am  Sleep: sleeps on wedge  SABA use: none  Rec Pantoprazole (protonix) 40 mg   Take  30-60 min before first meal of the day and Pepcid (famotidine)  20 mg after supper until return to office  GERD diet/bed blocks To get the most out of exercise, you need to be continuously aware that you are slightly short of breath, but never out of breath, building up to 30 minutes daily.      12/10/2021  f/u ov/[place] office/Wayne Ortiz re: doe x 2015  maint on gerd rx  / afib  Chief Complaint  Patient presents with   Follow-up    Patient says SOB is about the same since last ov. Has periods where SOB is worse   Dyspnea:  walking son's house had stop to catch breath  was a hill  Cough: none  Sleeping: on wedge does fine  SABA use: none / watching TV felt couldn't get breath so went and laid down went away " I think it was afib" 02: none  Covid status: vax  3  Rec Stay on the acid reflux medications and diet  To get the most out of exercise, you need to be continuously aware that you are short of breath, but never out of breath, for at least 30 minutes daily. Make sure you check your oxygen saturations at highest level of activity. Add last echo suggested mild PH > needs ONO on RA   PFTs 02/24/22 wnl x for ERV 9% at wt 266   05/30/2022  f/u ov/[place] office/Wayne Ortiz re: obesity  maint on no rx/ awaiting ono on RA  Chief Complaint  Patient presents  with   Follow-up    Pt f/u states that they never performed the ONO but otherwise he is feeling at his baseline. Reports he has had a good couple of days with his breathing this week  Dyspnea:  ? Maybe afib was better for a few days now worse  Cough: none  Sleeping: wedge/ on side  SABA use: none 02: none     No obvious day to day or daytime variability or assoc excess/ purulent sputum or mucus plugs or hemoptysis or cp or chest tightness, subjective wheeze or overt sinus or hb symptoms.   Sleeping  without nocturnal  or early am exacerbation  of respiratory  c/o's or need for noct saba. Also denies any obvious fluctuation of symptoms with weather or environmental changes or other aggravating or alleviating factors except as outlined above   No unusual exposure hx or h/o childhood pna/ asthma or knowledge of premature birth.  Current Allergies, Complete Past Medical History, Past Surgical History, Family History, and Social History were reviewed in [employer] record.  ROS  The following are not active complaints unless bolded Hoarseness, sore throat, dysphagia, dental  problems, itching, sneezing,  nasal congestion or discharge of excess mucus or purulent secretions, ear ache,   fever, chills, sweats, unintended wt loss or wt gain, classically pleuritic or exertional cp,  orthopnea pnd or arm/hand swelling  or leg swelling, presyncope, palpitations, abdominal pain, anorexia, nausea, vomiting, diarrhea  or change in bowel habits or change in bladder habits, change in stools or change in urine, dysuria, hematuria,  rash, arthralgias/back and hip limiting, visual complaints, headache, numbness, weakness or ataxia or problems with walking or coordination,  change in mood or  memory.        Current Meds  Medication Sig   allopurinol (ZYLOPRIM) 300 MG tablet Take 300 mg by mouth every morning.    docusate sodium (COLACE) 100 MG capsule Take 100-200 mg by mouth 2 (two) times  daily as needed for constipation.   ELIQUIS 5 MG TABS tablet TAKE 1 TABLET BY MOUTH TWICE A DAY (Patient taking differently: Take 5 mg by mouth 2 (two) times daily.)   famotidine (PEPCID) 20 MG tablet One after supper   furosemide (LASIX) 40 MG tablet TAKE ONE TABLET BY MOUTH DAILY,MAY TAKE EXTRA TAB DAILY AS NEEDED FOR WEIGHT GAIN/SWELLING (Patient taking differently: Take 40 mg by mouth daily.)   gabapentin (NEURONTIN) 300 MG capsule Take 900 mg by mouth at bedtime.   HYDROcodone-acetaminophen (NORCO) 10-325 MG tablet Take 1 tablet by mouth See admin instructions. Take 1 tablet by mouth every 6 to 8 hours as needed for pain.   metoprolol tartrate (LOPRESSOR) 25 MG tablet Take 12.5 mg by mouth 2 (two) times daily.   SYNTHROID 112 MCG tablet Take 112 mcg by mouth daily at 2 am.                 Past Medical History:  Diagnosis Date   Arthritis    Atrial fibrillation (HCC)    BPH (benign prostatic hypertrophy)    CAD (coronary artery disease)    a. s/p DES 05/2014 at [facility])    Complication of anesthesia    BECAME COMBATIVE   Dyspnea    Dysrhythmia    Femur fracture (HCC)    GERD (gastroesophageal reflux disease)    occasional TUMS   Gout    History of gout    History of kidney stones    Hypertension    Hypothyroidism    Persistent atrial fibrillation (HCC)    Precancerous skin lesion    Renal disorder        Objective:       05/30/2022         260   12/10/21 265 lb 12.8 oz (120.6 kg)  09/24/21 266 lb 9.6 oz (120.9 kg)  06/12/21 252 lb 12.8 oz (114.7 kg)    Vital signs reviewed  05/30/2022  - Note at rest 02 sats  94% on RA   General appearance:    obese amb wm nad   HEENT : Oropharynx  clear/ upper full plate/ lower partial          NECK :  without  apparent JVD/ palpable Nodes/TM    LUNGS: no acc muscle use,  Nl contour chest which is clear to A and P bilaterally without cough on insp or exp maneuvers   CV:  slt IRR  no s3 or murmur or increase in  P2, and no edema   ABD:  quite obese/ soft and nontender 1  MS:  Nl gait/ ext warm without deformities Or obvious  joint restrictions  calf tenderness, cyanosis or clubbing    SKIN: warm and dry without lesions    NEURO:  alert, approp, nl sensorium with  no motor or cerebellar deficits apparent.          I personally reviewed images and agree with radiology impression as follows:   Chest CTa    12/15/21  Lungs/Pleura: Right upper lobe linear atelectasis/scarring. No focal consolidation, pleural effusion, or pneumothorax. The central airways are patent.      Assessment

## 2022-05-30 NOTE — Assessment & Plan Note (Signed)
Onset around 2015  - PFT's  07/10/16   FEV1 2.69 (77 % ) ratio 0.78  p5 % improvement from saba p 0 prior to study with DLCO  23.17 (66%) corrects to 86%  for alv volume and FV curve truncated insp loop and ERV 7% at wt 263  - 09/24/2021   Walked on RA  x  3  lap(s) =  approx 450   ft  @ mod fast pace, stopped due to end of study cc sob on 2nd and 3rd laps  with lowest 02 sats 95%   - 12/10/2021   Walked on RA  x  3  lap(s) =  approx 450  ft  @ mod pace, stopped due to end of study / sob at end  with lowest 02 sats 97%   - PFTs  02/24/22 wnl x for ERV 9% at wt 266  - 05/30/2022   Walked on RA  x  3  lap(s) =  approx 450  ft  @ mod pace, stopped due to end of study s bob  with lowest 02 sats 93%   Reviewed pfts with pt which do not indicate any significant problem x the low ERV typical of his body habitus so pulmonar  f/u can be prn

## 2022-06-03 ENCOUNTER — Telehealth: Payer: Self-pay | Admitting: Internal Medicine

## 2022-06-03 DIAGNOSIS — G4734 Idiopathic sleep related nonobstructive alveolar hypoventilation: Secondary | ICD-10-CM

## 2022-06-03 NOTE — Telephone Encounter (Signed)
Wayne Ortiz, [name]; [name], [name] Please have provider update rx and specify whether: room air, O2, cpap, etc.  Thanks.   Please update ono order per [employer]

## 2022-06-03 NOTE — Telephone Encounter (Signed)
Waiting on provider signature

## 2022-06-03 NOTE — Telephone Encounter (Signed)
I have not seen the results of ONO RA to justify any rx from this office

## 2022-06-03 NOTE — Telephone Encounter (Signed)
[name] I d/c'd the old order and just placed a new one with instructions, Thanks!

## 2022-06-06 ENCOUNTER — Other Ambulatory Visit: Payer: Self-pay | Admitting: Internal Medicine

## 2022-06-09 DIAGNOSIS — D485 Neoplasm of uncertain behavior of skin: Secondary | ICD-10-CM | POA: Diagnosis not present

## 2022-06-09 DIAGNOSIS — G4736 Sleep related hypoventilation in conditions classified elsewhere: Secondary | ICD-10-CM | POA: Diagnosis not present

## 2022-06-09 DIAGNOSIS — Z85828 Personal history of other malignant neoplasm of skin: Secondary | ICD-10-CM | POA: Diagnosis not present

## 2022-06-09 DIAGNOSIS — L821 Other seborrheic keratosis: Secondary | ICD-10-CM | POA: Diagnosis not present

## 2022-06-09 DIAGNOSIS — B078 Other viral warts: Secondary | ICD-10-CM | POA: Diagnosis not present

## 2022-06-09 DIAGNOSIS — L858 Other specified epidermal thickening: Secondary | ICD-10-CM | POA: Diagnosis not present

## 2022-06-09 DIAGNOSIS — L0889 Other specified local infections of the skin and subcutaneous tissue: Secondary | ICD-10-CM | POA: Diagnosis not present

## 2022-06-09 DIAGNOSIS — Z08 Encounter for follow-up examination after completed treatment for malignant neoplasm: Secondary | ICD-10-CM | POA: Diagnosis not present

## 2022-06-09 DIAGNOSIS — Z789 Other specified health status: Secondary | ICD-10-CM | POA: Diagnosis not present

## 2022-06-09 DIAGNOSIS — L814 Other melanin hyperpigmentation: Secondary | ICD-10-CM | POA: Diagnosis not present

## 2022-06-09 DIAGNOSIS — L570 Actinic keratosis: Secondary | ICD-10-CM | POA: Diagnosis not present

## 2022-06-09 DIAGNOSIS — L298 Other pruritus: Secondary | ICD-10-CM | POA: Diagnosis not present

## 2022-06-09 DIAGNOSIS — C44319 Basal cell carcinoma of skin of other parts of face: Secondary | ICD-10-CM | POA: Diagnosis not present

## 2022-06-09 DIAGNOSIS — L538 Other specified erythematous conditions: Secondary | ICD-10-CM | POA: Diagnosis not present

## 2022-06-18 ENCOUNTER — Ambulatory Visit (HOSPITAL_COMMUNITY)
Admission: RE | Admit: 2022-06-18 | Discharge: 2022-06-18 | Disposition: A | Discharge status: Home / Self Care | Payer: Medicare HMO | Source: Ambulatory Visit | Attending: Neurosurgery | Admitting: Neurosurgery

## 2022-06-18 DIAGNOSIS — G459 Transient cerebral ischemic attack, unspecified: Secondary | ICD-10-CM | POA: Insufficient documentation

## 2022-06-20 ENCOUNTER — Ambulatory Visit (HOSPITAL_COMMUNITY): Admission: RE | Admit: 2022-06-20 | Payer: Medicare HMO | Source: Ambulatory Visit

## 2022-06-24 DIAGNOSIS — G459 Transient cerebral ischemic attack, unspecified: Secondary | ICD-10-CM | POA: Diagnosis not present

## 2022-06-24 DIAGNOSIS — Z6838 Body mass index (BMI) 38.0-38.9, adult: Secondary | ICD-10-CM | POA: Diagnosis not present

## 2022-07-02 DIAGNOSIS — I480 Paroxysmal atrial fibrillation: Secondary | ICD-10-CM | POA: Diagnosis not present

## 2022-07-03 ENCOUNTER — Telehealth: Payer: Self-pay | Admitting: Internal Medicine

## 2022-07-03 NOTE — Telephone Encounter (Signed)
Ono on 2lpm ok 05/02/22 no change rx

## 2022-07-04 NOTE — Telephone Encounter (Signed)
Noted. ONO order is in and double checked to make sure it states on 2L. Nothing further needed

## 2022-07-15 DIAGNOSIS — C44319 Basal cell carcinoma of skin of other parts of face: Secondary | ICD-10-CM | POA: Diagnosis not present

## 2022-07-22 DIAGNOSIS — C44319 Basal cell carcinoma of skin of other parts of face: Secondary | ICD-10-CM | POA: Diagnosis not present

## 2022-08-07 DIAGNOSIS — H524 Presbyopia: Secondary | ICD-10-CM | POA: Diagnosis not present

## 2022-08-07 DIAGNOSIS — H40023 Open angle with borderline findings, high risk, bilateral: Secondary | ICD-10-CM | POA: Diagnosis not present

## 2022-08-07 DIAGNOSIS — Z961 Presence of intraocular lens: Secondary | ICD-10-CM | POA: Diagnosis not present

## 2022-08-22 DIAGNOSIS — E039 Hypothyroidism, unspecified: Secondary | ICD-10-CM | POA: Diagnosis not present

## 2022-08-22 DIAGNOSIS — E782 Mixed hyperlipidemia: Secondary | ICD-10-CM | POA: Diagnosis not present

## 2022-08-22 DIAGNOSIS — R7303 Prediabetes: Secondary | ICD-10-CM | POA: Diagnosis not present

## 2022-08-22 DIAGNOSIS — I10 Essential (primary) hypertension: Secondary | ICD-10-CM | POA: Diagnosis not present

## 2022-08-22 DIAGNOSIS — Z131 Encounter for screening for diabetes mellitus: Secondary | ICD-10-CM | POA: Diagnosis not present

## 2022-08-22 DIAGNOSIS — R972 Elevated prostate specific antigen [PSA]: Secondary | ICD-10-CM | POA: Diagnosis not present

## 2022-08-26 DIAGNOSIS — Z9889 Other specified postprocedural states: Secondary | ICD-10-CM | POA: Diagnosis not present

## 2022-08-26 DIAGNOSIS — E039 Hypothyroidism, unspecified: Secondary | ICD-10-CM | POA: Diagnosis not present

## 2022-08-26 DIAGNOSIS — G629 Polyneuropathy, unspecified: Secondary | ICD-10-CM | POA: Diagnosis not present

## 2022-08-26 DIAGNOSIS — Z131 Encounter for screening for diabetes mellitus: Secondary | ICD-10-CM | POA: Diagnosis not present

## 2022-08-26 DIAGNOSIS — I2510 Atherosclerotic heart disease of native coronary artery without angina pectoris: Secondary | ICD-10-CM | POA: Diagnosis not present

## 2022-08-26 DIAGNOSIS — E782 Mixed hyperlipidemia: Secondary | ICD-10-CM | POA: Diagnosis not present

## 2022-08-26 DIAGNOSIS — Z0001 Encounter for general adult medical examination with abnormal findings: Secondary | ICD-10-CM | POA: Diagnosis not present

## 2022-08-26 DIAGNOSIS — Z8673 Personal history of transient ischemic attack (TIA), and cerebral infarction without residual deficits: Secondary | ICD-10-CM | POA: Diagnosis not present

## 2022-08-26 DIAGNOSIS — I10 Essential (primary) hypertension: Secondary | ICD-10-CM | POA: Diagnosis not present

## 2022-08-26 DIAGNOSIS — R7303 Prediabetes: Secondary | ICD-10-CM | POA: Diagnosis not present

## 2022-08-26 DIAGNOSIS — D696 Thrombocytopenia, unspecified: Secondary | ICD-10-CM | POA: Diagnosis not present

## 2022-08-26 DIAGNOSIS — I714 Abdominal aortic aneurysm, without rupture, unspecified: Secondary | ICD-10-CM | POA: Diagnosis not present

## 2022-09-02 DIAGNOSIS — M5136 Other intervertebral disc degeneration, lumbar region: Secondary | ICD-10-CM | POA: Diagnosis not present

## 2022-09-02 DIAGNOSIS — F1120 Opioid dependence, uncomplicated: Secondary | ICD-10-CM | POA: Diagnosis not present

## 2022-09-02 DIAGNOSIS — Z6836 Body mass index (BMI) 36.0-36.9, adult: Secondary | ICD-10-CM | POA: Diagnosis not present

## 2022-11-06 DIAGNOSIS — E785 Hyperlipidemia, unspecified: Secondary | ICD-10-CM | POA: Diagnosis not present

## 2022-11-06 DIAGNOSIS — I110 Hypertensive heart disease with heart failure: Secondary | ICD-10-CM | POA: Diagnosis not present

## 2022-11-06 DIAGNOSIS — I5020 Unspecified systolic (congestive) heart failure: Secondary | ICD-10-CM | POA: Diagnosis not present

## 2022-11-06 DIAGNOSIS — Z87891 Personal history of nicotine dependence: Secondary | ICD-10-CM | POA: Diagnosis not present

## 2022-11-06 DIAGNOSIS — M79605 Pain in left leg: Secondary | ICD-10-CM | POA: Diagnosis not present

## 2022-11-06 DIAGNOSIS — I480 Paroxysmal atrial fibrillation: Secondary | ICD-10-CM | POA: Diagnosis not present

## 2022-11-06 DIAGNOSIS — M79604 Pain in right leg: Secondary | ICD-10-CM | POA: Diagnosis not present

## 2022-11-06 DIAGNOSIS — I2510 Atherosclerotic heart disease of native coronary artery without angina pectoris: Secondary | ICD-10-CM | POA: Diagnosis not present

## 2022-12-03 DIAGNOSIS — Z85828 Personal history of other malignant neoplasm of skin: Secondary | ICD-10-CM | POA: Diagnosis not present

## 2022-12-03 DIAGNOSIS — Z1283 Encounter for screening for malignant neoplasm of skin: Secondary | ICD-10-CM | POA: Diagnosis not present

## 2022-12-03 DIAGNOSIS — D485 Neoplasm of uncertain behavior of skin: Secondary | ICD-10-CM | POA: Diagnosis not present

## 2022-12-03 DIAGNOSIS — Z08 Encounter for follow-up examination after completed treatment for malignant neoplasm: Secondary | ICD-10-CM | POA: Diagnosis not present

## 2022-12-03 DIAGNOSIS — L578 Other skin changes due to chronic exposure to nonionizing radiation: Secondary | ICD-10-CM | POA: Diagnosis not present

## 2022-12-03 DIAGNOSIS — L814 Other melanin hyperpigmentation: Secondary | ICD-10-CM | POA: Diagnosis not present

## 2022-12-03 DIAGNOSIS — L821 Other seborrheic keratosis: Secondary | ICD-10-CM | POA: Diagnosis not present

## 2022-12-04 DIAGNOSIS — F1120 Opioid dependence, uncomplicated: Secondary | ICD-10-CM | POA: Diagnosis not present

## 2022-12-04 DIAGNOSIS — M62838 Other muscle spasm: Secondary | ICD-10-CM | POA: Diagnosis not present

## 2022-12-04 DIAGNOSIS — M5136 Other intervertebral disc degeneration, lumbar region: Secondary | ICD-10-CM | POA: Diagnosis not present

## 2022-12-08 DIAGNOSIS — M25562 Pain in left knee: Secondary | ICD-10-CM | POA: Diagnosis not present

## 2022-12-08 DIAGNOSIS — M62838 Other muscle spasm: Secondary | ICD-10-CM | POA: Diagnosis not present

## 2023-01-08 DIAGNOSIS — M62838 Other muscle spasm: Secondary | ICD-10-CM | POA: Diagnosis not present

## 2023-01-08 DIAGNOSIS — M25562 Pain in left knee: Secondary | ICD-10-CM | POA: Diagnosis not present

## 2023-01-14 DIAGNOSIS — Z7901 Long term (current) use of anticoagulants: Secondary | ICD-10-CM | POA: Diagnosis not present

## 2023-01-14 DIAGNOSIS — I25119 Atherosclerotic heart disease of native coronary artery with unspecified angina pectoris: Secondary | ICD-10-CM | POA: Diagnosis not present

## 2023-01-14 DIAGNOSIS — I480 Paroxysmal atrial fibrillation: Secondary | ICD-10-CM | POA: Diagnosis not present

## 2023-01-14 DIAGNOSIS — Z9889 Other specified postprocedural states: Secondary | ICD-10-CM | POA: Diagnosis not present

## 2023-01-18 DIAGNOSIS — M25562 Pain in left knee: Secondary | ICD-10-CM | POA: Diagnosis not present

## 2023-01-18 DIAGNOSIS — M62838 Other muscle spasm: Secondary | ICD-10-CM | POA: Diagnosis not present

## 2023-01-26 DIAGNOSIS — I10 Essential (primary) hypertension: Secondary | ICD-10-CM | POA: Diagnosis not present

## 2023-01-26 DIAGNOSIS — G629 Polyneuropathy, unspecified: Secondary | ICD-10-CM | POA: Diagnosis not present

## 2023-01-26 DIAGNOSIS — E782 Mixed hyperlipidemia: Secondary | ICD-10-CM | POA: Diagnosis not present

## 2023-01-26 DIAGNOSIS — E039 Hypothyroidism, unspecified: Secondary | ICD-10-CM | POA: Diagnosis not present

## 2023-01-26 DIAGNOSIS — Z79899 Other long term (current) drug therapy: Secondary | ICD-10-CM | POA: Diagnosis not present

## 2023-02-17 DIAGNOSIS — M25562 Pain in left knee: Secondary | ICD-10-CM | POA: Diagnosis not present

## 2023-02-17 DIAGNOSIS — M62838 Other muscle spasm: Secondary | ICD-10-CM | POA: Diagnosis not present

## 2023-02-20 DIAGNOSIS — R7303 Prediabetes: Secondary | ICD-10-CM | POA: Diagnosis not present

## 2023-02-20 DIAGNOSIS — E039 Hypothyroidism, unspecified: Secondary | ICD-10-CM | POA: Diagnosis not present

## 2023-02-20 DIAGNOSIS — R972 Elevated prostate specific antigen [PSA]: Secondary | ICD-10-CM | POA: Diagnosis not present

## 2023-02-20 DIAGNOSIS — I10 Essential (primary) hypertension: Secondary | ICD-10-CM | POA: Diagnosis not present

## 2023-02-20 DIAGNOSIS — Z131 Encounter for screening for diabetes mellitus: Secondary | ICD-10-CM | POA: Diagnosis not present

## 2023-02-20 DIAGNOSIS — E782 Mixed hyperlipidemia: Secondary | ICD-10-CM | POA: Diagnosis not present

## 2023-03-06 DIAGNOSIS — M792 Neuralgia and neuritis, unspecified: Secondary | ICD-10-CM | POA: Diagnosis not present

## 2023-03-06 DIAGNOSIS — M51362 Other intervertebral disc degeneration, lumbar region with discogenic back pain and lower extremity pain: Secondary | ICD-10-CM | POA: Diagnosis not present

## 2023-03-20 DIAGNOSIS — M25562 Pain in left knee: Secondary | ICD-10-CM | POA: Diagnosis not present

## 2023-03-20 DIAGNOSIS — M62838 Other muscle spasm: Secondary | ICD-10-CM | POA: Diagnosis not present

## 2023-04-09 DIAGNOSIS — Z713 Dietary counseling and surveillance: Secondary | ICD-10-CM | POA: Diagnosis not present

## 2023-04-09 DIAGNOSIS — Z7182 Exercise counseling: Secondary | ICD-10-CM | POA: Diagnosis not present

## 2023-04-09 DIAGNOSIS — Z6833 Body mass index (BMI) 33.0-33.9, adult: Secondary | ICD-10-CM | POA: Diagnosis not present

## 2023-04-09 DIAGNOSIS — Z79899 Other long term (current) drug therapy: Secondary | ICD-10-CM | POA: Diagnosis not present

## 2023-04-09 DIAGNOSIS — Z87891 Personal history of nicotine dependence: Secondary | ICD-10-CM | POA: Diagnosis not present

## 2023-04-09 DIAGNOSIS — E782 Mixed hyperlipidemia: Secondary | ICD-10-CM | POA: Diagnosis not present

## 2023-04-09 DIAGNOSIS — I10 Essential (primary) hypertension: Secondary | ICD-10-CM | POA: Diagnosis not present

## 2023-04-09 DIAGNOSIS — G629 Polyneuropathy, unspecified: Secondary | ICD-10-CM | POA: Diagnosis not present

## 2023-04-09 DIAGNOSIS — Z79890 Hormone replacement therapy (postmenopausal): Secondary | ICD-10-CM | POA: Diagnosis not present

## 2023-04-09 DIAGNOSIS — E039 Hypothyroidism, unspecified: Secondary | ICD-10-CM | POA: Diagnosis not present

## 2023-04-19 DIAGNOSIS — M62838 Other muscle spasm: Secondary | ICD-10-CM | POA: Diagnosis not present

## 2023-04-19 DIAGNOSIS — M25562 Pain in left knee: Secondary | ICD-10-CM | POA: Diagnosis not present

## 2023-04-28 DIAGNOSIS — J019 Acute sinusitis, unspecified: Secondary | ICD-10-CM | POA: Diagnosis not present

## 2023-04-28 DIAGNOSIS — Z87891 Personal history of nicotine dependence: Secondary | ICD-10-CM | POA: Diagnosis not present

## 2023-06-05 DIAGNOSIS — M792 Neuralgia and neuritis, unspecified: Secondary | ICD-10-CM | POA: Diagnosis not present

## 2023-06-05 DIAGNOSIS — M51362 Other intervertebral disc degeneration, lumbar region with discogenic back pain and lower extremity pain: Secondary | ICD-10-CM | POA: Diagnosis not present

## 2023-06-05 DIAGNOSIS — F1120 Opioid dependence, uncomplicated: Secondary | ICD-10-CM | POA: Diagnosis not present

## 2023-06-20 DIAGNOSIS — M25562 Pain in left knee: Secondary | ICD-10-CM | POA: Diagnosis not present

## 2023-06-20 DIAGNOSIS — M62838 Other muscle spasm: Secondary | ICD-10-CM | POA: Diagnosis not present

## 2023-06-24 DIAGNOSIS — M9903 Segmental and somatic dysfunction of lumbar region: Secondary | ICD-10-CM | POA: Diagnosis not present

## 2023-06-24 DIAGNOSIS — M9901 Segmental and somatic dysfunction of cervical region: Secondary | ICD-10-CM | POA: Diagnosis not present

## 2023-06-24 DIAGNOSIS — M542 Cervicalgia: Secondary | ICD-10-CM | POA: Diagnosis not present

## 2023-06-24 DIAGNOSIS — M62830 Muscle spasm of back: Secondary | ICD-10-CM | POA: Diagnosis not present

## 2023-06-24 DIAGNOSIS — M9902 Segmental and somatic dysfunction of thoracic region: Secondary | ICD-10-CM | POA: Diagnosis not present

## 2023-06-24 DIAGNOSIS — M546 Pain in thoracic spine: Secondary | ICD-10-CM | POA: Diagnosis not present

## 2023-06-26 DIAGNOSIS — M62830 Muscle spasm of back: Secondary | ICD-10-CM | POA: Diagnosis not present

## 2023-06-26 DIAGNOSIS — M542 Cervicalgia: Secondary | ICD-10-CM | POA: Diagnosis not present

## 2023-06-26 DIAGNOSIS — M546 Pain in thoracic spine: Secondary | ICD-10-CM | POA: Diagnosis not present

## 2023-06-26 DIAGNOSIS — M9903 Segmental and somatic dysfunction of lumbar region: Secondary | ICD-10-CM | POA: Diagnosis not present

## 2023-06-26 DIAGNOSIS — M9902 Segmental and somatic dysfunction of thoracic region: Secondary | ICD-10-CM | POA: Diagnosis not present

## 2023-06-26 DIAGNOSIS — M9901 Segmental and somatic dysfunction of cervical region: Secondary | ICD-10-CM | POA: Diagnosis not present

## 2023-06-29 DIAGNOSIS — M9903 Segmental and somatic dysfunction of lumbar region: Secondary | ICD-10-CM | POA: Diagnosis not present

## 2023-06-29 DIAGNOSIS — M546 Pain in thoracic spine: Secondary | ICD-10-CM | POA: Diagnosis not present

## 2023-06-29 DIAGNOSIS — M62830 Muscle spasm of back: Secondary | ICD-10-CM | POA: Diagnosis not present

## 2023-06-29 DIAGNOSIS — M9902 Segmental and somatic dysfunction of thoracic region: Secondary | ICD-10-CM | POA: Diagnosis not present

## 2023-06-29 DIAGNOSIS — M542 Cervicalgia: Secondary | ICD-10-CM | POA: Diagnosis not present

## 2023-06-29 DIAGNOSIS — M9901 Segmental and somatic dysfunction of cervical region: Secondary | ICD-10-CM | POA: Diagnosis not present

## 2023-06-30 DIAGNOSIS — I480 Paroxysmal atrial fibrillation: Secondary | ICD-10-CM | POA: Diagnosis not present

## 2023-06-30 DIAGNOSIS — R0609 Other forms of dyspnea: Secondary | ICD-10-CM | POA: Diagnosis not present

## 2023-06-30 DIAGNOSIS — I25119 Atherosclerotic heart disease of native coronary artery with unspecified angina pectoris: Secondary | ICD-10-CM | POA: Diagnosis not present

## 2023-06-30 DIAGNOSIS — Z7901 Long term (current) use of anticoagulants: Secondary | ICD-10-CM | POA: Diagnosis not present

## 2023-06-30 DIAGNOSIS — I5032 Chronic diastolic (congestive) heart failure: Secondary | ICD-10-CM | POA: Diagnosis not present

## 2023-07-03 DIAGNOSIS — E039 Hypothyroidism, unspecified: Secondary | ICD-10-CM | POA: Diagnosis not present

## 2023-07-03 DIAGNOSIS — M9901 Segmental and somatic dysfunction of cervical region: Secondary | ICD-10-CM | POA: Diagnosis not present

## 2023-07-03 DIAGNOSIS — M542 Cervicalgia: Secondary | ICD-10-CM | POA: Diagnosis not present

## 2023-07-03 DIAGNOSIS — R972 Elevated prostate specific antigen [PSA]: Secondary | ICD-10-CM | POA: Diagnosis not present

## 2023-07-03 DIAGNOSIS — M9903 Segmental and somatic dysfunction of lumbar region: Secondary | ICD-10-CM | POA: Diagnosis not present

## 2023-07-03 DIAGNOSIS — R0600 Dyspnea, unspecified: Secondary | ICD-10-CM | POA: Diagnosis not present

## 2023-07-03 DIAGNOSIS — I10 Essential (primary) hypertension: Secondary | ICD-10-CM | POA: Diagnosis not present

## 2023-07-03 DIAGNOSIS — M62830 Muscle spasm of back: Secondary | ICD-10-CM | POA: Diagnosis not present

## 2023-07-03 DIAGNOSIS — M546 Pain in thoracic spine: Secondary | ICD-10-CM | POA: Diagnosis not present

## 2023-07-03 DIAGNOSIS — Z131 Encounter for screening for diabetes mellitus: Secondary | ICD-10-CM | POA: Diagnosis not present

## 2023-07-03 DIAGNOSIS — R7303 Prediabetes: Secondary | ICD-10-CM | POA: Diagnosis not present

## 2023-07-03 DIAGNOSIS — E782 Mixed hyperlipidemia: Secondary | ICD-10-CM | POA: Diagnosis not present

## 2023-07-03 DIAGNOSIS — M9902 Segmental and somatic dysfunction of thoracic region: Secondary | ICD-10-CM | POA: Diagnosis not present

## 2023-07-07 DIAGNOSIS — M62830 Muscle spasm of back: Secondary | ICD-10-CM | POA: Diagnosis not present

## 2023-07-07 DIAGNOSIS — M9901 Segmental and somatic dysfunction of cervical region: Secondary | ICD-10-CM | POA: Diagnosis not present

## 2023-07-07 DIAGNOSIS — M546 Pain in thoracic spine: Secondary | ICD-10-CM | POA: Diagnosis not present

## 2023-07-07 DIAGNOSIS — M542 Cervicalgia: Secondary | ICD-10-CM | POA: Diagnosis not present

## 2023-07-07 DIAGNOSIS — M9902 Segmental and somatic dysfunction of thoracic region: Secondary | ICD-10-CM | POA: Diagnosis not present

## 2023-07-07 DIAGNOSIS — M9903 Segmental and somatic dysfunction of lumbar region: Secondary | ICD-10-CM | POA: Diagnosis not present

## 2023-07-08 DIAGNOSIS — L578 Other skin changes due to chronic exposure to nonionizing radiation: Secondary | ICD-10-CM | POA: Diagnosis not present

## 2023-07-08 DIAGNOSIS — C4441 Basal cell carcinoma of skin of scalp and neck: Secondary | ICD-10-CM | POA: Diagnosis not present

## 2023-07-08 DIAGNOSIS — D485 Neoplasm of uncertain behavior of skin: Secondary | ICD-10-CM | POA: Diagnosis not present

## 2023-07-08 DIAGNOSIS — L821 Other seborrheic keratosis: Secondary | ICD-10-CM | POA: Diagnosis not present

## 2023-07-08 DIAGNOSIS — L820 Inflamed seborrheic keratosis: Secondary | ICD-10-CM | POA: Diagnosis not present

## 2023-07-15 DIAGNOSIS — M546 Pain in thoracic spine: Secondary | ICD-10-CM | POA: Diagnosis not present

## 2023-07-15 DIAGNOSIS — M542 Cervicalgia: Secondary | ICD-10-CM | POA: Diagnosis not present

## 2023-07-15 DIAGNOSIS — M9902 Segmental and somatic dysfunction of thoracic region: Secondary | ICD-10-CM | POA: Diagnosis not present

## 2023-07-15 DIAGNOSIS — M9901 Segmental and somatic dysfunction of cervical region: Secondary | ICD-10-CM | POA: Diagnosis not present

## 2023-07-15 DIAGNOSIS — M9903 Segmental and somatic dysfunction of lumbar region: Secondary | ICD-10-CM | POA: Diagnosis not present

## 2023-07-15 DIAGNOSIS — M62830 Muscle spasm of back: Secondary | ICD-10-CM | POA: Diagnosis not present

## 2023-07-18 DIAGNOSIS — M62838 Other muscle spasm: Secondary | ICD-10-CM | POA: Diagnosis not present

## 2023-07-18 DIAGNOSIS — M25562 Pain in left knee: Secondary | ICD-10-CM | POA: Diagnosis not present

## 2023-07-24 DIAGNOSIS — I10 Essential (primary) hypertension: Secondary | ICD-10-CM | POA: Diagnosis not present

## 2023-07-24 DIAGNOSIS — I110 Hypertensive heart disease with heart failure: Secondary | ICD-10-CM | POA: Diagnosis not present

## 2023-07-24 DIAGNOSIS — M9901 Segmental and somatic dysfunction of cervical region: Secondary | ICD-10-CM | POA: Diagnosis not present

## 2023-07-24 DIAGNOSIS — M542 Cervicalgia: Secondary | ICD-10-CM | POA: Diagnosis not present

## 2023-07-24 DIAGNOSIS — I714 Abdominal aortic aneurysm, without rupture, unspecified: Secondary | ICD-10-CM | POA: Diagnosis not present

## 2023-07-24 DIAGNOSIS — E782 Mixed hyperlipidemia: Secondary | ICD-10-CM | POA: Diagnosis not present

## 2023-07-24 DIAGNOSIS — I700 Atherosclerosis of aorta: Secondary | ICD-10-CM | POA: Diagnosis not present

## 2023-07-24 DIAGNOSIS — M62830 Muscle spasm of back: Secondary | ICD-10-CM | POA: Diagnosis not present

## 2023-07-24 DIAGNOSIS — G629 Polyneuropathy, unspecified: Secondary | ICD-10-CM | POA: Diagnosis not present

## 2023-07-24 DIAGNOSIS — I2510 Atherosclerotic heart disease of native coronary artery without angina pectoris: Secondary | ICD-10-CM | POA: Diagnosis not present

## 2023-07-24 DIAGNOSIS — R809 Proteinuria, unspecified: Secondary | ICD-10-CM | POA: Diagnosis not present

## 2023-07-24 DIAGNOSIS — M9903 Segmental and somatic dysfunction of lumbar region: Secondary | ICD-10-CM | POA: Diagnosis not present

## 2023-07-24 DIAGNOSIS — M546 Pain in thoracic spine: Secondary | ICD-10-CM | POA: Diagnosis not present

## 2023-07-24 DIAGNOSIS — I482 Chronic atrial fibrillation, unspecified: Secondary | ICD-10-CM | POA: Diagnosis not present

## 2023-07-24 DIAGNOSIS — D696 Thrombocytopenia, unspecified: Secondary | ICD-10-CM | POA: Diagnosis not present

## 2023-07-24 DIAGNOSIS — M9902 Segmental and somatic dysfunction of thoracic region: Secondary | ICD-10-CM | POA: Diagnosis not present

## 2023-07-24 DIAGNOSIS — R7303 Prediabetes: Secondary | ICD-10-CM | POA: Diagnosis not present

## 2023-07-24 DIAGNOSIS — E039 Hypothyroidism, unspecified: Secondary | ICD-10-CM | POA: Diagnosis not present

## 2023-08-04 DIAGNOSIS — C4441 Basal cell carcinoma of skin of scalp and neck: Secondary | ICD-10-CM | POA: Diagnosis not present

## 2023-08-18 DIAGNOSIS — M25562 Pain in left knee: Secondary | ICD-10-CM | POA: Diagnosis not present

## 2023-08-18 DIAGNOSIS — M62838 Other muscle spasm: Secondary | ICD-10-CM | POA: Diagnosis not present

## 2023-08-21 DIAGNOSIS — I700 Atherosclerosis of aorta: Secondary | ICD-10-CM | POA: Diagnosis not present

## 2023-08-21 DIAGNOSIS — I259 Chronic ischemic heart disease, unspecified: Secondary | ICD-10-CM | POA: Diagnosis not present

## 2023-08-21 DIAGNOSIS — E785 Hyperlipidemia, unspecified: Secondary | ICD-10-CM | POA: Diagnosis not present

## 2023-08-21 DIAGNOSIS — R0609 Other forms of dyspnea: Secondary | ICD-10-CM | POA: Diagnosis not present

## 2023-08-21 DIAGNOSIS — I5032 Chronic diastolic (congestive) heart failure: Secondary | ICD-10-CM | POA: Diagnosis not present

## 2023-08-21 DIAGNOSIS — Z955 Presence of coronary angioplasty implant and graft: Secondary | ICD-10-CM | POA: Diagnosis not present

## 2023-08-21 DIAGNOSIS — I4729 Other ventricular tachycardia: Secondary | ICD-10-CM | POA: Diagnosis not present

## 2023-08-21 DIAGNOSIS — I2510 Atherosclerotic heart disease of native coronary artery without angina pectoris: Secondary | ICD-10-CM | POA: Diagnosis not present

## 2023-08-21 DIAGNOSIS — I25119 Atherosclerotic heart disease of native coronary artery with unspecified angina pectoris: Secondary | ICD-10-CM | POA: Diagnosis not present

## 2023-08-21 DIAGNOSIS — I083 Combined rheumatic disorders of mitral, aortic and tricuspid valves: Secondary | ICD-10-CM | POA: Diagnosis not present

## 2023-08-21 DIAGNOSIS — I10 Essential (primary) hypertension: Secondary | ICD-10-CM | POA: Diagnosis not present

## 2023-08-21 DIAGNOSIS — R9439 Abnormal result of other cardiovascular function study: Secondary | ICD-10-CM | POA: Diagnosis not present

## 2023-09-02 DIAGNOSIS — M51362 Other intervertebral disc degeneration, lumbar region with discogenic back pain and lower extremity pain: Secondary | ICD-10-CM | POA: Diagnosis not present

## 2023-09-02 DIAGNOSIS — F1120 Opioid dependence, uncomplicated: Secondary | ICD-10-CM | POA: Diagnosis not present

## 2023-09-02 DIAGNOSIS — M792 Neuralgia and neuritis, unspecified: Secondary | ICD-10-CM | POA: Diagnosis not present

## 2023-09-04 DIAGNOSIS — E039 Hypothyroidism, unspecified: Secondary | ICD-10-CM | POA: Diagnosis not present

## 2023-09-04 DIAGNOSIS — I10 Essential (primary) hypertension: Secondary | ICD-10-CM | POA: Diagnosis not present

## 2023-09-04 DIAGNOSIS — G629 Polyneuropathy, unspecified: Secondary | ICD-10-CM | POA: Diagnosis not present

## 2023-09-04 DIAGNOSIS — I2510 Atherosclerotic heart disease of native coronary artery without angina pectoris: Secondary | ICD-10-CM | POA: Diagnosis not present

## 2023-09-17 DIAGNOSIS — M25562 Pain in left knee: Secondary | ICD-10-CM | POA: Diagnosis not present

## 2023-09-17 DIAGNOSIS — M62838 Other muscle spasm: Secondary | ICD-10-CM | POA: Diagnosis not present

## 2023-10-18 DIAGNOSIS — M62838 Other muscle spasm: Secondary | ICD-10-CM | POA: Diagnosis not present

## 2023-10-18 DIAGNOSIS — M25562 Pain in left knee: Secondary | ICD-10-CM | POA: Diagnosis not present

## 2023-10-28 DIAGNOSIS — I5030 Unspecified diastolic (congestive) heart failure: Secondary | ICD-10-CM | POA: Diagnosis not present

## 2023-10-28 DIAGNOSIS — I719 Aortic aneurysm of unspecified site, without rupture: Secondary | ICD-10-CM | POA: Diagnosis not present

## 2023-10-28 DIAGNOSIS — Z7901 Long term (current) use of anticoagulants: Secondary | ICD-10-CM | POA: Diagnosis not present

## 2023-10-28 DIAGNOSIS — I4819 Other persistent atrial fibrillation: Secondary | ICD-10-CM | POA: Diagnosis not present

## 2023-11-05 ENCOUNTER — Other Ambulatory Visit (HOSPITAL_COMMUNITY): Payer: Self-pay | Admitting: Internal Medicine

## 2023-11-05 DIAGNOSIS — R42 Dizziness and giddiness: Secondary | ICD-10-CM

## 2023-11-05 DIAGNOSIS — I482 Chronic atrial fibrillation, unspecified: Secondary | ICD-10-CM | POA: Diagnosis not present

## 2023-11-05 DIAGNOSIS — I10 Essential (primary) hypertension: Secondary | ICD-10-CM | POA: Diagnosis not present

## 2023-11-05 DIAGNOSIS — I714 Abdominal aortic aneurysm, without rupture, unspecified: Secondary | ICD-10-CM | POA: Diagnosis not present

## 2023-11-05 DIAGNOSIS — R0600 Dyspnea, unspecified: Secondary | ICD-10-CM | POA: Diagnosis not present

## 2023-11-05 DIAGNOSIS — I2510 Atherosclerotic heart disease of native coronary artery without angina pectoris: Secondary | ICD-10-CM | POA: Diagnosis not present

## 2023-11-05 DIAGNOSIS — E039 Hypothyroidism, unspecified: Secondary | ICD-10-CM | POA: Diagnosis not present

## 2023-11-05 DIAGNOSIS — G629 Polyneuropathy, unspecified: Secondary | ICD-10-CM | POA: Diagnosis not present

## 2023-11-12 DIAGNOSIS — I4891 Unspecified atrial fibrillation: Secondary | ICD-10-CM | POA: Diagnosis not present

## 2023-11-12 DIAGNOSIS — Z7901 Long term (current) use of anticoagulants: Secondary | ICD-10-CM | POA: Diagnosis not present

## 2023-11-12 DIAGNOSIS — R55 Syncope and collapse: Secondary | ICD-10-CM | POA: Diagnosis not present

## 2023-11-12 DIAGNOSIS — R0789 Other chest pain: Secondary | ICD-10-CM | POA: Diagnosis not present

## 2023-11-12 DIAGNOSIS — I25119 Atherosclerotic heart disease of native coronary artery with unspecified angina pectoris: Secondary | ICD-10-CM | POA: Diagnosis not present

## 2023-11-12 DIAGNOSIS — R42 Dizziness and giddiness: Secondary | ICD-10-CM | POA: Diagnosis not present

## 2023-11-12 DIAGNOSIS — I10 Essential (primary) hypertension: Secondary | ICD-10-CM | POA: Diagnosis not present

## 2023-11-12 DIAGNOSIS — Z5321 Procedure and treatment not carried out due to patient leaving prior to being seen by health care provider: Secondary | ICD-10-CM | POA: Diagnosis not present

## 2023-11-12 DIAGNOSIS — I4811 Longstanding persistent atrial fibrillation: Secondary | ICD-10-CM | POA: Diagnosis not present

## 2023-11-12 DIAGNOSIS — R9431 Abnormal electrocardiogram [ECG] [EKG]: Secondary | ICD-10-CM | POA: Diagnosis not present

## 2023-11-12 DIAGNOSIS — I5032 Chronic diastolic (congestive) heart failure: Secondary | ICD-10-CM | POA: Diagnosis not present

## 2023-11-12 DIAGNOSIS — I493 Ventricular premature depolarization: Secondary | ICD-10-CM | POA: Diagnosis not present

## 2023-11-13 DIAGNOSIS — I4811 Longstanding persistent atrial fibrillation: Secondary | ICD-10-CM | POA: Diagnosis not present

## 2023-11-17 DIAGNOSIS — M62838 Other muscle spasm: Secondary | ICD-10-CM | POA: Diagnosis not present

## 2023-11-17 DIAGNOSIS — M25562 Pain in left knee: Secondary | ICD-10-CM | POA: Diagnosis not present

## 2023-11-24 DIAGNOSIS — Z9889 Other specified postprocedural states: Secondary | ICD-10-CM | POA: Diagnosis not present

## 2023-11-24 DIAGNOSIS — I10 Essential (primary) hypertension: Secondary | ICD-10-CM | POA: Diagnosis not present

## 2023-11-24 DIAGNOSIS — E782 Mixed hyperlipidemia: Secondary | ICD-10-CM | POA: Diagnosis not present

## 2023-11-24 DIAGNOSIS — I2510 Atherosclerotic heart disease of native coronary artery without angina pectoris: Secondary | ICD-10-CM | POA: Diagnosis not present

## 2023-11-24 DIAGNOSIS — I482 Chronic atrial fibrillation, unspecified: Secondary | ICD-10-CM | POA: Diagnosis not present

## 2023-11-24 DIAGNOSIS — Z87891 Personal history of nicotine dependence: Secondary | ICD-10-CM | POA: Diagnosis not present

## 2023-11-24 DIAGNOSIS — I714 Abdominal aortic aneurysm, without rupture, unspecified: Secondary | ICD-10-CM | POA: Diagnosis not present

## 2023-11-24 DIAGNOSIS — G629 Polyneuropathy, unspecified: Secondary | ICD-10-CM | POA: Diagnosis not present

## 2023-11-24 DIAGNOSIS — R55 Syncope and collapse: Secondary | ICD-10-CM | POA: Diagnosis not present

## 2023-11-24 DIAGNOSIS — R42 Dizziness and giddiness: Secondary | ICD-10-CM | POA: Diagnosis not present

## 2023-11-24 DIAGNOSIS — E039 Hypothyroidism, unspecified: Secondary | ICD-10-CM | POA: Diagnosis not present

## 2023-11-25 DIAGNOSIS — I4811 Longstanding persistent atrial fibrillation: Secondary | ICD-10-CM | POA: Diagnosis not present

## 2023-12-03 DIAGNOSIS — R55 Syncope and collapse: Secondary | ICD-10-CM | POA: Diagnosis not present

## 2023-12-04 ENCOUNTER — Encounter (HOSPITAL_COMMUNITY): Payer: Self-pay

## 2023-12-04 ENCOUNTER — Other Ambulatory Visit (HOSPITAL_COMMUNITY)

## 2023-12-07 DIAGNOSIS — M51362 Other intervertebral disc degeneration, lumbar region with discogenic back pain and lower extremity pain: Secondary | ICD-10-CM | POA: Diagnosis not present

## 2023-12-07 DIAGNOSIS — M792 Neuralgia and neuritis, unspecified: Secondary | ICD-10-CM | POA: Diagnosis not present

## 2023-12-07 DIAGNOSIS — F1120 Opioid dependence, uncomplicated: Secondary | ICD-10-CM | POA: Diagnosis not present

## 2023-12-15 DIAGNOSIS — I25119 Atherosclerotic heart disease of native coronary artery with unspecified angina pectoris: Secondary | ICD-10-CM | POA: Diagnosis not present

## 2023-12-15 DIAGNOSIS — G4733 Obstructive sleep apnea (adult) (pediatric): Secondary | ICD-10-CM | POA: Diagnosis not present

## 2023-12-15 DIAGNOSIS — I480 Paroxysmal atrial fibrillation: Secondary | ICD-10-CM | POA: Diagnosis not present

## 2023-12-15 DIAGNOSIS — I714 Abdominal aortic aneurysm, without rupture, unspecified: Secondary | ICD-10-CM | POA: Diagnosis not present

## 2023-12-15 DIAGNOSIS — I5032 Chronic diastolic (congestive) heart failure: Secondary | ICD-10-CM | POA: Diagnosis not present

## 2023-12-18 DIAGNOSIS — M62838 Other muscle spasm: Secondary | ICD-10-CM | POA: Diagnosis not present

## 2023-12-18 DIAGNOSIS — M25562 Pain in left knee: Secondary | ICD-10-CM | POA: Diagnosis not present

## 2023-12-21 DIAGNOSIS — I25119 Atherosclerotic heart disease of native coronary artery with unspecified angina pectoris: Secondary | ICD-10-CM | POA: Diagnosis not present

## 2023-12-21 DIAGNOSIS — G4733 Obstructive sleep apnea (adult) (pediatric): Secondary | ICD-10-CM | POA: Diagnosis not present

## 2023-12-21 DIAGNOSIS — I4819 Other persistent atrial fibrillation: Secondary | ICD-10-CM | POA: Diagnosis not present

## 2023-12-21 DIAGNOSIS — I480 Paroxysmal atrial fibrillation: Secondary | ICD-10-CM | POA: Diagnosis not present

## 2023-12-21 DIAGNOSIS — I714 Abdominal aortic aneurysm, without rupture, unspecified: Secondary | ICD-10-CM | POA: Diagnosis not present

## 2023-12-21 DIAGNOSIS — I2510 Atherosclerotic heart disease of native coronary artery without angina pectoris: Secondary | ICD-10-CM | POA: Diagnosis not present

## 2023-12-21 NOTE — Procedures (Signed)
  [facility] Procedure Note   Procedure:  Intracardiac catheter ablation AFIB including Transseptal Cath (CPT 93656) Additional linear/focal atrial ablation (CPT [phone]) Additional atrial ablation of discrete mechanism (CPT [phone])  Attending:  Daria Repress, MD  Assistant(s): Arlina Andrews, MD   AF type: persistent atrial fibrillation  Presenting Rhythm: atrial fibrillation   Complications: none  EBL: minimal  Anesthesia: General  Access: - RFV 8.5 Fr D1 Versacross -> Exchanged for 8.5 Fr Agilis - RFV: 9 Fr 35 cm Brite tip  Catheters - 8 Fr AccuSon ICE - Sphere 9 Ablation catheter - Inquiry decapolar catheter  Comments: - Femoral venous access performed with ultrasound guidance - Following access heparin  administered to achieve an ACT of 375 + 50 s throughout the case - Initial catheter placement assisted with a combination of fluoroscopic imaging,  intracardiac echocardiography and 3D anatomic localization with AFFERA - Uncomplicated transseptal puncture with [place]i system with intracardiac echocardiographic and fluoroscopic guidance - DCCV x1 (360J) to normal sinus rhythm  - Sphere 9 catheter inserted through the Agilis catheter into the LA, high density electroanatomic mapping of the LA created with Prism  (AFFERA) - Initial map was notable for reconnection of the left superior vein and the posterior wall, the remaining veins demonstrated durable block from prior ablations - During mapping the patient briefly went into atrial flutter with a cycle length of 220 msec with CS activation pattern (reverse chevron pattern) suggestive of a roof dependent flutter - Ablation lesions were created along the roof, the floor and WACA around the left pulmonary veins with successful isolation of the left superior vein and posterior wall - No additional ablation required for isolation after ablation for  pulmonary vein isolation and posterior wall isolation. -  Afterwards underwent DCCV x1 (360J) with successful conversion to normal sinus rhythm - Catheters removed from the heart and heparin  effect reversed with protamine  - Sheaths removed with hemostasis obtained by Mynx closure devices  Total PFA lesions: 42  Summary: atrial fibrillation on arrival   Successful transseptal puncture x 1 with ICE guidance  3-D mapping of the LA and PV's Successful reisolation of left superior vein and posterior wall DCCV to normal sinus rhythm at the end of the procedure  Plan: - Bedrest x 2 hours - Restart apixaban  tonight as scheduled - Anti-arrhythmic: none - Discharge Plan: today - EP f/u to be scheduled   Arlina MICAEL Andrews, MD Cardiology Fellow PGY-6

## 2023-12-24 NOTE — Progress Notes (Addendum)
 Clinical Heart Failure Pharmacist Note:  Reason for the call: GDMT Titration  HPI   Subjective:   History of Present Illness Wayne Ortiz is a 78 y.o. male with PMH of PAFib (Hx multiple DCCV/ablations, chronic anticoagulation, on apixaban ), CAD (05/2014 RCA & LCX PCI), NSVT, SVT, COVID-19 01/2019, hx of TIA (s/p Lt CEA at [facility]), HTN, dyslipidemia, and SCC resection of the lip and chronic heart failure with preserved ejection fraction (07/2023 EF > 55%) secondary to ischemic cardiomyopathy. Present today for GDMT evaluation and titration with referral from [facility], NP/Dr. Antonieta.   Medication Titration Clinic History: Visit 1: 05/28 - Start Farxiga 10 mg daily  Visit 2: 06/11 - Start Farxiga 10 mg daily and resume tamsulosin  as prescribed by PCP  Visit 3: 06/30- Start Farxiga Visit 4: 07/24- Start Spironolactone 25mg  daily Visit 5: 08/12- Start Farxiga 10 mg daily, decrease furosemide   Visit 6: 08/29- No changes, graduate GDMT clinic  Since Wayne Ortiz last visit, he has been feeling pretty good. He recently had an AF ablation procedure which went well. His labs are stable since starting spironolactone. He notes that he started duloxetine for nerve pain after his ablation procedure and experienced a fever so he stopped taking and it has since resolved. He has started taking Farxiga which he received via mail through patient assistance. He is very Adult nurse of the pharmacy team assisting him with medication costs for Farxiga ([employer] PAP) and Eliquis  ([employer]).   He denies dizziness/lightheadedness. The feeling of his head swimming is much improved since his ablation, but his eyes sometimes feel weird. He admits he has not been staying hydrated as much as he should be, but he tries to drink 64 ounces of fluid per day. His BP yesterday was 139/87 with a HR of 76 bpm. He denies shortness of breath with walking. He denies swelling in his feet or ankles. He has reduced his  furosemide  to 40 mg once daily and 40 mg as needed for weight > 232 lbs following initiation of Farxiga. He had weight gain up to 236 lbs but took the as needed dose of furosemide  and his weight returned to 231 lbs. He denies chest pain and denies palpitations.   He is currently taking metoprolol  succinate 25 mg BID (AF rate control/CAD), Eliquis  5 mg BID (AF), spironolactone 25 mg daily (target), and Farxiga 10 mg daily (target) as prescribed. He is taking furosemide  40 mg daily/40 mg PRN. He has been enrolled in [employer] and [employer] patient assistance which has reduced the cost of Eliquis  and Farxiga to $0. He is not currently on a RAASi due to history of chest pain and SOB with lisinopril . His daughter describes the reaction as the medicine turning on him. He experienced increased fluid retention and did not get out of bed for several days, as it made him feel horrible.   **Patient reports no side effects or concerns with medications, denies dizziness, lightheadedness, fatigue, swelling of the ankles or belly. Denies SBP<90, HR<50. Reports compliance with medications. The cost of medications is affordable for patient. Patient has been taking furosemide  40 mg once daily/40 mg PRN.  Heart Failure Review of Symptoms:   Weight gain: denies Lower extremity edema: denies Chest pain: denies Palpitations: denies Dizziness:denies NYHA Functional Class: II  History of Present Illness Wayne Ortiz is a 78 year old male who presents for management of heart medications.  He recently underwent a cardiac ablation procedure and feels generally well post-procedure. He experiences occasional visual  disturbances and some fatigue. He has started taking Farxiga and Eliquis , both of which he received at no cost.  He monitors his blood pressure at home, noting a recent reading of 139/87 mmHg, which is higher than usual. He recalls a previous reading of 114/64 mmHg during his ablation. He notes that the medication  he stopped, duloxetine, could cause blood pressure to go up. His heart rate was 76 bpm yesterday. No palpitations, chest pain, or shortness of breath. He has not exerted himself much post-procedure but reports walking well without shortness of breath.  He manages nerve pain with Neuropil and hydrocodone , although he states that 'nothing really controls that stuff.' He discontinued duloxetine due to side effects, including fever and excessive urination, and noted resolution of these symptoms after stopping the medication.  He monitors his weight daily, noting a recent increase to 236 lbs, which he managed by taking an additional dose of furosemide , bringing his weight down to 231 lbs. He takes furosemide  40 mg daily with an additional dose as needed. No swelling in his feet or ankles and feels he is not retaining excess fluid currently.  He is taking all his prescribed medications except duloxetine. He receives Farxiga through the mail and picks up Eliquis  from the pharmacy. He confirms he is taking spironolactone, although he plans to double-check at home.  He is trying to maintain hydration by drinking 64 ounces of fluids daily and keeps a water  bottle with him to encourage this habit.   [place] Cardiomyopathy Questionnaire [phone]) Procedure: Shower/Bathing Walking 1 block on level ground Hurrying or jogging (as if to catch a bus) Over the past 2 weeks, how many times did you have swelling in your feet, ankles or legs when you woke up in the morning? Over the past 2 weeks, on average, how many times has fatigue limited your ability to do what you wanted? Over the past 2 weeks, on average, how many times has shortness of breath limited your ability to do what you wanted? Over the past 2 weeks, on average, how many times have you been forced to sleep sitting up in a chair or with at least 3 pillows to prop you up because of shortness of breath?  09/23/2023 12:53 PM 30-Day Follow-Up 3 2 1 2 4 4 1    11/19/2023  1:53 PM 30-Day Follow-Up 3 1 1 5 7 1 5     [place] Cardiomyopathy Questionnaire (KCCQ-12) Over the past 2 weeks, how much has your heart failure limited your enjoyment of life? If you had to spend the rest of your life with your heart failure the way it is right now, how would you feel about this? Hobbies and recreational activities Working or doing household chores Visiting family or friends out of your home Physical Limitation Score Symptom Frequency Score Quality of Life Score  09/23/2023 12:53 PM 3 2 2 2 2 25  * 31.25 * 37.5 *  11/19/2023  1:53 PM 1 3 1 1 2  16.67 * 75 * 25 *    [place] Cardiomyopathy Questionnaire [phone]) Social Limitation Score Summary Score  09/23/2023 12:53 PM 25 * 29.69 *  11/19/2023  1:53 PM 8.33 * 31.25 *    * Patient-reported   CURRENT MEDICATIONS AND ALLERGIES   Current Outpatient Medications  Medication Sig Dispense Refill  . acetaminophen  (TYLENOL ) 500 MG tablet Take 500 mg by mouth every 6 (six) hours as needed for Pain    . allopurinol  (ZYLOPRIM ) 300 MG tablet Take 300  mg by mouth once daily.    . apixaban  (ELIQUIS ) 5 mg tablet Take 1 tablet (5 mg total) by mouth every 12 (twelve) hours 180 tablet 3  . cyanocobalamin (VITAMIN B12) 1000 MCG tablet Take 1 tablet (1,000 mcg total) by mouth once daily    . docusate sodium  (STOOL SOFTENER ORAL) Take by mouth as needed    . FARXIGA 10 mg tablet Take 1 tablet (10 mg total) by mouth once daily (Patient not taking: Reported on 11/19/2023) 30 tablet 11  . FUROsemide  (LASIX ) 40 MG tablet TAKE 1 TABLET BY MOUTH TWICE A DAY 180 tablet 3  . gabapentin  (NEURONTIN ) 300 MG capsule Take 300 mg by mouth 3 (three) times daily    . HYDROcodone -acetaminophen  (NORCO) 10-325 mg tablet Take 1 tablet by mouth 2 (two) times daily May take additional dose as needed    . levothyroxine  (SYNTHROID ) 125 MCG tablet Take 112 mcg by mouth once daily    . metoprolol  SUCCinate (TOPROL -XL) 25 MG XL tablet Take 1 tablet (25  mg total) by mouth 2 (two) times daily 180 tablet 3  . pregabalin (LYRICA) 50 MG capsule Take 50 mg by mouth once daily (Patient not taking: Reported on 11/12/2023)    . spironolactone (ALDACTONE) 25 MG tablet Take 1 tablet (25 mg total) by mouth once daily 30 tablet 11  . tamsulosin  (FLOMAX ) 0.4 mg capsule Take 0.4 mg by mouth every evening     No current facility-administered medications for this visit.    Allergies  Allergen Reactions  . Amiodarone  Shortness Of Breath  . Crestor [Rosuvastatin] Other (See Comments)    Myalgias   . Diltiazem  Rash  . Lipitor [Atorvastatin] Other (See Comments)    Arthralgias  . Lisinopril  Other (See Comments)    CP and SOB  . Metoprolol  Other (See Comments)    CP and SOb  . Ceftriaxone Rash    HF Medication Allergies/Intolerances:  Beta-Blockers: [name]  ACEi/ARB/ARNIs: lisinopril  SOB (not angioedema)  MRAs: [name]  SGLT2is: [name]  Isosorbide dinitrate/Hydralazine : [name]  Loop diuretics: [name]     Adherence:  Patient denies using a pillbox.  Medication management: A caregiver or friend helps patient manage their medications   Patient uses [facility] in [place], [place].    For labs, patient would like to go to PCP.    Medication Cost/Assistance:   Cost evaluation completed for Jardiance on 09/23/23  Patient stated Eliquis  costs $450 for a 90 day supply on 12/08/23 Patient has Medicare  Patient does not qualify for copay cards.  PAP approved for Farxiga through [employer] - $0 Enrolled in [employer] for Eliquis  - $0  Social Drivers of Health with Concerns   Tobacco Use: Medium Risk (12/15/2023)   Patient History   . Smoking Tobacco Use: Former   . Smokeless Tobacco Use: Never  Housing Stability: Unknown (05/21/2023)   Housing Stability Vital Sign   . Homeless in the Last Year: No    DATA/RESULTS     Recent Labs    05/07/21 0817 05/15/22 1032 11/12/23 1524  NA 136 140 143  K  --  4.5 4.2  CL  --  99 105  CO2  --  30 26  BUN  --   18 14  CREATININE  --  1.2 1.2  BUNCRE  --  15 12  GFR  --  62 62  GLUCOSE  --  80 90  MG  --   --  2.3  ALT  --   --  18  AST  --   --  30  TBILI  --   --  1.5  ALB  --   --  4.5        ASSESSMENT DELMA   Impression: Wayne Ortiz is a 78 y.o. male with chronic systolic heart failure who has been referred for medication optimization.  Problem List: 1. HFpEF secondary to ICM - NYHA Class II; AHA / ACC Class C; ?volemic - TTE 08/20/23: EF >55% (TTE 11/2014: EF 55-60%) - NT-proBNP 12/21/23: 1,216 (prev. 708 on 05/15/22)  - iron deficiency screening - Hgb > 15 g/dL on 92/82/74 - no IV iron indicated Lab Results  Component Value Date   FERRITIN 673 (H) 02/27/2019     A/P:  Heart Failure with Preserved Ejection Fraction Symptoms improved on spironolactone and Farxiga. Volume overload adequately managed on recently reduced dose of furosemide . RAASi initiation limited by history of chest pain and shortness of breath with lisinopril .  - Continue Farxiga 10 mg daily (receiving through [employer] PAP) - Continue spironolactone 25 mg daily - Continue furosemide  to 40 mg daily/40 mg PRN  - Monitor BP and HR daily - Ensure adequate hydration - Reach out with any questions or concerns  Atrial Fibrillation Managed with rate control on metoprolol  and stroke prevention on Eliquis . Previous cost concerns related to Eliquis . Recent AF ablation.  - Continue metoprolol  succinate 25 mg BID for rate control - Continue Eliquis  5 mg BID for stroke prevention ([employer] grant)  - GDMT  - ACE/ARB/ARNI: none currently - chest pain and shortness of breath with lisinopril   - BB: Continue metoprolol  succinate 25 mg BID for AF/CAD  - MRA: Continue spironolactone 25 mg daily (target) - SGLT2i: Continue Farxiga 10 mg daily (target, [employer]ME PAP) - Isosorbide dinitrate/Hydralazine : not indicated - Ivabradine not indicated Vericiguat not indicated GLP1 agonist Consider in the future (BMI > 27, hx CAD)  -  Other: Continue Eliquis  5 mg BID for AF ([employer]) - Diuretic: Continue furosemide  40 mg daily/40 mg PRN  - Estimated dry weight: 232 lbs  - Follow-up plan:  - Clinic/Phone: graduate GDMT clinic  - Labs: PRN  Patient advised to keep fluid intake at 2 Liters (half-gallon)(64 ounces) per day and salt intake to 2 grams (2000 mg) per day or 500 mg or less per meal. Instructed patient to stay active and weigh daily and record, and bring that weight diary to your next appointment.   Patient instructed to call clinic if experiences increased dizziness/lightheadedness, SBPs <90 or HR < 50.   DISPOSITION   This video encounter was conducted with the patient's (or proxy's) verbal consent via secure, interactive audio and video telecommunications while in clinic/office/hospital.  The patient (or proxy) was instructed to have this encounter in a suitably private space and to only have persons present to whom they give permission to participate. In addition, patient identity was confirmed by use of name plus an additional identifier.  This visit was coded based on medical decision making (MDM).  This note has been created using automated tools and reviewed for accuracy by OKSANA ALEXANDROVNA KAMNEVA.  Niki Daring, PharmD Candidate  I confirm that this service has been performed by, Niki Daring, PharmD Candidate I reviewed the care furnished by the pharmacy resident during/or immediately after the visit. The extent of my participation was review of history and plan of care.   Theo Nash, PharmD, BCCP, [facility] Clinical Pharmacist Practioner   [facility] Call [phone] with questions. Fax  number is [phone]       *Some images could not be shown.

## 2024-01-10 ENCOUNTER — Emergency Department (HOSPITAL_COMMUNITY)
Admission: EM | Admit: 2024-01-10 | Discharge: 2024-01-10 | Disposition: A | Discharge status: Home / Self Care | Attending: Emergency Medicine | Admitting: Emergency Medicine

## 2024-01-10 ENCOUNTER — Encounter (HOSPITAL_COMMUNITY): Payer: Self-pay | Admitting: Emergency Medicine

## 2024-01-10 ENCOUNTER — Emergency Department (HOSPITAL_COMMUNITY)

## 2024-01-10 ENCOUNTER — Other Ambulatory Visit: Payer: Self-pay

## 2024-01-10 DIAGNOSIS — R55 Syncope and collapse: Secondary | ICD-10-CM | POA: Insufficient documentation

## 2024-01-10 DIAGNOSIS — I509 Heart failure, unspecified: Secondary | ICD-10-CM | POA: Insufficient documentation

## 2024-01-10 DIAGNOSIS — R000 Tachycardia, unspecified: Secondary | ICD-10-CM | POA: Diagnosis not present

## 2024-01-10 DIAGNOSIS — E860 Dehydration: Secondary | ICD-10-CM | POA: Diagnosis not present

## 2024-01-10 DIAGNOSIS — R42 Dizziness and giddiness: Secondary | ICD-10-CM | POA: Diagnosis not present

## 2024-01-10 DIAGNOSIS — I482 Chronic atrial fibrillation, unspecified: Secondary | ICD-10-CM | POA: Insufficient documentation

## 2024-01-10 DIAGNOSIS — Z7901 Long term (current) use of anticoagulants: Secondary | ICD-10-CM | POA: Insufficient documentation

## 2024-01-10 DIAGNOSIS — I4891 Unspecified atrial fibrillation: Secondary | ICD-10-CM | POA: Diagnosis not present

## 2024-01-10 LAB — CBC WITH DIFFERENTIAL/PLATELET
Abs Immature Granulocytes: 0.03 K/uL (ref 0.00–0.07)
Basophils Absolute: 0 K/uL (ref 0.0–0.1)
Basophils Relative: 0 %
Eosinophils Absolute: 0 K/uL (ref 0.0–0.5)
Eosinophils Relative: 0 %
HCT: 41.1 % (ref 39.0–52.0)
Hemoglobin: 13.9 g/dL (ref 13.0–17.0)
Immature Granulocytes: 0 %
Lymphocytes Relative: 13 %
Lymphs Abs: 1.2 K/uL (ref 0.7–4.0)
MCH: 35.9 pg — ABNORMAL HIGH (ref 26.0–34.0)
MCHC: 33.8 g/dL (ref 30.0–36.0)
MCV: 106.2 fL — ABNORMAL HIGH (ref 80.0–100.0)
Monocytes Absolute: 0.6 K/uL (ref 0.1–1.0)
Monocytes Relative: 7 %
Neutro Abs: 6.9 K/uL (ref 1.7–7.7)
Neutrophils Relative %: 80 %
Platelets: 141 K/uL — ABNORMAL LOW (ref 150–400)
RBC: 3.87 MIL/uL — ABNORMAL LOW (ref 4.22–5.81)
RDW: 14.1 % (ref 11.5–15.5)
WBC: 8.7 K/uL (ref 4.0–10.5)
nRBC: 0 % (ref 0.0–0.2)

## 2024-01-10 LAB — COMPREHENSIVE METABOLIC PANEL WITH GFR
ALT: 21 U/L (ref 0–44)
AST: 31 U/L (ref 15–41)
Albumin: 3.8 g/dL (ref 3.5–5.0)
Alkaline Phosphatase: 47 U/L (ref 38–126)
Anion gap: 13 (ref 5–15)
BUN: 27 mg/dL — ABNORMAL HIGH (ref 8–23)
CO2: 26 mmol/L (ref 22–32)
Calcium: 9.2 mg/dL (ref 8.9–10.3)
Chloride: 100 mmol/L (ref 98–111)
Creatinine, Ser: 1.58 mg/dL — ABNORMAL HIGH (ref 0.61–1.24)
GFR, Estimated: 44 mL/min — ABNORMAL LOW (ref >60)
Glucose, Bld: 122 mg/dL — ABNORMAL HIGH (ref 70–99)
Potassium: 4.1 mmol/L (ref 3.5–5.1)
Sodium: 139 mmol/L (ref 135–145)
Total Bilirubin: 0.8 mg/dL (ref 0.0–1.2)
Total Protein: 6.7 g/dL (ref 6.5–8.1)

## 2024-01-10 LAB — LACTIC ACID, PLASMA: Lactic Acid, Venous: 2 mmol/L (ref 0.5–1.9)

## 2024-01-10 LAB — TROPONIN I (HIGH SENSITIVITY): Troponin I (High Sensitivity): 7 ng/L (ref <18)

## 2024-01-10 LAB — BRAIN NATRIURETIC PEPTIDE: B Natriuretic Peptide: 92 pg/mL (ref 0.0–100.0)

## 2024-01-10 MED ORDER — SODIUM CHLORIDE 0.9 % IV BOLUS
500.0000 mL | Freq: Once | INTRAVENOUS | Status: DC
Start: 2024-01-10 — End: 2024-01-10

## 2024-01-10 MED ORDER — SODIUM CHLORIDE 0.9 % IV BOLUS
500.0000 mL | Freq: Once | INTRAVENOUS | Status: AC
  Administered 2024-01-10: 500 mL via INTRAVENOUS

## 2024-01-10 MED ORDER — SODIUM CHLORIDE 0.9 % IV BOLUS
1000.0000 mL | Freq: Once | INTRAVENOUS | Status: AC
  Administered 2024-01-10: 1000 mL via INTRAVENOUS

## 2024-01-10 NOTE — Discharge Instructions (Signed)
 Follow-up with your cardiologist about your atrial fibrillation

## 2024-01-10 NOTE — ED Triage Notes (Signed)
 Pt called ems due to dizziness when standing and feeling like he was gonna pass out. Ems arrived and pt's bp was 70/53.

## 2024-01-10 NOTE — ED Notes (Signed)
 Dr Suzette aware of pt orthostatic vitals-

## 2024-01-10 NOTE — ED Notes (Signed)
 ED Provider at bedside.

## 2024-01-11 NOTE — ED Provider Notes (Signed)
 [place] EMERGENCY DEPARTMENT AT [facility] Provider Note   CSN: [id number] Arrival date & time: 01/10/24  1826     Patient presents with: Near Syncope   Wayne Ortiz is a 78 y.o. male.   Patient has a history of congestive heart failure and atrial fibs.  He stated that he felt dizzy and weak his blood pressure was low  The history is provided by the patient and medical records. No language interpreter was used.  Near Syncope This is a recurrent problem. The current episode started 12 to 24 hours ago. The problem occurs rarely. The problem has not changed since onset.Pertinent negatives include no chest pain, no abdominal pain and no headaches. Nothing aggravates the symptoms. Nothing relieves the symptoms. He has tried nothing for the symptoms.       Prior to Admission medications   Medication Sig Start Date End Date Taking? Authorizing Provider  allopurinol  (ZYLOPRIM ) 300 MG tablet Take 300 mg by mouth every morning.     [provider]  chlorproMAZINE (THORAZINE) 10 MG tablet Take 10 mg by mouth.    [provider]  docusate sodium  (COLACE) 100 MG capsule Take 100-200 mg by mouth 2 (two) times daily as needed for constipation.    [provider]  ELIQUIS  5 MG TABS tablet TAKE 1 TABLET BY MOUTH TWICE A DAY Patient taking differently: Take 5 mg by mouth 2 (two) times daily. 02/03/18   Claudene Victory ORN, MD  famotidine  (PEPCID ) 20 MG tablet One after supper 09/24/21   Darlean Ozell NOVAK, MD  furosemide  (LASIX ) 40 MG tablet TAKE ONE TABLET BY MOUTH DAILY,MAY TAKE EXTRA TAB DAILY AS NEEDED FOR WEIGHT GAIN/SWELLING Patient taking differently: Take 40 mg by mouth daily. 11/17/16   Dow Arland BROCKS, NP  gabapentin  (NEURONTIN ) 300 MG capsule Take 900 mg by mouth at bedtime. 12/13/20   [provider]  HYDROcodone -acetaminophen  (NORCO) 10-325 MG tablet Take 1 tablet by mouth See admin instructions. Take 1 tablet by mouth every 6 to 8 hours as needed  for pain. 04/16/21   [provider]  metoprolol  tartrate (LOPRESSOR ) 25 MG tablet Take 12.5 mg by mouth 2 (two) times daily. 04/26/21   [provider]  pantoprazole  (PROTONIX ) 40 MG tablet TAKE 1 TABLET (40 MG TOTAL) BY MOUTH DAILY. TAKE 30-60 MIN BEFORE FIRST MEAL OF THE DAY 06/10/22   Darlean Ozell NOVAK, MD  SYNTHROID  112 MCG tablet Take 112 mcg by mouth daily at 2 am. 12/03/20   [provider]    Allergies: Amiodarone , Atorvastatin, Dilaudid  [hydromorphone ], Hydrocodone , Lisinopril , Metoprolol , Rosuvastatin, Diltiazem , and Rocephin [ceftriaxone sodium in dextrose ]    Review of Systems  Constitutional:  Negative for appetite change and fatigue.  HENT:  Negative for congestion, ear discharge and sinus pressure.   Eyes:  Negative for discharge.  Respiratory:  Negative for cough.   Cardiovascular:  Positive for near-syncope. Negative for chest pain.  Gastrointestinal:  Negative for abdominal pain and diarrhea.  Genitourinary:  Negative for frequency and hematuria.  Musculoskeletal:  Negative for back pain.  Skin:  Negative for rash.  Neurological:  Positive for dizziness. Negative for seizures and headaches.  Psychiatric/Behavioral:  Negative for hallucinations.     Updated Vital Signs BP 119/85   Pulse 97   Temp 98.2 F (36.8 C)   Resp 18   Ht 5' 11 (1.803 m)   Wt 118 kg   SpO2 95%   BMI 36.28 kg/m   Physical Exam Vitals  and nursing note reviewed.  Constitutional:      Appearance: He is well-developed.  HENT:     Head: Normocephalic.     Nose: Nose normal.  Eyes:     General: No scleral icterus.    Conjunctiva/sclera: Conjunctivae normal.  Neck:     Thyroid : No thyromegaly.  Cardiovascular:     Rate and Rhythm: Normal rate. Rhythm irregular.     Heart sounds: No murmur heard.    No friction rub. No gallop.  Pulmonary:     Breath sounds: No stridor. No wheezing or rales.  Chest:     Chest wall: No tenderness.  Abdominal:     General:  There is no distension.     Tenderness: There is no abdominal tenderness. There is no rebound.  Musculoskeletal:        General: Normal range of motion.     Cervical back: Neck supple.  Lymphadenopathy:     Cervical: No cervical adenopathy.  Skin:    Findings: No erythema or rash.  Neurological:     Mental Status: He is alert and oriented to person, place, and time.     Motor: No abnormal muscle tone.     Coordination: Coordination normal.  Psychiatric:        Behavior: Behavior normal.     (all labs ordered are listed, but only abnormal results are displayed) Labs Reviewed  CBC WITH DIFFERENTIAL/PLATELET - Abnormal; Notable for the following components:      Result Value   RBC 3.87 (*)    MCV 106.2 (*)    MCH 35.9 (*)    Platelets 141 (*)    All other components within normal limits  COMPREHENSIVE METABOLIC PANEL WITH GFR - Abnormal; Notable for the following components:   Glucose, Bld 122 (*)    BUN 27 (*)    Creatinine, Ser 1.58 (*)    GFR, Estimated 44 (*)    All other components within normal limits  LACTIC ACID, PLASMA - Abnormal; Notable for the following components:   Lactic Acid, Venous 2.0 (*)    All other components within normal limits  BRAIN NATRIURETIC PEPTIDE  TROPONIN I (HIGH SENSITIVITY)    EKG: None  Radiology: DG Chest Port 1 View Result Date: 01/10/2024 CLINICAL DATA:  weak Pt called ems due to dizziness when standing and feeling like he was gonna pass out. Ems arrived and pt's bp was 70/53. EXAM: PORTABLE CHEST 1 VIEW COMPARISON:  Chest x-ray 09/24/2021 FINDINGS: The heart and mediastinal contours are unchanged. Atherosclerotic plaque. No focal consolidation. No pulmonary edema. No pleural effusion. No pneumothorax. No acute osseous abnormality. Cervicothoracic spine surgical hardware. IMPRESSION: No active disease. Electronically Signed   By: Morgane  Naveau M.D.   On: 01/10/2024 19:15     Procedures   Medications Ordered in the ED  sodium  chloride 0.9 % bolus 1,000 mL (0 mLs Intravenous Stopped 01/10/24 2020)  sodium chloride  0.9 % bolus 500 mL (0 mLs Intravenous Stopped 01/10/24 2112)                                    Medical Decision Making Amount and/or Complexity of Data Reviewed Labs: ordered. Radiology: ordered.   Patient in atrial fibrillation.  And he is dehydrated.  Patient was not orthostatic after 1 L of saline and was given another 500 cc and he will follow-up with his PCP and his cardiologist  Final diagnoses:  Near syncope  Dehydration  Chronic atrial fibrillation [facility])    ED Discharge Orders     None          Suzette Pac, MD 01/11/24 1207

## 2024-01-12 DIAGNOSIS — I509 Heart failure, unspecified: Secondary | ICD-10-CM | POA: Diagnosis not present

## 2024-01-12 DIAGNOSIS — I480 Paroxysmal atrial fibrillation: Secondary | ICD-10-CM | POA: Diagnosis not present

## 2024-01-13 DIAGNOSIS — Z9889 Other specified postprocedural states: Secondary | ICD-10-CM | POA: Diagnosis not present

## 2024-01-13 DIAGNOSIS — I10 Essential (primary) hypertension: Secondary | ICD-10-CM | POA: Diagnosis not present

## 2024-01-13 DIAGNOSIS — E039 Hypothyroidism, unspecified: Secondary | ICD-10-CM | POA: Diagnosis not present

## 2024-01-13 DIAGNOSIS — R7303 Prediabetes: Secondary | ICD-10-CM | POA: Diagnosis not present

## 2024-01-13 DIAGNOSIS — I2510 Atherosclerotic heart disease of native coronary artery without angina pectoris: Secondary | ICD-10-CM | POA: Diagnosis not present

## 2024-01-13 DIAGNOSIS — N289 Disorder of kidney and ureter, unspecified: Secondary | ICD-10-CM | POA: Diagnosis not present

## 2024-01-13 DIAGNOSIS — R972 Elevated prostate specific antigen [PSA]: Secondary | ICD-10-CM | POA: Diagnosis not present

## 2024-01-13 DIAGNOSIS — Z131 Encounter for screening for diabetes mellitus: Secondary | ICD-10-CM | POA: Diagnosis not present

## 2024-01-13 DIAGNOSIS — R42 Dizziness and giddiness: Secondary | ICD-10-CM | POA: Diagnosis not present

## 2024-01-13 DIAGNOSIS — D696 Thrombocytopenia, unspecified: Secondary | ICD-10-CM | POA: Diagnosis not present

## 2024-01-13 DIAGNOSIS — E782 Mixed hyperlipidemia: Secondary | ICD-10-CM | POA: Diagnosis not present

## 2024-01-13 DIAGNOSIS — I482 Chronic atrial fibrillation, unspecified: Secondary | ICD-10-CM | POA: Diagnosis not present

## 2024-01-13 DIAGNOSIS — Z8673 Personal history of transient ischemic attack (TIA), and cerebral infarction without residual deficits: Secondary | ICD-10-CM | POA: Diagnosis not present

## 2024-01-13 DIAGNOSIS — G629 Polyneuropathy, unspecified: Secondary | ICD-10-CM | POA: Diagnosis not present

## 2024-01-13 DIAGNOSIS — R55 Syncope and collapse: Secondary | ICD-10-CM | POA: Diagnosis not present

## 2024-01-15 DIAGNOSIS — I480 Paroxysmal atrial fibrillation: Secondary | ICD-10-CM | POA: Diagnosis not present

## 2024-01-18 DIAGNOSIS — M62838 Other muscle spasm: Secondary | ICD-10-CM | POA: Diagnosis not present

## 2024-01-18 DIAGNOSIS — M25562 Pain in left knee: Secondary | ICD-10-CM | POA: Diagnosis not present

## 2024-01-19 DIAGNOSIS — Z85828 Personal history of other malignant neoplasm of skin: Secondary | ICD-10-CM | POA: Diagnosis not present

## 2024-01-19 DIAGNOSIS — L578 Other skin changes due to chronic exposure to nonionizing radiation: Secondary | ICD-10-CM | POA: Diagnosis not present

## 2024-01-19 DIAGNOSIS — Z1283 Encounter for screening for malignant neoplasm of skin: Secondary | ICD-10-CM | POA: Diagnosis not present

## 2024-01-19 DIAGNOSIS — L821 Other seborrheic keratosis: Secondary | ICD-10-CM | POA: Diagnosis not present

## 2024-01-19 DIAGNOSIS — D235 Other benign neoplasm of skin of trunk: Secondary | ICD-10-CM | POA: Diagnosis not present

## 2024-01-19 DIAGNOSIS — Z08 Encounter for follow-up examination after completed treatment for malignant neoplasm: Secondary | ICD-10-CM | POA: Diagnosis not present

## 2024-01-19 DIAGNOSIS — S7012XA Contusion of left thigh, initial encounter: Secondary | ICD-10-CM | POA: Diagnosis not present

## 2024-01-19 DIAGNOSIS — Z789 Other specified health status: Secondary | ICD-10-CM | POA: Diagnosis not present

## 2024-01-19 DIAGNOSIS — L2989 Other pruritus: Secondary | ICD-10-CM | POA: Diagnosis not present

## 2024-01-19 DIAGNOSIS — L538 Other specified erythematous conditions: Secondary | ICD-10-CM | POA: Diagnosis not present

## 2024-01-19 DIAGNOSIS — D234 Other benign neoplasm of skin of scalp and neck: Secondary | ICD-10-CM | POA: Diagnosis not present

## 2024-01-20 ENCOUNTER — Encounter: Payer: Self-pay | Admitting: Emergency Medicine

## 2024-01-20 ENCOUNTER — Ambulatory Visit
Admission: EM | Admit: 2024-01-20 | Discharge: 2024-01-20 | Disposition: A | Discharge status: Home / Self Care | Attending: Family Medicine | Admitting: Family Medicine

## 2024-01-20 ENCOUNTER — Ambulatory Visit (INDEPENDENT_AMBULATORY_CARE_PROVIDER_SITE_OTHER)

## 2024-01-20 DIAGNOSIS — Z96652 Presence of left artificial knee joint: Secondary | ICD-10-CM | POA: Diagnosis not present

## 2024-01-20 DIAGNOSIS — S72302A Unspecified fracture of shaft of left femur, initial encounter for closed fracture: Secondary | ICD-10-CM | POA: Diagnosis not present

## 2024-01-20 DIAGNOSIS — T148XXA Other injury of unspecified body region, initial encounter: Secondary | ICD-10-CM | POA: Diagnosis not present

## 2024-01-20 DIAGNOSIS — M79605 Pain in left leg: Secondary | ICD-10-CM | POA: Diagnosis not present

## 2024-01-20 NOTE — ED Triage Notes (Signed)
 Kicked by horse 1 week ago in left upper leg.  States left hip has been hurting and feels pain all the way to his knee. Bruising noted to area

## 2024-01-23 NOTE — ED Provider Notes (Signed)
 [facility]    CSN: [id number] Arrival date & time: 01/20/24  1208      History   Chief Complaint No chief complaint on file.   HPI VIAAN KNIPPENBERG is a 78 y.o. male.   Presenting today with left thigh pain and bruising after being kicked to this area by a horse x 1 week. Denies loss of ROM, numbness, tingling, skin tears. So far trying OTC remedies with good relief but having soreness from left hip to left knee and bruising is now extending to knee. Pt does take eliquis  daily. Hx of femur fracture with hardware in place and total knee replacement.     Past Medical History:  Diagnosis Date   Arthritis    Atrial fibrillation (HCC)    BPH (benign prostatic hypertrophy)    CAD (coronary artery disease)    a. s/p DES 05/2014 at [facility]   Cancer [facility])    Complication of anesthesia    BECAME COMBATIVE   Dyspnea    Dysrhythmia    Femur fracture (HCC)    GERD (gastroesophageal reflux disease)    occasional TUMS   Gout    History of gout    History of kidney stones    Hypertension    Hypothyroidism    Persistent atrial fibrillation (HCC)    Precancerous skin lesion    Renal disorder     Patient Active Problem List   Diagnosis Date Noted   Nocturnal hypoxemia 04/13/2022   Shock (HCC) 05/21/2021   History of left-sided carotid endarterectomy 05/21/2021   IPF (idiopathic pulmonary fibrosis) (HCC) 05/21/2021   TIA (transient ischemic attack) 05/17/2021   S/P total knee arthroplasty, left 03/05/2021   Liver abscess- s/p JP drain 03/02/2019   Essential hypertension 12/09/2014   Morbid obesity due to excess calories (HCC) 12/09/2014   Hypothyroidism 12/09/2014   DOE (dyspnea on exertion) 12/09/2014   Chronic anticoagulation-Eliquis  12/09/2014   Persistent atrial fibrillation (HCC) 12/07/2014   CAD S/P RCA and CFX DES Feb 2016 09/13/2014    Past Surgical History:  Procedure Laterality Date   arm fracture surgery Left    CARDIOVERSION [name] 12/09/2014   Procedure:  CARDIOVERSION;  Surgeon: Oneil JAYSON Parchment, MD;  Location: [facility] OR;  Service: Cardiovascular;  Laterality: [name];   CARDIOVERSION [name] 05/05/2016   Procedure: CARDIOVERSION;  Surgeon: Ezra GORMAN Shuck, MD;  Location: [facility] ENDOSCOPY;  Service: Cardiovascular;  Laterality: [name];   CARDIOVERSION [name] 08/22/2016   Procedure: CARDIOVERSION;  Surgeon: Redell GORMAN Shallow, MD;  Location: [facility] ENDOSCOPY;  Service: Cardiovascular;  Laterality: [name];   CATARACTS     EXCISION   CERVICAL SPINE SURGERY     CHOLECYSTECTOMY [name] 01/03/2013   Procedure: LAPAROSCOPIC CHOLECYSTECTOMY;  Surgeon: Oneil DELENA Budge, MD;  Location: [name];  Service: General;  Laterality: [name];   ELECTROPHYSIOLOGIC STUDY [name] 03/15/2015   Procedure: Atrial Fibrillation Ablation;  Surgeon: Lynwood Rakers, MD;  Location: [facility] INVASIVE CV LAB;  Service: Cardiovascular;  Laterality: [name];   ELECTROPHYSIOLOGIC STUDY [name] 04/01/2016   Procedure: Atrial Fibrillation Ablation;  Surgeon: Lynwood Rakers, MD;  Location: [facility] INVASIVE CV LAB;  Service: Cardiovascular;  Laterality: [name];   ENDARTERECTOMY Left 05/21/2021   Procedure: LEFT CAROTID ENDARTERECTOMY AND RESECTION OF LEFT COMMON REDUNDANT CAROTID ARTERY;  Surgeon: Eliza Lonni GORMAN, MD;  Location: [facility] OR;  Service: Vascular;  Laterality: Left;   ERCP [name] 01/02/2013   Procedure: ENDOSCOPIC RETROGRADE CHOLANGIOPANCREATOGRAPHY (ERCP);  Surgeon: Claudis RAYMOND Rivet, MD;  Location: [name];  Service: Endoscopy;  Laterality: [name];   FEMUR FRACTURE SURGERY     LIVER BIOPSY [name] 01/03/2013   Procedure: LIVER BIOPSY;  Surgeon: Oneil DELENA Budge, MD;  Location: [name];  Service: General;  Laterality: [name];   PATCH ANGIOPLASTY Left 05/21/2021   Procedure: PATCH ANGIOPLASTY USING GEORGE BIOLOGIC PATCH;  Surgeon: Eliza Lonni RAMAN, MD;  Location[facility] OR;  Service: Vascular;  Laterality: Left;   RIGHT/LEFT HEART CATH AND CORONARY ANGIOGRAPHY [name] 07/04/2016   Procedure: Right/Left Heart Cath and Coronary Angiography;  Surgeon: Victory LELON Sharps, MD;   Location: [facility] INVASIVE CV LAB;  Service: Cardiovascular;  Laterality: [name];   SPHINCTEROTOMY [name] 01/02/2013   Procedure: SPHINCTEROTOMY;  Surgeon: Claudis RAYMOND Rivet, MD;  Location: [name];  Service: Endoscopy;  Laterality: [name];  Stone Extraction   TEE WITHOUT CARDIOVERSION [name] 08/22/2016   Procedure: TRANSESOPHAGEAL ECHOCARDIOGRAM (TEE);  Surgeon: Redell RAMAN Shallow, MD;  Location: [facility] ENDOSCOPY;  Service: Cardiovascular;  Laterality: [name];   TOTAL KNEE ARTHROPLASTY Right 05/30/2013   Procedure: RIGHT TOTAL KNEE ARTHROPLASTY;  Surgeon: Donnice JONETTA Car, MD;  Location: [name];  Service: Orthopedics;  Laterality: Right;   TOTAL KNEE ARTHROPLASTY Left 03/05/2021   Procedure: TOTAL KNEE ARTHROPLASTY;  Surgeon: Car Donnice, MD;  Location: [name];  Service: Orthopedics;  Laterality: Left;       Home Medications    Prior to Admission medications   Medication Sig Start Date End Date Taking? Authorizing Provider  allopurinol  (ZYLOPRIM ) 300 MG tablet Take 300 mg by mouth every morning.     [provider]  chlorproMAZINE (THORAZINE) 10 MG tablet Take 10 mg by mouth.    [provider]  docusate sodium  (COLACE) 100 MG capsule Take 100-200 mg by mouth 2 (two) times daily as needed for constipation.    [provider]  ELIQUIS  5 MG TABS tablet TAKE 1 TABLET BY MOUTH TWICE A DAY Patient taking differently: Take 5 mg by mouth 2 (two) times daily. 02/03/18   Sharps Victory LELON, MD  famotidine  (PEPCID ) 20 MG tablet One after supper 09/24/21   Darlean Ozell NOVAK, MD  furosemide  (LASIX ) 40 MG tablet TAKE ONE TABLET BY MOUTH DAILY,MAY TAKE EXTRA TAB DAILY AS NEEDED FOR WEIGHT GAIN/SWELLING Patient taking differently: Take 40 mg by mouth daily. 11/17/16   Dow Arland BROCKS, NP  gabapentin  (NEURONTIN ) 300 MG capsule Take 900 mg by mouth at bedtime. 12/13/20   [provider]  HYDROcodone -acetaminophen  (NORCO) 10-325 MG tablet Take 1 tablet by mouth See admin instructions. Take 1 tablet by mouth every  6 to 8 hours as needed for pain. 04/16/21   [provider]  metoprolol  tartrate (LOPRESSOR ) 25 MG tablet Take 12.5 mg by mouth 2 (two) times daily. 04/26/21   [provider]  pantoprazole  (PROTONIX ) 40 MG tablet TAKE 1 TABLET (40 MG TOTAL) BY MOUTH DAILY. TAKE 30-60 MIN BEFORE FIRST MEAL OF THE DAY 06/10/22   Darlean Ozell NOVAK, MD  SYNTHROID  112 MCG tablet Take 112 mcg by mouth daily at 2 am. 12/03/20   [provider]    Family History Family History  Problem Relation Age of Onset   Kidney Stones Mother     Social History Social History   Tobacco Use   Smoking status: Former    Current packs/day: 0.00    Average packs/day: 1 pack/day for 20.0 years (20.0 ttl pk-yrs)    Types: Cigarettes    Start date: 05/23/1953    Quit date: 05/23/1973    Years since quitting: 50.7  Smokeless tobacco: Never  Vaping Use   Vaping status: Never Used  Substance Use Topics   Alcohol use: Not Currently   Drug use: No     Allergies   Amiodarone , Atorvastatin, Dilaudid  [hydromorphone ], Hydrocodone , Lisinopril , Metoprolol , Rosuvastatin, Diltiazem , and Rocephin [ceftriaxone sodium in dextrose ]   Review of Systems Review of Systems PER HPI  Physical Exam Triage Vital Signs ED Triage Vitals  Encounter Vitals Group     BP 01/20/24 1321 108/71     Girls Systolic BP Percentile --      Girls Diastolic BP Percentile --      Boys Systolic BP Percentile --      Boys Diastolic BP Percentile --      Pulse Rate 01/20/24 1321 72     Resp 01/20/24 1321 18     Temp 01/20/24 1321 97.7 F (36.5 C)     Temp Source 01/20/24 1321 Oral     SpO2 01/20/24 1321 93 %     Weight --      Height --      Head Circumference --      Peak Flow --      Pain Score 01/20/24 1323 6     Pain Loc --      Pain Education --      Exclude from Growth Chart --    No data found.  Updated Vital Signs BP 108/71 (BP Location: Right Arm)   Pulse 72   Temp 97.7 F (36.5 C) (Oral)   Resp 18    SpO2 93%   Visual Acuity Right Eye Distance:   Left Eye Distance:   Bilateral Distance:    Right Eye Near:   Left Eye Near:    Bilateral Near:     Physical Exam Vitals and nursing note reviewed.  Constitutional:      Appearance: Normal appearance.  HENT:     Head: Atraumatic.  Eyes:     Extraocular Movements: Extraocular movements intact.     Conjunctiva/sclera: Conjunctivae normal.  Cardiovascular:     Rate and Rhythm: Normal rate.  Pulmonary:     Effort: Pulmonary effort is normal.  Musculoskeletal:        General: Swelling and tenderness present. Normal range of motion.     Cervical back: Normal range of motion and neck supple.     Comments: Localized hematoma to left thigh with bruising down toward posterior left knee. No bony deformities palpable to LLE and good strength and ROM throughout leg.   Skin:    General: Skin is warm and dry.  Neurological:     Mental Status: He is oriented to person, place, and time.     Comments: LLE neurovascularly intact  Psychiatric:        Mood and Affect: Mood normal.        Thought Content: Thought content normal.        Judgment: Judgment normal.      UC Treatments / Results  Labs (all labs ordered are listed, but only abnormal results are displayed) Labs Reviewed - No data to display  EKG   Radiology No results found.  Procedures Procedures (including critical care time)  Medications Ordered in UC Medications - No data to display  Initial Impression / Assessment and Plan / UC Course  I have reviewed the triage vital signs and the nursing notes.  Pertinent labs & imaging results that were available during my care of the patient were reviewed by me and considered in  my medical decision making (see chart for details).     Xray of left femur neg for acute bony abnormality and exam very reassuring for hematoma, otherwise appears very well. Discussed RICE, OTC pain relievers. Return precautions reviewed  Final  Clinical Impressions(s) / UC Diagnoses   Final diagnoses:  Left leg pain  Hematoma   Discharge Instructions   None    ED Prescriptions   None    PDMP not reviewed this encounter.   Stuart Vernell Norris, PA-C 01/23/24 1423

## 2024-02-17 DIAGNOSIS — M62838 Other muscle spasm: Secondary | ICD-10-CM | POA: Diagnosis not present

## 2024-02-17 DIAGNOSIS — M25562 Pain in left knee: Secondary | ICD-10-CM | POA: Diagnosis not present

## 2024-03-07 DIAGNOSIS — Z6833 Body mass index (BMI) 33.0-33.9, adult: Secondary | ICD-10-CM | POA: Diagnosis not present

## 2024-03-07 DIAGNOSIS — Z79899 Other long term (current) drug therapy: Secondary | ICD-10-CM | POA: Diagnosis not present

## 2024-03-07 DIAGNOSIS — I10 Essential (primary) hypertension: Secondary | ICD-10-CM | POA: Diagnosis not present

## 2024-03-07 DIAGNOSIS — E785 Hyperlipidemia, unspecified: Secondary | ICD-10-CM | POA: Diagnosis not present

## 2024-03-07 DIAGNOSIS — I2510 Atherosclerotic heart disease of native coronary artery without angina pectoris: Secondary | ICD-10-CM | POA: Diagnosis not present

## 2024-03-07 DIAGNOSIS — I5032 Chronic diastolic (congestive) heart failure: Secondary | ICD-10-CM | POA: Diagnosis not present

## 2024-03-07 DIAGNOSIS — N400 Enlarged prostate without lower urinary tract symptoms: Secondary | ICD-10-CM | POA: Diagnosis not present

## 2024-03-07 DIAGNOSIS — Z8673 Personal history of transient ischemic attack (TIA), and cerebral infarction without residual deficits: Secondary | ICD-10-CM | POA: Diagnosis not present

## 2024-03-07 DIAGNOSIS — Z87891 Personal history of nicotine dependence: Secondary | ICD-10-CM | POA: Diagnosis not present

## 2024-03-07 DIAGNOSIS — Z8616 Personal history of COVID-19: Secondary | ICD-10-CM | POA: Diagnosis not present

## 2024-03-07 DIAGNOSIS — Z9889 Other specified postprocedural states: Secondary | ICD-10-CM | POA: Diagnosis not present

## 2024-03-07 DIAGNOSIS — Z5181 Encounter for therapeutic drug level monitoring: Secondary | ICD-10-CM | POA: Diagnosis not present

## 2024-03-07 DIAGNOSIS — M109 Gout, unspecified: Secondary | ICD-10-CM | POA: Diagnosis not present

## 2024-03-07 DIAGNOSIS — Z8679 Personal history of other diseases of the circulatory system: Secondary | ICD-10-CM | POA: Diagnosis not present

## 2024-03-07 DIAGNOSIS — I440 Atrioventricular block, first degree: Secondary | ICD-10-CM | POA: Diagnosis not present

## 2024-03-07 DIAGNOSIS — Z9861 Coronary angioplasty status: Secondary | ICD-10-CM | POA: Diagnosis not present

## 2024-03-07 DIAGNOSIS — I480 Paroxysmal atrial fibrillation: Secondary | ICD-10-CM | POA: Diagnosis not present

## 2024-03-07 DIAGNOSIS — Z7901 Long term (current) use of anticoagulants: Secondary | ICD-10-CM | POA: Diagnosis not present

## 2024-03-07 DIAGNOSIS — Z885 Allergy status to narcotic agent status: Secondary | ICD-10-CM | POA: Diagnosis not present

## 2024-03-07 DIAGNOSIS — G453 Amaurosis fugax: Secondary | ICD-10-CM | POA: Diagnosis not present

## 2024-03-07 DIAGNOSIS — I255 Ischemic cardiomyopathy: Secondary | ICD-10-CM | POA: Diagnosis not present

## 2024-03-07 DIAGNOSIS — I110 Hypertensive heart disease with heart failure: Secondary | ICD-10-CM | POA: Diagnosis not present

## 2024-03-07 DIAGNOSIS — E669 Obesity, unspecified: Secondary | ICD-10-CM | POA: Diagnosis not present

## 2024-03-07 DIAGNOSIS — I491 Atrial premature depolarization: Secondary | ICD-10-CM | POA: Diagnosis not present

## 2024-03-07 DIAGNOSIS — Z85828 Personal history of other malignant neoplasm of skin: Secondary | ICD-10-CM | POA: Diagnosis not present

## 2024-03-07 DIAGNOSIS — Z881 Allergy status to other antibiotic agents status: Secondary | ICD-10-CM | POA: Diagnosis not present

## 2024-03-07 DIAGNOSIS — Z888 Allergy status to other drugs, medicaments and biological substances status: Secondary | ICD-10-CM | POA: Diagnosis not present

## 2024-03-10 ENCOUNTER — Telehealth: Payer: Self-pay

## 2024-03-10 NOTE — Transitions of Care (Post Inpatient/ED Visit) (Signed)
   03/10/2024  Name: Wayne Ortiz MRN: [id number] DOB: 1945/06/05  Today's TOC FU Call Status: Today's TOC FU Call Status:: Unsuccessful Call (1st Attempt) Unsuccessful Call (1st Attempt) Date: 03/10/24 (spoke with son also named Alm Sar and states to call patient at the home number listed.)  No VM and unable to leave a message.   Attempted to reach the patient regarding the most recent Inpatient/ED visit.  Follow Up Plan: Additional outreach attempts will be made to reach the patient to complete the Transitions of Care (Post Inpatient/ED visit) call.   Alan Ee, RN, BSN, CEN [employer]- Transition of Care Team.  [facility] [phone]

## 2024-03-11 ENCOUNTER — Telehealth: Payer: Self-pay | Admitting: *Deleted

## 2024-03-11 NOTE — Transitions of Care (Post Inpatient/ED Visit) (Signed)
   03/11/2024  Name: Wayne Ortiz MRN: [id number] DOB: February 26, 1946  Today's TOC FU Call Status: Today's TOC FU Call Status:: Unsuccessful Call (2nd Attempt) Unsuccessful Call (2nd Attempt) Date: 03/11/24  Attempted to reach the patient regarding the most recent Inpatient/ED visit. RN spoke with son [name] that gave me patient home number. Per son [name] he had called and dad didn't answer he thought he may have been outside. RN attempted the home number. Patient did not answer.   Follow Up Plan: Additional outreach attempts will be made to reach the patient to complete the Transitions of Care (Post Inpatient/ED visit) call.   Cathlean Headland BSN RN [facility] Health Care Management Coordinator [email or web address] Direct Dial: [phone]  Fax: [phone] Website: [email or web address]

## 2024-03-14 ENCOUNTER — Telehealth: Payer: Self-pay

## 2024-03-14 NOTE — Transitions of Care (Post Inpatient/ED Visit) (Signed)
   03/14/2024  Name: Wayne Ortiz MRN: [id number] DOB: [birth year]  Today's TOC FU Call Status: Today's TOC FU Call Status:: Unsuccessful Call (3rd Attempt) Unsuccessful Call (3rd Attempt) Date: 03/14/24  Attempted to reach the patient regarding the most recent Inpatient/ED visit.  Follow Up Plan: No further outreach attempts will be made at this time. We have been unable to contact the patient.  Dandria Griego J. Angee Gupton RN, MSN [facility], [facility] RN Care Manager Direct Dial: [phone]  Fax: [phone] Website: [email or web address]

## 2024-03-17 DIAGNOSIS — F1120 Opioid dependence, uncomplicated: Secondary | ICD-10-CM | POA: Diagnosis not present

## 2024-03-17 DIAGNOSIS — M792 Neuralgia and neuritis, unspecified: Secondary | ICD-10-CM | POA: Diagnosis not present

## 2024-03-17 DIAGNOSIS — M51362 Other intervertebral disc degeneration, lumbar region with discogenic back pain and lower extremity pain: Secondary | ICD-10-CM | POA: Diagnosis not present

## 2024-03-29 DIAGNOSIS — I482 Chronic atrial fibrillation, unspecified: Secondary | ICD-10-CM | POA: Diagnosis not present
